# Patient Record
Sex: Female | Born: 1950 | Race: Black or African American | Hispanic: No | Smoking: Never smoker
Health system: Southern US, Community
[De-identification: ages and names within clinical notes are randomized; demographics above are authoritative.]

## PROBLEM LIST (undated history)

## (undated) DIAGNOSIS — N3281 Overactive bladder: Secondary | ICD-10-CM

## (undated) DIAGNOSIS — R809 Proteinuria, unspecified: Secondary | ICD-10-CM

## (undated) DIAGNOSIS — I10 Essential (primary) hypertension: Secondary | ICD-10-CM

## (undated) DIAGNOSIS — I82409 Acute embolism and thrombosis of unspecified deep veins of unspecified lower extremity: Secondary | ICD-10-CM

## (undated) DIAGNOSIS — I7300 Raynaud's syndrome without gangrene: Secondary | ICD-10-CM

## (undated) DIAGNOSIS — Z780 Asymptomatic menopausal state: Secondary | ICD-10-CM

## (undated) DIAGNOSIS — D6861 Antiphospholipid syndrome: Secondary | ICD-10-CM

## (undated) HISTORY — DX: Acute embolism and thrombosis of unspecified deep veins of unspecified lower extremity: I82.409

## (undated) HISTORY — DX: Overactive bladder: N32.81

## (undated) HISTORY — DX: Asymptomatic menopausal state: Z78.0

## (undated) HISTORY — DX: Essential (primary) hypertension: I10

## (undated) HISTORY — DX: Raynaud's syndrome without gangrene: I73.00

## (undated) HISTORY — DX: Antiphospholipid syndrome: D68.61

## (undated) HISTORY — DX: Proteinuria, unspecified: R80.9

---

## 1999-01-07 HISTORY — PX: TOTAL ABDOMINAL HYSTERECTOMY: SHX209

## 2007-11-30 ENCOUNTER — Ambulatory Visit: Payer: Self-pay | Admitting: Family Medicine

## 2007-11-30 DIAGNOSIS — R809 Proteinuria, unspecified: Secondary | ICD-10-CM | POA: Insufficient documentation

## 2007-11-30 DIAGNOSIS — M130 Polyarthritis, unspecified: Secondary | ICD-10-CM | POA: Insufficient documentation

## 2007-11-30 DIAGNOSIS — I10 Essential (primary) hypertension: Secondary | ICD-10-CM | POA: Insufficient documentation

## 2007-11-30 LAB — CONVERTED CEMR LAB
Ketones, urine, test strip: NEGATIVE
Nitrite: NEGATIVE
Protein, U semiquant: >=300
Urobilinogen, UA: 0.2

## 2007-12-03 ENCOUNTER — Encounter: Payer: Self-pay | Admitting: Family Medicine

## 2007-12-03 ENCOUNTER — Telehealth: Payer: Self-pay | Admitting: Family Medicine

## 2007-12-03 LAB — CONVERTED CEMR LAB
ANA Titer 1: 1:640 {titer} — ABNORMAL HIGH
CO2: 22 meq/L (ref 19–32)
Calcium: 9.8 mg/dL (ref 8.4–10.5)
Chloride: 100 meq/L (ref 96–112)
Glucose, Bld: 134 mg/dL — ABNORMAL HIGH (ref 70–99)
Potassium: 3.6 meq/L (ref 3.5–5.3)
Sodium: 140 meq/L (ref 135–145)

## 2007-12-06 DIAGNOSIS — M329 Systemic lupus erythematosus, unspecified: Secondary | ICD-10-CM | POA: Insufficient documentation

## 2007-12-07 LAB — CONVERTED CEMR LAB: ds DNA Ab: 16 — ABNORMAL HIGH (ref <5)

## 2007-12-27 ENCOUNTER — Encounter: Payer: Self-pay | Admitting: Family Medicine

## 2008-02-01 ENCOUNTER — Ambulatory Visit: Payer: Self-pay | Admitting: Family Medicine

## 2008-02-01 DIAGNOSIS — I7300 Raynaud's syndrome without gangrene: Secondary | ICD-10-CM | POA: Insufficient documentation

## 2008-02-03 LAB — CONVERTED CEMR LAB
Chloride: 104 meq/L (ref 96–112)
Potassium: 4 meq/L (ref 3.5–5.3)
Sodium: 143 meq/L (ref 135–145)

## 2008-03-20 ENCOUNTER — Telehealth (INDEPENDENT_AMBULATORY_CARE_PROVIDER_SITE_OTHER): Payer: Self-pay | Admitting: *Deleted

## 2008-03-22 ENCOUNTER — Ambulatory Visit: Payer: Self-pay | Admitting: Family Medicine

## 2008-03-22 DIAGNOSIS — R079 Chest pain, unspecified: Secondary | ICD-10-CM | POA: Insufficient documentation

## 2008-03-22 DIAGNOSIS — R002 Palpitations: Secondary | ICD-10-CM | POA: Insufficient documentation

## 2008-03-22 DIAGNOSIS — R42 Dizziness and giddiness: Secondary | ICD-10-CM | POA: Insufficient documentation

## 2008-03-23 ENCOUNTER — Encounter: Payer: Self-pay | Admitting: Family Medicine

## 2008-03-24 ENCOUNTER — Encounter: Payer: Self-pay | Admitting: Family Medicine

## 2008-03-24 LAB — CONVERTED CEMR LAB
HCT: 30 % — ABNORMAL LOW (ref 36.0–46.0)
Hemoglobin: 9.9 g/dL — ABNORMAL LOW (ref 12.0–15.0)
MCHC: 33 g/dL (ref 30.0–36.0)
MCV: 81.3 fL (ref 78.0–100.0)
RBC: 3.69 M/uL — ABNORMAL LOW (ref 3.87–5.11)
WBC: 3.7 10*3/uL — ABNORMAL LOW (ref 4.0–10.5)

## 2008-03-27 DIAGNOSIS — D649 Anemia, unspecified: Secondary | ICD-10-CM | POA: Insufficient documentation

## 2008-03-28 LAB — CONVERTED CEMR LAB
Folate: 11.8 ng/mL
Iron: 70 ug/dL (ref 42–145)
Vitamin B-12: 394 pg/mL (ref 211–911)

## 2008-04-11 ENCOUNTER — Encounter: Payer: Self-pay | Admitting: Family Medicine

## 2008-04-14 ENCOUNTER — Telehealth: Payer: Self-pay | Admitting: Family Medicine

## 2008-04-14 DIAGNOSIS — I2789 Other specified pulmonary heart diseases: Secondary | ICD-10-CM | POA: Insufficient documentation

## 2008-04-14 LAB — HM COLONOSCOPY: HM Colonoscopy: NORMAL

## 2008-04-17 ENCOUNTER — Encounter: Payer: Self-pay | Admitting: Family Medicine

## 2008-06-09 ENCOUNTER — Ambulatory Visit: Payer: Self-pay | Admitting: Family Medicine

## 2008-08-24 ENCOUNTER — Telehealth (INDEPENDENT_AMBULATORY_CARE_PROVIDER_SITE_OTHER): Payer: Self-pay | Admitting: *Deleted

## 2008-09-13 ENCOUNTER — Telehealth (INDEPENDENT_AMBULATORY_CARE_PROVIDER_SITE_OTHER): Payer: Self-pay | Admitting: *Deleted

## 2008-09-13 ENCOUNTER — Encounter: Payer: Self-pay | Admitting: Family Medicine

## 2008-09-28 ENCOUNTER — Ambulatory Visit: Payer: Self-pay | Admitting: Family Medicine

## 2008-09-28 DIAGNOSIS — L293 Anogenital pruritus, unspecified: Secondary | ICD-10-CM | POA: Insufficient documentation

## 2008-09-29 ENCOUNTER — Encounter: Payer: Self-pay | Admitting: Family Medicine

## 2008-09-29 LAB — CONVERTED CEMR LAB
Clue Cells Wet Prep HPF POC: NONE SEEN
Trich, Wet Prep: NONE SEEN

## 2008-11-29 ENCOUNTER — Ambulatory Visit: Payer: Self-pay | Admitting: Obstetrics & Gynecology

## 2008-12-12 ENCOUNTER — Encounter: Admission: RE | Admit: 2008-12-12 | Discharge: 2008-12-12 | Payer: Self-pay | Admitting: Obstetrics & Gynecology

## 2008-12-20 ENCOUNTER — Ambulatory Visit: Payer: Self-pay | Admitting: Obstetrics & Gynecology

## 2009-05-08 ENCOUNTER — Ambulatory Visit: Payer: Self-pay | Admitting: Family Medicine

## 2009-05-10 ENCOUNTER — Encounter: Payer: Self-pay | Admitting: Family Medicine

## 2009-05-10 LAB — CONVERTED CEMR LAB
ALT: 40 units/L — ABNORMAL HIGH (ref 0–35)
BUN: 16 mg/dL (ref 6–23)
Basophils Absolute: 0 10*3/uL (ref 0.0–0.1)
Basophils Relative: 1 % (ref 0–1)
CO2: 24 meq/L (ref 19–32)
Calcium: 9.5 mg/dL (ref 8.4–10.5)
Chloride: 104 meq/L (ref 96–112)
Cholesterol: 156 mg/dL (ref 0–200)
Creatinine, Ser: 1.11 mg/dL (ref 0.40–1.20)
Eosinophils Absolute: 0.1 10*3/uL (ref 0.0–0.7)
HDL: 32 mg/dL — ABNORMAL LOW (ref >39)
MCHC: 32 g/dL (ref 30.0–36.0)
MCV: 85.6 fL (ref 78.0–100.0)
Monocytes Absolute: 0.3 10*3/uL (ref 0.1–1.0)
Monocytes Relative: 8 % (ref 3–12)
Neutro Abs: 2 10*3/uL (ref 1.7–7.7)
Neutrophils Relative %: 51 % (ref 43–77)
RDW: 16 % — ABNORMAL HIGH (ref 11.5–15.5)
Sed Rate: 75 mm/hr — ABNORMAL HIGH (ref 0–22)
Total Bilirubin: 0.3 mg/dL (ref 0.3–1.2)
Total CHOL/HDL Ratio: 4.9
Triglycerides: 377 mg/dL — ABNORMAL HIGH (ref <150)
VLDL: 75 mg/dL — ABNORMAL HIGH (ref 0–40)

## 2009-05-30 ENCOUNTER — Ambulatory Visit: Payer: Self-pay | Admitting: Family Medicine

## 2009-05-30 DIAGNOSIS — M25569 Pain in unspecified knee: Secondary | ICD-10-CM | POA: Insufficient documentation

## 2009-09-13 ENCOUNTER — Ambulatory Visit: Payer: Self-pay | Admitting: Family Medicine

## 2009-09-13 DIAGNOSIS — M79609 Pain in unspecified limb: Secondary | ICD-10-CM | POA: Insufficient documentation

## 2009-09-18 ENCOUNTER — Encounter: Payer: Self-pay | Admitting: Family Medicine

## 2009-09-20 ENCOUNTER — Ambulatory Visit: Payer: Self-pay | Admitting: Family Medicine

## 2009-09-20 DIAGNOSIS — E119 Type 2 diabetes mellitus without complications: Secondary | ICD-10-CM | POA: Insufficient documentation

## 2009-09-20 DIAGNOSIS — I82409 Acute embolism and thrombosis of unspecified deep veins of unspecified lower extremity: Secondary | ICD-10-CM | POA: Insufficient documentation

## 2009-09-20 LAB — CONVERTED CEMR LAB

## 2009-09-21 ENCOUNTER — Encounter: Payer: Self-pay | Admitting: Family Medicine

## 2009-09-24 ENCOUNTER — Encounter (INDEPENDENT_AMBULATORY_CARE_PROVIDER_SITE_OTHER): Payer: Self-pay | Admitting: *Deleted

## 2009-09-24 LAB — CONVERTED CEMR LAB
Anticardiolipin IgA: 27 — ABNORMAL HIGH (ref <22)
Anticardiolipin IgG: 8 (ref <23)
Anticardiolipin IgM: 12 — ABNORMAL HIGH (ref <11)
CO2: 22 meq/L (ref 19–32)
Calcium: 8.8 mg/dL (ref 8.4–10.5)
Chloride: 99 meq/L (ref 96–112)
Glucose, Bld: 87 mg/dL (ref 70–99)
MCV: 85.3 fL (ref 78.0–100.0)
Platelets: 361 10*3/uL (ref 150–400)
Potassium: 3.7 meq/L (ref 3.5–5.3)
RBC: 3.94 M/uL (ref 3.87–5.11)
Sodium: 139 meq/L (ref 135–145)
WBC: 4.9 10*3/uL (ref 4.0–10.5)

## 2009-09-25 ENCOUNTER — Ambulatory Visit: Payer: Self-pay | Admitting: Family Medicine

## 2009-10-02 ENCOUNTER — Ambulatory Visit: Payer: Self-pay | Admitting: Family Medicine

## 2009-10-02 DIAGNOSIS — J189 Pneumonia, unspecified organism: Secondary | ICD-10-CM | POA: Insufficient documentation

## 2009-10-05 ENCOUNTER — Ambulatory Visit: Payer: Self-pay | Admitting: Family Medicine

## 2009-10-12 ENCOUNTER — Ambulatory Visit: Payer: Self-pay | Admitting: Family Medicine

## 2009-10-12 DIAGNOSIS — H9209 Otalgia, unspecified ear: Secondary | ICD-10-CM | POA: Insufficient documentation

## 2009-10-12 LAB — CONVERTED CEMR LAB: INR: 3.5

## 2009-10-15 ENCOUNTER — Encounter: Payer: Self-pay | Admitting: Family Medicine

## 2009-10-26 ENCOUNTER — Ambulatory Visit: Payer: Self-pay | Admitting: Family Medicine

## 2009-10-26 ENCOUNTER — Encounter: Admission: RE | Admit: 2009-10-26 | Discharge: 2009-10-26 | Payer: Self-pay | Admitting: Family Medicine

## 2009-10-26 DIAGNOSIS — K7689 Other specified diseases of liver: Secondary | ICD-10-CM | POA: Insufficient documentation

## 2009-10-26 LAB — CONVERTED CEMR LAB: INR: 3.5

## 2009-10-30 ENCOUNTER — Encounter: Payer: Self-pay | Admitting: Family Medicine

## 2009-11-16 ENCOUNTER — Ambulatory Visit: Payer: Self-pay | Admitting: Family Medicine

## 2009-11-16 DIAGNOSIS — R0602 Shortness of breath: Secondary | ICD-10-CM | POA: Insufficient documentation

## 2009-11-16 LAB — CONVERTED CEMR LAB: Hgb A1c MFr Bld: 6.8 %

## 2009-11-18 ENCOUNTER — Encounter: Payer: Self-pay | Admitting: Family Medicine

## 2009-11-19 ENCOUNTER — Encounter: Payer: Self-pay | Admitting: Family Medicine

## 2009-11-19 LAB — CONVERTED CEMR LAB
ALT: 26 units/L (ref 0–35)
Alkaline Phosphatase: 84 units/L (ref 39–117)
Basophils Absolute: 0 10*3/uL (ref 0.0–0.1)
Basophils Relative: 0 % (ref 0–1)
CO2: 25 meq/L (ref 19–32)
Folate: >20 ng/mL
INR: 1.76 — ABNORMAL HIGH (ref <1.50)
Lymphocytes Relative: 38 % (ref 12–46)
MCHC: 31.4 g/dL (ref 30.0–36.0)
Monocytes Relative: 8 % (ref 3–12)
Neutro Abs: 2.2 10*3/uL (ref 1.7–7.7)
Neutrophils Relative %: 52 % (ref 43–77)
Potassium: 4.2 meq/L (ref 3.5–5.3)
RBC: 3.74 M/uL — ABNORMAL LOW (ref 3.87–5.11)
Sodium: 140 meq/L (ref 135–145)
Total Bilirubin: 0.3 mg/dL (ref 0.3–1.2)
Total Protein: 9.2 g/dL — ABNORMAL HIGH (ref 6.0–8.3)

## 2009-11-20 ENCOUNTER — Ambulatory Visit: Payer: Self-pay | Admitting: Family Medicine

## 2009-11-20 DIAGNOSIS — D509 Iron deficiency anemia, unspecified: Secondary | ICD-10-CM | POA: Insufficient documentation

## 2009-11-27 ENCOUNTER — Ambulatory Visit: Payer: Self-pay | Admitting: Family Medicine

## 2009-11-27 LAB — CONVERTED CEMR LAB: INR: 2

## 2009-12-04 ENCOUNTER — Encounter: Payer: Self-pay | Admitting: Family Medicine

## 2009-12-11 ENCOUNTER — Encounter: Payer: Self-pay | Admitting: Family Medicine

## 2009-12-12 ENCOUNTER — Encounter: Payer: Self-pay | Admitting: Family Medicine

## 2009-12-12 ENCOUNTER — Ambulatory Visit: Payer: Self-pay | Admitting: Family Medicine

## 2009-12-12 DIAGNOSIS — R799 Abnormal finding of blood chemistry, unspecified: Secondary | ICD-10-CM | POA: Insufficient documentation

## 2009-12-12 DIAGNOSIS — H0520 Unspecified exophthalmos: Secondary | ICD-10-CM | POA: Insufficient documentation

## 2009-12-13 ENCOUNTER — Encounter: Payer: Self-pay | Admitting: Family Medicine

## 2009-12-14 ENCOUNTER — Encounter: Payer: Self-pay | Admitting: Family Medicine

## 2009-12-14 LAB — CONVERTED CEMR LAB
Alpha-2-Globulin: 11.9 % — ABNORMAL HIGH (ref 7.1–11.8)
Beta Globulin: 5.3 % (ref 4.7–7.2)
Gamma Globulin: 27.1 % — ABNORMAL HIGH (ref 11.1–18.8)
Hemoglobin: 9.5 g/dL — ABNORMAL LOW (ref 12.0–15.0)
TSH: 1.292 microintl units/mL (ref 0.350–4.500)

## 2009-12-17 ENCOUNTER — Encounter: Payer: Self-pay | Admitting: Family Medicine

## 2009-12-18 ENCOUNTER — Encounter
Admission: RE | Admit: 2009-12-18 | Discharge: 2009-12-18 | Payer: Self-pay | Source: Home / Self Care | Attending: Family Medicine | Admitting: Family Medicine

## 2009-12-20 LAB — HM MAMMOGRAPHY: HM Mammogram: NORMAL

## 2010-01-01 ENCOUNTER — Ambulatory Visit: Payer: Self-pay | Admitting: Family Medicine

## 2010-01-01 LAB — CONVERTED CEMR LAB: INR: 3.6

## 2010-01-10 ENCOUNTER — Ambulatory Visit
Admission: RE | Admit: 2010-01-10 | Discharge: 2010-01-10 | Payer: Self-pay | Source: Home / Self Care | Attending: Family Medicine | Admitting: Family Medicine

## 2010-01-16 ENCOUNTER — Ambulatory Visit
Admission: RE | Admit: 2010-01-16 | Discharge: 2010-01-16 | Payer: Self-pay | Source: Home / Self Care | Attending: Family Medicine | Admitting: Family Medicine

## 2010-01-18 ENCOUNTER — Telehealth (INDEPENDENT_AMBULATORY_CARE_PROVIDER_SITE_OTHER): Payer: Self-pay | Admitting: *Deleted

## 2010-01-22 ENCOUNTER — Encounter: Payer: Self-pay | Admitting: Family Medicine

## 2010-02-05 NOTE — Assessment & Plan Note (Signed)
Summary: f/u HTN/ SLE   Vital Signs:  Patient profile:   60 year old female Height:      62 inches Weight:      164 pounds BMI:     30.10 O2 Sat:      99 % on Room air Pulse rate:   82 / minute BP sitting:   96 / 65  (left arm) Cuff size:   regular  Vitals Entered By: Sherlean Foot CMA (May 08, 2009 9:59 AM)  O2 Flow:  Room air CC: F/U. Discuss meds.   Primary Care Provider:  Loyal Gambler DO  CC:  F/U. Discuss meds..  History of Present Illness: 60 yo AAF presents for f/u HTN and SLE.  She sees Dr Barkley Boards q 6 mos and goes back next wk for f/u.  She is only on as needed use of Aleve as needed joint pain.  She is doing OK with Azor + HCTZ but her pressures are running LOWER now.  She has some heart palpitations but no CP or SOB.  She is feeling more tired and lightheaded.  Denies CP , SOB or rash.  She is walking 1 hr each morning w/o joint pain.   Current Medications (verified): 1)  Hydrochlorothiazide 25 Mg Tabs (Hydrochlorothiazide) .... 1/2 Tab By Mouth Daily 2)  Azor 5-40 Mg Tabs (Amlodipine-Olmesartan) .Marland Kitchen.. 1 Tab By Mouth Daily  Allergies (verified): No Known Drug Allergies  Past History:  Past Medical History: proteinuria (Dr Tressie Ellis). OAB -- off meds  SLE (Dr Barkley Boards) HTN G3P3, post menopausal  Past Surgical History: Reviewed history from 02/01/2008 and no changes required. TAH 2001 for fibroids with 1 oophorectomy  Family History: Reviewed history from 11/30/2007 and no changes required. mother died at 8 from hemorrhage during an abortion. father died at 52, ETOH brother (older) died of colon cancer at 31 brother (younger) died of ALS at 46 2 sisters healthy 2 brothers alive - prostate cancer, cerebral palsy Gm - diabetes  Social History: Reviewed history from 11/30/2007 and no changes required. 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 Lives alone.  Has 3 grown kids - 1 daughter and 2 sons, in [place], 2  grandkids. Occas ETOH Walks 1 hr everyday  Review of Systems      See HPI  Physical Exam  General:  alert, well-developed, well-nourished, well-hydrated, and overweight-appearing.   Head:  normocephalic and atraumatic.   Eyes:  pupils equal, pupils round, and pupils reactive to light.  wears glasses Nose:  no nasal discharge.   Mouth:  pharynx pink and moist.   Neck:  no masses.   Lungs:  Normal respiratory effort, chest expands symmetrically. Lungs are clear to auscultation, no crackles or wheezes. Heart:  Normal rate and regular rhythm. S1 and S2 normal without gallop, murmur, click, rub or other extra sounds. Msk:  no joint swelling, no joint warmth, and no redness over joints.   Pulses:  2+ radial pulses Extremities:  no LE edema Skin:  color normal.   Cervical Nodes:  No lymphadenopathy noted Psych:  good eye contact, not anxious appearing, and not depressed appearing.     Impression & Recommendations:  Problem # 1:  ESSENTIAL HYPERTENSION, BENIGN (ICD-401.1) BP low normal and symptomatic.  Stop AZOR and continue HCTZ at higher dose.  Will need to monitor her proteinuria to see if this worsens off the ARB.  Update fasting labs. The following medications were removed from the medication list:  Hydrochlorothiazide 25 Mg Tabs (Hydrochlorothiazide) .Marland Kitchen... 1/2 tab by mouth daily    Azor 5-40 Mg Tabs (Amlodipine-olmesartan) .Marland Kitchen... 1 tab by mouth daily Her updated medication list for this problem includes:    Hydrochlorothiazide 25 Mg Tabs (Hydrochlorothiazide) .Marland Kitchen... 1 tab by mouth qam  Orders: T-Comprehensive Metabolic Panel ([id number])  BP today: 96/65 Prior BP: 116/71 (09/28/2008)  Labs Reviewed: K+: 4.0 (02/01/2008) Creat: : 0.72 (02/01/2008)     Problem # 2:  SLE (ICD-710.0) Update labs.  She has f/u with Dr Barkley Boards next wk.  She is only needing Aleve as needed.  She will be due for urine microalbuminuria and possibly an updated ECHO at her 6 mos f/u visit.    Orders: T-CBC w/Diff [id number]) T-Sed Rate (Automated) [id number])  Complete Medication List: 1)  Hydrochlorothiazide 25 Mg Tabs (Hydrochlorothiazide) .Marland Kitchen.. 1 tab by mouth qam  Other Orders: T-Lipid Profile [id number])  Patient Instructions: 1)  Stop AZOR and change HCTZ to a full 25 mg tab daily for BP. 2)  Dr Barkley Boards to check BP at next wk's visit. 3)  Update fasting labs. 4)  Will fax copy to Dr Barkley Boards.  5)  Will call you w/ results. 6)  Return for follow up in 6 mos. Prescriptions: HYDROCHLOROTHIAZIDE 25 MG TABS (HYDROCHLOROTHIAZIDE) 1 tab by mouth qAM  #30 x 2   Entered and Authorized by:   Loyal Gambler DO   Signed by:   Loyal Gambler DO on 05/08/2009   Method used:   Electronically to        [facility] [phone]* (retail)       [place], [place]  [address]       Ph: [phone] or [phone]       Fax: [phone]   RxID:   [id number]

## 2010-02-05 NOTE — Assessment & Plan Note (Signed)
Summary: CXR/INR   Anticoagulation Management History:      The patient is on coumadin and comes in today for a routine follow up visit.  She is being anticoagulated because of the first episode of deep venous thrombosis and/or pulmonary embolism.  Anticipated length of treatment is 6 months.  Her last INR was 3.5 and today's INR is 3.5.    Allergies: No Known Drug Allergies   Complete Medication List: 1)  Lisinopril-hydrochlorothiazide 10-12.5 Mg Tabs (Lisinopril-hydrochlorothiazide) .Marland Kitchen.. 1 tab by mouth daily 2)  Metformin Hcl 500 Mg Tabs (Metformin hcl) .Marland Kitchen.. 1 tab by mouth bid 3)  One Touch Ultra 2 Test Strips  .... Use 2 x a day as directed 4)  Mucinex 600 Mg Xr12h-tab (Guaifenesin) .... 2 tabs by mouth q 12 hrs for cough 5)  Antipyrine-benzocaine 5.4-1.4 % Soln (Benzocaine-antipyrine) .... 2-4 drops in the left ear 4 x a day as needed for ear pain 6)  Coumadin 5 Mg Tabs (Warfarin sodium) .... Sunday - 5 mg, monday - 5 mg, tuesday - 7.5 mg, wednesday - 5 mg, thursday - 5 mg, friday - 5 mg, saturday - 5 mg 7)  Coumadin 1 Mg Tabs (Warfarin sodium) .... Sunday - 5 mg, monday - 5 mg, tuesday - 7.5 mg, wednesday - 5 mg, thursday - 5 mg, friday - 5 mg, saturday - 5 mg  Other Orders: T-DG Chest 2 View (71020) Fingerstick [id number]) Protime INR (29562)  Anticoagulation Management Assessment/Plan:      The target INR is 2.0-3.0.  She is to have a PT/INR in 2 weeks.  Anticoagulation instructions were given to patient.         Current Anticoagulation Instructions: The patient's dosage of coumadin will be decreased.  The new dosage includes:   Coumadin 5 mg tabs and Coumadin 1 mg tabs:  Sunday - 5 mg, Monday - 5 mg, Tuesday - 7.5 mg, Wednesday - 5 mg, Thursday - 5 mg, Friday - 5 mg, Saturday - 5 mg.    Repeat PT/INR in 2 weeks.    Patient Instructions: 1)  Change coumadin dose. 2)  CXR today. 3)  Will call you w/ result on Monday. 4)  Return for Office visit for diabetes/ INR in 3  wks.   Orders Added: 1)  T-DG Chest 2 View [[id number]] 2)  Fingerstick [[id number]] 3)  Protime INR [[id number]]    Laboratory Results   Blood Tests   Date/Time Received: 10/26/2009 Date/Time Reported: 10/26/2009   INR: 3.5   (Normal Range: 0.88-1.12   Therap INR: 2.0-3.5)

## 2010-02-05 NOTE — Assessment & Plan Note (Signed)
Summary: f/u SOB   Vital Signs:  Patient profile:   60 year old female Height:      62 inches Weight:      150 pounds BMI:     27.53 O2 Sat:      97 % on Room air Temp:     98.6 degrees F oral Pulse rate:   90 / minute BP sitting:   93 / 63  (left arm) Cuff size:   regular  Vitals Entered By: Sherlean Foot CMA (November 20, 2009 1:23 PM)  O2 Flow:  Room air CC: F/U.    Primary Care Provider:  Loyal Gambler DO  CC:  F/U. Marland Kitchen  History of Present Illness: 60 yo AAF presents for f/u SOB from last wk.  The only thing that came back abnormal was her Hgb of 9.4 which she says she has had in the past.  She had a normal colonoscopy last year.  She has waxing and waning problems with fatigue and SOB.  We did not do anything to treat her SOB from last visit but she is feeling better.  She is somewhat anxious.  She had been walking 30 min for exercise each morning and has been feeling well.    I changed her Metformin to Januvia last visit because of mild renal impairment.  Sugars are unchanged and 'good' per her report.  Her BNP and D -dimer were normal last visit.  Her total protein was slightly high.        Allergies: No Known Drug Allergies  Past History:  Past Medical History: proteinuria (Dr Tressie Ellis). 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 TAH 2001 for fibroids with 1 oophorectomy  Family History: Reviewed history from 11/30/2007 and no changes required. mother died at 45 from hemorrhage during an abortion. father died at 15, ETOH brother (older) died of colon cancer at 79 brother (younger) died of ALS at 64 2 sisters healthy 2 brothers alive - prostate cancer, cerebral palsy Gm - diabetes  Social History: Reviewed history from 11/30/2007 and no  changes required. 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 Lives alone.  Has 3 grown kids - 1 daughter and 2 sons, in [place], 2 grandkids. Occas ETOH Walks 1 hr everyday  Review of Systems      See HPI  Physical Exam  General:  alert, well-developed, well-nourished, and well-hydrated.   Head:  normocephalic and atraumatic.   Eyes:  sclera non icteric Mouth:  pharynx pink and moist.   Neck:  no masses.   Lungs:  Normal respiratory effort, chest expands symmetrically. Lungs are clear to auscultation, no crackles or wheezes. Heart:  normal rate, regular rhythm, and no murmur.   Extremities:  no UE or LE edema Skin:  color normal.   Psych:  good eye contact, not anxious appearing, and not depressed appearing.     Impression & Recommendations:  Problem # 1:  DYSPNEA (ICD-786.05) Assessment Improved Improved with a w/u that was negative other than anemia with a hgb of 9.4 which could defintely cause her SOB.  Since it comes and goes and is not necessarily exertional, there could be an anxiety component.    Problem # 2:  ANEMIA, IRON DEFICIENCY (ICD-280.9) Advised starting OTC iron once dialy  for borderline low iron with a microcytic, hypochromic picture, hx of iron def. anemia and a normal colonoscopy last year.    Recheck Hgb/ iron in 2 mos.  Problem # 3:  SLE (ICD-710.0) She has appt with dr Franki Monte, transfering care from dr Barkley Boards and not on any meds.  Also has Raynauds.  It appears that her anemia, CKD and elevated total protein may be related to her SLE.  She has seen neprhology in the past ( dr Tressie Ellis) and may need to return for f/u.    Problem # 4:  DIABETES MELLITUS, TYPE II (ICD-250.00) Doing well with change to Januvia from Metformin with good home sugar readings. Not due for A1C.  This may help improve her renal function some. Her updated medication list for this problem includes:    Januvia 50 Mg Tabs (Sitagliptin phosphate) .Marland Kitchen... 1 tab  by mouth daily  Labs Reviewed: Creat: 1.18 (11/16/2009)   Microalbumin: 150 (09/20/2009) Reviewed HgBA1c results: 6.8 (11/16/2009)  Problem # 5:  ESSENTIAL HYPERTENSION, BENIGN (ICD-401.1) BP is low.  I am going to stop the ACEI and keep her on plain HCTZ daily.  REcheck in 2 mos. Her updated medication list for this problem includes:    Hydrochlorothiazide 12.5 Mg Caps (Hydrochlorothiazide) .Marland Kitchen... 1 capsule by mouth daily  BP today: 93/63 Prior BP: 104/64 (11/16/2009)  Labs Reviewed: K+: 4.2 (11/16/2009) Creat: : 1.18 (11/16/2009)   Chol: 156 (05/10/2009)   HDL: 32 (05/10/2009)   LDL: 49 (05/10/2009)   TG: 377 (05/10/2009)  Complete Medication List: 1)  Hydrochlorothiazide 12.5 Mg Caps (Hydrochlorothiazide) .Marland Kitchen.. 1 capsule by mouth daily 2)  One Touch Ultra 2 Test Strips  .... Use 2 x a day as directed 3)  Mucinex 600 Mg Xr12h-tab (Guaifenesin) .... 2 tabs by mouth q 12 hrs for cough 4)  Antipyrine-benzocaine 5.4-1.4 % Soln (Benzocaine-antipyrine) .... 2-4 drops in the left ear 4 x a day as needed for ear pain 5)  Januvia 50 Mg Tabs (Sitagliptin phosphate) .Marland Kitchen.. 1 tab by mouth daily 6)  Coumadin 5 Mg Tabs (Warfarin sodium) .... Sunday - 5 mg, monday - 5 mg, tuesday - 7.5 mg, wednesday - 5 mg, thursday - 5 mg, friday - 7.5 mg, saturday - 5 mg 7)  Coumadin 1 Mg Tabs (Warfarin sodium) .... Sunday - 5 mg, monday - 5 mg, tuesday - 7.5 mg, wednesday - 5 mg, thursday - 5 mg, friday - 7.5 mg, saturday - 5 mg  Patient Instructions: 1)  F/U with Dr Bravo for LUPUS. 2)  Start IRON (OTC) 1 tab each evening for iron deficiency anemia. 3)  Change Lisinopril/ HCTZ to plain HCTZ once daily for BP. 4)  Next INR in 1 week. 5)  Return for follow up BP/DM in 3 mos. Prescriptions: HYDROCHLOROTHIAZIDE 12.5 MG CAPS (HYDROCHLOROTHIAZIDE) 1 capsule by mouth daily  #30 x 3   Entered and Authorized by:   Karen Bowen DO   Signed by:   Karen Bowen DO on 11/20/2009   Method used:   [facility]ron[facility] to        [facility]   [address] [facility] (retail)       [address], [place]  [address]       Ph: [phone] or [phone]       Fax: [phone]   RxID:   [id number]    Orders Added: 1)  Est. Patient Level IV [id number]

## 2010-02-05 NOTE — Assessment & Plan Note (Signed)
Summary: Coumadin Check - jr  Nurse Visit   Allergies: No Known Drug Allergies Laboratory Results   Blood Tests   Date/Time Received: 09/25/09 Date/Time Reported: 09/25/09   INR: 3.1   (Normal Range: 0.88-1.12   Therap INR: 2.0-3.5)    Orders Added: 1)  Fingerstick [[id number]] 2)  Protime INR [85610]    Anticoagulation Management History:      The patient is on coumadin and comes in today for a routine follow up visit.  She is being anticoagulated because of the first episode of deep venous thrombosis and/or pulmonary embolism.  Anticipated length of treatment is 6 months.  Her last INR was 1.9 and today's INR is 3.1.    Anticoagulation Management Assessment/Plan:      The target INR is 2.0-3.0.  She is to have a PT/INR in 1 week.  Anticoagulation instructions were given to patient.         Current Anticoagulation Instructions: The patient's dosage of coumadin will be decreased.  The new dosage includes:   Coumadin 5 mg tabs: Take 1 1/2 tabs by mouth once daily Coumadin 7.5 mg tabs and Coumadin 1 mg tabs:  Sunday - 7.5 mg, Monday - 8.5 mg, Tuesday - 7.5 mg, Wednesday - 7.5 mg, Thursday - 7.5 mg, Friday - 7.5 mg, Saturday - 7.5 mg.    Repeat PT/INR in 1 week.

## 2010-02-05 NOTE — Assessment & Plan Note (Signed)
Summary: ED f/u for PNA   Vital Signs:  Patient profile:   60 year old female Height:      62 inches Weight:      150 pounds BMI:     27.53 O2 Sat:      95 % on Room air Temp:     98.8 degrees F oral Pulse rate:   90 / minute BP sitting:   117 / 79  (left arm) Cuff size:   regular  Vitals Entered By: Sherlean Foot CMA (October 05, 2009 2:31 PM)  O2 Flow:  Room air CC: Hosp f/u.   Primary Care Saeed Toren:  Loyal Gambler DO  CC:  Hosp f/u.Marland Kitchen  History of Present Illness: 60 yo AAF presents for ED f/u.  She was shipped to [facility] ED via EMS from this office on 10/02/09 and was diagnosed with pneumonia of the lingula and LLL.  She was treated with Rocephin x 1 and 10 days of Zithromax which she is still taking.  She is feeling better.  Her breathing has improved.  She a cough but is not getting anything up. She has chest pressure with leaning forward.  She has been very tired and has poor appeitie.  Has been holding coumadin x 3 days due to INR of 4.5.      Anticoagulation Management History:      The patient is on coumadin and comes in today for a routine follow up visit.  She is being anticoagulated because of the first episode of deep venous thrombosis and/or pulmonary embolism.  Anticipated length of treatment is 6 months.  Her last INR was 3.1 and today's INR is 2.6.    Current Medications (verified): 1)  Hydrochlorothiazide 25 Mg Tabs (Hydrochlorothiazide) .Marland Kitchen.. 1 Tab By Mouth Qam 2)  Hydrocodone-Acetaminophen 5-500 Mg Tabs (Hydrocodone-Acetaminophen) .Marland Kitchen.. 1 Tab By Mouth Three Times A Day As Needed Severe Knee Pain, Take With Food 3)  Coumadin 5 Mg Tabs (Warfarin Sodium) .... Take 1 1/2 Tabs By Mouth Once Daily 4)  Metformin Hcl 500 Mg Tabs (Metformin Hcl) .Marland Kitchen.. 1 Tab By Mouth Bid 5)  One Touch Ultra 2 Test Strips .... Use 2 X A Day As Directed 6)  Coumadin 7.5 Mg Tabs (Warfarin Sodium) .... Sunday - 7.5 Mg, Monday - 8.5 Mg, Tuesday - 7.5 Mg, Wednesday - 7.5 Mg, Thursday - 7.5 Mg,  Friday - 7.5 Mg, Saturday - 7.5 Mg 7)  Coumadin 1 Mg Tabs (Warfarin Sodium) .... Sunday - 7.5 Mg, Monday - 8.5 Mg, Tuesday - 7.5 Mg, Wednesday - 7.5 Mg, Thursday - 7.5 Mg, Friday - 7.5 Mg, Saturday - 7.5 Mg  Allergies (verified): No Known Drug Allergies  Past History:  Past Medical History: Reviewed history from 10/02/2009 and no changes required. proteinuria (Dr Tressie Ellis). OAB -- off meds  SLE (Dr Barkley Boards) HTN G3P3, post menopausal DVT   Social History: Reviewed history from 11/30/2007 and no changes required. 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 Lives alone.  Has 3 grown kids - 1 daughter and 2 sons, in [place], 2 grandkids. Occas ETOH Walks 1 hr everyday  Review of Systems      See HPI  Physical Exam  General:  alert and well-developed.  in NAD Head:  normocephalic and atraumatic.   Eyes:  sclera non icteric Mouth:  pharynx pink and moist.   Neck:  no masses.   Chest Wall:  no tenderness.   Lungs:  decreased BS over  the L base, nonlabored.  no longer tachypneic but has splinting with deep inspiration.  no cough.  No rhonchi or wheezes Heart:  normal rate, regular rhythm, and no murmur.   Extremities:  no UE or LE edema Skin:  color normal.   Cervical Nodes:  No lymphadenopathy noted Psych:  good eye contact and flat affect.     Impression & Recommendations:  Problem # 1:  PNEUMONIA, ORGANISM UNSPECIFIED (ICD-486) Pt was in the ED 10-02-09 with LLL and lingular Pneumonia, confirmed on CT scan. She is no longer having problems breathing.  She is afebrile today with stable BP. She will finish out her 10 days of Zithromax.  Add plain Mucinex q 12 hrs as a mucolytic. Clear fluids, rest. RTC in 2 wks. CXR in 4 wks.   Call if any clinical worsening.  Problem # 2:  DVT (ICD-453.40) Adjusted coumadin down.  Repeat INR in 1 wk. 4.5--> 2.6 Orders: Fingerstick [id number]) Protime INR (02725)  Complete Medication List: 1)   Lisinopril-hydrochlorothiazide 10-12.5 Mg Tabs (Lisinopril-hydrochlorothiazide) .Marland Kitchen.. 1 tab by mouth daily 2)  Metformin Hcl 500 Mg Tabs (Metformin hcl) .Marland Kitchen.. 1 tab by mouth bid 3)  One Touch Ultra 2 Test Strips  .... Use 2 x a day as directed 4)  Coumadin 5 Mg Tabs (Warfarin sodium) .Marland Kitchen.. 1 tab daily as directed 5)  Coumadin 1 Mg Tabs (Warfarin sodium) .... "Sunday - 7.5 mg, monday - 5 mg, tuesday - 7.5 mg, wednesday - 7.5 mg, thursday - 7.5 mg, friday - 5 mg, saturday - 7.5 mg 6)  Mucinex 600 Mg Xr12h-tab (Guaifenesin) .... 2 tabs by mouth q 12 hrs for cough  Anticoagulation Management Assessment/Plan:      The target INR is 2.0-3.0.  She is to have a PT/INR in 1 week.  Anticoagulation instructions were given to patient.         Current Anticoagulation Instructions: The patient's dosage of coumadin will be decreased.  The new dosage includes:   Coumadin 5 mg tabs: Take 1 1/2 tabs by mouth once daily Coumadin 5 mg tabs and Coumadin 1 mg tabs:  Sunday - 7.5 mg, Monday - 5 mg, Tuesday - 7.5 mg, Wednesday - 7.5 mg, Thursday - 7.5 mg, Friday - 5 mg, Saturday - 7.5 mg.      Repeat PT/INR in 1 week.    Patient Instructions: 1)  Change HCTZ to Lisinopril- HCTZ. 2)  Recheck BMP in 2 wks. 3)  Adjust Coumadin dose. 4)  Recheck in 1 wk. 5)  Finish out antibiotics. 6)  Add plain Mucinex (OTC) 1-2 tabs every 12 hrs to loosen chest congestion. 7)  Call if any problems. 8)  Return for follow up visit in 2 wks/ Nurse visit in 1 wk. Prescriptions: COUMADIN 5 MG TABS (WARFARIN SODIUM) 1 tab daily as directed  #30 x 0   Entered and Authorized by:   Karen Bowen DO   Signed by:   Karen Bowen DO on 10/05/2009   Method used:   [facility]  [address] [id number]       [address] [place], [place]  [address]       Ph: [phone] or [phone]       Fax: [phone]   RxID:   [id number] LISINOPRIL-HYDROCHLOROTHIAZIDE 10-12.5 MG TABS (LISINOPRIL-HYDROCHLOROTHIAZIDE)  1 tab by mouth daily  #30 x 3   Entered and Authorized by:  Loyal Gambler DO   Signed by:   Loyal Gambler DO on 10/05/2009   Method used:   Electronically to        [facility] [phone]* (retail)       [address] [place], [place]  [address]       Ph: [phone] or [phone]       Fax: [phone]   RxID:   [id number]   Laboratory Results   Blood Tests      INR: 2.6   (Normal Range: 0.88-1.12   Therap INR: 2.0-3.5)

## 2010-02-05 NOTE — Letter (Signed)
Summary: [facility]   Imported By: Edmonia James 10/02/2009 10:50:02  _____________________________________________________________________  External Attachment:    Type:   Image     Comment:   External Document

## 2010-02-05 NOTE — Miscellaneous (Signed)
Summary: stress echo: normal  Clinical Lists Changes  Observations: Added new observation of ETTECHOFIND: LV normal size with EF 60-65% LV wall thickness normal neg for inducible ischemia  done at [facility] cardiology  METS: 7 (12/11/2009 12:52)      Stress Echocardiogram  Procedure date:  12/11/2009  Findings:      LV normal size with EF 60-65% LV wall thickness normal neg for inducible ischemia  done at [facility] cardiology  METS: 7   Stress Echocardiogram  Procedure date:  12/11/2009  Findings:      LV normal size with EF 60-65% LV wall thickness normal neg for inducible ischemia  done at [facility] cardiology  METS: 7  Appended Document: stress echo: normal Pls let pt know that her stress echo looked completely normal.  Loyal Gambler, D.O.  Appended Document: stress echo: normal Pt aware of the above

## 2010-02-05 NOTE — Assessment & Plan Note (Signed)
Summary: SOB/ DM   Vital Signs:  Patient profile:   60 year old female Height:      62 inches Weight:      148 pounds BMI:     27.17 O2 Sat:      98 % on Room air Temp:     98.2 degrees F oral Pulse rate:   93 / minute BP sitting:   104 / 64  (left arm) Cuff size:   regular  Vitals Entered By: Sherlean Foot CMA (November 16, 2009 11:20 AM)  O2 Flow:  Room air CC: F/U DM and INR. Also c/o cough x 1 week.   Primary Care Arayah Krouse:  Loyal Gambler DO  CC:  F/U DM and INR. Also c/o cough x 1 week.Joy Anderson  History of Present Illness: 60 yo AAF presents for a cough and fatigue that started 4 days ago.  She feels a little SOB and has L chest pain with coughing but it doesn't feel like it did with her recent pneumonia.  She is getting resting chest tightness in the upper chest.  She denies fevers or chills.  She feels congested.   She is due for INR and A1C.   Her last CR was 1.6 and she is due to repeat this.  She is on an ACEi and Metformin which may need to be adjusted.   Anticoagulation Management History:      She is being anticoagulated because of the first episode of deep venous thrombosis and/or pulmonary embolism.  Anticipated length of treatment is 6 months.  Her last INR was 3.5.    Current Medications (verified): 1)  Lisinopril-Hydrochlorothiazide 10-12.5 Mg Tabs (Lisinopril-Hydrochlorothiazide) .Joy Anderson.. 1 Tab By Mouth Daily 2)  Metformin Hcl 500 Mg Tabs (Metformin Hcl) .Joy Anderson.. 1 Tab By Mouth Bid 3)  One Touch Ultra 2 Test Strips .... Use 2 X A Day As Directed 4)  Mucinex 600 Mg Xr12h-Tab (Guaifenesin) .... 2 Tabs By Mouth Q 12 Hrs For Cough 5)  Antipyrine-Benzocaine 5.4-1.4 % Soln (Benzocaine-Antipyrine) .... 2-4 Drops in The Left Ear 4 X A Day As Needed For Ear Pain 6)  Coumadin 5 Mg Tabs (Warfarin Sodium) .... Sunday - 5 Mg, Monday - 5 Mg, Tuesday - 7.5 Mg, Wednesday - 5 Mg, Thursday - 5 Mg, Friday - 5 Mg, Saturday - 5 Mg 7)  Coumadin 1 Mg Tabs (Warfarin Sodium) .... Sunday - 5 Mg,  Monday - 5 Mg, Tuesday - 7.5 Mg, Wednesday - 5 Mg, Thursday - 5 Mg, Friday - 5 Mg, Saturday - 5 Mg  Allergies (verified): No Known Drug Allergies  Past History:  Past Medical History: Reviewed history from 10/26/2009 and no changes required. proteinuria (Dr Tressie Ellis). OAB -- off meds  SLE (Dr Barkley Boards)-- wants to change Raynauds HTN G3P3, post menopausal DVT --anti phospholipid antibody + adrenal nodules on CT with pleural thickening 09-2009  Social History: Reviewed history from 11/30/2007 and no changes required. 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 Lives alone.  Has 3 grown kids - 1 daughter and 2 sons, in [place], 2 grandkids. Occas ETOH Walks 1 hr everyday  Review of Systems      See HPI  Physical Exam  General:  alert, well-developed, and well-nourished.  in mild distress Head:  normocephalic and atraumatic.  sinuses NTTP Eyes:  conjunctiva clear; wears glasses Nose:  no nasal discharge.   Mouth:  pharynx pink and moist.   Neck:  no masses.  Chest Wall:  non reproducilbe chest wall TTP Lungs:  splinting with deep inspriation o/w CTA w/o W/R/R RR 18/ min without use of acces muscles Heart:  normal rate, regular rhythm, and no murmur.   Abdomen:  soft, non-tender, no distention, and no guarding.   Extremities:  no UE or LE edema Skin:  color normal.   Cervical Nodes:  No lymphadenopathy noted Psych:  good eye contact, not anxious appearing, and not depressed appearing.     Impression & Recommendations:  Problem # 1:  DYSPNEA (ICD-786.05) Chest walll pain with splinting and objective dyspnea/ tachypnea-- mild. Recently had PNA which did resolve but she is on coumadin so I am concerned about a PE. Will get a state D Dimer, BNP and CBC today. F/U results this afternoon.  Will send to the hospital if D Dimer is elevated.   EKG done today shows NSR with HR 96/ min with no ST seg changes or Q vwaves.  QTC 416 ms. Orders: T-D-Dimer  Fibrin Derivatives Quantitive [id number]) T-BNP  (B Natriuretic Peptide) [id number]) T-CBC w/Diff [id number]) EKG w/ Interpretation (93000)  Problem # 2:  DVT (ICD-453.40) Unable to get her PT in the office so sent to the lab.  Concern for PE if subtherapeutic. Started anticoagulation 9-8 and has antiphospholipid ab syndrome.   Orders: T-Protime, Auto [id number])  Problem # 3:  ESSENTIAL HYPERTENSION, BENIGN (ICD-401.1)  Her updated medication list for this problem includes:    Lisinopril-hydrochlorothiazide 10-12.5 Mg Tabs (Lisinopril-hydrochlorothiazide) .Joy Anderson... 1 tab by mouth daily  BP today: 104/64 Prior BP: 104/72 (10/12/2009)  Labs Reviewed: K+: 3.7 (09/21/2009) Creat: : 1.60 (09/21/2009)   Chol: 156 (05/10/2009)   HDL: 32 (05/10/2009)   LDL: 49 (05/10/2009)   TG: 377 (05/10/2009)  Problem # 4:  DIABETES MELLITUS, TYPE II (ICD-250.00) Repeat renal function on labs today but after review of old records, her last Cr was 1.6 and she has SLE. I stopped her Metformin today and will keep an eye on use of ACEi with renal function, rechecked today.  Change to Januvia, 50 mg/ day. The following medications were removed from the medication list:    Metformin Hcl 500 Mg Tabs (Metformin hcl) .Joy Anderson... 1 tab by mouth bid Her updated medication list for this problem includes:    Lisinopril-hydrochlorothiazide 10-12.5 Mg Tabs (Lisinopril-hydrochlorothiazide) .Joy Anderson... 1 tab by mouth daily    Januvia 50 Mg Tabs (Sitagliptin phosphate) .Joy Anderson... 1 tab by mouth daily  Orders: Fingerstick ([id number]) Hemoglobin A1C (83036)  Labs Reviewed: Creat: 1.60 (09/21/2009)   Microalbumin: 150 (09/20/2009) Reviewed HgBA1c results: 6.8 (11/16/2009)  Complete Medication List: 1)  Lisinopril-hydrochlorothiazide 10-12.5 Mg Tabs (Lisinopril-hydrochlorothiazide) .Joy Anderson.. 1 tab by mouth daily 2)  One Touch Ultra 2 Test Strips  .... Use 2 x a day as directed 3)  Mucinex 600 Mg Xr12h-tab (Guaifenesin) .... 2 tabs by  mouth q 12 hrs for cough 4)  Antipyrine-benzocaine 5.4-1.4 % Soln (Benzocaine-antipyrine) .... 2-4 drops in the left ear 4 x a day as needed for ear pain 5)  Coumadin 5 Mg Tabs (Warfarin sodium) .... Sunday - 5 mg, monday - 5 mg, tuesday - 7.5 mg, wednesday - 5 mg, thursday - 5 mg, friday - 5 mg, saturday - 5 mg 6)  Coumadin 1 Mg Tabs (Warfarin sodium) .... Sunday - 5 mg, monday - 5 mg, tuesday - 7.5 mg, wednesday - 5 mg, thursday - 5 mg, friday - 5 mg, saturday - 5 mg 7)  Januvia 50 Mg Tabs (Sitagliptin phosphate) .Joy KitchenMarland KitchenMarland Anderson 1  tab by mouth daily  Other Orders: T-Comprehensive Metabolic Panel ([id number])  Patient Instructions: 1)  Labs downstairs today. 2)  Will call you w/ results this afternoon. 3)  If you have any chest pain or trouble breathing, go to the Emergency room (call 911). 4)  Change Metformin to Januvia 50 mg/ day. 5)  Return for follow up on Tuesday. Prescriptions: JANUVIA 50 MG TABS (SITAGLIPTIN PHOSPHATE) 1 tab by mouth daily  #30 x 3   Entered and Authorized by:   Loyal Gambler DO   Signed by:   Loyal Gambler DO on 11/16/2009   Method used:   Electronically to        [facility] [phone]* (retail)       [place], [place]  [address]       Ph: [phone] or [phone]       Fax: [phone]   RxID:   [id number]    Orders Added: 1)  Fingerstick [36416] 2)  Hemoglobin A1C [83036] 3)  T-Protime, Auto [id number] 4)  T-D-Dimer Fibrin Derivatives Quantitive [id number] 5)  T-BNP  (B Natriuretic Peptide) [[id number]] 6)  T-CBC w/Diff [id number] 7)  T-Comprehensive Metabolic Panel [id number] 8)  Est. Patient Level IV [id number] 9)  EKG w/ Interpretation [93000]    Laboratory Results   Blood Tests     HGBA1C: 6.8%   (Normal Range: Non-Diabetic - 3-6%   Control Diabetic - 6-8%)

## 2010-02-05 NOTE — Assessment & Plan Note (Signed)
Summary: HFU DVT/ DM   Vital Signs:  Patient profile:   60 year old female Height:      62 inches Weight:      154 pounds BMI:     28.27 O2 Sat:      100 % on Room air Pulse rate:   93 / minute BP sitting:   116 / 79  (left arm) Cuff size:   regular  Vitals Entered By: Sherlean Foot CMA (September 20, 2009 10:04 AM)  O2 Flow:  Room air CC: Hosp f/u.    History of Present Illness: 60 yo AAF presents for HFU visit.  At our last visit, she tested + for a LLE DVT.  She was sent to the hospital immediately after I recieved the results of her LE doppler study.  While in the hospital, she was started on Lovenox and coumadin, discharged home on the 12th -- on both.  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  She has never had a clot, including during pregnancy and denies a hx of miscarriage, though she also has SLE.  She has only been on 3 bursts of prednisone in the past 2 yrs.  She was started on Metformin 500 mg just once a day with preliminary education on diabetic diet.  She is tolerating meds well and is getting used to checking her sugars.  AM fasting 130s, 2 hr PPs 140s.  She feels well though still has some L calf pain with walking.  No leg swelling, SOB or bleeding/ bruising problems.  She had findings of fatty liver dz on a CT scan.  She was also put on K+ which needs to be rechecked.    Anticoagulation Management History:      The patient is on coumadin and comes in today for a routine follow up visit.  She is being anticoagulated because of the first episode of deep venous thrombosis and/or pulmonary embolism.  Anticipated length of treatment is 6 months.  Today's INR is 1.9.    Current Medications (verified): 1)  Hydrochlorothiazide 25 Mg Tabs (Hydrochlorothiazide) .Marland Kitchen.. 1 Tab By Mouth Qam 2)  Hydrocodone-Acetaminophen 5-500 Mg Tabs (Hydrocodone-Acetaminophen) .Marland Kitchen.. 1 Tab By Mouth Three Times A Day As Needed Severe Knee Pain, Take With Food 3)  Coumadin 5  Mg Tabs (Warfarin Sodium) .... Take 1 1/2 Tabs By Mouth Once Daily 4)  Lovenox 100 Mg/ml Soln (Enoxaparin Sodium) .... Inj 70mg  Two Times A Day 5)  Glucophage 500 Mg Tabs (Metformin Hcl) .... Take 1 Tab By Mouth Once Daily  Allergies (verified): No Known Drug Allergies  Past History:  Past Medical History: Reviewed history from 05/08/2009 and no changes required. proteinuria (Dr Tressie Ellis). OAB -- off meds  SLE (Dr Barkley Boards) HTN G3P3, post menopausal  Past Surgical History: Reviewed history from 02/01/2008 and no changes required. TAH 2001 for fibroids with 1 oophorectomy  Family History: Reviewed history from 11/30/2007 and no changes required. mother died at 35 from hemorrhage during an abortion. father died at 34, ETOH brother (older) died of colon cancer at 66 brother (younger) died of ALS at 11 2 sisters healthy 2 brothers alive - prostate cancer, cerebral palsy Gm - diabetes  Social History: Reviewed history from 11/30/2007 and no changes required. 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 Lives alone.  Has 3 grown kids - 1 daughter and 2 sons, in [place], 2 grandkids. Occas ETOH Walks 1 hr  everyday  Review of Systems      See HPI  Physical Exam  General:  alert, well-developed, well-nourished, and well-hydrated.   Head:  normocephalic and atraumatic.   Eyes:  pupils equal, pupils round, and pupils reactive to light.   Mouth:  pharynx pink and moist.   Neck:  no masses.   Lungs:  Normal respiratory effort, chest expands symmetrically. Lungs are clear to auscultation, no crackles or wheezes. Heart:  normal rate, regular rhythm, no murmur, no gallop, and no rub.   Msk:  tender L calf with increased edema compared to the R side and a palpable popliteal cord Extremities:  no pedal edema Neurologic:  antalgic gait Skin:  color normal.   Cervical Nodes:  No lymphadenopathy noted Psych:  good eye contact, not anxious appearing, and not  depressed appearing.     Impression & Recommendations:  Problem # 1:  DVT (ICD-453.40) Assessment New Increased coumadin dose today.  Recheck Tuesday.  Stop Lovenox after FRI doses are complete. Will check for APA syndrome and Factor V Leiden def.  Will need to complete coumadin to complete hypercoag w/u. No know precipitating factor for her DVT.  Can start use of TED hose for calf pain.    Orders: Fingerstick [id number]) Protime INR (02725) T-Antiphospholipid Syndrome Eval (85730/[id number]) T-Factor V Mutation, Leiden (83890/[id number]) T-CBC No Diff [id number])  Problem # 2:  DIABETES MELLITUS, TYPE II (ICD-250.00) Assessment: New Discussed her new dx.  Set up for nutrition classes.  Increase Metformin to two times a day from once a day.  Declined flu shot.  Will need to check on PNX status.  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    Will update her eye exam and monofilament at f/u visit. Start checking sugars 2 x a day, goals given to pt.   A1C in the hosp 7.8. Her updated medication list for this problem includes:    Metformin Hcl 500 Mg Tabs (Metformin hcl) .Marland Kitchen... 1 tab by mouth bid  Orders: Nutrition Referral (Nutrition) T-Basic Metabolic Panel ([id number]) Urine Microalbumin [id number])  Problem # 3:  PROTEINURIA (ICD-791.0) Umicro + but has a hx of proteinuria likely from SLE and HTN and not from DM since this is new. Has seen dr Tressie Ellis in the past.  May need to contact him about f/u and adding an ACEi or ARB.  Problem # 4:  ESSENTIAL HYPERTENSION, BENIGN (ICD-401.1) BP at goal.  Will check her BMP today to see if it would be OK to start her on an ACEi or ARB in combo with her HCTZ for added renal protection. Her updated medication list for this problem includes:    Hydrochlorothiazide 25 Mg Tabs (Hydrochlorothiazide) .Marland Kitchen... 1 tab by mouth qam  BP today: 116/79 Prior BP: 122/74 (09/13/2009)  Labs Reviewed: K+: 4.4 (05/10/2009) Creat: : 1.11 (05/10/2009)   Chol: 156  (05/10/2009)   HDL: 32 (05/10/2009)   LDL: 49 (05/10/2009)   TG: 377 (05/10/2009)  Complete Medication List: 1)  Hydrochlorothiazide 25 Mg Tabs (Hydrochlorothiazide) .Marland Kitchen.. 1 tab by mouth qam 2)  Hydrocodone-acetaminophen 5-500 Mg Tabs (Hydrocodone-acetaminophen) .Marland Kitchen.. 1 tab by mouth three times a day as needed severe knee pain, take with food 3)  Coumadin 5 Mg Tabs (Warfarin sodium) .... Take 1 1/2 tabs by mouth once daily 4)  Lovenox 100 Mg/ml Soln (Enoxaparin sodium) .... Inj 70mg  two times a day 5)  Metformin Hcl 500 Mg Tabs (Metformin hcl) .Marland Kitchen.. 1 tab by mouth bid 6)  One Touch Ultra 2  Test Strips  .... Use 2 x a day as directed 7)  Coumadin 7.5 Mg Tabs (Warfarin sodium) .... "Sunday - 7.5 mg, monday - 8.5 mg, tuesday - 7.5 mg, wednesday - 7.5 mg, thursday - 7.5 mg, friday - 8.5 mg, saturday - 7.5 mg 8)  Coumadin 1 Mg Tabs (Warfarin sodium) .... Sunday - 7.5 mg, monday - 8.5 mg, tuesday - 7.5 mg, wednesday - 7.5 mg, thursday - 7.5 mg, friday - 8.5 mg, saturday - 7.5 mg  Anticoagulation Management Assessment/Plan:      The target INR is 2.0-3.0.  Anticoagulation instructions were given to patient.         Current Anticoagulation Instructions: The patient's dosage of coumadin will be increased.  The new dosage includes:   Recheck INR on TUESDAY.   Patient Instructions: 1)  Increase Metformin (glucophage ) to 500 mg two times a day. 2)  AM fasting sugar goal is 80-110. 3)  2 hr after dinner sugar goal is <150. 4)  Nutrition referral placed. 5)  Pick up a pair of TED hose from the pharmacy and wear during the day for comfort. 6)  Check labs today.  Will call you w/ results by Monday. 7)  Increase coumadin dose and recheck INR here on TUESDAY. 8)  Finish out Lovenox tomorrow.   Prescriptions: COUMADIN 1 MG TABS (WARFARIN SODIUM) Sunday - 7.5 mg, Monday - 8.5 mg, Tuesday - 7.5 mg, Wednesday - 7.5 mg, Thursday - 7.5 mg, Friday - 8.5 mg, Saturday - 7.5 mg  #30 x 0   Entered and Authorized by:      DO   Signed by:     DO on 09/20/2009   Method used:   Electronically to        [facility] [address] [facility] (retail)       [address]       [place], [place]  [address]       Ph: [phone] or [phone]       Fax: [phone]   RxID:   [id number] METFORMIN HCL 500 MG TABS (METFORMIN HCL) 1 tab by mouth bid  #60 x 3   Entered and Authorized by:     DO   Signed by:     DO on 09/20/2009   Method used:   Electronically to        [facility] [address] [facility] (retail)       [address] [place], [place]  [address]       Ph: [phone] or [phone]       Fax: [phone]   RxID:   [id number]   Laboratory Results   Urine Tests    Microalbumin (urine): 150 mg/L Creatinine: 200mg /dL  A:C Ratio 30-300  Blood Tests      INR: 1.9   (Normal Range: 0.88-1.12   Therap INR: 2.0-3.5)          Anticoagulation Management History:      The patient is on coumadin and comes in today for a routine follow up visit.  She is being anticoagulated because of the first episode of deep venous thrombosis and/or pulmonary embolism.  Anticipated length of treatment is 6 months.  Today's INR is 1.9.

## 2010-02-05 NOTE — Assessment & Plan Note (Signed)
Summary: ear pain/ PNA/ INR   Vital Signs:  Patient profile:   60 year old female Height:      62 inches Weight:      150 pounds BMI:     27.53 O2 Sat:      97 % on Room air Temp:     97.9 degrees F oral Pulse rate:   77 / minute BP sitting:   104 / 72  (left arm) Cuff size:   regular  Vitals Entered By: Sherlean Foot CMA (October 12, 2009 10:59 AM)  O2 Flow:  Room air CC: L ear ache x 1 day.   Primary Care Refugio Vandevoorde:  Loyal Gambler DO  CC:  L ear ache x 1 day.Marland Kitchen  History of Present Illness: 60 yo AAF presents for her coumadin check today but c/o L ear pain x 1 days.  She denies tinnitus, fullness or hearing loss.  Denies dizziness or fevers.  Denies any drainage.  Just finished 10 days of Zithromax for CAP which she was sent to the hospital for.  Last dose taken today.  Cough, energy level much improved.  Finished taking Mucinex 2 days ago.  Denies SOB.  Appetitie is coming back.  INR due today: 3.5.  Anticoagulation Management History:      The patient is on coumadin and comes in today for a routine follow up visit.  She is being anticoagulated because of the first episode of deep venous thrombosis and/or pulmonary embolism.  Anticipated length of treatment is 6 months.  Her last INR was 2.6 and today's INR is 3.5.    Current Medications (verified): 1)  Lisinopril-Hydrochlorothiazide 10-12.5 Mg Tabs (Lisinopril-Hydrochlorothiazide) .Marland Kitchen.. 1 Tab By Mouth Daily 2)  Metformin Hcl 500 Mg Tabs (Metformin Hcl) .Marland Kitchen.. 1 Tab By Mouth Bid 3)  One Touch Ultra 2 Test Strips .... Use 2 X A Day As Directed 4)  Coumadin 5 Mg Tabs (Warfarin Sodium) .Marland Kitchen.. 1 Tab Daily As Directed 5)  Coumadin 1 Mg Tabs (Warfarin Sodium) .... Sunday - 7.5 Mg, Monday - 5 Mg, Tuesday - 7.5 Mg, Wednesday - 7.5 Mg, Thursday - 7.5 Mg, Friday - 5 Mg, Saturday - 7.5 Mg 6)  Mucinex 600 Mg Xr12h-Tab (Guaifenesin) .... 2 Tabs By Mouth Q 12 Hrs For Cough  Allergies (verified): No Known Drug Allergies  Past History:  Past  Medical History: proteinuria (Dr Tressie Ellis). OAB -- off meds  SLE (Dr Barkley Boards)-- wants to change Raynauds HTN G3P3, post menopausal DVT --anti phospholipid antibody +  Social History: Reviewed history from 11/30/2007 and no changes required. 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 Lives alone.  Has 3 grown kids - 1 daughter and 2 sons, in [place], 2 grandkids. Occas ETOH Walks 1 hr everyday  Review of Systems      See HPI  Physical Exam  General:  alert, well-developed, well-nourished, and well-hydrated.   Head:  normocephalic and atraumatic.   Ears:  EACs patent; TMs translucent and gray with good cone of light and bony landmarks.; no external tenderness Mouth:  pharynx pink and moist.   Neck:  no masses.   Lungs:  Normal respiratory effort, chest expands symmetrically. Lungs are clear to auscultation, no crackles or wheezes. Heart:  Normal rate and regular rhythm. S1 and S2 normal without gallop, murmur, click, rub or other extra sounds. Extremities:  no LE edema Skin:  color normal.   Cervical Nodes:  No lymphadenopathy noted Psych:  good  eye contact, not anxious appearing, and not depressed appearing.     Impression & Recommendations:  Problem # 1:  EAR PAIN, LEFT (ICD-388.70) L ear pain with normal exam after 10+ days of antibiotics.  This is likely to be referred ear pain.   Can try Auralgan drops or Tylenol as needed for ear pain.  Her updated medication list for this problem includes:    Antipyrine-benzocaine 5.4-1.4 % Soln (Benzocaine-antipyrine) .Marland Kitchen... 2-4 drops in the left ear 4 x a day as needed for ear pain  Problem # 2:  PNEUMONIA, ORGANISM UNSPECIFIED (ICD-486) Improving!  Resp effort is much imporved, lung exam improved, pulse ox improved.  Energy level and appetite improving. Will repeat her CXR in 2 wks when she comes in for her INR.  Problem # 3:  DVT (ICD-453.40) INR 3.5 (2-3 is goal).  Will adjust her dose down and  recheck in 2 wks. Orders: Fingerstick [id number]) Protime INR (32440)  Problem # 4:  SLE (ICD-710.0) Pt wanted to be reffered to Dr Franki Monte after seeing Dr Barkley Boards for 2nd opinion. Orders: Rheumatology Referral (Rheumatology)  Complete Medication List: 1)  Lisinopril-hydrochlorothiazide 10-12.5 Mg Tabs (Lisinopril-hydrochlorothiazide) .Marland Kitchen.. 1 tab by mouth daily 2)  Metformin Hcl 500 Mg Tabs (Metformin hcl) .Marland Kitchen.. 1 tab by mouth bid 3)  One Touch Ultra 2 Test Strips  .... Use 2 x a day as directed 4)  Coumadin 5 Mg Tabs (Warfarin sodium) .Marland Kitchen.. 1 tab daily as directed 5)  Coumadin 1 Mg Tabs (Warfarin sodium) .... "Sunday - 7.5 mg, monday - 5 mg, tuesday - 7.5 mg, wednesday - 7.5 mg, thursday - 7.5 mg, friday - 5 mg, saturday - 7.5 mg 6)  Mucinex 600 Mg Xr12h-tab (Guaifenesin) .... 2 tabs by mouth q 12 hrs for cough 7)  Antipyrine-benzocaine 5.4-1.4 % Soln (Benzocaine-antipyrine) .... 2-4 drops in the left ear 4 x a day as needed for ear pain  Anticoagulation Management Assessment/Plan:      The target INR is 2.0-3.0.  She is to have a PT/INR in 2 weeks.  Anticoagulation instructions were given to patient.         Current Anticoagulation Instructions: The patient's dosage of coumadin will be decreased.  The new dosage includes: Coumadin 5 mg tabs: 1 tab daily as directed Coumadin 1 mg tabs:  Sunday - 7.5 mg, Monday - 5 mg, Tuesday - 7.5 mg, Wednesday - 7.5 mg, Thursday - 7.5 mg, Friday - 5 mg, Saturday - 7.5 mg.    Repeat PT/INR in 2 weeks.    Patient Instructions: 1)  Adjust coumadin dose down. 2)  Recheck in 2 wks and will repeat your CXR then. 3)  Pneumonia is much improving! 4)  Use Antipyrine drops  or Tylenol as needed for L ear pain. 5)  Next Diabetes visit due  the end of DEC. Prescriptions: ANTIPYRINE-BENZOCAINE 5.4-1.4 % SOLN (BENZOCAINE-ANTIPYRINE) 2-4 drops in the left ear 4 x a day as needed for ear pain  #1 bottle x 0   Entered and Authorized by:   Karen Bowen DO   Signed by:    Karen Bowen DO on 10/12/2009   Method used:   [facility] to        [facility]       [address]       [place], [place]  [address]       Ph: [phone] or [phone]       Fax: [phone]   RxID:  [id number]   Laboratory Results   Blood Tests      INR: 3.5   (Normal Range: 0.88-1.12   Therap INR: 2.0-3.5)

## 2010-02-05 NOTE — Letter (Signed)
Summary: [facility]   Imported By: Laural Benes 12/14/2009 10:18:26  _____________________________________________________________________  External Attachment:    Type:   Image     Comment:   External Document

## 2010-02-05 NOTE — Assessment & Plan Note (Signed)
Summary: KNEE PAIN//VGJ   Vital Signs:  Patient profile:   60 year old female Height:      62 inches Weight:      160 pounds Pulse rate:   88 / minute BP sitting:   123 / 78  (left arm) Cuff size:   regular  Vitals Entered By: Geanie Kenning (May 30, 2009 9:11 AM) CC: bilateral knee pain since Thursday last week. Aleve not helping   Primary Care Provider:  Loyal Gambler DO  CC:  bilateral knee pain since Thursday last week. Aleve not helping.  History of Present Illness: 60 yo AAF presents for achey knees x 6 days.  Denies any trauma.  She has SLE, sees Dr Barkley Boards.  Taking Aleve which helped over the weekend.  Her knees are both throbbing and aching.  Denies swelling, redness or heat.  Has pain with both walking and resting.  The pain is keeping her up at night.  She has never had pain in her knees like this before.    Denies any fevers.  Stopped RX prednisone on Sunday.    Current Medications (verified): 1)  Hydrochlorothiazide 25 Mg Tabs (Hydrochlorothiazide) .Marland Kitchen.. 1 Tab By Mouth Qam  Allergies (verified): No Known Drug Allergies  Comments:  Nurse/Medical Assistant: The patient's medications and allergies were reviewed with the patient and were updated in the Medication and Allergy Lists. Geanie Kenning (May 30, 2009 9:12 AM)  Past History:  Past Medical History: Reviewed history from 05/08/2009 and no changes required. proteinuria (Dr Tressie Ellis). OAB -- off meds  SLE (Dr Barkley Boards) HTN G3P3, post menopausal  Social History: Reviewed history from 11/30/2007 and no changes required. 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 Lives alone.  Has 3 grown kids - 1 daughter and 2 sons, in [place], 2 grandkids. Occas ETOH Walks 1 hr everyday  Review of Systems      See HPI  Physical Exam  General:  alert, well-developed, well-nourished, and well-hydrated.   Head:  normocephalic and atraumatic.   Neck:  no masses.   Lungs:  Normal respiratory  effort, chest expands symmetrically. Lungs are clear to auscultation, no crackles or wheezes. Heart:  Normal rate and regular rhythm. S1 and S2 normal without gallop, murmur, click, rub or other extra sounds. Msk:  no knee effusions, redness, crepitus or heat. full flexion/ extension and gait Extremities:  no LE edema Neurologic:  gait normal.   Skin:  color normal.   Psych:  good eye contact, not anxious appearing, and not depressed appearing.     Impression & Recommendations:  Problem # 1:  KNEE PAIN, BILATERAL (ICD-719.46) SLE vs OA pain with normal exam, no trauma.  Suspect flare of SLE given recent cessation of prednisone.  Will get an ESR on her today and send results to Dr Barkley Boards.  Place on RX Naproxen for inflammation and Hydrocodone for pain.  Call if not improved in 7 days.  She does not have an orthopedist.   Her updated medication list for this problem includes:    Naproxen 500 Mg Tabs (Naproxen) .Marland Kitchen... 1 tab by mouth two times a day with meals    Hydrocodone-acetaminophen 5-500 Mg Tabs (Hydrocodone-acetaminophen) .Marland Kitchen... 1 tab by mouth three times a day as needed severe knee pain, take with food  Complete Medication List: 1)  Hydrochlorothiazide 25 Mg Tabs (Hydrochlorothiazide) .Marland Kitchen.. 1 tab by mouth qam 2)  Naproxen 500 Mg Tabs (Naproxen) .Marland Kitchen.. 1 tab by mouth two times  a day with meals 3)  Hydrocodone-acetaminophen 5-500 Mg Tabs (Hydrocodone-acetaminophen) .Marland Kitchen.. 1 tab by mouth three times a day as needed severe knee pain, take with food  Other Orders: T-Sed Rate (Automated) [id number])  Patient Instructions: 1)  Check SED RATE today to see if this is flare of lupus. 2)  Will send results to Dr Barkley Boards. 3)  Start Naproxen RX with breakfast and dinner as your anti inflammatory. 4)  Use Hydrocodone for severe pain.  Take with food.  Caution about constipation and sedation. 5)  Call if not improved in 7 days. Prescriptions: HYDROCODONE-ACETAMINOPHEN 5-500 MG TABS  (HYDROCODONE-ACETAMINOPHEN) 1 tab by mouth three times a day as needed severe knee pain, take with food  #40 x 0   Entered and Authorized by:   Loyal Gambler DO   Signed by:   Loyal Gambler DO on 05/30/2009   Method used:   Printed then faxed to ...       [facility] [phone]* (retail)       [address] [place], [place]  [address]       Ph: [phone] or [phone]       Fax: [phone]   RxID:   [id number] NAPROXEN 500 MG TABS (NAPROXEN) 1 tab by mouth two times a day with meals  #60 x 0   Entered and Authorized by:   Loyal Gambler DO   Signed by:   Loyal Gambler DO on 05/30/2009   Method used:   Electronically to        [facility] [phone]* (retail)       [place], [place]  [address]       Ph: [phone] or [phone]       Fax: [phone]   RxID:   [id number]

## 2010-02-05 NOTE — Assessment & Plan Note (Signed)
Summary: INR /dt  Nurse Visit   Vital Signs:  Patient profile:   60 year old female Pulse rate:   84 / minute BP sitting:   113 / 74  (left arm) Cuff size:   regular  Vitals Entered By: Sherlean Foot CMA (November 27, 2009 10:55 AM)  Anticoagulation Management History:      The patient is on coumadin and comes in today for a routine follow up visit.  Coumadin therapy is being given due to the first episode of deep venous thrombosis and/or pulmonary embolism.  Anticipated length of treatment is 6 months.  Her last INR was 1.76 and today's INR is 2.0.     Allergies: No Known Drug Allergies Laboratory Results   Blood Tests      INR: 2.0   (Normal Range: 0.88-1.12   Therap INR: 2.0-3.5)    Orders Added: 1)  Fingerstick [[id number]] 2)  Protime INR [id number]   Anticoagulation Management Assessment/Plan:      The target INR is 2.0-3.0.  She is to have a PT/INR in 4 weeks.  Anticoagulation instructions were given to patient.         Current Anticoagulation Instructions: The patient is to continue with the same dose of coumadin.  This dosage includes:   Coumadin 5 mg tabs and Coumadin 1 mg tabs:  Sunday - 5 mg, Monday - 5 mg, Tuesday - 7.5 mg, Wednesday - 5 mg, Thursday - 5 mg, Friday - 7.5 mg, Saturday - 5 mg.    Repeat PT/INR in 4 weeks.

## 2010-02-05 NOTE — Letter (Signed)
Summary: [facility]   Imported By: Edmonia James 11/09/2009 10:57:43  _____________________________________________________________________  External Attachment:    Type:   Image     Comment:   External Document

## 2010-02-05 NOTE — Assessment & Plan Note (Signed)
Summary: HFU, PNA, again   Vital Signs:  Patient profile:   60 year old female Height:      62 inches Weight:      149 pounds BMI:     27.35 O2 Sat:      97 % on Room air Temp:     99.3 degrees F oral Pulse rate:   89 / minute BP sitting:   112 / 77  (left arm) Cuff size:   regular  Vitals Entered By: Sherlean Foot CMA (December 12, 2009 1:09 PM)  O2 Flow:  Room air CC: F/U pneumonia. INR   Primary Care Natilee Gauer:  Loyal Gambler DO  CC:  F/U pneumonia. INR.  History of Present Illness: 60 yo AAF presents for f/u visit.  She was re-admitted to [facility], discharged home after 4 days last Tuesday for pneumonia.  She went for a stress test yesterday that was normal.  She was treated with IV iron and sent home on oral iron.  She took 3 more days anbiotics.  She is feeling better now.  Her cough has much improved.  Energy level improving.  Appetite improving.  No CP or SOB.  No F/C.    She is set up to see Dr Franki Monte [place] for her SLE. This has been her 2nd bout with PNA in the past 2 mos.   She is due to recheck labs after having an elevated total protein at last lab draw.  She is due for a PNX AND FLU shot.  Doing well on Coumadin for DVT (diagnosed in Sept)/  She did test + antiphospholipid ab syndrome.   Anticoagulation Management History:      The patient is on coumadin and comes in today for a routine follow up visit.  Coumadin therapy is being given due to the first episode of deep venous thrombosis and/or pulmonary embolism.  Anticipated length of treatment is 6 months.  Her last INR was 2.0 and today's INR is 2.3.    Allergies: No Known Drug Allergies  Past History:  Past Medical History: Reviewed history from 11/20/2009 and no changes required. proteinuria (Dr Tressie Ellis). OAB -- off meds  SLE changing from [place] to [place] HTN G3P3, post menopausal DVT --anti phospholipid antibody + adrenal nodules on CT with pleural thickening 09-2009  Past Surgical  History: Reviewed history from 02/01/2008 and no changes required. TAH 2001 for fibroids with 1 oophorectomy  Social History: Reviewed history from 11/30/2007 and no changes required. 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 Lives alone.  Has 3 grown kids - 1 daughter and 2 sons, in [place], 2 grandkids. Occas ETOH Walks 1 hr everyday  Review of Systems      See HPI  Physical Exam  General:  alert, well-developed, well-nourished, and well-hydrated.   Head:  normocephalic and atraumatic.   Eyes:  conjunctiva clear, EOMI and PERRLA but she has a definite asymetry with proptosis of the L eye. Mouth:  good dentition and pharynx pink and moist.   Neck:  no masses.   Chest Wall:  no tenderness.   Lungs:  Normal respiratory effort, chest expands symmetrically. Lungs are clear to auscultation, no crackles or wheezes. Heart:  normal rate, regular rhythm, and no murmur.   Msk:  no joint effusions Pulses:  2+ radial pulses Extremities:  no UE or LE edema Neurologic:  gait normal.   Skin:  color normal.   Cervical Nodes:  No  lymphadenopathy noted Psych:  good eye contact, not anxious appearing, and not depressed appearing.     Impression & Recommendations:  Problem # 1:  PNEUMONIA, ORGANISM UNSPECIFIED (ICD-486) Recurrent pneumonia.   She appears to be clearing with normal lung exam and pulse ox today and improvment in cough and SOB. She was given PNX and Flu shots today. I do wonder why she is getting recurrent infections in the abscence of immunosuppressive drugs for her SLE. Will ask Dr Franki Monte to look for possible sarcoidosis given other other auto-immune diseases.  Problem # 2:  ANEMIA, IRON DEFICIENCY (ICD-280.9) She required IV iron in the hospital. Will check labs today and continue her on oral iron.  Pt pt's report, she has had a colonoscopy < 3 yrs ago but has a long hx of iron def anemia.    Her last Cr was 1.18. Orders: T-Hemoglobin  [id number])  Problem # 3:  PROPTOSIS (ICD-376.30) Assessment: New Will check TSH with labs and if this is normal, will procede with a CT of the L orbit to look for mass effect.  She is not having any HAs or visual compromise. Orders: T-TSH [id number])  Problem # 4:  DVT (ICD-453.40) Continue coumadin.  APA + --> may need long term treatment. Orders: Fingerstick [id number]) Protime INR (03474)  Problem # 5:  SLE (ICD-710.0) Has initial appt with Dr Franki Monte next month.  She is not on anything for SLE or Raynauds.  Problem # 6:  ESSENTIAL HYPERTENSION, BENIGN (ICD-401.1) BP good.  Continue current meds. Her updated medication list for this problem includes:    Hydrochlorothiazide 12.5 Mg Caps (Hydrochlorothiazide) .Marland Kitchen... 1 capsule by mouth daily  BP today: 112/77 Prior BP: 113/74 (11/27/2009)  Labs Reviewed: K+: 4.2 (11/16/2009) Creat: : 1.18 (11/16/2009)   Chol: 156 (05/10/2009)   HDL: 32 (05/10/2009)   LDL: 49 (05/10/2009)   TG: 377 (05/10/2009)  Problem # 7:  OTHER NONSPECIFIC FINDINGS EXAMINATION OF BLOOD (ICD-790.99) Due to repeat total protein with SPEP/ UPEP Orders: T-SPE w/reflex to IFE [id number]) T- * Misc. Laboratory test [phone])  Complete Medication List: 1)  Hydrochlorothiazide 12.5 Mg Caps (Hydrochlorothiazide) .Marland Kitchen.. 1 capsule by mouth daily 2)  One Touch Ultra 2 Test Strips  .... Use 2 x a day as directed 3)  Januvia 50 Mg Tabs (Sitagliptin phosphate) .Marland Kitchen.. 1 tab by mouth daily 4)  Coumadin 5 Mg Tabs (Warfarin sodium) .... Sunday - 5 mg, monday - 5 mg, tuesday - 7.5 mg, wednesday - 5 mg, thursday - 5 mg, friday - 7.5 mg, saturday - 5 mg 5)  Coumadin 7.5 Mg Tabs (Warfarin sodium) .... Sunday - 5 mg, monday - 5 mg, tuesday - 7.5 mg, wednesday - 5 mg, thursday - 5 mg, friday - 7.5 mg, saturday - 5 mg 6)  Fluconazole 150 Mg Tabs (Fluconazole) .Marland Kitchen.. 1 tab by mouth x 1  Other Orders: Admin 1st Vaccine [id number]) Flu Vaccine 27yrs + [id number]) Pneumococcal Vaccine  [id number]) Admin of Any Addtl Vaccine [id number])  Anticoagulation Management Assessment/Plan:      The target INR is 2.0-3.0.  She is to have a PT/INR in 3 weeks.  Anticoagulation instructions were given to patient.         Current Anticoagulation Instructions: The patient is to continue with the same dose of coumadin.  This dosage includes:   Coumadin 5 mg tabs and Coumadin 7.5 mg tabs:  Sunday - 5 mg, Monday - 5 mg, Tuesday - 7.5 mg, Wednesday - 5 mg, Thursday -  5 mg, Friday - 7.5 mg, Saturday - 5 mg.    Repeat PT/INR in 3 weeks.    Patient Instructions: 1)  Continue current meds. 2)  Update labs downstairs today. 3)  Will call you w/ results by end of the week. 4)  Stay on oral iron and keep appt with Dr Franki Monte. 5)  Plan to stay on Coumadin unitl March. 6)  If labs are normal, will set you up for CT head. 7)  Pneumovax and Flu shots today. 8)  REturn for follow up in 1 month. Prescriptions: FLUCONAZOLE 150 MG TABS (FLUCONAZOLE) 1 tab by mouth x 1  #1 tab x 0   Entered and Authorized by:   Loyal Gambler DO   Signed by:   Loyal Gambler DO on 12/12/2009   Method used:   Electronically to        [facility] [phone]* (retail)       [address]       [place], [place]  [address]       Ph: [phone] or [phone]       Fax: [phone]   RxID:   [id number]    Orders Added: 1)  Fingerstick [36416] 2)  Protime INR [85610] 3)  T-TSH [id number] 4)  T-SPE w/reflex to IFE [id number] 5)  T-Hemoglobin [id number] 6)  T- * Misc. Laboratory test [99999] 7)  Admin 1st Vaccine [90471] 8)  Flu Vaccine 77yrs + [id number] 9)  Pneumococcal Vaccine [90732] 10)  Admin of Any Addtl Vaccine [90472] 11)  Est. Patient Level V [id number]   Immunizations Administered:  Pneumonia Vaccine:    Vaccine Type: Pneumovax    Site: right deltoid    Dose: 0.5 ml    Route: IM    Given by: Sherlean Foot CMA    Exp. Date: 05/03/2011    Lot #: [id number]    VIS given: 12/11/08 version given  December 12, 2009.   Immunizations Administered:  Pneumonia Vaccine:    Vaccine Type: Pneumovax    Site: right deltoid    Dose: 0.5 ml    Route: IM    Given by: Sherlean Foot CMA    Exp. Date: 05/03/2011    Lot #: [id number]    VIS given: 12/11/08 version given December 12, 2009.  Laboratory Results   Blood Tests      INR: 2.3   (Normal Range: 0.88-1.12   Therap INR: 2.0-3.5)   Flu Vaccine Consent Questions     Do you have a history of severe allergic reactions to this vaccine? no    Any prior history of allergic reactions to egg and/or gelatin? no    Do you have a sensitivity to the preservative Thimersol? no    Do you have a past history of Guillan-Barre Syndrome? no    Do you currently have an acute febrile illness? no    Have you ever had a severe reaction to latex? no    Vaccine information given and explained to patient? yes    Are you currently pregnant? no    Lot Number:[id number]   Exp Date:07/06/2010   Site Given  Left Deltoid IM.3   (Normal Range: 0.88-1.12   Therap INR: 2.0-3.5)       .lbflu

## 2010-02-05 NOTE — Assessment & Plan Note (Signed)
Summary: ? DVT   Vital Signs:  Patient profile:   60 year old female Height:      62 inches Weight:      157 pounds BMI:     28.82 O2 Sat:      98 % on Room air Pulse rate:   84 / minute BP sitting:   122 / 74  (left arm) Cuff size:   regular  Vitals Entered By: Sherlean Foot CMA (September 13, 2009 11:00 AM)  O2 Flow:  Room air CC: L leg pain x 2 days.   Primary Care Provider:  Loyal Gambler DO  CC:  L leg pain x 2 days.Marland Kitchen  History of Present Illness: Joy Anderson is a 60 year-old female with a history of bilateral knee pain.  Today, reports a three day history of unilateral right calf pain.  She claims to have taken two aleeve once a day and took naproxen in the morning and in the evening with little relief. She also tried soaking it in epson salt and heating pads with has not been benefical.  She describes the pain as throbbing pain or soreness and ache "down to the bone" and grades the pain as a 5 out of 10.  She denies doing anything that caused direct injury to the area and walking on it makes it worse.  She claims that she is still active as she usually is and hasn't been sitting for prolonged periods of time or any recent long car trips or airline trips.  She denies any chest pain or palpitations, shortness of breath or hemoptysis.  She also denies any recent procedures or hospitalizations.  Current Medications (verified): 1)  Hydrochlorothiazide 25 Mg Tabs (Hydrochlorothiazide) .Marland Kitchen.. 1 Tab By Mouth Qam 2)  Naproxen 500 Mg Tabs (Naproxen) .Marland Kitchen.. 1 Tab By Mouth Two Times A Day With Meals 3)  Hydrocodone-Acetaminophen 5-500 Mg Tabs (Hydrocodone-Acetaminophen) .Marland Kitchen.. 1 Tab By Mouth Three Times A Day As Needed Severe Knee Pain, Take With Food  Allergies (verified): No Known Drug Allergies  Physical Exam  General:  alert, well-developed, and well-nourished.   Head:  normocephalic and atraumatic.   Nose:  no nasal discharge.   Mouth:  pharynx pink and moist.   Neck:  supple and  no cervical lymphadenopathy.   Lungs:  Clear to auscultation bilaterally with no rales, rhonchi, wheezes or crackles Heart:  normal rate, regular rhythm, no murmur, no gallop, and no rub.   Msk:  antalgic gait with   palpable cord in the L calf with tenderness.  no skin/ color changes. + L Homan Sign.  notable 'hardness' but no increase in calf circ on the L. Extremities:  no pedal edema; 2+ pedal pulses Neurologic:  DTRs symmetrical and normal.  Strength is 5/5 in all modalities tested.   Skin:  color normal.   Psych:  good eye contact, not anxious appearing, and not depressed appearing.     Impression & Recommendations:  Problem # 1:  CALF PAIN, LEFT (ICD-729.5) 5 days of non traumatic L calf pain, swelling and palpable cord with no notable risk factors for DVT. High index of suspicion for DVT.  Will get a LE venous ultrasound at [facility] today and f/u results this afternoon.  If + for DVT, will need to start Lovenox injections today + coumadin either thru [facility] or thru the hospital.   Orders: T-Venous thrombosis imaging unilateral [id number])  Complete Medication List: 1)  Hydrochlorothiazide 25 Mg Tabs (Hydrochlorothiazide) .Marland Kitchen.. 1 tab by  mouth qam 2)  Naproxen 500 Mg Tabs (Naproxen) .Marland Kitchen.. 1 tab by mouth two times a day with meals 3)  Hydrocodone-acetaminophen 5-500 Mg Tabs (Hydrocodone-acetaminophen) .Marland Kitchen.. 1 tab by mouth three times a day as needed severe knee pain, take with food  Patient Instructions: 1)  Will get your ultrasound today. 2)  I will call you with results today. 3)  If + for DVT, will need to start Lovenox either thru home healthcare or thru the hospital.

## 2010-02-05 NOTE — Assessment & Plan Note (Signed)
Summary: PNA?-- sent to ED   Vital Signs:  Patient profile:   60 year old female Height:      62 inches Weight:      152 pounds Temp:     101.7 degrees F oral BP sitting:   110 / 78  (left arm) Cuff size:   regular  Vitals Entered By: Sherlean Foot CMA (October 02, 2009 11:53 AM)   Primary Care Arissa Fagin:  Loyal Gambler DO   History of Present Illness: 60 yo AAF presents for dyspnea that started yesterday morning.  She recently was put on coumadin for a DVT which she has been taking.  Last INR was 3.1 on the 20th.  She denies cough but has diffuse chest tightness and trouble getting air in.  Has no appetite.  Has a fever.  No N/V/ rash.  Taking all of her meds as directed.      Current Medications (verified): 1)  Hydrochlorothiazide 25 Mg Tabs (Hydrochlorothiazide) .Marland Kitchen.. 1 Tab By Mouth Qam 2)  Hydrocodone-Acetaminophen 5-500 Mg Tabs (Hydrocodone-Acetaminophen) .Marland Kitchen.. 1 Tab By Mouth Three Times A Day As Needed Severe Knee Pain, Take With Food 3)  Coumadin 5 Mg Tabs (Warfarin Sodium) .... Take 1 1/2 Tabs By Mouth Once Daily 4)  Metformin Hcl 500 Mg Tabs (Metformin Hcl) .Marland Kitchen.. 1 Tab By Mouth Bid 5)  One Touch Ultra 2 Test Strips .... Use 2 X A Day As Directed 6)  Coumadin 7.5 Mg Tabs (Warfarin Sodium) .... Sunday - 7.5 Mg, Monday - 8.5 Mg, Tuesday - 7.5 Mg, Wednesday - 7.5 Mg, Thursday - 7.5 Mg, Friday - 7.5 Mg, Saturday - 7.5 Mg 7)  Coumadin 1 Mg Tabs (Warfarin Sodium) .... Sunday - 7.5 Mg, Monday - 8.5 Mg, Tuesday - 7.5 Mg, Wednesday - 7.5 Mg, Thursday - 7.5 Mg, Friday - 7.5 Mg, Saturday - 7.5 Mg  Allergies (verified): No Known Drug Allergies  Past History:  Past Medical History: proteinuria (Dr Tressie Ellis). OAB -- off meds  SLE (Dr Barkley Boards) HTN G3P3, post menopausal DVT   Past Surgical History: Reviewed history from 02/01/2008 and no changes required. TAH 2001 for fibroids with 1 oophorectomy  Family History: Reviewed history from 11/30/2007 and no changes required. mother  died at 21 from hemorrhage during an abortion. father died at 43, ETOH brother (older) died of colon cancer at 70 brother (younger) died of ALS at 52 2 sisters healthy 2 brothers alive - prostate cancer, cerebral palsy Gm - diabetes  Social History: Reviewed history from 11/30/2007 and no changes required. 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 Lives alone.  Has 3 grown kids - 1 daughter and 2 sons, in [place], 2 grandkids. Occas ETOH Walks 1 hr everyday  Review of Systems       The patient complains of anorexia, fever, chest pain, dyspnea on exertion, and peripheral edema.  The patient denies prolonged cough and headaches.    Physical Exam  General:  alert and well-developed.  appears in resp distress Head:  normocephalic and atraumatic.   Eyes:  pupils equal, pupils round, and pupils reactive to light.   Ears:  no external deformities.   Mouth:  pharynx pink and moist.  hallotosis Neck:  no masses.   Chest Wall:  diffuse anterior chest wall TTP Lungs:  poor air movement at the bases with no audible wheezes, rales or rhonchi, splinting and tachypneic Heart:  distant HS; Reg rhythm, rate 110 Abdomen:  soft.  Pulses:  2+ radial pulses, no cyanosis Extremities:  no LE edema Skin:  color normal.   Cervical Nodes:  No lymphadenopathy noted Psych:  slightly anxious.     Impression & Recommendations:  Problem # 1:  PNEUMONIA, ORGANISM UNSPECIFIED (ICD-486) Likely pneumonia given  1 day of anterior chest tightness, dyspnea, and fever. Cannot rule out other causes such at PE given her recent DVT (though was therapeutic last wk). Due to her dyspnea with resp distress, called EMS to transport pt immediately to [facility] now.  She was placed on 2 L portable Oxygen in the office.  Unable to get a pulse ox on her-- it would not read.  She will need a CXR/ labs.  Complete Medication List: 1)  Hydrochlorothiazide 25 Mg Tabs  (Hydrochlorothiazide) .Marland Kitchen.. 1 tab by mouth qam 2)  Hydrocodone-acetaminophen 5-500 Mg Tabs (Hydrocodone-acetaminophen) .Marland Kitchen.. 1 tab by mouth three times a day as needed severe knee pain, take with food 3)  Coumadin 5 Mg Tabs (Warfarin sodium) .... Take 1 1/2 tabs by mouth once daily 4)  Metformin Hcl 500 Mg Tabs (Metformin hcl) .Marland Kitchen.. 1 tab by mouth bid 5)  One Touch Ultra 2 Test Strips  .... Use 2 x a day as directed 6)  Coumadin 7.5 Mg Tabs (Warfarin sodium) .... Sunday - 7.5 mg, monday - 8.5 mg, tuesday - 7.5 mg, wednesday - 7.5 mg, thursday - 7.5 mg, friday - 7.5 mg, saturday - 7.5 mg 7)  Coumadin 1 Mg Tabs (Warfarin sodium) .... Sunday - 7.5 mg, monday - 8.5 mg, tuesday - 7.5 mg, wednesday - 7.5 mg, thursday - 7.5 mg, friday - 7.5 mg, saturday - 7.5 mg

## 2010-02-05 NOTE — Letter (Signed)
Summary: Primary Care Consult Scheduled Letter  [facility]  [address] [place]   [place], [place] [address]   Phone: [phone]  Fax: [phone]      09/24/2009 MRN: [id number]  Joy Anderson [id number] [place],   [address]    Dear [name],     We have scheduled an appointment for you.  At the recommendation of Dr. Valetta Close, we have scheduled you with [facility] on 10/02/09 Tuesday at 1:00.  Their address is [address] [place] [address] ( Admissions Dept.). The office phone number is [phone].  If this appointment day and time is not convenient for you, please feel free to call the office of the doctor you are being referred to at the number listed above and reschedule the appointment.     It is important for you to keep your scheduled appointments. We are here to make sure you are given good patient care.     Thank you, Otho Ket [id number] Patient Care Coordinator [place]

## 2010-02-05 NOTE — Consult Note (Signed)
Summary: [facility]   Imported By: Edmonia James 11/01/2009 08:24:32  _____________________________________________________________________  External Attachment:    Type:   Image     Comment:   External Document

## 2010-02-05 NOTE — Letter (Signed)
Summary: Primary Care Consult Scheduled Letter  [facility]  [address] [place]   [place], [place] [address]   Phone: [phone]  Fax: [phone]      09/24/2009 MRN: [id number]  DESIRAI BEBOUT [id number] [place], [place]  [address]    Dear [name],    We have scheduled an appointment for you.  At the recommendation of Dr.Bowen, we have scheduled you a consult with ____________________ on __________________ at ______.  Their address is_________________________. The office phone number is ____________.  If this appointment day and time is not convenient for you, please feel free to call the office of the doctor you are being referred to at the number listed above and reschedule the appointment.     It is important for you to keep your scheduled appointments. We are here to make sure you are given good patient care. If you have questions or you have made changes to your appointment, please notify us at  [phone]         , ask for              .    Thank you,  Patient Care Coordinator [place]

## 2010-02-07 ENCOUNTER — Ambulatory Visit (INDEPENDENT_AMBULATORY_CARE_PROVIDER_SITE_OTHER): Payer: BC Managed Care – PPO

## 2010-02-07 ENCOUNTER — Encounter: Payer: Self-pay | Admitting: Family Medicine

## 2010-02-07 ENCOUNTER — Ambulatory Visit: Admit: 2010-02-07 | Payer: Self-pay | Admitting: Family Medicine

## 2010-02-07 DIAGNOSIS — I82409 Acute embolism and thrombosis of unspecified deep veins of unspecified lower extremity: Secondary | ICD-10-CM

## 2010-02-07 DIAGNOSIS — Z7901 Long term (current) use of anticoagulants: Secondary | ICD-10-CM

## 2010-02-07 NOTE — Assessment & Plan Note (Signed)
Summary: INR  Nurse Visit   Vitals Entered By: Sherlean Foot CMA (January 01, 2010 11:14 AM)  Anticoagulation Management History:      Coumadin therapy is being given due to the first episode of deep venous thrombosis and/or pulmonary embolism.  Anticipated length of treatment is 6 months.  Her last INR was 2.3 and today's INR is 3.6.     Allergies: No Known Drug Allergies Laboratory Results   Blood Tests      INR: 3.6   (Normal Range: 0.88-1.12   Therap INR: 2.0-3.5)    Orders Added: 1)  Fingerstick [[id number]] 2)  Protime INR [id number]   Anticoagulation Management Assessment/Plan:      The target INR is 2.0-3.0.  She is to have a PT/INR in 1 week.  Anticoagulation instructions were given to patient.         Current Anticoagulation Instructions: The patient is to hold (do not take) the Tuesday dose of coumadin.  The dosage to be resumed includes: Coumadin 5 mg tabs and Coumadin 7.5 mg tabs:  Sunday - 5 mg, Monday - 5 mg, Tuesday - 7.5 mg, Wednesday - 5 mg, Thursday - 5 mg, Friday - 5 mg, Saturday - 5 mg.  Repeat PT/INR in 1 week.

## 2010-02-07 NOTE — Assessment & Plan Note (Signed)
Summary: INR  Nurse Visit   Vitals Entered By: Sherlean Foot CMA (January 10, 2010 10:44 AM)  Allergies: No Known Drug Allergies Laboratory Results   Blood Tests      INR: 3.0   (Normal Range: 0.88-1.12   Therap INR: 2.0-3.5)    Orders Added: 1)  Protime INR [[id number]] 2)  Fingerstick [id number]   Anticoagulation Management History:      The patient is on coumadin and comes in today for a routine follow up visit.  Coumadin therapy is being given due to the first episode of deep venous thrombosis and/or pulmonary embolism.  Anticipated length of treatment is 6 months.  Her last INR was 3.6 and today's INR is 3.0.    Anticoagulation Management Assessment/Plan:      The target INR is 2.0-3.0.  She is to have a PT/INR in 4 weeks.  Anticoagulation instructions were given to patient.         Current Anticoagulation Instructions: The patient's dosage of coumadin will be decreased.  The new dosage includes: .    Repeat PT/INR in 4 weeks.

## 2010-02-07 NOTE — Assessment & Plan Note (Signed)
Summary: f/u visit   Vital Signs:  Patient profile:   60 year old female Height:      62 inches Weight:      147 pounds BMI:     26.98 Pulse rate:   78 / minute BP sitting:   123 / 77  (left arm) Cuff size:   regular  Vitals Entered By: Sherlean Foot CMA (January 16, 2010 10:23 AM) CC: 1 mo f/u   Primary Care Provider:  Loyal Gambler DO  CC:  1 mo f/u.  History of Present Illness: 60 yo AAF presents for f/u.  She has SLE, antiphospholipid ab syndrome and is on coumadin for a DVT-- diagnosed 09-20-2009.  She has seen Dr Su Hoff (hematology) and has a f/u next wk with Dr Franki Monte.    She has recovered from 2 recent bouts of pneumonia and is feeling much better.  Denies chest pain, SOB or fatigue today.  Her last Hgb was only 9.5.  She did get her pneumovax at last visit.   Anticoagulation Management History:      Anticoagulation is being administered due to the first episode of deep venous thrombosis and/or pulmonary embolism.  Anticipated length of treatment is 6 months.  Her last INR was 3.0.    Current Medications (verified): 1)  Hydrochlorothiazide 12.5 Mg Caps (Hydrochlorothiazide) .Marland Kitchen.. 1 Capsule By Mouth Daily 2)  One Touch Ultra 2 Test Strips .... Use 2 X A Day As Directed 3)  Januvia 50 Mg Tabs (Sitagliptin Phosphate) .Marland Kitchen.. 1 Tab By Mouth Daily 4)  Fluconazole 150 Mg Tabs (Fluconazole) .Marland Kitchen.. 1 Tab By Mouth X 1 5)  Coumadin 5 Mg Tabs (Warfarin Sodium) .... Sunday - 5 Mg, Monday - 5 Mg, Tuesday - 7.5 Mg, Wednesday - 5 Mg, Thursday - 5 Mg, Friday - 5 Mg, Saturday - 5 Mg 6)  Coumadin 7.5 Mg Tabs (Warfarin Sodium) .... Sunday - 5 Mg, Monday - 5 Mg, Tuesday - 7.5 Mg, Wednesday - 5 Mg, Thursday - 5 Mg, Friday - 5 Mg, Saturday - 5 Mg  Allergies (verified): No Known Drug Allergies  Past History:  Past Medical History: proteinuria (Dr Tressie Ellis). OAB -- off meds  SLE changing from [place] to [place] HTN G3P3, post menopausal DVT --anti phospholipid antibody +-- Dr  Lewanda Rife adrenal nodules on CT with pleural thickening 09-2009  Past Surgical History: Reviewed history from 02/01/2008 and no changes required. TAH 2001 for fibroids with 1 oophorectomy  Social History: Reviewed history from 11/30/2007 and no changes required. 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 Lives alone.  Has 3 grown kids - 1 daughter and 2 sons, in [place], 2 grandkids. Occas ETOH Walks 1 hr everyday  Review of Systems      See HPI  Physical Exam  General:  alert, well-developed, well-nourished, and well-hydrated.   Head:  normocephalic and atraumatic.   Eyes:  conjunctiva clear Mouth:  pharynx pink and moist.   Neck:  no masses.   Lungs:  Normal respiratory effort, chest expands symmetrically. Lungs are clear to auscultation, no crackles or wheezes. Heart:  Normal rate and regular rhythm. S1 and S2 normal without gallop, murmur, click, rub or other extra sounds. Extremities:  no LE edema or calf TTP Skin:  color normal.   Cervical Nodes:  No lymphadenopathy noted Psych:  good eye contact, not anxious appearing, and not depressed appearing.     Impression & Recommendations:  Problem # 1:  DVT (ICD-453.40)  INR UTD, on treatment since Sept.  Plan to treat for 6 mos total. Concern for high risk of clots given APA syndrome but this was her first blood clot and she has no prior hx of miscarriages. She has seen Dr Lewanda Rife so I will talk to him before she completes her tratment in March.  Problem # 2:  ANEMIA, IRON DEFICIENCY (ICD-280.9) Last iron in Nov was low normal.  Had a normal colonoscopy 04-2008.  Asymptomatic.  This could be secondary to SLE but I also think she has some renal impairement with her SLE given her continued proteinuria that I am going to have Dr Franki Monte address.  Problem # 3:  PNEUMONIA, ORGANISM UNSPECIFIED (ICD-486) Assessment: Improved REsolved.  Problem # 4:  SLE (ICD-710.0) Has f/u with Dr Franki Monte soon. Not on  any meds.  Complete Medication List: 1)  Hydrochlorothiazide 12.5 Mg Caps (Hydrochlorothiazide) .Marland Kitchen.. 1 capsule by mouth daily 2)  One Touch Ultra 2 Test Strips  .... Use 2 x a day as directed 3)  Januvia 50 Mg Tabs (Sitagliptin phosphate) .Marland Kitchen.. 1 tab by mouth daily 4)  Coumadin 5 Mg Tabs (Warfarin sodium) .... Sunday - 5 mg, monday - 5 mg, tuesday - 7.5 mg, wednesday - 5 mg, thursday - 5 mg, friday - 5 mg, saturday - 5 mg 5)  Coumadin 7.5 Mg Tabs (Warfarin sodium) .... Sunday - 5 mg, monday - 5 mg, tuesday - 7.5 mg, wednesday - 5 mg, thursday - 5 mg, friday - 5 mg, saturday - 5 mg  Patient Instructions: 1)  BP looks great.   2)  F/U with Dr Franki Monte next wk. 3)  I will talk to Dr Lewanda Rife about continuing your coumadin after DVT treatment is complete (03-21-2010). 4)  Return for follow up in 2 mos.   Orders Added: 1)  Est. Patient Level III [id number]

## 2010-02-07 NOTE — Consult Note (Signed)
Summary: [facility]   Imported By: Edmonia James 01/25/2010 10:24:26  _____________________________________________________________________  External Attachment:    Type:   Image     Comment:   External Document

## 2010-02-07 NOTE — Progress Notes (Signed)
Summary: lifelong coumadin  Phone Note Outgoing Call   Summary of Call: Pls let pt know that I reviewed the recent OV notes from Dr Lewanda Rife and he made the recommendation for her to [facility] on coumadin indefinitely for antiphospholipid antibody syndrome which puts her at high risk for recurrent blood clots.   Initial call taken by: Loyal Gambler DO,  January 18, 2010 8:39 AM  Follow-up for Phone Call        Pt aware of the above Follow-up by: Sherlean Foot CMA,  January 18, 2010 1:31 PM

## 2010-02-11 ENCOUNTER — Ambulatory Visit (INDEPENDENT_AMBULATORY_CARE_PROVIDER_SITE_OTHER): Payer: BC Managed Care – PPO | Admitting: Family Medicine

## 2010-02-11 ENCOUNTER — Encounter: Payer: Self-pay | Admitting: Family Medicine

## 2010-02-11 DIAGNOSIS — M109 Gout, unspecified: Secondary | ICD-10-CM | POA: Insufficient documentation

## 2010-02-11 LAB — CONVERTED CEMR LAB
Basophils Relative: 1 % (ref 0–1)
Eosinophils Absolute: 0 10*3/uL (ref 0.0–0.7)
MCHC: 33 g/dL (ref 30.0–36.0)
MCV: 81 fL (ref 78.0–100.0)
Monocytes Relative: 4 % (ref 3–12)
Neutrophils Relative %: 41 % — ABNORMAL LOW (ref 43–77)
Platelets: 310 10*3/uL (ref 150–400)
RBC: 3.91 M/uL (ref 3.87–5.11)

## 2010-02-12 ENCOUNTER — Encounter: Payer: Self-pay | Admitting: Family Medicine

## 2010-02-12 LAB — CONVERTED CEMR LAB: Uric Acid, Serum: 8.7 mg/dL — ABNORMAL HIGH (ref 2.4–7.0)

## 2010-02-13 NOTE — Letter (Signed)
Summary: [facility]   Imported By: Edmonia James 02/08/2010 08:09:41  _____________________________________________________________________  External Attachment:    Type:   Image     Comment:   External Document

## 2010-02-13 NOTE — Assessment & Plan Note (Signed)
Summary: INR   Vitals Entered By: Sherlean Foot CMA (February 07, 2010 9:57 AM)  Anticoagulation Management History:      The patient is on coumadin and comes in today for a routine follow up visit.  Anticoagulation is being administered due to the first episode of deep venous thrombosis and/or pulmonary embolism.  Anticipated length of treatment is 6 months.  Her last INR was 3.0 and today's INR is 2.1.    Allergies: No Known Drug Allergies   Complete Medication List: 1)  Hydrochlorothiazide 12.5 Mg Caps (Hydrochlorothiazide) .Marland Kitchen.. 1 capsule by mouth daily 2)  One Touch Ultra 2 Test Strips  .... Use 2 x a day as directed 3)  Januvia 50 Mg Tabs (Sitagliptin phosphate) .Marland Kitchen.. 1 tab by mouth daily 4)  Coumadin 5 Mg Tabs (Warfarin sodium) .... Sunday - 5 mg, monday - 5 mg, tuesday - 5 mg, wednesday - 5 mg, thursday - 5 mg, friday - 5 mg, saturday - 5 mg  Other Orders: Fingerstick [id number]) Protime INR (09811)  Anticoagulation Management Assessment/Plan:      The target INR is 2.0-3.0.  She is to have a PT/INR in 4 weeks.  Anticoagulation instructions were given to patient.         Current Anticoagulation Instructions: The patient is to continue with the same dose of coumadin.  This dosage includes:   Coumadin 5 mg tabs:  Sunday - 5 mg, Monday - 5 mg, Tuesday - 5 mg, Wednesday - 5 mg, Thursday - 5 mg, Friday - 5 mg, Saturday - 5 mg.    Repeat PT/INR in 4 weeks.     Orders Added: 1)  Fingerstick [[id number]] 2)  Protime INR [85610]    Laboratory Results   Blood Tests      INR: 2.1   (Normal Range: 0.88-1.12   Therap INR: 2.0-3.5)       Appended Document: INR Pt aware of the above

## 2010-02-21 ENCOUNTER — Ambulatory Visit: Payer: Self-pay | Admitting: Family Medicine

## 2010-02-21 NOTE — Assessment & Plan Note (Signed)
Summary: podagra   Vital Signs:  Patient profile:   60 year old female Height:      62 inches Weight:      147 pounds BMI:     26.98 O2 Sat:      97 % on Room air Temp:     98.2 degrees F oral Pulse rate:   77 / minute BP sitting:   124 / 80  (left arm) Cuff size:   regular  Vitals Entered By: Sherlean Foot CMA (February 11, 2010 10:29 AM)  O2 Flow:  Room air CC: R big toe pain and swelling   Primary Care Provider:  Loyal Gambler DO  CC:  R big toe pain and swelling.  History of Present Illness: 60 yo AAF presents for a sore R big toe x 5 days.  Denies any trauma.  She has SLE and Raynauds but has not complained of cold, numb fingers and toes.  She has had pain and throbbing in her R MTP joint.  Hurt to touch.  Denies any fever.  Has pain with bearing weight and walking.  She is taking Tylenol which helped the throbbing.  Denies any nighttime pain.  Allergies: No Known Drug Allergies  Past History:  Past Medical History: Reviewed history from 01/16/2010 and no changes required. proteinuria (Dr Tressie Ellis). OAB -- off meds  SLE changing from [place] to [place] HTN G3P3, post menopausal DVT --anti phospholipid antibody +-- Dr Lewanda Rife adrenal nodules on CT with pleural thickening 09-2009  Past Surgical History: Reviewed history from 02/01/2008 and no changes required. TAH 2001 for fibroids with 1 oophorectomy  Social History: Reviewed history from 11/30/2007 and no changes required. 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 Lives alone.  Has 3 grown kids - 1 daughter and 2 sons, in [place], 2 grandkids. Occas ETOH Walks 1 hr everyday  Physical Exam  General:  alert, well-developed, well-nourished, and well-hydrated.   Lungs:  Normal respiratory effort, chest expands symmetrically. Lungs are clear to auscultation, no crackles or wheezes. Heart:  Normal rate and regular rhythm. S1 and S2 normal without gallop, murmur, click, rub or  other extra sounds. Msk:  bunion R foot, nontender hallux rigidus R great toe with painful dorsi and plantar flexion Pulses:  2+ pedal pulses Extremities:  no pedal or ankle edema Neurologic:  antalgic gait to the L Skin:  hyperpigmentation over the R MTP joint with increased warmth   Impression & Recommendations:  Problem # 1:  PODAGRA (ICD-274.01) Exam c/w podagra, likely new dx of gout.  Without other joint aches, unlikely to be from her SLE and does not have typical Raynauds appearance or symptoms.  Will treat with Prednisone 40 mg/ day x 7 days along with Tylenol as needed pain.  Will stop HCTZ and replace with Lisinopril since HCTZ can precipitate gout.  Check uric acid, CBC and ESR today. Orders: T-Uric Acid (Blood) [id number]) T-CBC w/Diff [id number]) T-Sed Rate (Automated) [id number])  Complete Medication List: 1)  Lisinopril 10 Mg Tabs (Lisinopril) .Marland Kitchen.. 1 tab by mouth daily 2)  One Touch Ultra 2 Test Strips  .... Use 2 x a day as directed 3)  Januvia 50 Mg Tabs (Sitagliptin phosphate) .Marland Kitchen.. 1 tab by mouth daily 4)  Coumadin 5 Mg Tabs (Warfarin sodium) .... Sunday - 5 mg, monday - 5 mg, tuesday - 5 mg, wednesday - 5 mg, thursday - 5 mg, friday - 5 mg, saturday - 5 mg 5)  Prednisone 20 Mg Tabs (Prednisone) .... 2 tabs by mouth once daily x 7 days 6)  Hydroxychloroquine Sulfate 200 Mg Tabs (Hydroxychloroquine sulfate) .... Take 1 tab by mouth two times a day  Patient Instructions: 1)  Take Prednisone - 40 mg once a day x 5 days, then 20 mg once daily until RX runs out. 2)  OK to take Tylenol Extra Strength for additional pain. 3)  Labs today to confirm gout diagnosis. 4)  Change HCTZ to Lisinopril once daily. 5)  Return for f/u ? gout/ BP in 3 wks. Prescriptions: PREDNISONE 20 MG TABS (PREDNISONE) 2 tabs by mouth once daily x 7 days  #14 x 0   Entered and Authorized by:   Loyal Gambler DO   Signed by:   Loyal Gambler DO on 02/11/2010   Method used:   Electronically to         [facility] [phone]* (retail)       [place], [place]  [address]       Ph: [phone] or [phone]       Fax: [phone]   RxID:   [id number] LISINOPRIL 10 MG TABS (LISINOPRIL) 1 tab by mouth daily  #30 x 3   Entered and Authorized by:   Loyal Gambler DO   Signed by:   Loyal Gambler DO on 02/11/2010   Method used:   Electronically to        [facility] [phone]* (retail)       [address]       [place], [place]  [address]       Ph: [phone] or [phone]       Fax: [phone]   RxID:   [id number]    Orders Added: 1)  T-Uric Acid (Blood) [[id number]] 2)  T-CBC w/Diff [id number] 3)  T-Sed Rate (Automated) [id number] 4)  Est. Patient Level III [id number]

## 2010-03-08 ENCOUNTER — Encounter: Payer: Self-pay | Admitting: Family Medicine

## 2010-03-08 ENCOUNTER — Ambulatory Visit (INDEPENDENT_AMBULATORY_CARE_PROVIDER_SITE_OTHER): Payer: BC Managed Care – PPO

## 2010-03-08 DIAGNOSIS — I82409 Acute embolism and thrombosis of unspecified deep veins of unspecified lower extremity: Secondary | ICD-10-CM

## 2010-03-08 DIAGNOSIS — Z7901 Long term (current) use of anticoagulants: Secondary | ICD-10-CM

## 2010-03-12 ENCOUNTER — Telehealth (INDEPENDENT_AMBULATORY_CARE_PROVIDER_SITE_OTHER): Payer: Self-pay | Admitting: *Deleted

## 2010-03-14 NOTE — Medication Information (Signed)
Summary: INR   PCP: Loyal Gambler DO           Allergies: No Known Drug Allergies  Anticoagulation Management History:      The patient is on coumadin and comes in today for a routine follow up visit.  Anticoagulation is being administered due to the first episode of deep venous thrombosis and/or pulmonary embolism.  Anticipated length of treatment is 6 months.  Her last INR was 2.1 and today's INR is 3.1.    Anticoagulation Management Assessment/Plan:      The target INR is 2.0-3.0.  She is to have a PT/INR in 3 weeks.  Anticoagulation instructions were given to patient.         Current Anticoagulation Instructions: The patient's dosage of coumadin will be decreased.  The new dosage includes:      Coumadin 5 mg tabs:  Sunday - 5 mg, Monday - 5 mg, Tuesday - 5 mg, Wednesday - 2.5 mg, Thursday - 5 mg, Friday - 5 mg, Saturday    - 5 mg.   Repeat PT/INR in 3 weeks.    Laboratory Results   Blood Tests      INR: 3.1   (Normal Range: 0.88-1.12   Therap INR: 2.0-3.5)   Pt c/o that BP med is causing cough.   Anticoagulation Management History:      The patient is on coumadin and comes in today for a routine follow up visit.  Anticoagulation is being administered due to the first episode of deep venous thrombosis and/or pulmonary embolism.  Anticipated length of treatment is 6 months.  Her last INR was 2.1 and today's INR is 3.1.    Anticoagulation Management Assessment/Plan:      The target INR is 2.0-3.0.  She is to have a PT/INR in 3 weeks.  Anticoagulation instructions were given to patient.         Current Anticoagulation Instructions: The patient's dosage of coumadin will be decreased.  The new dosage includes:      Coumadin 5 mg tabs:  Sunday - 5 mg, Monday - 5 mg, Tuesday - 5 mg, Wednesday - 2.5 mg, Thursday - 5 mg, Friday - 5 mg, Saturday    - 5 mg.   Repeat PT/INR in 3 weeks.    Appended Document: INR changed due to ACEi cough.  Loyal Gambler, D.O.

## 2010-03-18 ENCOUNTER — Other Ambulatory Visit: Payer: Self-pay | Admitting: Family Medicine

## 2010-03-18 ENCOUNTER — Ambulatory Visit
Admission: RE | Admit: 2010-03-18 | Discharge: 2010-03-18 | Disposition: A | Discharge status: Home / Self Care | Payer: BC Managed Care – PPO | Source: Ambulatory Visit | Attending: Family Medicine | Admitting: Family Medicine

## 2010-03-18 ENCOUNTER — Ambulatory Visit (INDEPENDENT_AMBULATORY_CARE_PROVIDER_SITE_OTHER): Payer: BC Managed Care – PPO | Admitting: Family Medicine

## 2010-03-18 ENCOUNTER — Encounter: Payer: Self-pay | Admitting: Family Medicine

## 2010-03-18 DIAGNOSIS — D509 Iron deficiency anemia, unspecified: Secondary | ICD-10-CM

## 2010-03-18 DIAGNOSIS — M109 Gout, unspecified: Secondary | ICD-10-CM

## 2010-03-18 DIAGNOSIS — E119 Type 2 diabetes mellitus without complications: Secondary | ICD-10-CM

## 2010-03-18 DIAGNOSIS — M329 Systemic lupus erythematosus, unspecified: Secondary | ICD-10-CM

## 2010-03-19 NOTE — Progress Notes (Signed)
Summary: [facility]-Gout flare up  Phone Note Call from Patient Call back at Home Phone [phone]   Caller: Patient Call For: Loyal Gambler DO Reason for Call: Acute Illness Summary of Call: Pt was diagnosed with Gout in 02/2010.  Pt completed med regimen given at that time.  Pt calls today with complaint of swelling and pain in right great toe.  Pt states prednisone helped with inflammation,but did not help pain.  Pt states she can not bend the joint today.  Unable to put on shoe.  Pt has follow up for this on 03/18/10. [facility] Initial call taken by: Abelino Derrick CMA Deborra Medina),  March 12, 2010 10:33 AM  Follow-up for Phone Call        Sent in second round of steroids. Make sure to keep f/u appt on 12th.  Follow-up by: [name] MD,  March 12, 2010 10:57 AM    Prescriptions: PREDNISONE 20 MG TABS (PREDNISONE) 2 tabs by mouth once daily x 7 days  #14 x 0   Entered and Authorized by:   [name] MD   Signed by:   [name] MD on 03/12/2010   Method used:   Electronically to        [facility] [phone]* (retail)       [address]       [place], [place]  [address]       Ph: [phone] or [phone]       Fax: [phone]   RxID:   [id number]    [place] Pt of the above.Jerelene Redden CMA, [place] March 12, 2010 11:05 AM

## 2010-03-26 NOTE — Assessment & Plan Note (Signed)
Summary: gout   Vital Signs:  Patient profile:   60 year old female Height:      62 inches Weight:      149 pounds BMI:     27.35 O2 Sat:      98 % on Room air Pulse rate:   72 / minute BP sitting:   133 / 85  (left arm) Cuff size:   regular  Vitals Entered By: Sherlean Foot CMA (March 18, 2010 10:32 AM)  O2 Flow:  Room air CC: F/U   Primary Care Provider:  Loyal Gambler DO  CC:  F/U.  History of Present Illness: 60 yo AAF presents for f/u visit.  Gout - flaring up even with Prednisone - sees Bravo in May.  Unable to take NSAIDs with use of coumadin for antiphospholipid ab syndrome.   AM fastings in the 90s.  avoiding sugar and low carb. R  big toe pain limits activity. no chest pain or DOE.  no other joint pains.    Allergies: 1)  ! Ace Inhibitors  Past History:  Past Medical History: Reviewed history from 01/16/2010 and no changes required. proteinuria (Dr Tressie Ellis). OAB -- off meds  SLE changing from [place] to [place] HTN G3P3, post menopausal DVT --anti phospholipid antibody +-- Dr Lewanda Rife adrenal nodules on CT with pleural thickening 09-2009  Social History: Reviewed history from 11/30/2007 and no changes required. 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 Lives alone.  Has 3 grown kids - 1 daughter and 2 sons, in [place], 2 grandkids. Occas ETOH Walks 1 hr everyday  Review of Systems General:  Denies fatigue and fever. CV:  Denies chest pain or discomfort, shortness of breath with exertion, and swelling of feet. Resp:  Denies chest pain with inspiration, cough, and shortness of breath.  Physical Exam  General:  alert, well-developed, well-nourished, and well-hydrated.   Neck:  no masses.   Lungs:  Normal respiratory effort, chest expands symmetrically. Lungs are clear to auscultation, no crackles or wheezes. Heart:  Normal rate and regular rhythm. S1 and S2 normal without gallop, murmur, click, rub or other  extra sounds. Msk:  R 1st MTP effusion with hallux rigidus Pulses:  2+ pedal pulses Extremities:  no UE or LE edema Neurologic:  antalgic gait to the L Skin:  color normal and no rashes.     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  Will xray to look for erosion of the joint.  The only medicine that she can take w/o risk is Uloric.  I will start this today but did not add NSAIDs or colchicine for prevention due to coumadin/ plaquenil use/ anemia.    May need joint arthrocentesis and injection for dx/ pain relief.  Will treat acute pain with Vicodin. Her updated medication list for this problem includes:    Uloric 40 Mg Tabs (Febuxostat) .Marland Kitchen... 1 tab by mouth daily  Orders: T-DG Toe Great*R* [phone])  Problem # 2:  SLE (ICD-710.0) Managed by Dr Franki Monte. The following medications were removed from the medication list:    Prednisone 20 Mg Tabs (Prednisone) .Marland Kitchen... 2 tabs by mouth once daily x 7 days Her updated medication list for this problem includes:    Hydroxychloroquine Sulfate 200 Mg Tabs (Hydroxychloroquine sulfate) .Marland Kitchen... Take 1 tab by mouth two times a day  Problem # 3:  ESSENTIAL HYPERTENSION, BENIGN (ICD-401.1) BP  stable.  She apparently has not been taking ATenolol so this was removed from her list. The following medications were removed from the medication list:    Atenolol 25 Mg Tabs (Atenolol) Her updated medication list for this problem includes:    Amlodipine Besylate 5 Mg Tabs (Amlodipine besylate) .Marland Kitchen... 1 tab by mouth daily  BP today: 133/85 Prior BP: 124/80 (02/11/2010)  Labs Reviewed: K+: 4.2 (11/16/2009) Creat: : 1.18 (11/16/2009)   Chol: 156 (05/10/2009)   HDL: 32 (05/10/2009)   LDL: 49 (05/10/2009)   TG: 377 (05/10/2009)  Problem # 4:  UNSPECIFIED ANEMIA (ICD-285.9) Stable.  Taking OTC iron daily. I will try to get her in with Dr Tressie Ellis for f/u mild CKD with anemia.      Problem # 5:  DIABETES MELLITUS, TYPE II (ICD-250.00)  She stopped Januvia on her own b/c she was not seeing improvment in sugar readings.  A1C stable at 6.4.  Will continue diabetic diet and monitor in 3-4 mos. The following medications were removed from the medication list:    Januvia 50 Mg Tabs (Sitagliptin phosphate) .Marland Kitchen... 1 tab by mouth daily  Orders: Fingerstick ([id number]) Hemoglobin A1C (83036)  Labs Reviewed: Creat: 1.18 (11/16/2009)   Microalbumin: 150 (09/20/2009) Reviewed HgBA1c results: 6.4 (03/18/2010)  6.8 (11/16/2009)  Complete Medication List: 1)  One Touch Ultra 2 Test Strips  .... Use 2 x a day as directed 2)  Hydroxychloroquine Sulfate 200 Mg Tabs (Hydroxychloroquine sulfate) .... Take 1 tab by mouth two times a day 3)  Coumadin 5 Mg Tabs (Warfarin sodium) .... Sunday - 5 mg, monday - 5 mg, tuesday - 5 mg, wednesday - 2.5 mg, thursday - 5 mg, friday - 5 mg, saturday - 5 mg 4)  Amlodipine Besylate 5 Mg Tabs (Amlodipine besylate) .... 1 tab by mouth daily 5)  Uloric 40 Mg Tabs (Febuxostat) .... 1 tab by mouth daily 6)  Hydrocodone-acetaminophen 5-325 Mg Tabs (Hydrocodone-acetaminophen) .... 1-2 tabs by mouth three times a day as needed severe pain; take with food  Patient Instructions: 1)  Start Uloric 40 mg/ day for gout prevention. 2)  Xray big toe today.  Will call you w/ results. 3)  I will send notes to dr Bravo re: gout treatment options. 4)  Use Vicodin for severe pain as needed. 5)  Return for f/u labs/ gout in 4 wks. Prescriptions: HYDROCODONE-ACETAMINOPHEN 5-325 MG TABS (HYDROCODONE-ACETAMINOPHEN) 1-2 tabs by mouth three times a day as needed severe pain; take with food  #40 x 0   Entered and Authorized by:     DO   Signed by:     DO on 03/18/2010   Method used:   Printed then faxed to ...       [id number]       [address]       [place], [place]  [address]       Ph: [phone] or [phone]       Fax:  [phone]   RxID:   [id number] ULORIC 40 MG TABS (FEBUXOSTAT) 1 tab by mouth daily  #30 x 1   Entered and Authorized by:     DO   Signed by:     DO on 03/18/2010   Method used:   Electronically to        [id number]       [address]       [place], [place]  [address]  Ph: [phone] or [phone]       Fax: [phone]   RxID:   [id number]    Orders Added: 1)  Fingerstick [36416] 2)  Hemoglobin A1C [83036] 3)  T-DG Toe Great*R* [id number] 4)  Est. Patient Level IV [id number]    Laboratory Results   Blood Tests     HGBA1C: 6.4%   (Normal Range: Non-Diabetic - 3-6%   Control Diabetic - 6-8%)

## 2010-04-08 ENCOUNTER — Encounter: Payer: Self-pay | Admitting: Family Medicine

## 2010-04-15 ENCOUNTER — Ambulatory Visit: Payer: BC Managed Care – PPO

## 2010-04-15 DIAGNOSIS — Z01419 Encounter for gynecological examination (general) (routine) without abnormal findings: Secondary | ICD-10-CM

## 2010-04-16 NOTE — Assessment & Plan Note (Signed)
NAME:  Joy Anderson, Joy Anderson NO.:  [id number]  MEDICAL RECORD NO.:  [id number]           PATIENT TYPE:  LOCATION:  [place] [facility] [place]:  PHYSICIAN:  Malcolm Metro, CNM         DATE OF BIRTH:  DATE OF SERVICE:  04/15/2010                                 CLINIC NOTE  The patient is being seen at the [facility], [place].  REASON FOR TODAY'S VISIT:  Annual exam.  HISTORY OF PRESENT ILLNESS:  The patient presents for annual exam.  She is status post hysterectomy x6 years.  She does have some complaints of pain with intercourse and some pink discharge afterwards.  Upon further exploration of this complaint, the patient states that she is having increasing vaginal dryness and she feels friction and rubbing during intercourse and discomfort at the introitus and the labia.  After intercourse, she has noticed some pink spotting on tissue but no bleeding.  She denies any unusual discharge or itching or irritation at this time.  Last intercourse was 1 month ago and she has had no spotting or discharge since.  PAST MEDICAL HISTORY:  The patient is currently being treated for lupus by Dr. Franki Monte and hypertension and Raynaud syndrome by her primary care doctor, Dr. Valetta Close.  Last year, the patient suffered DVT and was hospitalized for DVT in her left lower extremity and pneumonia.  She is currently on Coumadin and being followed by her primary care providers.  PAST SURGICAL HISTORY:  Tubal ligation and hysterectomy.  All other history is unchanged.  MEDICATIONS:  Now include Aleve, Plaquenil, Coumadin, iron, vitamin C, vitamin D, and amlodipine.  ALLERGIES:  She has no known drug allergies.  REVIEW OF SYSTEM:  Negative except for what has already been described in HPI.  PHYSICAL EXAMINATION:  VITAL SIGNS:  Pulse is 88, blood pressure is 133/64, her weight is 150, and her height is 62 inches. GENERAL:  She is a well-nourished  [place] American female who is well developed in no apparent distress. HEENT:  Grossly normal, however, she has poor dentition. NECK:  Thyroid, no masses. LUNGS:  Clear to auscultation bilaterally A and P. HEART:  Regular rate and rhythm without murmurs or bruits. BREASTS:  No skin changes.  No masses.  No dimpling of the skin or discharge from the nipples.  They are erect. ABDOMEN:  Soft and obese without herniation.  There is noted well-healed incision at the line of her umbilicus of her tubal ligation that is nontender to palpation. GENITALIA:  She is a Tanner 5.  Vagina is atrophic.  Vaginal cuff is intact.  There is moderate amount of creamy white discharge that is not odorous.  Bimanual exam reveals an absent uterus.  Adnexa are nonpalpable.  No cystocele or rectocele are appreciated during the exam and no external hemorrhoids are noted. EXTREMITIES:  Nontender without edema.  ASSESSMENT AND PLAN:  The patient is a 60 year old African American female with well-woman exam. 1. Pap smears are unnecessary as her pathology of the uterus look     normal and her last Pap smear was normal as well.  The patient is     agreeable  to continue this plan of care.  She should return in 1     year for GYN physical. 2. Annual mammograms.  Her mammogram in December was normal.  It is BI-     RADS 1.  She should repeat in December. 3. A scant amount of hyphae noted on wet prep and reviewed in the     office.  We will treat for yeast with Diflucan 150 mg x1. 4. The patient is encouraged to use lubrication on a regular basis     with intimacy and encourage to use Replens over-the-counter vaginal     supplement as directed every 2-3 days.  She is in agreement with     this plan.  She should return in 1 year or p.r.n. problems.          ______________________________ Malcolm Metro, CNM    SS/MEDQ  D:  04/15/2010  T:  04/16/2010  Job:  [id number]

## 2010-04-17 ENCOUNTER — Encounter: Payer: Self-pay | Admitting: Family Medicine

## 2010-04-17 ENCOUNTER — Ambulatory Visit (INDEPENDENT_AMBULATORY_CARE_PROVIDER_SITE_OTHER): Payer: BC Managed Care – PPO | Admitting: Family Medicine

## 2010-04-17 VITALS — BP 118/80 | HR 82 | Ht 62.0 in | Wt 149.0 lb

## 2010-04-17 DIAGNOSIS — I82409 Acute embolism and thrombosis of unspecified deep veins of unspecified lower extremity: Secondary | ICD-10-CM

## 2010-04-17 DIAGNOSIS — M109 Gout, unspecified: Secondary | ICD-10-CM | POA: Insufficient documentation

## 2010-04-17 MED ORDER — WARFARIN SODIUM 5 MG PO TABS: ORAL_TABLET | ORAL | Status: DC

## 2010-04-17 NOTE — Progress Notes (Signed)
  Subjective:    Patient ID: Joy Anderson, female    DOB: 14-May-1950, 60 y.o.   MRN: [id number]  HPI 60 yo AAF presents for f/u visit.  She was started on Uloric a month ago for gout.  Her xray was + for degeneration a the MTP joint and her uric acid was high at 8.7 in Feb.  She has f/u appt with Dr Franki Monte in 3 wks.  She will be due to have her ESR and uric acid level rechecked.  I chose to not put her on Allopurinol due to mild CKD, anemia and she is not on NSAIDs due to coumadin use.  She is having to pay a lot for uloric and worries about having to stay on it, though her pain and swelling are starting to improve.  She has vicodin to use for severe pain.  She is now  Able to wear a shoe and walk better.    BP 118/80  Pulse 82  Ht 5\' 2"  (1.575 m)  Wt 149 lb (67.586 kg)  BMI 27.25 kg/m2  SpO2 99%  LMP 03/18/1999   Review of Systems  Constitutional: Negative for fever and fatigue.  Musculoskeletal: Positive for joint swelling, arthralgias and gait problem.  Skin: Negative for color change.       Objective:   Physical Exam  Constitutional: She appears well-developed and well-nourished. No distress.  Neck: Neck supple.  Cardiovascular: Normal rate, regular rhythm and normal heart sounds.   Pulmonary/Chest: Effort normal and breath sounds normal.  Musculoskeletal: She exhibits tenderness. She exhibits no edema.       Podagra R MTP with hallux rigidus, less swollen and less tender  Skin: Skin is warm and dry.          Assessment & Plan:

## 2010-04-17 NOTE — Patient Instructions (Signed)
Continue coumadin at 5 mg everyday except for 1/2 tab on Wed.  Stay on Uloric and f/u with Dr Franki Monte in May.  REturn for f/u visit in 4 mos.

## 2010-04-17 NOTE — Assessment & Plan Note (Signed)
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 Some improvement on Uloric 40 mg/ day.  She has f/u with Dr Franki Monte in 3 wks.  I will ask him if she should stay on Uloric.  Unable to take NSAIDs to help with prevention with new start uloric due to use of coumadin.  Cost is an issue.  Savings card given today.  Her uric acid is due to be rechecked next month.

## 2010-05-04 ENCOUNTER — Other Ambulatory Visit: Payer: Self-pay | Admitting: Family Medicine

## 2010-05-15 ENCOUNTER — Ambulatory Visit (INDEPENDENT_AMBULATORY_CARE_PROVIDER_SITE_OTHER): Payer: BC Managed Care – PPO | Admitting: Family Medicine

## 2010-05-15 DIAGNOSIS — I82409 Acute embolism and thrombosis of unspecified deep veins of unspecified lower extremity: Secondary | ICD-10-CM

## 2010-05-21 NOTE — Assessment & Plan Note (Signed)
NAME:  Joy Anderson, Joy Anderson NO.:  [id number]   MEDICAL RECORD NO.:  [id number]          PATIENT TYPE:  POB   LOCATION:  [place] [facility] [place]:  [place]:  Emily Filbert, MD        DATE OF BIRTH:  16-Nov-1950   DATE OF SERVICE:                                  CLINIC NOTE   [name] is a 60 year old lady who saw Dr. Gala Romney on November 29, 2008, for her annual exam.  At that time, she was complaining of early  satiety and decrease in appetite and yet weight gain.  She says that she  had a colonoscopy for the this issue, and it was within normal limits.  She is concerned about ovarian cancer since the maternal aunt had  ovarian cancer.  Dr. Gala Romney did an ultrasound which was entirely  normal.  Both ovaries appeared normal, and there was no fluid  identified.  Please note, that she had a vaginal hysterectomy for  fibroids.  Pathology shows that her cervix was normal and I have told  her she needs no further Pap smear testing, but she does need an annual  exam with a speculum exam.      Emily Filbert, MD     MCD/MEDQ  D:  12/20/2008  T:  12/21/2008  Job:  [id number]

## 2010-05-21 NOTE — Assessment & Plan Note (Signed)
NAME:  Joy, Anderson NO.:  [id number]   MEDICAL RECORD NO.:  [id number]          PATIENT TYPE:  POB   LOCATION:  [place] [facility] [place]:  Joy Sacramento, MD      DATE OF BIRTH:  [birth year]   DATE OF SERVICE:  11/29/2008                                  CLINIC NOTE   The patient is a 60 year old G3, P3 female, who is menopausal.  The  patient had a hysterectomy in 2001 for fibroids.  The patient is a new  patient to our practice.  The last Pap smear was 4 years ago.  Last  mammogram was approximately 4 years ago as well.  The patient had a  vaginal hysterectomy for fibroids.  We will try to get the records to  see if she had a normal cervix.  If she did have a normal cervix, we can  most likely discontinue Pap smears.  The patient did have abnormal Pap  smear approximately 17 years ago.  Biopsies were done, but she denies  any LEEP or cryo.  The patient is sexually active in a monogamous  relationship and denies any sexual problems.  The patient does complain  of bloating and early satiety.  She has decreased appetite in general.  She has had a colonoscopy this year which was within normal limits.  Her  general practitioner do not know if she is having problems with her  appetite and change in her bowel habits.  I suggest that she make an  appointment with her primary care physician to address these things.  She does have ovarian cancer in an aunt.  I will do a transvaginal  ultrasound to evaluate her ovaries just to rule this out.   PAST MEDICAL HISTORY:  Recently diagnosed with lupus.  She has problems  with arthritis in her upper extremities and some with her kidneys as  well.  The patient also has hypertension.   PAST SURGICAL HISTORY:  Tubal ligation and hysterectomy.   GYN HISTORY:  Three vaginal deliveries, abnormal Pap smear as described  above, fibroid tumors necessitating a hysterectomy.  No history of  ovarian  cyst.   FAMILY HISTORY:  Brother died of colon cancer at age 74.  Ovarian cancer  in an aunt.   MEDICATIONS:  Azor, hydrochlorothiazide, and Aleve.   ALLERGIES:  None.   REVIEW OF SYSTEMS:  Negative except for the bloating and change in bowel  habits and change in appetite.   PHYSICAL EXAMINATION:  VITAL SIGNS:  Pulse 89, blood pressure 108/68,  weight 159, and height 62 inches.  GENERAL:  Well nourished, well developed, no apparent stress.  DENTITION:  The patient has missing teeth and is going to the dentist to  get dentures.  THYROID:  No masses.  LUNGS:  Clear to auscultation bilaterally.  HEART:  Regular rate and rhythm.  BREASTS:  No skin changes.  No lymphadenopathy.  No masses.  No nipple  discharge.  ABDOMEN:  Soft, obese.  No organomegaly.  No hernia.  Well-healed  incision at the umbilicus from bilateral tubal ligation.  GENITALIA:  Tanner V.  Vagina, atrophic, cuff intact.  Bimanual, no masses.  Uterus,  surgically absent.  Adnexa, no ovaries felt, nontender.  The patient's  body habitus precludes feeling of the ovaries.  Rectovaginal, no masses.  No hemorrhoids.  No cystocele.  No rectocele.  EXTREMITIES:  Nontender.   ASSESSMENT AND PLAN:  A 60 year old female with well-woman exam.  1. Pap smear today.  We will get pathology of the uterus to see if the      patient had a normal cervix.  If she did have a normal cervix, we      will discontinue Pap smears.  The patient told this and is      agreeable.  2. Mammogram ordered.  3. Due to bloating and change in appetite, we will order a      transvaginal ultrasound to look at the ovaries and look for      ascites.  If this is normal, the patient is to go over these      changes with the primary care [place] Every.  4. Return to clinic in 3 weeks to review results.           ______________________________  Joy Sacramento, MD     KL/MEDQ  D:  11/29/2008  T:  11/30/2008  Job:  [id number]

## 2010-06-03 ENCOUNTER — Other Ambulatory Visit: Payer: Self-pay | Admitting: Family Medicine

## 2010-06-05 ENCOUNTER — Ambulatory Visit: Payer: Self-pay | Admitting: Family Medicine

## 2010-06-05 ENCOUNTER — Ambulatory Visit (INDEPENDENT_AMBULATORY_CARE_PROVIDER_SITE_OTHER): Payer: BC Managed Care – PPO | Admitting: Family Medicine

## 2010-06-05 DIAGNOSIS — I82409 Acute embolism and thrombosis of unspecified deep veins of unspecified lower extremity: Secondary | ICD-10-CM

## 2010-06-05 LAB — POCT INR: INR: 1

## 2010-06-05 NOTE — Progress Notes (Signed)
  Subjective:    Patient ID: Joy Anderson, female    DOB: 09-12-1950, 60 y.o.   MRN: [id number]  HPI    Review of Systems     Objective:   Physical Exam        Assessment & Plan:

## 2010-06-05 NOTE — Patient Instructions (Signed)
Pt is OFF coumadin in preparation for a kidney biopsy with Dr Tressie Ellis tomorrow.  She has been OFF for 5 days and is not on bridge therapy with heparin or lovenox.  Will fax her INR to him for review.  Plan to recheck her in 2 wks.

## 2010-06-26 ENCOUNTER — Encounter: Payer: Self-pay | Admitting: Family Medicine

## 2010-06-26 ENCOUNTER — Ambulatory Visit: Payer: Self-pay | Admitting: Family Medicine

## 2010-06-26 ENCOUNTER — Ambulatory Visit (INDEPENDENT_AMBULATORY_CARE_PROVIDER_SITE_OTHER): Payer: BC Managed Care – PPO | Admitting: Family Medicine

## 2010-06-26 VITALS — BP 178/103 | HR 88 | Ht 62.0 in | Wt 157.0 lb

## 2010-06-26 DIAGNOSIS — I10 Essential (primary) hypertension: Secondary | ICD-10-CM

## 2010-06-26 DIAGNOSIS — M1990 Unspecified osteoarthritis, unspecified site: Secondary | ICD-10-CM

## 2010-06-26 DIAGNOSIS — M129 Arthropathy, unspecified: Secondary | ICD-10-CM

## 2010-06-26 DIAGNOSIS — I82409 Acute embolism and thrombosis of unspecified deep veins of unspecified lower extremity: Secondary | ICD-10-CM

## 2010-06-26 LAB — POCT INR: INR: 1.6

## 2010-06-26 MED ORDER — FUROSEMIDE 20 MG PO TABS: ORAL_TABLET | ORAL | Status: DC

## 2010-06-26 NOTE — Progress Notes (Signed)
  Subjective:    Patient ID: Joy Anderson, female    DOB: 08-Jan-1950, 60 y.o.   MRN: [id number]  HPI  60 yo AAF presents to have her INR checked.  She has had L ankle swelling for the past 5 days w/o trauma.  Denies redness, heat or bruising.  Has not done anything for treatment.  She denies R foot swelling.  Denies pain with standing or walking.  She did just start on prednisone for minimal change dz per Dr Tressie Ellis.  She has joint pain from her lupus in other joints.  BP 178/103  Pulse 88  Ht 5\' 2"  (1.575 m)  Wt 157 lb (71.215 kg)  BMI 28.72 kg/m2  SpO2 99%  LMP 03/18/1999   Review of Systems  Constitutional: Negative for fever and chills.  Respiratory: Negative for shortness of breath.   Cardiovascular: Negative for chest pain.  Gastrointestinal: Negative for abdominal pain.  Genitourinary: Negative for hematuria.  Musculoskeletal: Positive for joint swelling and arthralgias.  Skin: Negative for color change and rash.  Hematological: Does not bruise/bleed easily.  Psychiatric/Behavioral: Negative for dysphoric mood.       Objective:   Physical Exam  Constitutional: She appears well-developed and well-nourished.  Neck: Neck supple.  Cardiovascular: Normal rate, regular rhythm and normal heart sounds.   Pulmonary/Chest: Effort normal and breath sounds normal.  Musculoskeletal:       Mild L tibiotalar edema w/o pain.  Neg anterior drawer of the L ankle.  Neg talar tilt, 2+ pedal pulses with intact sensation. No color changes or heat.  Skin: Skin is warm and dry.  Psychiatric: She has a normal mood and affect.          Assessment & Plan:  1.  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  Elevate L ankle and use ice.  She has no pain, redness or heat to suggest other diagnosis.  Call if any changes.  2.  HTN-- very high today.  Adding Lasix 20 mg/ day for swelling and BP.  REcheck BP in 2  wks.  3. Coumadin check - adjusted dose up today.  Recheck 7-6.

## 2010-06-26 NOTE — Patient Instructions (Signed)
Add Lasix 20 mg once daily for swelling/ elevated BP.  Recheck BP when you come back to have INR rechecked.  Call me if any problems.

## 2010-07-12 ENCOUNTER — Encounter: Payer: Self-pay | Admitting: Family Medicine

## 2010-07-12 ENCOUNTER — Ambulatory Visit (INDEPENDENT_AMBULATORY_CARE_PROVIDER_SITE_OTHER): Payer: BC Managed Care – PPO | Admitting: Family Medicine

## 2010-07-12 VITALS — BP 165/104 | HR 81

## 2010-07-12 DIAGNOSIS — I82409 Acute embolism and thrombosis of unspecified deep veins of unspecified lower extremity: Secondary | ICD-10-CM

## 2010-07-12 NOTE — Progress Notes (Signed)
  Subjective:    Patient ID: Joy Anderson, female    DOB: 07-17-1950, 60 y.o.   MRN: [id number]  HPI  Pt denies chest pain, SOB, dizziness, or heart palpitations.  Taking meds as directed w/o problems.  Denies medication side effects.  5 min spent with pt.     Review of Systems     Objective:   Physical Exam        Assessment & Plan:

## 2010-07-15 ENCOUNTER — Encounter: Payer: Self-pay | Admitting: Family Medicine

## 2010-07-15 DIAGNOSIS — N040 Nephrotic syndrome with minor glomerular abnormality: Secondary | ICD-10-CM | POA: Insufficient documentation

## 2010-07-16 ENCOUNTER — Other Ambulatory Visit: Payer: Self-pay | Admitting: Family Medicine

## 2010-08-08 DIAGNOSIS — E559 Vitamin D deficiency, unspecified: Secondary | ICD-10-CM | POA: Insufficient documentation

## 2010-08-08 DIAGNOSIS — R760 Raised antibody titer: Secondary | ICD-10-CM | POA: Insufficient documentation

## 2010-08-11 ENCOUNTER — Other Ambulatory Visit: Payer: Self-pay | Admitting: Family Medicine

## 2010-08-27 ENCOUNTER — Ambulatory Visit: Payer: Self-pay | Admitting: Family Medicine

## 2010-08-27 ENCOUNTER — Encounter: Payer: Self-pay | Admitting: Family Medicine

## 2010-08-27 ENCOUNTER — Ambulatory Visit (INDEPENDENT_AMBULATORY_CARE_PROVIDER_SITE_OTHER): Payer: BC Managed Care – PPO | Admitting: Family Medicine

## 2010-08-27 DIAGNOSIS — D6859 Other primary thrombophilia: Secondary | ICD-10-CM

## 2010-08-27 DIAGNOSIS — D6861 Antiphospholipid syndrome: Secondary | ICD-10-CM

## 2010-08-27 DIAGNOSIS — N040 Nephrotic syndrome with minor glomerular abnormality: Secondary | ICD-10-CM

## 2010-08-27 DIAGNOSIS — I10 Essential (primary) hypertension: Secondary | ICD-10-CM

## 2010-08-27 DIAGNOSIS — E119 Type 2 diabetes mellitus without complications: Secondary | ICD-10-CM

## 2010-08-27 DIAGNOSIS — Z86718 Personal history of other venous thrombosis and embolism: Secondary | ICD-10-CM

## 2010-08-27 DIAGNOSIS — M329 Systemic lupus erythematosus, unspecified: Secondary | ICD-10-CM

## 2010-08-27 HISTORY — DX: Antiphospholipid syndrome: D68.61

## 2010-08-27 LAB — POCT GLYCOSYLATED HEMOGLOBIN (HGB A1C): Hemoglobin A1C: 6.8

## 2010-08-27 MED ORDER — ATENOLOL 50 MG PO TABS: 50.0000 mg | ORAL_TABLET | Freq: Every day | ORAL | Status: DC

## 2010-08-27 NOTE — Assessment & Plan Note (Signed)
She is having a hard time tolerating the prednisone.  Not sure how long she will need to stay on this but she appears to have a lot of swelling today which is iehter from the nephrotic syndrome or the prednisone.

## 2010-08-27 NOTE — Patient Instructions (Signed)
Add Atenolol 50 mg once daily for high BP ( you were just as high last visit in June when you did not have this swelling).  A1C is 6.8 = OK.  Return for next PT/ INR in 3-4 wks.  F/u with Dr Tressie Ellis for nephrotic syndrome.    Return for f/u visit in 3  mos.

## 2010-08-27 NOTE — Progress Notes (Signed)
  Subjective:    Patient ID: Joy Anderson, female    DOB: [birth year], 60 y.o.   MRN: [id number]  HPI 60 yo AAF presents for f/u diet controlled T2DM.  Her A1C is 6.8 today.  She goes back to see Dr Tressie Ellis in Sept for Nephrotic syndrome, still on Prednisone daily.  She sees Dr Franki Monte for her SLE, on Plaquenil and recently saw her opthamologist.  She is on coumadin for life after having DVTs with underlying antiphospholipid antibody syndrome.  She is not feeling well due to the prednisone and had a bout of gastroenteritis this month with has resolved.    She is feeling tired, irritable and puffy from her steroids.  Her AM fasting sugars remain in the 90s .  She is sticking to a low carb/ low sugar diet.  BP 171/104  Pulse 90  Ht 5\' 3"  (1.6 m)  Wt 152 lb (68.947 kg)  BMI 26.93 kg/m2  SpO2 97%  LMP 03/18/1999   Review of Systems  Constitutional: Positive for fatigue.  Eyes: Positive for visual disturbance.  Respiratory: Negative for shortness of breath.   Cardiovascular: Positive for leg swelling. Negative for chest pain and palpitations.  Gastrointestinal: Negative for nausea and diarrhea.  Musculoskeletal: Positive for arthralgias. Negative for gait problem.  Skin: Negative for rash.  Psychiatric/Behavioral: The patient is nervous/anxious.        Objective:   Physical Exam  Constitutional: She appears well-developed and well-nourished. No distress.       Moon facies  HENT:  Mouth/Throat: Oropharynx is clear and moist.  Eyes: Conjunctivae are normal. No scleral icterus.  Cardiovascular: Normal rate, regular rhythm and normal heart sounds.   Pulmonary/Chest: Effort normal and breath sounds normal. No respiratory distress. She has no wheezes.  Musculoskeletal: She exhibits edema (2+ pitting pedal and LE edema bilat).  Lymphadenopathy:    She has no cervical adenopathy.  Skin: Skin is warm and dry. No rash noted.  Psychiatric: She has a normal mood and affect.            Assessment & Plan:

## 2010-08-27 NOTE — Assessment & Plan Note (Signed)
BP high even on Lasix 40 mg/day and was this high w/o the edema last visit.  Will add Atenolol once daily.  May need to change if her Raynaud's symptoms worsen.

## 2010-08-27 NOTE — Assessment & Plan Note (Signed)
Diet controlled.  A1C OK at 6.7 today.  AM fastings at goal even on prednisone.  Continue to follow and watch diet closely.

## 2010-08-27 NOTE — Assessment & Plan Note (Signed)
On lifelong coumadin.  INR 2.9 today.  Kept dose the same.  Repeat in 3-4 wks.

## 2010-09-23 ENCOUNTER — Ambulatory Visit: Payer: Self-pay | Admitting: Family Medicine

## 2010-09-23 ENCOUNTER — Ambulatory Visit (INDEPENDENT_AMBULATORY_CARE_PROVIDER_SITE_OTHER): Payer: BC Managed Care – PPO | Admitting: Family Medicine

## 2010-09-23 VITALS — BP 185/111 | HR 88

## 2010-09-23 DIAGNOSIS — I82409 Acute embolism and thrombosis of unspecified deep veins of unspecified lower extremity: Secondary | ICD-10-CM

## 2010-09-23 NOTE — Progress Notes (Signed)
  Subjective:    Patient ID: Joy Anderson, female    DOB: 07-25-1950, 60 y.o.   MRN: [id number]  HPI   See coumadin visit for today.  Review of Systems     Objective:   Physical Exam        Assessment & Plan:  Note, just took BP meds 1 hour agao.

## 2010-09-23 NOTE — Progress Notes (Signed)
Pt here for PT/INR check. BP elevated- pt states took BP med less than 1 hour ago

## 2010-10-08 ENCOUNTER — Ambulatory Visit: Payer: Self-pay | Admitting: Family Medicine

## 2010-10-08 ENCOUNTER — Ambulatory Visit (INDEPENDENT_AMBULATORY_CARE_PROVIDER_SITE_OTHER): Payer: BC Managed Care – PPO | Admitting: Family Medicine

## 2010-10-08 ENCOUNTER — Encounter: Payer: Self-pay | Admitting: Family Medicine

## 2010-10-08 VITALS — BP 154/94 | HR 76 | Wt 155.0 lb

## 2010-10-08 DIAGNOSIS — I82409 Acute embolism and thrombosis of unspecified deep veins of unspecified lower extremity: Secondary | ICD-10-CM

## 2010-10-08 DIAGNOSIS — I10 Essential (primary) hypertension: Secondary | ICD-10-CM

## 2010-10-08 DIAGNOSIS — L293 Anogenital pruritus, unspecified: Secondary | ICD-10-CM

## 2010-10-08 LAB — POCT INR: INR: 2.8

## 2010-10-08 MED ORDER — ATENOLOL 50 MG PO TABS: 50.0000 mg | ORAL_TABLET | Freq: Two times a day (BID) | ORAL | Status: DC

## 2010-10-08 NOTE — Progress Notes (Signed)
  Subjective:    Patient ID: Joy Anderson, female    DOB: 1950-04-09, 60 y.o.   MRN: [id number]  Hypertension This is a chronic problem. The current episode started more than 1 month ago. The problem has been gradually worsening since onset. The problem is controlled. Pertinent negatives include no chest pain or shortness of breath. There are no associated agents to hypertension. Past treatments include beta blockers and angiotensin blockers. The current treatment provides moderate improvement. There are no compliance problems.   Her prednisone his being gradually decreased.  BP yesterday was 130/90.    Review of Systems  Respiratory: Negative for shortness of breath.   Cardiovascular: Negative for chest pain.       Objective:   Physical Exam  Constitutional: She is oriented to person, place, and time. She appears well-developed and well-nourished.  HENT:  Head: Normocephalic and atraumatic.  Eyes: Pupils are equal, round, and reactive to light.       Right eye with severe slceral injection.   Neck: Neck supple. No thyromegaly present.  Cardiovascular: Normal rate, regular rhythm and normal heart sounds.   Pulmonary/Chest: Effort normal and breath sounds normal.  Lymphadenopathy:    She has no cervical adenopathy.  Neurological: She is alert and oriented to person, place, and time.  Skin: Skin is warm and dry.  Psychiatric: She has a normal mood and affect. Her behavior is normal.          Assessment & Plan:  Coumadin Check - see flow sheet for changes.   HTN - not well controlled. Will inc her atenolol to bid. Recheck in 3 weeks. Reminded her to really watch the salt in her diet. She says she is doing this.

## 2010-10-08 NOTE — Patient Instructions (Signed)
We can recheck your blood pressure when you come in for your coumadin check.

## 2010-10-16 LAB — COMPLETE METABOLIC PANEL WITH GFR
ALT: 19 U/L (ref 0–35)
AST: 14 U/L (ref 0–37)
Albumin: 2.6 g/dL — ABNORMAL LOW (ref 3.5–5.2)
CO2: 27 mEq/L (ref 19–32)
Calcium: 8.3 mg/dL — ABNORMAL LOW (ref 8.4–10.5)
Chloride: 107 mEq/L (ref 96–112)
Creat: 1.23 mg/dL — ABNORMAL HIGH (ref 0.50–1.10)
GFR, Est African American: 54 mL/min — ABNORMAL LOW (ref >60)
Potassium: 4 mEq/L (ref 3.5–5.3)
Sodium: 145 mEq/L (ref 135–145)
Total Protein: 4.9 g/dL — ABNORMAL LOW (ref 6.0–8.3)

## 2010-10-16 LAB — LIPID PANEL

## 2010-10-17 ENCOUNTER — Other Ambulatory Visit: Payer: Self-pay | Admitting: Family Medicine

## 2010-10-17 MED ORDER — OMEGA-3-ACID ETHYL ESTERS 1 G PO CAPS: 2.0000 g | ORAL_CAPSULE | Freq: Two times a day (BID) | ORAL | Status: DC

## 2010-10-29 ENCOUNTER — Ambulatory Visit (INDEPENDENT_AMBULATORY_CARE_PROVIDER_SITE_OTHER): Payer: BC Managed Care – PPO | Admitting: Family Medicine

## 2010-10-29 ENCOUNTER — Telehealth: Payer: Self-pay | Admitting: Family Medicine

## 2010-10-29 ENCOUNTER — Telehealth: Payer: Self-pay | Admitting: *Deleted

## 2010-10-29 VITALS — BP 176/103 | HR 73 | Wt 151.0 lb

## 2010-10-29 DIAGNOSIS — D6859 Other primary thrombophilia: Secondary | ICD-10-CM

## 2010-10-29 DIAGNOSIS — I82409 Acute embolism and thrombosis of unspecified deep veins of unspecified lower extremity: Secondary | ICD-10-CM

## 2010-10-29 DIAGNOSIS — R799 Abnormal finding of blood chemistry, unspecified: Secondary | ICD-10-CM

## 2010-10-29 DIAGNOSIS — D6861 Antiphospholipid syndrome: Secondary | ICD-10-CM

## 2010-10-29 LAB — POCT INR: INR: 2.2

## 2010-10-29 NOTE — Progress Notes (Signed)
  Subjective:    Patient ID: Joy Anderson, female    DOB: 1950-12-02, 60 y.o.   MRN: [id number] Pt here for PT/INR check. Pt currently taking Coumadin 5mg  everyday except Fridays is on 7mg  HPI    Review of Systems     Objective:   Physical Exam        Assessment & Plan:  BP is also high today. Increase atenolol to 100mg  bid.  Then make appt later this week or early next week. Pt reports BP has been up since started her prednisone.

## 2010-10-29 NOTE — Telephone Encounter (Signed)
[facility] called to verify pt's new dose of atenolol medication. Plan:  New regimen discussed with the pharmacist. Morene Rankins, LPN [facility]

## 2010-10-30 ENCOUNTER — Telehealth: Payer: Self-pay | Admitting: Family Medicine

## 2010-10-30 NOTE — Telephone Encounter (Signed)
Yesterday she was told to Increase atenolol to 100mg  bid. Then make appt later this week or early next week. Pt reports BP has been up since started her prednisone

## 2010-10-30 NOTE — Telephone Encounter (Signed)
Pt called and said she was recently seen and her attenolol was changed to a diff dose.  Pharmacyhas order that reads atenolol 50 mg PO twice daily, and the pt is saying she was instructed to take (2) 50 mg tabs PO twice daily. Please advise of the correct dose of atenolol.  I think the pt is right since it says 100mg  PO BID. Morene Rankins, LPN [facility]ne [facility]

## 2010-10-30 NOTE — Telephone Encounter (Signed)
Pharmacist Joellen Jersey) notified that atenolol should be 100 mg PO BID.  Pt informed dosing straightened out.  A follow up appt was scheduled for 11-08-2010. Morene Rankins, LPN [facility]ne [facility]

## 2010-11-04 ENCOUNTER — Other Ambulatory Visit: Payer: Self-pay | Admitting: *Deleted

## 2010-11-04 MED ORDER — WARFARIN SODIUM 5 MG PO TABS: 5.0000 mg | ORAL_TABLET | Freq: Every day | ORAL | Status: DC

## 2010-11-08 ENCOUNTER — Encounter: Payer: Self-pay | Admitting: Family Medicine

## 2010-11-08 ENCOUNTER — Telehealth: Payer: Self-pay | Admitting: Family Medicine

## 2010-11-08 ENCOUNTER — Telehealth: Payer: Self-pay | Admitting: *Deleted

## 2010-11-08 ENCOUNTER — Ambulatory Visit (INDEPENDENT_AMBULATORY_CARE_PROVIDER_SITE_OTHER): Payer: BC Managed Care – PPO | Admitting: Family Medicine

## 2010-11-08 VITALS — BP 160/100 | HR 88 | Wt 156.0 lb

## 2010-11-08 DIAGNOSIS — K219 Gastro-esophageal reflux disease without esophagitis: Secondary | ICD-10-CM

## 2010-11-08 DIAGNOSIS — I10 Essential (primary) hypertension: Secondary | ICD-10-CM

## 2010-11-08 MED ORDER — AMLODIPINE BESYLATE 5 MG PO TABS: 5.0000 mg | ORAL_TABLET | Freq: Every day | ORAL | Status: DC

## 2010-11-08 MED ORDER — RANITIDINE HCL 150 MG PO CAPS: 150.0000 mg | ORAL_CAPSULE | Freq: Two times a day (BID) | ORAL | Status: DC

## 2010-11-08 MED ORDER — ATENOLOL 100 MG PO TABS: 100.0000 mg | ORAL_TABLET | Freq: Two times a day (BID) | ORAL | Status: DC

## 2010-11-08 NOTE — Telephone Encounter (Signed)
Pt states she has taken amlodipine before and it caused her heart to beat funny. Please advise.

## 2010-11-08 NOTE — Progress Notes (Signed)
  Subjective:    Patient ID: Joy Anderson, female    DOB: August 27, 1950, 60 y.o.   MRN: [id number]  HPI Had chest pain last night. Felt like a burning. Normally no reflux. No meds.  Does feel better today. Pain was in the epigastric area.  No nausea or vomoting. No diarrrhea or loose stools.  No CP or SOB or dizziness. She is high risk for heartburn as she is on prednisone on and off.  Hypertension-she is here to followup on her blood pressure. We increased her atenolol to twice a day. She says she has been doing this and has tolerated it well without any side effects, or weakness or fatigue. Note her medication list as she uses her Lasix when necessary but she says she pretty much takes it every single day. In fact she wants to take 2 tablets occasionally for swelling in her ankles gets worse.  Review of Systems     Objective:   Physical Exam  Constitutional: She is oriented to person, place, and time. She appears well-developed and well-nourished.  HENT:  Head: Normocephalic and atraumatic.  Cardiovascular: Normal rate, regular rhythm and normal heart sounds.   Pulmonary/Chest: Effort normal and breath sounds normal.  Musculoskeletal: She exhibits edema.       bilat trace ankle edema.   Neurological: She is alert and oriented to person, place, and time.  Skin: Skin is warm and dry.  Psychiatric: She has a normal mood and affect. Her behavior is normal.          Assessment & Plan:  HTN - Still not well controlled. A little better though. Will add amlodipine to her regimen and have her followup in 4-6 weeks. Call if any prominent or concerns.  GERD-I would like her to start ranitidine twice a day and see if this improves her symptoms of the next week. If it is not relieving her heartburn then please call the office and let us know if it is then we will continue the medication for approximately 6-8 weeks and then wean her off.   She cannot any vaccines today because she was told to  hold off while she was on the cyclosporin.

## 2010-11-08 NOTE — Patient Instructions (Signed)
Really watch the salt in your diet Recheck your diabetes and blood pressure in one month.

## 2010-11-08 NOTE — Telephone Encounter (Signed)
Please call patient when I did sent her for a prescription for ranitidine to help with the heartburn symptoms but it sounds like she's been having. She can try it for a week and let me know if it's not helping. It is likely that the prednisone she's been taking is starting to irritate her stomach. This medication will help protect her stomach.

## 2010-11-08 NOTE — Telephone Encounter (Signed)
Pt aware.

## 2010-11-08 NOTE — Telephone Encounter (Signed)
I will increase her atenolol 100mg  bid instead. Keep f/u appt.

## 2010-11-11 MED ORDER — LOSARTAN POTASSIUM 50 MG PO TABS: 50.0000 mg | ORAL_TABLET | Freq: Every day | ORAL | Status: DC

## 2010-11-11 NOTE — Telephone Encounter (Signed)
Will add losartan to regimen. Make sure f/u for BP ion one month.

## 2010-11-11 NOTE — Telephone Encounter (Signed)
Pt aware.

## 2010-11-11 NOTE — Telephone Encounter (Signed)
On 10/30/10 Pt was instructed to take Atenolol 100mg  BID. Please advise.

## 2010-11-26 ENCOUNTER — Ambulatory Visit: Payer: BC Managed Care – PPO | Admitting: Family Medicine

## 2010-12-06 ENCOUNTER — Encounter: Payer: Self-pay | Admitting: Family Medicine

## 2010-12-09 ENCOUNTER — Ambulatory Visit (INDEPENDENT_AMBULATORY_CARE_PROVIDER_SITE_OTHER): Payer: BC Managed Care – PPO | Admitting: Family Medicine

## 2010-12-09 ENCOUNTER — Encounter: Payer: Self-pay | Admitting: Family Medicine

## 2010-12-09 ENCOUNTER — Ambulatory Visit: Payer: Self-pay | Admitting: Family Medicine

## 2010-12-09 VITALS — BP 142/86 | HR 91 | Wt 159.0 lb

## 2010-12-09 DIAGNOSIS — Z7901 Long term (current) use of anticoagulants: Secondary | ICD-10-CM

## 2010-12-09 DIAGNOSIS — D6861 Antiphospholipid syndrome: Secondary | ICD-10-CM

## 2010-12-09 DIAGNOSIS — Z5181 Encounter for therapeutic drug level monitoring: Secondary | ICD-10-CM

## 2010-12-09 DIAGNOSIS — I82409 Acute embolism and thrombosis of unspecified deep veins of unspecified lower extremity: Secondary | ICD-10-CM

## 2010-12-09 DIAGNOSIS — I10 Essential (primary) hypertension: Secondary | ICD-10-CM

## 2010-12-09 DIAGNOSIS — R799 Abnormal finding of blood chemistry, unspecified: Secondary | ICD-10-CM

## 2010-12-09 LAB — POCT INR: INR: 1.4

## 2010-12-09 MED ORDER — LOSARTAN POTASSIUM 100 MG PO TABS: 100.0000 mg | ORAL_TABLET | Freq: Every day | ORAL | Status: DC

## 2010-12-09 MED ORDER — DILTIAZEM HCL ER COATED BEADS 120 MG PO CP24: 120.0000 mg | ORAL_CAPSULE | Freq: Every day | ORAL | Status: DC

## 2010-12-09 NOTE — Progress Notes (Signed)
  Subjective:    Patient ID: Joy Anderson, female    DOB: 08/18/1950, 60 y.o.   MRN: [id number]  Hypertension This is a chronic problem. The current episode started more than 1 year ago. The problem has been gradually improving since onset. Pertinent negatives include no blurred vision, palpitations or shortness of breath. There are no associated agents to hypertension. Past treatments include angiotensin blockers, diuretics and beta blockers. The current treatment provides moderate improvement.      Review of Systems  Eyes: Negative for blurred vision.  Respiratory: Negative for shortness of breath.   Cardiovascular: Negative for palpitations.       Objective:   Physical Exam  Constitutional: She is oriented to person, place, and time. She appears well-developed and well-nourished.  HENT:  Head: Normocephalic and atraumatic.  Cardiovascular: Normal rate, regular rhythm and normal heart sounds.   Pulmonary/Chest: Effort normal and breath sounds normal.  Neurological: She is alert and oriented to person, place, and time.  Skin: Skin is warm and dry.  Psychiatric: She has a normal mood and affect. Her behavior is normal.          Assessment & Plan:  HTN - Much improved. Almost at goal today. I correct her prescription for losartan. Unfortunately she was being dispensed 50 mg tablets up to 100 mg tablets and had been taking 2. She has not tolerated amlodipine in the past but I would like to try diltiazem. We'll start on 20 mg a extended release capsule. Back in 6 weeks as her blood pressure is continuing to improve and hopefully will have reached her goal at that point in time. She takes Lasix almost daily.  Adjusted her coumadin level today. See Coumadin flow sheet.

## 2010-12-16 ENCOUNTER — Ambulatory Visit: Payer: BC Managed Care – PPO | Admitting: Family Medicine

## 2010-12-16 ENCOUNTER — Ambulatory Visit (INDEPENDENT_AMBULATORY_CARE_PROVIDER_SITE_OTHER): Payer: BC Managed Care – PPO | Admitting: Family Medicine

## 2010-12-16 VITALS — BP 155/96 | HR 75

## 2010-12-16 DIAGNOSIS — I2699 Other pulmonary embolism without acute cor pulmonale: Secondary | ICD-10-CM

## 2010-12-16 DIAGNOSIS — D6859 Other primary thrombophilia: Secondary | ICD-10-CM

## 2010-12-16 DIAGNOSIS — I82409 Acute embolism and thrombosis of unspecified deep veins of unspecified lower extremity: Secondary | ICD-10-CM

## 2010-12-16 DIAGNOSIS — D6861 Antiphospholipid syndrome: Secondary | ICD-10-CM

## 2010-12-16 DIAGNOSIS — R799 Abnormal finding of blood chemistry, unspecified: Secondary | ICD-10-CM

## 2010-12-16 NOTE — Progress Notes (Signed)
  Subjective:    Patient ID: Joy Anderson, female    DOB: 17-Dec-1950, 60 y.o.   MRN: [id number]  HPI INR/Prothrombin Time  Review of Systems     Objective:   Physical Exam        Assessment & Plan:

## 2010-12-23 ENCOUNTER — Ambulatory Visit (INDEPENDENT_AMBULATORY_CARE_PROVIDER_SITE_OTHER): Payer: BC Managed Care – PPO | Admitting: Family Medicine

## 2010-12-23 VITALS — BP 175/98 | HR 67

## 2010-12-23 DIAGNOSIS — I82409 Acute embolism and thrombosis of unspecified deep veins of unspecified lower extremity: Secondary | ICD-10-CM

## 2010-12-23 DIAGNOSIS — D6861 Antiphospholipid syndrome: Secondary | ICD-10-CM

## 2010-12-23 DIAGNOSIS — R799 Abnormal finding of blood chemistry, unspecified: Secondary | ICD-10-CM

## 2010-12-23 DIAGNOSIS — D6859 Other primary thrombophilia: Secondary | ICD-10-CM

## 2010-12-23 NOTE — Progress Notes (Signed)
Patient ID: Joy Anderson, female   DOB: [birth year], 60 y.o.   MRN: [id number] INR for coumadin therapy

## 2010-12-24 ENCOUNTER — Other Ambulatory Visit: Payer: Self-pay | Admitting: Family Medicine

## 2011-01-01 ENCOUNTER — Other Ambulatory Visit: Payer: Self-pay | Admitting: *Deleted

## 2011-01-01 MED ORDER — WARFARIN SODIUM 5 MG PO TABS: 5.0000 mg | ORAL_TABLET | Freq: Every day | ORAL | Status: DC

## 2011-01-05 ENCOUNTER — Other Ambulatory Visit: Payer: Self-pay | Admitting: Family Medicine

## 2011-01-06 ENCOUNTER — Telehealth: Payer: Self-pay | Admitting: *Deleted

## 2011-01-06 ENCOUNTER — Ambulatory Visit (INDEPENDENT_AMBULATORY_CARE_PROVIDER_SITE_OTHER): Payer: BC Managed Care – PPO | Admitting: Family Medicine

## 2011-01-06 DIAGNOSIS — D6861 Antiphospholipid syndrome: Secondary | ICD-10-CM

## 2011-01-06 DIAGNOSIS — D6859 Other primary thrombophilia: Secondary | ICD-10-CM

## 2011-01-06 DIAGNOSIS — I82409 Acute embolism and thrombosis of unspecified deep veins of unspecified lower extremity: Secondary | ICD-10-CM

## 2011-01-06 DIAGNOSIS — R799 Abnormal finding of blood chemistry, unspecified: Secondary | ICD-10-CM

## 2011-01-06 LAB — POCT INR: INR: 2

## 2011-01-06 MED ORDER — NYSTATIN-TRIAMCINOLONE 100000-0.1 UNIT/GM-% EX OINT: TOPICAL_OINTMENT | Freq: Two times a day (BID) | CUTANEOUS | Status: DC

## 2011-01-06 MED ORDER — AMBULATORY NON FORMULARY MEDICATION: Status: DC

## 2011-01-06 MED ORDER — GLUCOSE BLOOD VI STRP: ORAL_STRIP | Status: DC

## 2011-01-06 MED ORDER — AMLODIPINE BESYLATE 5 MG PO TABS: 5.0000 mg | ORAL_TABLET | Freq: Every day | ORAL | Status: DC

## 2011-01-06 NOTE — Progress Notes (Signed)
  Subjective:    Patient ID: Joy Anderson, female    DOB: 03-08-1950, 60 y.o.   MRN: [id number] PT/INR check HPI    Review of Systems     Objective:   Physical Exam        Assessment & Plan:

## 2011-01-06 NOTE — Telephone Encounter (Signed)
Pt brought in old box of Nystatin cream that she uses for rash on her bottom and would like refill sent to [facility].  MA, LPN

## 2011-01-06 NOTE — Telephone Encounter (Signed)
Okay to fill. Let me know if you have any problems ordering it.

## 2011-01-23 ENCOUNTER — Ambulatory Visit (INDEPENDENT_AMBULATORY_CARE_PROVIDER_SITE_OTHER): Payer: BC Managed Care – PPO | Admitting: Family Medicine

## 2011-01-23 ENCOUNTER — Other Ambulatory Visit: Payer: Self-pay | Admitting: *Deleted

## 2011-01-23 VITALS — BP 125/81 | HR 62

## 2011-01-23 DIAGNOSIS — D6861 Antiphospholipid syndrome: Secondary | ICD-10-CM

## 2011-01-23 DIAGNOSIS — I82409 Acute embolism and thrombosis of unspecified deep veins of unspecified lower extremity: Secondary | ICD-10-CM

## 2011-01-23 DIAGNOSIS — D6859 Other primary thrombophilia: Secondary | ICD-10-CM

## 2011-01-23 LAB — POCT INR: INR: 1.9

## 2011-01-23 MED ORDER — ATENOLOL 100 MG PO TABS: 100.0000 mg | ORAL_TABLET | Freq: Every day | ORAL | Status: DC

## 2011-01-23 NOTE — Progress Notes (Signed)
  Subjective:    Patient ID: Joy Anderson, female    DOB: 07/11/1950, 61 y.o.   MRN: [id number] INR check. HPI    Review of Systems     Objective:   Physical Exam        Assessment & Plan:

## 2011-01-23 NOTE — Progress Notes (Signed)
Pt notified of new Coumadin instructions.

## 2011-01-25 ENCOUNTER — Other Ambulatory Visit: Payer: Self-pay | Admitting: Family Medicine

## 2011-02-05 ENCOUNTER — Ambulatory Visit (INDEPENDENT_AMBULATORY_CARE_PROVIDER_SITE_OTHER): Payer: BC Managed Care – PPO | Admitting: Family Medicine

## 2011-02-05 VITALS — BP 175/93 | HR 65

## 2011-02-05 DIAGNOSIS — I82409 Acute embolism and thrombosis of unspecified deep veins of unspecified lower extremity: Secondary | ICD-10-CM

## 2011-02-05 DIAGNOSIS — D6859 Other primary thrombophilia: Secondary | ICD-10-CM

## 2011-02-05 DIAGNOSIS — D6861 Antiphospholipid syndrome: Secondary | ICD-10-CM

## 2011-02-05 NOTE — Progress Notes (Signed)
Patient ID: Joy Anderson, female   DOB: 1951/01/04, 60 y.o.   MRN: [id number] INR needed for coumadin therapy

## 2011-02-12 ENCOUNTER — Ambulatory Visit (INDEPENDENT_AMBULATORY_CARE_PROVIDER_SITE_OTHER): Payer: BC Managed Care – PPO | Admitting: Family Medicine

## 2011-02-12 VITALS — BP 168/88 | HR 65

## 2011-02-12 DIAGNOSIS — I82409 Acute embolism and thrombosis of unspecified deep veins of unspecified lower extremity: Secondary | ICD-10-CM

## 2011-02-12 DIAGNOSIS — D6861 Antiphospholipid syndrome: Secondary | ICD-10-CM

## 2011-02-12 DIAGNOSIS — I2699 Other pulmonary embolism without acute cor pulmonale: Secondary | ICD-10-CM

## 2011-02-12 LAB — POCT INR: INR: 2.6

## 2011-02-12 NOTE — Progress Notes (Signed)
Patient ID: Joy Anderson, female   DOB: 07/11/1950, 61 y.o.   MRN: [id number] INR need for coumadin therapy

## 2011-02-13 NOTE — Progress Notes (Signed)
Patient ID: Joy Anderson, female   DOB: Dec 28, 1950, 61 y.o.   MRN: [id number] Pt aware

## 2011-03-07 ENCOUNTER — Ambulatory Visit: Payer: Self-pay | Admitting: Family Medicine

## 2011-03-07 ENCOUNTER — Encounter: Payer: Self-pay | Admitting: Family Medicine

## 2011-03-07 ENCOUNTER — Ambulatory Visit (INDEPENDENT_AMBULATORY_CARE_PROVIDER_SITE_OTHER): Payer: BC Managed Care – PPO | Admitting: Family Medicine

## 2011-03-07 VITALS — BP 131/86 | HR 65 | Ht 62.0 in | Wt 174.0 lb

## 2011-03-07 DIAGNOSIS — I82409 Acute embolism and thrombosis of unspecified deep veins of unspecified lower extremity: Secondary | ICD-10-CM

## 2011-03-07 DIAGNOSIS — R21 Rash and other nonspecific skin eruption: Secondary | ICD-10-CM

## 2011-03-07 DIAGNOSIS — D6861 Antiphospholipid syndrome: Secondary | ICD-10-CM

## 2011-03-07 DIAGNOSIS — IMO0001 Reserved for inherently not codable concepts without codable children: Secondary | ICD-10-CM

## 2011-03-07 MED ORDER — METFORMIN HCL 500 MG PO TABS: 500.0000 mg | ORAL_TABLET | Freq: Two times a day (BID) | ORAL | Status: DC

## 2011-03-07 MED ORDER — DILTIAZEM HCL ER COATED BEADS 120 MG PO CP24: 120.0000 mg | ORAL_CAPSULE | Freq: Every day | ORAL | Status: AC

## 2011-03-07 NOTE — Progress Notes (Signed)
  Subjective:    Patient ID: Joy Anderson, female    DOB: 08-Mar-1950, 61 y.o.   MRN: [id number]  HPI Foot has been itching on and off for a couple of weeks.  Started out as a whilte bump and now has a hyperpigmented scaling rash on the iside of foot and over her 4th and 5th toes and the back of her heel. Says rash is spreading. Has been using neosporin, hydrocortisone cream, and antifugal foot cream. They seem to help the itche for awhile be then gets worse.   Diabetes-she has not been in since August for a diabetic checkup. She is on chronic steroids. She is not on any medications to manage her sugars. She is not getting any regular exercise. She has not really been checking her blood sugars.  Review of Systems     Objective:   Physical Exam  Constitutional: She is oriented to person, place, and time. She appears well-developed and well-nourished.  HENT:  Head: Normocephalic and atraumatic.  Cardiovascular: Normal rate, regular rhythm and normal heart sounds.   Pulmonary/Chest: Effort normal and breath sounds normal.  Neurological: She is alert and oriented to person, place, and time.  Skin: Skin is warm and dry.       She has about 4 scattered hyperpigmented papules on the inside of her left foot. Also over the area she has a dry scaling hyperpigmented rash that is well demarcated. It is also on her heel and near the fourth and fifth toes. She actually has 3 separate areas of the distinct rash. A scraping was done to be sent for a KOH to evaluate for fungus.  Psychiatric: She has a normal mood and affect. Her behavior is normal.          Assessment & Plan:  RAsh on foot. - looks fungal. Recommend  Antifungal at home and hold the hydrocortisone until I get the KOH back.  Really almost looks like ringworm.   Anticoagulation - See flow sheet.    DM- A1C today is 7.1.  Up from August.   She is not on anti-diabetic meds but is on chronic. prednisone. Last check was in 8/12.  Urine  micro collected today. NO sure if enough urine to run or not.  Can recollect if not. Work on diet and exercise. We reviewed this Start metformin 500 mg twice a day. If she has any problems or complications the please call the office. Otherwise followup 3 months.

## 2011-03-08 LAB — KOH PREP

## 2011-03-21 ENCOUNTER — Ambulatory Visit (INDEPENDENT_AMBULATORY_CARE_PROVIDER_SITE_OTHER): Payer: BC Managed Care – PPO | Admitting: Family Medicine

## 2011-03-21 VITALS — BP 136/78 | HR 69

## 2011-03-21 DIAGNOSIS — D6861 Antiphospholipid syndrome: Secondary | ICD-10-CM

## 2011-03-21 DIAGNOSIS — I82409 Acute embolism and thrombosis of unspecified deep veins of unspecified lower extremity: Secondary | ICD-10-CM

## 2011-03-21 LAB — POCT INR: INR: 2.4

## 2011-03-21 NOTE — Progress Notes (Signed)
  Subjective:    Patient ID: Joy Anderson, female    DOB: Jul 04, 1950, 61 y.o.   MRN: [id number] PT/INR HPI    Review of Systems     Objective:   Physical Exam        Assessment & Plan:

## 2011-03-21 NOTE — Progress Notes (Signed)
  Subjective:    Patient ID: Joy Anderson, female    DOB: 1950-03-07, 61 y.o.   MRN: [id number] Pt notified of instructions. KJ LPN HPI    Review of Systems     Objective:   Physical Exam        Assessment & Plan:

## 2011-04-15 ENCOUNTER — Telehealth: Payer: Self-pay | Admitting: *Deleted

## 2011-04-15 ENCOUNTER — Ambulatory Visit (INDEPENDENT_AMBULATORY_CARE_PROVIDER_SITE_OTHER): Payer: BC Managed Care – PPO | Admitting: Family Medicine

## 2011-04-15 VITALS — BP 145/97 | HR 69

## 2011-04-15 DIAGNOSIS — D6859 Other primary thrombophilia: Secondary | ICD-10-CM

## 2011-04-15 DIAGNOSIS — D6861 Antiphospholipid syndrome: Secondary | ICD-10-CM

## 2011-04-15 DIAGNOSIS — I82409 Acute embolism and thrombosis of unspecified deep veins of unspecified lower extremity: Secondary | ICD-10-CM

## 2011-04-15 NOTE — Progress Notes (Signed)
  Subjective:    Patient ID: Joy Anderson, female    DOB: [birth year], 61 y.o.   MRN: [id number] INR check HPI    Review of Systems     Objective:   Physical Exam        Assessment & Plan:

## 2011-04-15 NOTE — Telephone Encounter (Signed)
Pt notified of MD instructions. KJ LPN 

## 2011-04-23 LAB — CALCIUM

## 2011-04-23 LAB — BASIC METABOLIC PANEL: BUN: 31 mg/dL — AB (ref 4–21)

## 2011-04-23 LAB — CBC AND DIFFERENTIAL
HCT: 34 % — AB (ref 36–46)
Hemoglobin: 11.2 g/dL — AB (ref 12.0–16.0)

## 2011-04-28 ENCOUNTER — Ambulatory Visit (INDEPENDENT_AMBULATORY_CARE_PROVIDER_SITE_OTHER): Payer: BC Managed Care – PPO | Admitting: Family Medicine

## 2011-04-28 ENCOUNTER — Encounter: Payer: Self-pay | Admitting: *Deleted

## 2011-04-28 ENCOUNTER — Other Ambulatory Visit: Payer: Self-pay | Admitting: *Deleted

## 2011-04-28 VITALS — BP 128/85 | HR 73

## 2011-04-28 DIAGNOSIS — D6861 Antiphospholipid syndrome: Secondary | ICD-10-CM

## 2011-04-28 DIAGNOSIS — I82409 Acute embolism and thrombosis of unspecified deep veins of unspecified lower extremity: Secondary | ICD-10-CM

## 2011-04-28 DIAGNOSIS — D6859 Other primary thrombophilia: Secondary | ICD-10-CM

## 2011-04-28 LAB — POCT INR: INR: 3

## 2011-04-28 MED ORDER — ATENOLOL 100 MG PO TABS: 100.0000 mg | ORAL_TABLET | Freq: Every day | ORAL | Status: DC

## 2011-04-28 NOTE — Progress Notes (Signed)
  Subjective:    Patient ID: Joy Anderson, female    DOB: Sep 22, 1950, 61 y.o.   MRN: [id number] INR check. Pt states that she will be starting on an antibiotic from her kidney doctor today and will be on this for 12 weeks- FYI HPI    Review of Systems     Objective:   Physical Exam        Assessment & Plan:

## 2011-04-28 NOTE — Progress Notes (Signed)
  Subjective:    Patient ID: Joy Anderson, female    DOB: 1950-05-25, 61 y.o.   MRN: [id number] Pt notified of new Coumadin instructions. Kidney MD put pt on Cyclophosphamide 50mg  and sulfameth-primthoprim 800/165mg  1 tab every M, W, F HPI    Review of Systems     Objective:   Physical Exam        Assessment & Plan:

## 2011-05-12 ENCOUNTER — Ambulatory Visit (INDEPENDENT_AMBULATORY_CARE_PROVIDER_SITE_OTHER): Payer: BC Managed Care – PPO | Admitting: Family Medicine

## 2011-05-12 VITALS — BP 142/89 | HR 71

## 2011-05-12 DIAGNOSIS — D6859 Other primary thrombophilia: Secondary | ICD-10-CM

## 2011-05-12 DIAGNOSIS — I82409 Acute embolism and thrombosis of unspecified deep veins of unspecified lower extremity: Secondary | ICD-10-CM

## 2011-05-12 DIAGNOSIS — D6861 Antiphospholipid syndrome: Secondary | ICD-10-CM

## 2011-05-12 LAB — POCT INR: INR: 1.8

## 2011-05-12 NOTE — Progress Notes (Signed)
  Subjective:    Patient ID: Joy Anderson, female    DOB: 06/14/1950, 61 y.o.   MRN: [id number] INR check HPI    Review of Systems     Objective:   Physical Exam        Assessment & Plan:

## 2011-05-26 LAB — HEPATIC FUNCTION PANEL: ALT: 28 U/L (ref 7–35)

## 2011-05-26 LAB — BASIC METABOLIC PANEL: Creatinine: 8.3 mg/dL — AB (ref 0.5–1.1)

## 2011-05-27 ENCOUNTER — Ambulatory Visit (INDEPENDENT_AMBULATORY_CARE_PROVIDER_SITE_OTHER): Payer: BC Managed Care – PPO | Admitting: Family Medicine

## 2011-05-27 VITALS — BP 122/74 | HR 70

## 2011-05-27 DIAGNOSIS — D6859 Other primary thrombophilia: Secondary | ICD-10-CM

## 2011-05-27 DIAGNOSIS — I82409 Acute embolism and thrombosis of unspecified deep veins of unspecified lower extremity: Secondary | ICD-10-CM

## 2011-05-27 DIAGNOSIS — D6861 Antiphospholipid syndrome: Secondary | ICD-10-CM

## 2011-05-27 LAB — POCT INR: INR: 2

## 2011-05-27 NOTE — Progress Notes (Signed)
  Subjective:    Patient ID: Joy Anderson, female    DOB: 1950-08-02, 61 y.o.   MRN: [id number]  HPI    Review of Systems     Objective:   Physical Exam        Assessment & Plan:  Pt notified of results and new instructions. KJ LPN

## 2011-05-27 NOTE — Progress Notes (Signed)
  Subjective:    Patient ID: Joy Anderson, female    DOB: 1950-06-03, 61 y.o.   MRN: [id number] INR check HPI    Review of Systems     Objective:   Physical Exam        Assessment & Plan:

## 2011-05-30 ENCOUNTER — Encounter: Payer: Self-pay | Admitting: *Deleted

## 2011-06-09 ENCOUNTER — Encounter: Payer: Self-pay | Admitting: Family Medicine

## 2011-06-09 ENCOUNTER — Ambulatory Visit: Payer: Self-pay | Admitting: Family Medicine

## 2011-06-09 ENCOUNTER — Ambulatory Visit (INDEPENDENT_AMBULATORY_CARE_PROVIDER_SITE_OTHER): Payer: BC Managed Care – PPO | Admitting: Family Medicine

## 2011-06-09 VITALS — BP 126/81 | HR 70 | Wt 175.0 lb

## 2011-06-09 DIAGNOSIS — I82409 Acute embolism and thrombosis of unspecified deep veins of unspecified lower extremity: Secondary | ICD-10-CM

## 2011-06-09 DIAGNOSIS — Z7901 Long term (current) use of anticoagulants: Secondary | ICD-10-CM

## 2011-06-09 DIAGNOSIS — D6861 Antiphospholipid syndrome: Secondary | ICD-10-CM

## 2011-06-09 DIAGNOSIS — E119 Type 2 diabetes mellitus without complications: Secondary | ICD-10-CM

## 2011-06-09 DIAGNOSIS — I2699 Other pulmonary embolism without acute cor pulmonale: Secondary | ICD-10-CM

## 2011-06-09 DIAGNOSIS — I10 Essential (primary) hypertension: Secondary | ICD-10-CM

## 2011-06-09 DIAGNOSIS — N040 Nephrotic syndrome with minor glomerular abnormality: Secondary | ICD-10-CM

## 2011-06-09 LAB — POCT UA - MICROALBUMIN
Creatinine, POC: 200 mg/dL
Microalbumin Ur, POC: 150 mg/dL

## 2011-06-09 LAB — POCT GLYCOSYLATED HEMOGLOBIN (HGB A1C): Hemoglobin A1C: 6

## 2011-06-09 NOTE — Progress Notes (Signed)
  Subjective:    Patient ID: Joy Anderson, female    DOB: 06/05/1950, 61 y.o.   MRN: [id number]  HPI DM - doing well.  Says home sugars are mostly under 130.  Says will be able to lower the prednisone in July.  No lows.  N oCP or SOB.  Dec appetitite from cytoxan. Will be on prednisone for 6-12 months.    Review of Systems     Objective:   Physical Exam  Constitutional: She is oriented to person, place, and time. She appears well-developed and well-nourished.  HENT:  Head: Normocephalic and atraumatic.  Right Ear: External ear normal.  Left Ear: External ear normal.  Nose: Nose normal.  Mouth/Throat: Oropharynx is clear and moist.       TMs and canals are clear.   Eyes: Conjunctivae and EOM are normal. Pupils are equal, round, and reactive to light.  Neck: Neck supple. No thyromegaly present.  Cardiovascular: Normal rate, regular rhythm and normal heart sounds.   Pulmonary/Chest: Effort normal and breath sounds normal. She has no wheezes.  Lymphadenopathy:    She has no cervical adenopathy.  Neurological: She is alert and oriented to person, place, and time.  Skin: Skin is warm and dry.  Psychiatric: She has a normal mood and affect.          Assessment & Plan:  DM- Well controlled. continue metformin.  Hopefully we will be able to decrease her metformin once she comes down on her prednisone. Monofilament exam performed today. Followup in 3 months. Urine microalbumin was positive. She has a positive history for nephrotic syndrome.  Hypertension-well-controlled. Continue current regimen.  Anticoagulation-please see Coumadin flow sheet for adjustments. She wonders if she could come off of Coumadin. I explained to her that with her DVT she also is at risk because she has antiphospholipid antibody which puts her into a hypercoagulable state in addition to her autoimmune disorders. At this point in time based on current guidelines I do not think she would be the best candidate to  come off of Coumadin.

## 2011-06-09 NOTE — Patient Instructions (Signed)
Check to see when your last tetanus shot was

## 2011-06-23 ENCOUNTER — Ambulatory Visit (INDEPENDENT_AMBULATORY_CARE_PROVIDER_SITE_OTHER): Payer: BC Managed Care – PPO | Admitting: Family Medicine

## 2011-06-23 VITALS — BP 128/79 | HR 75

## 2011-06-23 DIAGNOSIS — D6861 Antiphospholipid syndrome: Secondary | ICD-10-CM

## 2011-06-23 DIAGNOSIS — D6859 Other primary thrombophilia: Secondary | ICD-10-CM

## 2011-06-23 DIAGNOSIS — I82409 Acute embolism and thrombosis of unspecified deep veins of unspecified lower extremity: Secondary | ICD-10-CM

## 2011-06-23 LAB — POCT INR: INR: 2.6

## 2011-06-23 MED ORDER — WARFARIN SODIUM 5 MG PO TABS: 5.0000 mg | ORAL_TABLET | Freq: Every day | ORAL | Status: DC

## 2011-06-23 NOTE — Progress Notes (Signed)
  Subjective:    Patient ID: Joy Anderson, female    DOB: 06/14/1950, 61 y.o.   MRN: [id number]  HPI    Review of Systems     Objective:   Physical Exam        Assessment & Plan:  Pt notified of MD instructions. KG LPN

## 2011-06-23 NOTE — Progress Notes (Signed)
  Subjective:    Patient ID: Joy Anderson, female    DOB: 04-28-1950, 61 y.o.   MRN: [id number] INR check HPI    Review of Systems     Objective:   Physical Exam        Assessment & Plan:

## 2011-06-25 LAB — HEPATIC FUNCTION PANEL
ALT: 47 U/L — AB (ref 7–35)
AST: 43 U/L — AB (ref 13–35)
Alkaline Phosphatase: 167 U/L — AB (ref 25–125)

## 2011-06-25 LAB — CBC AND DIFFERENTIAL: Hemoglobin: 8.8 g/dL — AB (ref 12.0–16.0)

## 2011-06-25 LAB — BASIC METABOLIC PANEL: Glucose: 123 mg/dL

## 2011-07-14 ENCOUNTER — Encounter: Payer: Self-pay | Admitting: *Deleted

## 2011-07-14 LAB — CHLORIDE
Calcium: 8.2 mg/dL
Protein, Total: 4.5
RDW: 18.7

## 2011-07-17 ENCOUNTER — Ambulatory Visit (INDEPENDENT_AMBULATORY_CARE_PROVIDER_SITE_OTHER): Payer: BC Managed Care – PPO | Admitting: Family Medicine

## 2011-07-17 VITALS — BP 121/78 | HR 84

## 2011-07-17 DIAGNOSIS — I82409 Acute embolism and thrombosis of unspecified deep veins of unspecified lower extremity: Secondary | ICD-10-CM

## 2011-07-17 DIAGNOSIS — D6861 Antiphospholipid syndrome: Secondary | ICD-10-CM

## 2011-07-17 DIAGNOSIS — D6859 Other primary thrombophilia: Secondary | ICD-10-CM

## 2011-07-17 LAB — POCT INR: INR: 2.2

## 2011-07-17 MED ORDER — METFORMIN HCL 500 MG PO TABS: 500.0000 mg | ORAL_TABLET | Freq: Two times a day (BID) | ORAL | Status: DC

## 2011-07-17 NOTE — Progress Notes (Signed)
Pt informed

## 2011-07-17 NOTE — Progress Notes (Signed)
  Subjective:    Patient ID: Joy Anderson, female    DOB: 05-Dec-1950, 61 y.o.   MRN: [id number] INR check HPI    Review of Systems     Objective:   Physical Exam        Assessment & Plan:

## 2011-08-14 ENCOUNTER — Ambulatory Visit (INDEPENDENT_AMBULATORY_CARE_PROVIDER_SITE_OTHER): Payer: BC Managed Care – PPO | Admitting: Family Medicine

## 2011-08-14 VITALS — BP 139/86 | HR 74

## 2011-08-14 DIAGNOSIS — D6861 Antiphospholipid syndrome: Secondary | ICD-10-CM

## 2011-08-14 DIAGNOSIS — I82409 Acute embolism and thrombosis of unspecified deep veins of unspecified lower extremity: Secondary | ICD-10-CM

## 2011-08-14 NOTE — Progress Notes (Signed)
  Subjective:    Patient ID: Joy Anderson, female    DOB: 27-Aug-1950, 61 y.o.   MRN: [id number] Pt denies any excessive bruising or bleeding. 5 mins spent with pt.  HPI    Review of Systems     Objective:   Physical Exam        Assessment & Plan:

## 2011-08-14 NOTE — Progress Notes (Signed)
Pt informed

## 2011-08-27 ENCOUNTER — Telehealth: Payer: Self-pay | Admitting: *Deleted

## 2011-08-27 ENCOUNTER — Ambulatory Visit (INDEPENDENT_AMBULATORY_CARE_PROVIDER_SITE_OTHER): Payer: BC Managed Care – PPO | Admitting: Family Medicine

## 2011-08-27 VITALS — BP 138/89 | HR 77

## 2011-08-27 DIAGNOSIS — D6861 Antiphospholipid syndrome: Secondary | ICD-10-CM

## 2011-08-27 DIAGNOSIS — I82409 Acute embolism and thrombosis of unspecified deep veins of unspecified lower extremity: Secondary | ICD-10-CM

## 2011-08-27 DIAGNOSIS — D6859 Other primary thrombophilia: Secondary | ICD-10-CM

## 2011-08-27 LAB — POCT INR: INR: 2.8

## 2011-08-27 NOTE — Progress Notes (Signed)
  Subjective:    Patient ID: Joy Anderson, female    DOB: March 14, 1950, 61 y.o.   MRN: [id number]  HPI  Pt denies any excessive bruising or bleeding. 5 mins spent with pt.   Review of Systems     Objective:   Physical Exam        Assessment & Plan:

## 2011-08-27 NOTE — Telephone Encounter (Signed)
patient notified

## 2011-09-08 ENCOUNTER — Other Ambulatory Visit: Payer: Self-pay | Admitting: Family Medicine

## 2011-09-12 ENCOUNTER — Ambulatory Visit (INDEPENDENT_AMBULATORY_CARE_PROVIDER_SITE_OTHER): Payer: BC Managed Care – PPO | Admitting: Family Medicine

## 2011-09-12 VITALS — BP 137/85 | HR 71

## 2011-09-12 DIAGNOSIS — D6859 Other primary thrombophilia: Secondary | ICD-10-CM

## 2011-09-12 DIAGNOSIS — I82409 Acute embolism and thrombosis of unspecified deep veins of unspecified lower extremity: Secondary | ICD-10-CM

## 2011-09-12 DIAGNOSIS — D6861 Antiphospholipid syndrome: Secondary | ICD-10-CM

## 2011-09-12 NOTE — Progress Notes (Signed)
LM on VM.

## 2011-09-12 NOTE — Progress Notes (Signed)
  Subjective:    Patient ID: Joy Anderson, female    DOB: 10/31/1950, 61 y.o.   MRN: [id number] Pt denies any excessive bruising or bleeding. 5 mins spent with pt.  HPI    Review of Systems     Objective:   Physical Exam        Assessment & Plan:

## 2011-09-22 ENCOUNTER — Encounter: Payer: Self-pay | Admitting: Family Medicine

## 2011-09-22 ENCOUNTER — Ambulatory Visit (INDEPENDENT_AMBULATORY_CARE_PROVIDER_SITE_OTHER): Payer: BC Managed Care – PPO | Admitting: Family Medicine

## 2011-09-22 VITALS — BP 148/86 | HR 71 | Wt 163.0 lb

## 2011-09-22 DIAGNOSIS — R229 Localized swelling, mass and lump, unspecified: Secondary | ICD-10-CM

## 2011-09-22 DIAGNOSIS — R2230 Localized swelling, mass and lump, unspecified upper limb: Secondary | ICD-10-CM

## 2011-09-22 DIAGNOSIS — E781 Pure hyperglyceridemia: Secondary | ICD-10-CM

## 2011-09-22 DIAGNOSIS — R599 Enlarged lymph nodes, unspecified: Secondary | ICD-10-CM

## 2011-09-22 DIAGNOSIS — E119 Type 2 diabetes mellitus without complications: Secondary | ICD-10-CM

## 2011-09-22 NOTE — Patient Instructions (Addendum)
Please remember to schedule your eye exam. 

## 2011-09-22 NOTE — Progress Notes (Signed)
  Subjective:    Patient ID: Joy Anderson, female    DOB: [birth year], 61 y.o.   MRN: [id number]  HPI DM- Sugars are about the same.  No hypoglycemic events.  No cuts or sores that are not healing well.  Decreased appetite and says food doesn't taste well. Has been going on for a few months.  Says after 4-5 bites she feels nauseated. Says the metformin makes it worse.    HTN - No CP or SOB. Takin gmeds regularly. Think due for labwork.   Lump on right upper arm 6 days ago and then noticed a bruise in that area yesterday. No trauma. No fever.  She denies any other lumps or bruises.     Review of Systems     Objective:   Physical Exam  Constitutional: She is oriented to person, place, and time. She appears well-developed and well-nourished.  HENT:  Head: Normocephalic and atraumatic.  Cardiovascular: Normal rate, regular rhythm and normal heart sounds.   Pulmonary/Chest: Effort normal and breath sounds normal.  Neurological: She is alert and oriented to person, place, and time.  Skin: Skin is warm and dry.       Right upper arm with a 1cm firm mobile mass just above her scar.  There is a bruise to the outer edge of the mass. Hyperpigmented lines, horizontal, along nail beds.    Psychiatric: She has a normal mood and affect. Her behavior is normal.          Assessment & Plan:  DM-will stop the metformin.  Off med for 2 weeks. If still feeling nauseated after eating then asked he rto call office and we can refer to [facility].  Great work. Repeat A1C in 3 months off med since doing so well.   HTN- Uncontrolled today. Has lost 10 lbs. Repeat BP in one month.    hypertrygleridemia -Repeat lipids. Say can't tolerate fish oil.  May need to consider lovaza.    Lump in arm- Will check CBC w diff. Will check liver function and INR since briusing around it.  Hx of SLE and reynauds.

## 2011-09-23 ENCOUNTER — Other Ambulatory Visit: Payer: Self-pay | Admitting: Family Medicine

## 2011-09-23 LAB — CBC WITH DIFFERENTIAL/PLATELET
Basophils Absolute: 0 10*3/uL (ref 0.0–0.1)
Basophils Relative: 1 % (ref 0–1)
Hemoglobin: 9.3 g/dL — ABNORMAL LOW (ref 12.0–15.0)
MCHC: 33.2 g/dL (ref 30.0–36.0)
Neutro Abs: 1.4 10*3/uL — ABNORMAL LOW (ref 1.7–7.7)
Neutrophils Relative %: 36 % — ABNORMAL LOW (ref 43–77)
RDW: 15.2 % (ref 11.5–15.5)

## 2011-09-23 LAB — LIPID PANEL
HDL: 45 mg/dL (ref >39)
Triglycerides: 716 mg/dL — ABNORMAL HIGH (ref <150)

## 2011-09-23 LAB — COMPLETE METABOLIC PANEL WITH GFR
ALT: 15 U/L (ref 0–35)
AST: 27 U/L (ref 0–37)
Alkaline Phosphatase: 167 U/L — ABNORMAL HIGH (ref 39–117)
GFR, Est Non African American: 39 mL/min — ABNORMAL LOW
Sodium: 141 mEq/L (ref 135–145)
Total Bilirubin: 0.2 mg/dL — ABNORMAL LOW (ref 0.3–1.2)
Total Protein: 6.2 g/dL (ref 6.0–8.3)

## 2011-09-23 LAB — SEDIMENTATION RATE: Sed Rate: 134 mm/hr — ABNORMAL HIGH (ref 0–22)

## 2011-09-23 MED ORDER — OMEGA-3-ACID ETHYL ESTERS 1 G PO CAPS: 2.0000 g | ORAL_CAPSULE | Freq: Two times a day (BID) | ORAL | Status: DC

## 2011-09-24 ENCOUNTER — Other Ambulatory Visit: Payer: Self-pay | Admitting: Family Medicine

## 2011-09-24 DIAGNOSIS — D696 Thrombocytopenia, unspecified: Secondary | ICD-10-CM

## 2011-09-24 DIAGNOSIS — D649 Anemia, unspecified: Secondary | ICD-10-CM

## 2011-09-24 DIAGNOSIS — D709 Neutropenia, unspecified: Secondary | ICD-10-CM

## 2011-09-29 ENCOUNTER — Other Ambulatory Visit: Payer: Self-pay | Admitting: Family Medicine

## 2011-09-29 DIAGNOSIS — K760 Fatty (change of) liver, not elsewhere classified: Secondary | ICD-10-CM

## 2011-09-29 DIAGNOSIS — R748 Abnormal levels of other serum enzymes: Secondary | ICD-10-CM

## 2011-09-30 ENCOUNTER — Ambulatory Visit (INDEPENDENT_AMBULATORY_CARE_PROVIDER_SITE_OTHER): Payer: BC Managed Care – PPO

## 2011-09-30 DIAGNOSIS — R748 Abnormal levels of other serum enzymes: Secondary | ICD-10-CM

## 2011-09-30 DIAGNOSIS — K8020 Calculus of gallbladder without cholecystitis without obstruction: Secondary | ICD-10-CM

## 2011-09-30 DIAGNOSIS — K760 Fatty (change of) liver, not elsewhere classified: Secondary | ICD-10-CM

## 2011-10-01 ENCOUNTER — Other Ambulatory Visit: Payer: Self-pay | Admitting: Family Medicine

## 2011-10-01 DIAGNOSIS — K819 Cholecystitis, unspecified: Secondary | ICD-10-CM

## 2011-10-06 ENCOUNTER — Telehealth: Payer: Self-pay | Admitting: Hematology & Oncology

## 2011-10-06 ENCOUNTER — Other Ambulatory Visit: Payer: Self-pay | Admitting: Family Medicine

## 2011-10-06 NOTE — Telephone Encounter (Signed)
Pt called to schedule new patient appointment I asked her if it mattered what day of the week and she asked where we are located I told her and she said she would call back to schedule appointment

## 2011-10-07 ENCOUNTER — Ambulatory Visit (INDEPENDENT_AMBULATORY_CARE_PROVIDER_SITE_OTHER): Payer: BC Managed Care – PPO | Admitting: Family Medicine

## 2011-10-07 ENCOUNTER — Telehealth: Payer: Self-pay | Admitting: *Deleted

## 2011-10-07 VITALS — BP 149/90 | HR 69

## 2011-10-07 DIAGNOSIS — D6859 Other primary thrombophilia: Secondary | ICD-10-CM

## 2011-10-07 DIAGNOSIS — I82409 Acute embolism and thrombosis of unspecified deep veins of unspecified lower extremity: Secondary | ICD-10-CM

## 2011-10-07 DIAGNOSIS — D6861 Antiphospholipid syndrome: Secondary | ICD-10-CM

## 2011-10-07 LAB — POCT INR: INR: 2.8

## 2011-10-07 NOTE — Telephone Encounter (Signed)
Pt notified about INR results and instructions

## 2011-10-13 ENCOUNTER — Telehealth: Payer: Self-pay | Admitting: *Deleted

## 2011-10-13 NOTE — Telephone Encounter (Signed)
Pt called and states she has a hematologist here in [place], Dr. Para March that she would like to see instead of the one referred to in [place].Marland Kitchen Phone num [phone]

## 2011-10-13 NOTE — Telephone Encounter (Signed)
Can send to [facility].  The other one wasn't in [place] it was 8 miles from here in [place].

## 2011-10-27 ENCOUNTER — Telehealth: Payer: Self-pay | Admitting: Hematology & Oncology

## 2011-10-27 NOTE — Telephone Encounter (Signed)
Pt made 11-21 appointment could only come between 11 am and 2 pm

## 2011-10-29 LAB — ESTIMATED GFR
Albumin: 2
Calcium: 8.1 mg/dL
Chloride, Serum: 111

## 2011-10-30 ENCOUNTER — Encounter: Payer: Self-pay | Admitting: Family Medicine

## 2011-10-30 ENCOUNTER — Ambulatory Visit (INDEPENDENT_AMBULATORY_CARE_PROVIDER_SITE_OTHER): Payer: BC Managed Care – PPO | Admitting: Family Medicine

## 2011-10-30 VITALS — BP 149/93 | HR 73 | Temp 98.5°F | Ht 62.0 in | Wt 159.0 lb

## 2011-10-30 DIAGNOSIS — J069 Acute upper respiratory infection, unspecified: Secondary | ICD-10-CM

## 2011-10-30 DIAGNOSIS — J40 Bronchitis, not specified as acute or chronic: Secondary | ICD-10-CM

## 2011-10-30 DIAGNOSIS — I82409 Acute embolism and thrombosis of unspecified deep veins of unspecified lower extremity: Secondary | ICD-10-CM

## 2011-10-30 DIAGNOSIS — D6861 Antiphospholipid syndrome: Secondary | ICD-10-CM

## 2011-10-30 DIAGNOSIS — D6859 Other primary thrombophilia: Secondary | ICD-10-CM

## 2011-10-30 LAB — POCT INR: INR: 1.7

## 2011-10-30 MED ORDER — MOXIFLOXACIN HCL 400 MG PO TABS: 400.0000 mg | ORAL_TABLET | Freq: Every day | ORAL | Status: AC

## 2011-10-30 MED ORDER — BENZONATATE 200 MG PO CAPS: 200.0000 mg | ORAL_CAPSULE | Freq: Three times a day (TID) | ORAL | Status: DC | PRN

## 2011-10-30 NOTE — Progress Notes (Signed)
  Subjective:    Patient ID: Joy Anderson, female    DOB: [birth year], 61 y.o.   MRN: [id number]  URI  This is a new problem. The current episode started in the past 7 days. The problem has been gradually worsening. There has been no fever. Associated symptoms include chest pain, congestion, coughing, joint pain, joint swelling, a rash, rhinorrhea and sneezing. Pertinent negatives include no abdominal pain, diarrhea, dysuria, ear pain, headaches, nausea, neck pain, plugged ear sensation, sinus pain, sore throat, swollen glands, vomiting or wheezing.   #2 INR patient had INR done today it was 1.7 but will need warfarin to be adjusted  Review of Systems  HENT: Positive for congestion, rhinorrhea and sneezing. Negative for ear pain, sore throat and neck pain.   Respiratory: Positive for cough. Negative for wheezing.   Cardiovascular: Positive for chest pain.  Gastrointestinal: Negative for nausea, vomiting, abdominal pain and diarrhea.  Genitourinary: Negative for dysuria.  Musculoskeletal: Positive for joint pain.  Skin: Positive for rash.  Neurological: Negative for headaches.      BP 149/93  Pulse 73  Temp 98.5 F (36.9 C) (Oral)  Ht 5\' 2"  (1.575 m)  Wt 159 lb (72.122 kg)  BMI 29.08 kg/m2  SpO2 96%  LMP 03/18/1999 Objective:   Physical Exam  Vitals reviewed. Constitutional: She is oriented to person, place, and time. She appears well-developed and well-nourished.  HENT:  Head: Normocephalic and atraumatic.  Nose: Rhinorrhea present. No sinus tenderness or nasal deformity. Right sinus exhibits no maxillary sinus tenderness and no frontal sinus tenderness. Left sinus exhibits no maxillary sinus tenderness and no frontal sinus tenderness.  Eyes: Pupils are equal, round, and reactive to light.  Neck: Normal range of motion. Neck supple. Carotid bruit is not present. No tracheal deviation present. Thyromegaly present.  Cardiovascular: Normal rate, regular rhythm and normal heart  sounds.   Pulmonary/Chest: Effort normal and breath sounds normal. No respiratory distress. She has no wheezes.  Lymphadenopathy:    She has cervical adenopathy.  Neurological: She is alert and oriented to person, place, and time.  Skin: Skin is warm and dry.    Results for orders placed in visit on 10/30/11  POCT INR      Component Value Range   INR 1.7         Assessment & Plan:  #1 Bronchitis.initially consider placing on Augmentin and Tussionex for because of the cost of medications and having a discount card available for Avelox we will instead go with Avelox and , Mucinex  DM , tessalon perles 200. #2 abnormal INR. We will increase her warfarin to 7.5 on Sunday making everyday now 7.5 except for Tuesdays and Thursdays. Will have her return in week for recheck of INR #3 flu vaccination offered patient declined at this time.  Return PR

## 2011-10-30 NOTE — Patient Instructions (Signed)

## 2011-11-05 ENCOUNTER — Encounter: Payer: Self-pay | Admitting: *Deleted

## 2011-11-06 ENCOUNTER — Ambulatory Visit (INDEPENDENT_AMBULATORY_CARE_PROVIDER_SITE_OTHER): Payer: BC Managed Care – PPO | Admitting: Family Medicine

## 2011-11-06 ENCOUNTER — Ambulatory Visit: Payer: Self-pay | Admitting: Family Medicine

## 2011-11-06 ENCOUNTER — Encounter: Payer: Self-pay | Admitting: Family Medicine

## 2011-11-06 VITALS — BP 125/79 | HR 64 | Ht 62.0 in | Wt 159.0 lb

## 2011-11-06 DIAGNOSIS — J40 Bronchitis, not specified as acute or chronic: Secondary | ICD-10-CM

## 2011-11-06 DIAGNOSIS — D6861 Antiphospholipid syndrome: Secondary | ICD-10-CM

## 2011-11-06 DIAGNOSIS — I82409 Acute embolism and thrombosis of unspecified deep veins of unspecified lower extremity: Secondary | ICD-10-CM

## 2011-11-06 NOTE — Progress Notes (Signed)
  Subjective:    Patient ID: VEGAS RITTNER, female    DOB: Dec 15, 1950, 61 y.o.   MRN: [id number]  HPI She's here today to follow bronchitis. She's on partner Dr. Alveta Heimlich for acute bronchitis. She was placed on Avelox. She says she's feeling completely better. No shortness of breath or cough. No fever. She did complete the antibiotic.  Anticoagulation for DVT-she's taking her Coumadin as prescribed. No easy bleeding or bruising.   Review of Systems     Objective:   Physical Exam  Constitutional: She is oriented to person, place, and time. She appears well-developed and well-nourished.  HENT:  Head: Normocephalic and atraumatic.  Cardiovascular: Normal rate, regular rhythm and normal heart sounds.   Pulmonary/Chest: Effort normal and breath sounds normal.  Neurological: She is alert and oriented to person, place, and time. She has normal reflexes.  Skin: Skin is warm and dry.  Psychiatric: She has a normal mood and affect. Her behavior is normal.          Assessment & Plan:  Bronchitis-resolved. She's doing fantastic. Call if she feels she's getting worse. I did offer her a flu vaccine today but she declined and said she will think about getting it next week when she comes in for next Coumadin check.  Anticoagulation-see flowsheet. Repeat in one week.

## 2011-11-13 ENCOUNTER — Ambulatory Visit (INDEPENDENT_AMBULATORY_CARE_PROVIDER_SITE_OTHER): Payer: BC Managed Care – PPO | Admitting: Family Medicine

## 2011-11-13 VITALS — BP 159/93 | HR 65

## 2011-11-13 DIAGNOSIS — D6861 Antiphospholipid syndrome: Secondary | ICD-10-CM

## 2011-11-13 DIAGNOSIS — I82409 Acute embolism and thrombosis of unspecified deep veins of unspecified lower extremity: Secondary | ICD-10-CM

## 2011-11-13 DIAGNOSIS — D6859 Other primary thrombophilia: Secondary | ICD-10-CM

## 2011-11-13 LAB — POCT INR: INR: 2.9

## 2011-11-14 ENCOUNTER — Telehealth: Payer: Self-pay | Admitting: *Deleted

## 2011-11-14 NOTE — Telephone Encounter (Signed)
Left message yesterday on pt's vm with coumadin instructions

## 2011-11-27 ENCOUNTER — Other Ambulatory Visit (HOSPITAL_BASED_OUTPATIENT_CLINIC_OR_DEPARTMENT_OTHER): Payer: BC Managed Care – PPO | Admitting: Lab

## 2011-11-27 ENCOUNTER — Ambulatory Visit: Payer: BC Managed Care – PPO

## 2011-11-27 ENCOUNTER — Ambulatory Visit (HOSPITAL_BASED_OUTPATIENT_CLINIC_OR_DEPARTMENT_OTHER): Payer: BC Managed Care – PPO | Admitting: Hematology & Oncology

## 2011-11-27 VITALS — BP 193/81 | HR 67 | Temp 97.5°F | Resp 16 | Ht 62.0 in | Wt 162.0 lb

## 2011-11-27 DIAGNOSIS — D61818 Other pancytopenia: Secondary | ICD-10-CM

## 2011-11-27 DIAGNOSIS — D509 Iron deficiency anemia, unspecified: Secondary | ICD-10-CM

## 2011-11-27 DIAGNOSIS — D649 Anemia, unspecified: Secondary | ICD-10-CM

## 2011-11-27 DIAGNOSIS — N289 Disorder of kidney and ureter, unspecified: Secondary | ICD-10-CM

## 2011-11-27 LAB — CBC WITH DIFFERENTIAL (CANCER CENTER ONLY)
BASO#: 0 10*3/uL (ref 0.0–0.2)
EOS%: 2.3 % (ref 0.0–7.0)
Eosinophils Absolute: 0.1 10*3/uL (ref 0.0–0.5)
HCT: 28.6 % — ABNORMAL LOW (ref 34.8–46.6)
HGB: 9.6 g/dL — ABNORMAL LOW (ref 11.6–15.9)
LYMPH#: 1 10*3/uL (ref 0.9–3.3)
MCH: 27.4 pg (ref 26.0–34.0)
MCHC: 33.6 g/dL (ref 32.0–36.0)
MONO%: 7.4 % (ref 0.0–13.0)
NEUT%: 67.2 % (ref 39.6–80.0)
RBC: 3.51 10*6/uL — ABNORMAL LOW (ref 3.70–5.32)

## 2011-11-27 NOTE — Progress Notes (Signed)
This office note has been dictated.

## 2011-11-28 LAB — VITAMIN B12: Vitamin B-12: 410 pg/mL (ref 211–911)

## 2011-11-28 LAB — COMPREHENSIVE METABOLIC PANEL
AST: 18 U/L (ref 0–37)
Albumin: 2.1 g/dL — ABNORMAL LOW (ref 3.5–5.2)
Alkaline Phosphatase: 153 U/L — ABNORMAL HIGH (ref 39–117)
BUN: 18 mg/dL (ref 6–23)
Glucose, Bld: 101 mg/dL — ABNORMAL HIGH (ref 70–99)
Potassium: 3.3 mEq/L — ABNORMAL LOW (ref 3.5–5.3)
Total Bilirubin: 0.2 mg/dL — ABNORMAL LOW (ref 0.3–1.2)

## 2011-11-28 LAB — IRON AND TIBC
Iron: 57 ug/dL (ref 42–145)
TIBC: 160 ug/dL — ABNORMAL LOW (ref 250–470)
UIBC: 103 ug/dL — ABNORMAL LOW (ref 125–400)

## 2011-11-28 LAB — RETICULOCYTES (CHCC): Retic Ct Pct: 1.1 % (ref 0.4–2.3)

## 2011-11-28 NOTE — Progress Notes (Signed)
CC:   Joy Anderson, M.D.  DIAGNOSES: 1. Transient pancytopenia. 2. Lupus. 3. Minimal change nephropathy.  HISTORY OF PRESENT ILLNESS:  Joy Anderson is a very nice 61 year old African American female.  She follows with Dr. Madilyn Fireman.  She has lupus. She apparently had been diagnosed with this for 3 years.  She had been on Cytoxan.  She has minimal change nephropathy. Her last Cytoxan dose I think was back in July.  She has had lab work done on a regular basis.  Dr. Madilyn Fireman saw that there were some issues with her blood counts with some pancytopenia and as such felt that a hematologic evaluation was indicated.  As such, Joy Anderson was kindly referred to the [facility] for an evaluation.  Going through the records kindly sent by Dr. Madilyn Fireman, 2 years ago white cell count was 4.1, hemoglobin 9.4, hematocrit 29.9, platelet count was 451.  In February of 2012, white cell count was 3.5, hemoglobin 10.4 and platelet count was 310.  The most recent lab work from Dr. Gardiner Ramus office was in September of this year.  This showed a white cell count of 3.8, hemoglobin 9.3, hematocrit 28 and platelet count was 124.  Dr. Madilyn Fireman also checked electrolytes.  Electrolytes showed a BUN of 13, creatinine 1.43.  She does have renal insufficiency.  She had a markedly elevated sedimentation rate.  Joy Anderson has not had any problem with infections.  There have been no rashes.  She has had occasional headache.  She has had no dysphagia or odynophagia.  She has not noted arthralgias.  There has been no change in bowel or bladder habits.  Again, we were asked to see her to help with the pancytopenia.  PAST MEDICAL HISTORY:  Remarkable for: 1. Lupus. 2. Minimal change nephropathy. 3. Diabetes. 4. Pulmonary hypertension. 5. Raynaud's disease. 6. Antiphospholipid antibody syndrome with DVT in the left lower leg 2     years ago.  ALLERGIES: 1. ACE inhibitors. 2.  Norvasc. 3. Fish oil. 4. Metformin.  MEDICATIONS: 1. Norvasc 5 mg p.o. daily. 2. Tenormin 100 mg p.o. b.i.d. 3. Cardizem CD 120 mg p.o. daily. 4. Lasix 20 mg p.o. daily. 5. Plaquenil 200 mg p.o. b.i.d. 6. Cozaar 100 mg p.o. daily. 7. Coumadin 5 mg p.o. daily.  SOCIAL HISTORY:  Negative for tobacco use.  There is no alcohol use. She has no obvious occupational exposures.  FAMILY HISTORY:  Remarkable for her daughter with lupus.  REVIEW OF SYSTEMS:  As stated in history of present illness.  No additional findings are noted on a 12 system review.  PHYSICAL EXAMINATION:  General:  This is a well-developed, well- nourished African American female in no obvious distress.  Vital signs: Show temperature of 97.5, pulse 67, respiratory rate 16, blood pressure 193/81.  Weight is 162.  Head and neck:  Normocephalic, atraumatic skull.  There are no ocular or oral lesions.  There are no palpable cervical or supraclavicular lymph nodes.  Lungs:  Clear to percussion and auscultation bilaterally.  Cardiac:  Regular rate and rhythm with a normal S1 and S2.  There are no murmurs, rubs or bruits.  Abdomen:  Soft with good bowel sounds.  There is no palpable abdominal mass.  There is no fluid wave.  There is no palpable hepatosplenomegaly.  Extremities: Shows no clubbing, cyanosis or edema.  No swelling is noted in the legs. No palpable venous cord is noted in the legs.  Neurological:  Shows no focal neurological deficits.  Skin:  No rashes, ecchymoses or petechiae.  LABORATORY STUDIES:  Show a white cell count of 4.3, hemoglobin 9.6, hematocrit 28.6, platelet count 159.  Ferritin is 62.  B12 is 410.  Peripheral smear shows some mild anisocytosis.  She has no nucleated red blood cells.  There are no target cells.  I see no rouleaux formation. She has no teardrop cells.  I see no schistocytes or spherocytes.  White cells are normal in morphology and maturation.  There are no immature myeloid  cells.  There are no atypical lymphocytes.  I see no hypersegmented polys.  Platelets are adequate in number and size.  IMPRESSION:  Joy Anderson is a 61 year old African American female with transient pancytopenia.  She now has anemia.  I think this is going to be multifactorial in etiology.  Of note, her ferritin of 62, in my mind is low for her.  I would expect her ferritin to be a lot higher as this is an acute phase reactant.  As such, I suspect that she does have some iron deficiency anemia.  She does have renal insufficiency.  We will see what her erythropoietin level is.  I suspect that this will be low.  I believe that the fact that she is on Cytoxan affected her white cell count and platelet count.  She is off this now.  I do not suspect any underlying hematologic malignancy.  Her blood counts seem to be improving with her off the Cytoxan.  Lupus itself can cause pancytopenia.  She does not have a flareup right now.  However, lupus definitely is in the differential for her pancytopenia.  I believe that giving her iron will help to some degree.  Hopefully, I suspect that we may have to look at Aranesp or Procrit to really get her hemoglobin back up.  I do not see a need for a bone marrow biopsy.  I had a very nice time with Joy Anderson.  I gave her a prayer blanket. She was very appreciative of this.  I will see about getting iron to her after Thanksgiving.  I want to see her back after New Year's.  This will be I think a very key appointment in which we can see how her CBC looks after her getting iron back.  I spent a good hour or more with Joy Anderson today.    ______________________________ Volanda Napoleon, M.D. PRE/MEDQ  D:  11/27/2011  T:  11/28/2011  Job:  [id number]

## 2011-12-09 ENCOUNTER — Ambulatory Visit (INDEPENDENT_AMBULATORY_CARE_PROVIDER_SITE_OTHER): Payer: BC Managed Care – PPO | Admitting: Family Medicine

## 2011-12-09 VITALS — BP 141/85 | HR 71

## 2011-12-09 DIAGNOSIS — D6859 Other primary thrombophilia: Secondary | ICD-10-CM

## 2011-12-09 DIAGNOSIS — I82409 Acute embolism and thrombosis of unspecified deep veins of unspecified lower extremity: Secondary | ICD-10-CM

## 2011-12-09 DIAGNOSIS — D6861 Antiphospholipid syndrome: Secondary | ICD-10-CM

## 2011-12-09 LAB — POCT INR: INR: 3.2

## 2011-12-09 NOTE — Progress Notes (Signed)
  Subjective:    Patient ID: Joy Anderson, female    DOB: 1950/09/10, 61 y.o.   MRN: [id number] Pt denies any excessive bruising or bleeding. 5 mins spent with pt. HPI    Review of Systems     Objective:   Physical Exam        Assessment & Plan:

## 2011-12-23 ENCOUNTER — Ambulatory Visit: Payer: BC Managed Care – PPO | Admitting: Family Medicine

## 2011-12-26 ENCOUNTER — Ambulatory Visit (INDEPENDENT_AMBULATORY_CARE_PROVIDER_SITE_OTHER): Payer: BC Managed Care – PPO | Admitting: Family Medicine

## 2011-12-26 VITALS — BP 134/83 | HR 67

## 2011-12-26 DIAGNOSIS — I82409 Acute embolism and thrombosis of unspecified deep veins of unspecified lower extremity: Secondary | ICD-10-CM

## 2011-12-26 DIAGNOSIS — D6859 Other primary thrombophilia: Secondary | ICD-10-CM

## 2011-12-26 DIAGNOSIS — D6861 Antiphospholipid syndrome: Secondary | ICD-10-CM

## 2011-12-26 LAB — POCT INR: INR: 2.3

## 2011-12-26 NOTE — Progress Notes (Signed)
  Subjective:    Patient ID: Joy Anderson, female    DOB: 10-30-1950, 61 y.o.   MRN: [id number] Pt denies any excessive bruising or bleeding. 5 mins spent with pt. HPI    Review of Systems     Objective:   Physical Exam        Assessment & Plan:

## 2011-12-26 NOTE — Progress Notes (Signed)
  Subjective:    Patient ID: Joy Anderson, female    DOB: 1950-04-08, 61 y.o.   MRN: [id number]  HPI    Review of Systems     Objective:   Physical Exam        Assessment & Plan:  Pt notified of MD instructions.

## 2012-01-03 ENCOUNTER — Other Ambulatory Visit: Payer: Self-pay | Admitting: Family Medicine

## 2012-01-06 ENCOUNTER — Ambulatory Visit: Payer: Self-pay | Admitting: Family Medicine

## 2012-01-12 ENCOUNTER — Telehealth: Payer: Self-pay | Admitting: Hematology & Oncology

## 2012-01-12 NOTE — Telephone Encounter (Signed)
Patient cx 01/22/12 apt and resch for 01/30/12

## 2012-01-15 ENCOUNTER — Encounter: Payer: Self-pay | Admitting: *Deleted

## 2012-01-19 ENCOUNTER — Ambulatory Visit: Payer: Self-pay | Admitting: Family Medicine

## 2012-01-22 ENCOUNTER — Other Ambulatory Visit: Payer: BC Managed Care – PPO | Admitting: Lab

## 2012-01-22 ENCOUNTER — Ambulatory Visit: Payer: BC Managed Care – PPO | Admitting: Hematology & Oncology

## 2012-01-26 ENCOUNTER — Encounter: Payer: Self-pay | Admitting: Family Medicine

## 2012-01-27 ENCOUNTER — Telehealth: Payer: Self-pay | Admitting: Hematology & Oncology

## 2012-01-27 NOTE — Telephone Encounter (Signed)
Patient called and cx 01/30/12 apt.  When asked to resch, patient stated she will call back to resch apt later

## 2012-01-30 ENCOUNTER — Ambulatory Visit: Payer: BC Managed Care – PPO | Admitting: Hematology & Oncology

## 2012-01-30 ENCOUNTER — Other Ambulatory Visit: Payer: BC Managed Care – PPO | Admitting: Lab

## 2012-02-02 ENCOUNTER — Ambulatory Visit (INDEPENDENT_AMBULATORY_CARE_PROVIDER_SITE_OTHER): Payer: BC Managed Care – PPO | Admitting: Family Medicine

## 2012-02-02 VITALS — BP 130/83 | HR 71

## 2012-02-02 DIAGNOSIS — D6861 Antiphospholipid syndrome: Secondary | ICD-10-CM

## 2012-02-02 DIAGNOSIS — D6859 Other primary thrombophilia: Secondary | ICD-10-CM

## 2012-02-02 DIAGNOSIS — I82409 Acute embolism and thrombosis of unspecified deep veins of unspecified lower extremity: Secondary | ICD-10-CM

## 2012-02-02 LAB — POCT INR: INR: 2.1

## 2012-02-03 ENCOUNTER — Encounter: Payer: Self-pay | Admitting: Family Medicine

## 2012-02-03 DIAGNOSIS — N051 Unspecified nephritic syndrome with focal and segmental glomerular lesions: Secondary | ICD-10-CM | POA: Insufficient documentation

## 2012-02-23 ENCOUNTER — Other Ambulatory Visit: Payer: Self-pay | Admitting: Family Medicine

## 2012-03-11 LAB — CBC AND DIFFERENTIAL: Hemoglobin: 8 g/dL — AB (ref 12.0–16.0)

## 2012-03-11 LAB — BASIC METABOLIC PANEL: BUN: 28 mg/dL — AB (ref 4–21)

## 2012-03-15 LAB — BASIC METABOLIC PANEL: Potassium: 3.2 mmol/L — AB (ref 3.4–5.3)

## 2012-03-15 LAB — CBC AND DIFFERENTIAL: Hemoglobin: 8.3 g/dL — AB (ref 12.0–16.0)

## 2012-03-18 ENCOUNTER — Ambulatory Visit (INDEPENDENT_AMBULATORY_CARE_PROVIDER_SITE_OTHER): Payer: BC Managed Care – PPO | Admitting: Family Medicine

## 2012-03-18 VITALS — BP 157/85 | HR 66

## 2012-03-18 DIAGNOSIS — D6861 Antiphospholipid syndrome: Secondary | ICD-10-CM

## 2012-03-18 DIAGNOSIS — I82409 Acute embolism and thrombosis of unspecified deep veins of unspecified lower extremity: Secondary | ICD-10-CM

## 2012-03-18 LAB — POCT INR: INR: 2.8

## 2012-03-22 ENCOUNTER — Encounter: Payer: Self-pay | Admitting: *Deleted

## 2012-03-26 ENCOUNTER — Encounter: Payer: Self-pay | Admitting: *Deleted

## 2012-04-01 ENCOUNTER — Other Ambulatory Visit: Payer: Self-pay | Admitting: Family Medicine

## 2012-04-01 NOTE — Telephone Encounter (Signed)
Is this ok to fill. Looking in chart looks like discontinued and last got it in 08/2011. Clemetine Marker, LPN

## 2012-04-09 ENCOUNTER — Other Ambulatory Visit: Payer: Self-pay | Admitting: Family Medicine

## 2012-05-14 ENCOUNTER — Ambulatory Visit: Payer: BC Managed Care – PPO | Admitting: Family Medicine

## 2012-05-14 ENCOUNTER — Telehealth: Payer: Self-pay | Admitting: *Deleted

## 2012-05-14 ENCOUNTER — Ambulatory Visit (INDEPENDENT_AMBULATORY_CARE_PROVIDER_SITE_OTHER): Payer: Medicare Other | Admitting: Family Medicine

## 2012-05-14 VITALS — BP 144/85 | HR 70 | Wt 155.0 lb

## 2012-05-14 DIAGNOSIS — I82409 Acute embolism and thrombosis of unspecified deep veins of unspecified lower extremity: Secondary | ICD-10-CM | POA: Diagnosis not present

## 2012-05-14 DIAGNOSIS — D6861 Antiphospholipid syndrome: Secondary | ICD-10-CM

## 2012-05-14 LAB — POCT INR: INR: 2.7

## 2012-05-14 NOTE — Progress Notes (Signed)
No bruising or bleeding, no missed doses.Joy Anderson

## 2012-05-14 NOTE — Telephone Encounter (Signed)
Called pt and gave the following information; Same dose. 1 tab on Tuesday and Thursday and Saturday. 1.5 tabs all other days. Repeat in 4 weeks. Joy Anderson, Lahoma Crocker

## 2012-05-19 ENCOUNTER — Other Ambulatory Visit: Payer: Self-pay | Admitting: *Deleted

## 2012-05-19 ENCOUNTER — Other Ambulatory Visit: Payer: Self-pay | Admitting: Family Medicine

## 2012-05-19 DIAGNOSIS — M329 Systemic lupus erythematosus, unspecified: Secondary | ICD-10-CM | POA: Diagnosis not present

## 2012-05-19 DIAGNOSIS — N184 Chronic kidney disease, stage 4 (severe): Secondary | ICD-10-CM | POA: Diagnosis not present

## 2012-05-19 DIAGNOSIS — N039 Chronic nephritic syndrome with unspecified morphologic changes: Secondary | ICD-10-CM | POA: Diagnosis not present

## 2012-05-19 DIAGNOSIS — E749 Disorder of carbohydrate metabolism, unspecified: Secondary | ICD-10-CM | POA: Diagnosis not present

## 2012-05-19 DIAGNOSIS — I10 Essential (primary) hypertension: Secondary | ICD-10-CM | POA: Diagnosis not present

## 2012-05-19 MED ORDER — AMLODIPINE BESYLATE 5 MG PO TABS: ORAL_TABLET | ORAL | Status: DC

## 2012-05-21 ENCOUNTER — Encounter: Payer: Self-pay | Admitting: Family Medicine

## 2012-05-25 ENCOUNTER — Ambulatory Visit (INDEPENDENT_AMBULATORY_CARE_PROVIDER_SITE_OTHER): Payer: Medicare Other

## 2012-05-25 ENCOUNTER — Ambulatory Visit (INDEPENDENT_AMBULATORY_CARE_PROVIDER_SITE_OTHER): Payer: Medicare Other | Admitting: Sports Medicine

## 2012-05-25 VITALS — BP 176/85 | HR 66 | Wt 155.0 lb

## 2012-05-25 DIAGNOSIS — M25552 Pain in left hip: Secondary | ICD-10-CM | POA: Insufficient documentation

## 2012-05-25 DIAGNOSIS — M25559 Pain in unspecified hip: Secondary | ICD-10-CM

## 2012-05-25 MED ORDER — TRAMADOL HCL 50 MG PO TABS: 50.0000 mg | ORAL_TABLET | Freq: Three times a day (TID) | ORAL | Status: DC | PRN

## 2012-05-25 NOTE — Assessment & Plan Note (Signed)
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 Cannot use NSAIDs due to her Coumadin. Tramadol, physical therapy, x-rays. Return in 4 weeks, femorocetabular joint injection if no better. We do not need to stop Coumadin before this procedure.

## 2012-05-25 NOTE — Progress Notes (Signed)
  Subjective:    CC: Left groin pain  HPI: This is a very pleasant 62 year old female with a history of antiphospholipid antibody syndrome with a history of a DVT in her left leg approximately 3 years ago, with since then asymptomatic on Coumadin. Unfortunately for the past few weeks she developed pain she localizes in her left groin radiating to the anterior thigh but not past the knee. It is not positional, worse with ambulating, and is moderate to severe to the point where she is limping. She tried some Tylenol but this has been ineffective so far. Pain is worsening.  Past medical history, Surgical history, Family history not pertinant except as noted below, Social history, Allergies, and medications have been entered into the medical record, reviewed, and no changes needed.   Review of Systems: No fevers, chills, night sweats, weight loss, chest pain, or shortness of breath.   Objective:    General: Well Developed, well nourished, and in no acute distress.  Neuro: Alert and oriented x3, extra-ocular muscles intact, sensation grossly intact.  HEENT: Normocephalic, atraumatic, pupils equal round reactive to light, neck supple, no masses, no lymphadenopathy, thyroid nonpalpable.  Skin: Warm and dry, no rashes. Cardiac: Regular rate and rhythm, no murmurs rubs or gallops, no lower extremity edema.  Respiratory: Clear to auscultation bilaterally. Not using accessory muscles, speaking in full sentences. Left Hip: ROM IR: 40 Deg and exquisitely painful, ER: 45 Deg, Flexion: 120 Deg, Extension: 100 Deg, Abduction: 45 Deg, Adduction: 45 Deg Strength IR: 5/5, ER: 5/5, Flexion: 5/5, Extension: 5/5, Abduction: 5/5, Adduction: 5/5 Pelvic alignment unremarkable to inspection and palpation. Standing hip rotation and gait without trendelenburg sign / unsteadiness. Greater trochanter without tenderness to palpation. No tenderness over piriformis and greater trochanter. No pain with FABER or FADIR. No  SI joint tenderness and normal minimal SI movement.  Impression and Recommendations:

## 2012-05-26 ENCOUNTER — Ambulatory Visit: Payer: Medicare Other | Admitting: Family Medicine

## 2012-06-01 ENCOUNTER — Encounter: Payer: Self-pay | Admitting: Family Medicine

## 2012-06-07 ENCOUNTER — Ambulatory Visit: Payer: Medicare Other | Admitting: Family Medicine

## 2012-06-09 ENCOUNTER — Ambulatory Visit: Payer: Medicare Other | Attending: Sports Medicine | Admitting: Physical Therapy

## 2012-06-09 DIAGNOSIS — M6281 Muscle weakness (generalized): Secondary | ICD-10-CM | POA: Diagnosis not present

## 2012-06-09 DIAGNOSIS — R269 Unspecified abnormalities of gait and mobility: Secondary | ICD-10-CM | POA: Diagnosis not present

## 2012-06-09 DIAGNOSIS — M25559 Pain in unspecified hip: Secondary | ICD-10-CM | POA: Diagnosis not present

## 2012-06-09 DIAGNOSIS — IMO0001 Reserved for inherently not codable concepts without codable children: Secondary | ICD-10-CM | POA: Insufficient documentation

## 2012-06-15 ENCOUNTER — Ambulatory Visit: Payer: Medicare Other | Admitting: Physical Therapy

## 2012-06-15 DIAGNOSIS — R269 Unspecified abnormalities of gait and mobility: Secondary | ICD-10-CM | POA: Diagnosis not present

## 2012-06-15 DIAGNOSIS — M25559 Pain in unspecified hip: Secondary | ICD-10-CM | POA: Diagnosis not present

## 2012-06-15 DIAGNOSIS — IMO0001 Reserved for inherently not codable concepts without codable children: Secondary | ICD-10-CM | POA: Diagnosis not present

## 2012-06-15 DIAGNOSIS — M6281 Muscle weakness (generalized): Secondary | ICD-10-CM | POA: Diagnosis not present

## 2012-06-17 ENCOUNTER — Ambulatory Visit: Payer: Medicare Other | Admitting: Physical Therapy

## 2012-06-17 DIAGNOSIS — M25559 Pain in unspecified hip: Secondary | ICD-10-CM | POA: Diagnosis not present

## 2012-06-17 DIAGNOSIS — IMO0001 Reserved for inherently not codable concepts without codable children: Secondary | ICD-10-CM | POA: Diagnosis not present

## 2012-06-17 DIAGNOSIS — R269 Unspecified abnormalities of gait and mobility: Secondary | ICD-10-CM | POA: Diagnosis not present

## 2012-06-17 DIAGNOSIS — M6281 Muscle weakness (generalized): Secondary | ICD-10-CM | POA: Diagnosis not present

## 2012-06-21 ENCOUNTER — Ambulatory Visit: Payer: Medicare Other | Admitting: Physical Therapy

## 2012-06-21 DIAGNOSIS — M6281 Muscle weakness (generalized): Secondary | ICD-10-CM | POA: Diagnosis not present

## 2012-06-21 DIAGNOSIS — IMO0001 Reserved for inherently not codable concepts without codable children: Secondary | ICD-10-CM | POA: Diagnosis not present

## 2012-06-21 DIAGNOSIS — M25559 Pain in unspecified hip: Secondary | ICD-10-CM | POA: Diagnosis not present

## 2012-06-21 DIAGNOSIS — R269 Unspecified abnormalities of gait and mobility: Secondary | ICD-10-CM | POA: Diagnosis not present

## 2012-06-22 ENCOUNTER — Encounter: Payer: Self-pay | Admitting: Sports Medicine

## 2012-06-22 ENCOUNTER — Ambulatory Visit: Payer: Self-pay | Admitting: Sports Medicine

## 2012-06-22 ENCOUNTER — Ambulatory Visit (INDEPENDENT_AMBULATORY_CARE_PROVIDER_SITE_OTHER): Payer: Medicare Other | Admitting: Sports Medicine

## 2012-06-22 VITALS — BP 130/78 | HR 71 | Wt 154.0 lb

## 2012-06-22 DIAGNOSIS — M25559 Pain in unspecified hip: Secondary | ICD-10-CM | POA: Diagnosis not present

## 2012-06-22 DIAGNOSIS — D6859 Other primary thrombophilia: Secondary | ICD-10-CM | POA: Diagnosis not present

## 2012-06-22 DIAGNOSIS — I82409 Acute embolism and thrombosis of unspecified deep veins of unspecified lower extremity: Secondary | ICD-10-CM

## 2012-06-22 DIAGNOSIS — D6861 Antiphospholipid syndrome: Secondary | ICD-10-CM

## 2012-06-22 DIAGNOSIS — M25552 Pain in left hip: Secondary | ICD-10-CM

## 2012-06-22 LAB — POCT INR: INR: 3.7

## 2012-06-22 NOTE — Assessment & Plan Note (Signed)
She is a little bit supratherapeutic. No signs of bleeding. Decreasing to 7.5 mg just twice a week, and 5 mg the rest of the days. Return in 2 weeks to recheck.

## 2012-06-22 NOTE — Progress Notes (Signed)
  Subjective:    CC: Follow up  HPI: Hip osteoarthritis: left-sided, resolved with physical therapy.  Anticoagulation: INR is somewhat elevated today, no bleeding.  Past medical history, Surgical history, Family history not pertinant except as noted below, Social history, Allergies, and medications have been entered into the medical record, reviewed, and no changes needed.   Review of Systems: No fevers, chills, night sweats, weight loss, chest pain, or shortness of breath.   Objective:    General: Well Developed, well nourished, and in no acute distress.  Neuro: Alert and oriented x3, extra-ocular muscles intact, sensation grossly intact.  HEENT: Normocephalic, atraumatic, pupils equal round reactive to light, neck supple, no masses, no lymphadenopathy, thyroid nonpalpable.  Skin: Warm and dry, no rashes. Cardiac: Regular rate and rhythm, no murmurs rubs or gallops, no lower extremity edema.  Respiratory: Clear to auscultation bilaterally. Not using accessory muscles, speaking in full sentences. Left Hip: ROM IR: 45 Deg, ER: 45 Deg, Flexion: 120 Deg, Extension: 100 Deg, Abduction: 45 Deg, Adduction: 45 Deg Strength IR: 5/5, ER: 5/5, Flexion: 5/5, Extension: 5/5, Abduction: 5/5, Adduction: 5/5 Pelvic alignment unremarkable to inspection and palpation. Standing hip rotation and gait without trendelenburg sign / unsteadiness. Greater trochanter without tenderness to palpation. No tenderness over piriformis and greater trochanter. No pain with FABER or FADIR. No SI joint tenderness and normal minimal SI movement.  Impression and Recommendations:

## 2012-06-22 NOTE — Assessment & Plan Note (Signed)
Left hip osteoarthritis is most likely. Pain is actually mostly resolved, and she does not desire interventional treatment at this time. Pain continues to improve, and she will continue her physical therapy, and touch base with me on an as-needed basis.

## 2012-06-23 ENCOUNTER — Ambulatory Visit: Payer: Medicare Other | Admitting: Physical Therapy

## 2012-06-23 DIAGNOSIS — M25559 Pain in unspecified hip: Secondary | ICD-10-CM | POA: Diagnosis not present

## 2012-06-23 DIAGNOSIS — R269 Unspecified abnormalities of gait and mobility: Secondary | ICD-10-CM | POA: Diagnosis not present

## 2012-06-23 DIAGNOSIS — IMO0001 Reserved for inherently not codable concepts without codable children: Secondary | ICD-10-CM | POA: Diagnosis not present

## 2012-06-23 DIAGNOSIS — M6281 Muscle weakness (generalized): Secondary | ICD-10-CM | POA: Diagnosis not present

## 2012-06-29 ENCOUNTER — Ambulatory Visit: Payer: Medicare Other | Admitting: Physical Therapy

## 2012-06-29 DIAGNOSIS — M6281 Muscle weakness (generalized): Secondary | ICD-10-CM | POA: Diagnosis not present

## 2012-06-29 DIAGNOSIS — IMO0001 Reserved for inherently not codable concepts without codable children: Secondary | ICD-10-CM | POA: Diagnosis not present

## 2012-06-29 DIAGNOSIS — M25559 Pain in unspecified hip: Secondary | ICD-10-CM | POA: Diagnosis not present

## 2012-06-29 DIAGNOSIS — R269 Unspecified abnormalities of gait and mobility: Secondary | ICD-10-CM | POA: Diagnosis not present

## 2012-07-01 ENCOUNTER — Ambulatory Visit: Payer: Medicare Other | Admitting: Physical Therapy

## 2012-07-01 DIAGNOSIS — R269 Unspecified abnormalities of gait and mobility: Secondary | ICD-10-CM | POA: Diagnosis not present

## 2012-07-01 DIAGNOSIS — IMO0001 Reserved for inherently not codable concepts without codable children: Secondary | ICD-10-CM | POA: Diagnosis not present

## 2012-07-01 DIAGNOSIS — M25559 Pain in unspecified hip: Secondary | ICD-10-CM | POA: Diagnosis not present

## 2012-07-01 DIAGNOSIS — M6281 Muscle weakness (generalized): Secondary | ICD-10-CM | POA: Diagnosis not present

## 2012-07-06 ENCOUNTER — Ambulatory Visit: Payer: Medicare Other | Attending: Sports Medicine | Admitting: Physical Therapy

## 2012-07-06 DIAGNOSIS — R269 Unspecified abnormalities of gait and mobility: Secondary | ICD-10-CM | POA: Insufficient documentation

## 2012-07-06 DIAGNOSIS — IMO0001 Reserved for inherently not codable concepts without codable children: Secondary | ICD-10-CM | POA: Diagnosis not present

## 2012-07-06 DIAGNOSIS — M25559 Pain in unspecified hip: Secondary | ICD-10-CM | POA: Insufficient documentation

## 2012-07-06 DIAGNOSIS — M6281 Muscle weakness (generalized): Secondary | ICD-10-CM | POA: Diagnosis not present

## 2012-07-08 ENCOUNTER — Ambulatory Visit: Payer: Medicare Other | Admitting: Physical Therapy

## 2012-07-13 ENCOUNTER — Ambulatory Visit: Payer: Medicare Other | Admitting: Physical Therapy

## 2012-07-15 ENCOUNTER — Other Ambulatory Visit: Payer: Self-pay

## 2012-07-15 ENCOUNTER — Ambulatory Visit: Payer: Medicare Other | Admitting: Physical Therapy

## 2012-08-03 DIAGNOSIS — N184 Chronic kidney disease, stage 4 (severe): Secondary | ICD-10-CM | POA: Diagnosis not present

## 2012-08-03 DIAGNOSIS — N039 Chronic nephritic syndrome with unspecified morphologic changes: Secondary | ICD-10-CM | POA: Diagnosis not present

## 2012-08-18 ENCOUNTER — Ambulatory Visit (INDEPENDENT_AMBULATORY_CARE_PROVIDER_SITE_OTHER): Payer: Medicare Other | Admitting: Family Medicine

## 2012-08-18 ENCOUNTER — Encounter: Payer: Self-pay | Admitting: *Deleted

## 2012-08-18 VITALS — BP 140/91 | HR 76

## 2012-08-18 DIAGNOSIS — D6859 Other primary thrombophilia: Secondary | ICD-10-CM | POA: Diagnosis not present

## 2012-08-18 DIAGNOSIS — I82409 Acute embolism and thrombosis of unspecified deep veins of unspecified lower extremity: Secondary | ICD-10-CM | POA: Diagnosis not present

## 2012-08-18 DIAGNOSIS — D6861 Antiphospholipid syndrome: Secondary | ICD-10-CM

## 2012-08-18 LAB — POCT INR: INR: 2.9

## 2012-08-18 NOTE — Progress Notes (Signed)
  Subjective:    Patient ID: Joy Anderson, female    DOB: 02-08-1950, 62 y.o.   MRN: [id number] Pt notified of new dose & to recheck in 3 weeks.  Beatris Ship, CMA HPI    Review of Systems     Objective:   Physical Exam        Assessment & Plan:

## 2012-09-03 DIAGNOSIS — H4010X0 Unspecified open-angle glaucoma, stage unspecified: Secondary | ICD-10-CM | POA: Diagnosis not present

## 2012-09-03 DIAGNOSIS — E119 Type 2 diabetes mellitus without complications: Secondary | ICD-10-CM | POA: Diagnosis not present

## 2012-09-03 DIAGNOSIS — H409 Unspecified glaucoma: Secondary | ICD-10-CM | POA: Diagnosis not present

## 2012-10-04 ENCOUNTER — Other Ambulatory Visit: Payer: Self-pay | Admitting: Family Medicine

## 2012-10-14 ENCOUNTER — Ambulatory Visit (INDEPENDENT_AMBULATORY_CARE_PROVIDER_SITE_OTHER): Payer: Medicare Other | Admitting: Family Medicine

## 2012-10-14 ENCOUNTER — Other Ambulatory Visit (HOSPITAL_COMMUNITY)
Admission: RE | Admit: 2012-10-14 | Discharge: 2012-10-14 | Disposition: A | Discharge status: Home / Self Care | Payer: Medicare Other | Source: Ambulatory Visit | Attending: Family Medicine | Admitting: Family Medicine

## 2012-10-14 ENCOUNTER — Ambulatory Visit: Payer: Self-pay | Admitting: Family Medicine

## 2012-10-14 ENCOUNTER — Encounter: Payer: Self-pay | Admitting: Family Medicine

## 2012-10-14 ENCOUNTER — Other Ambulatory Visit: Payer: Self-pay | Admitting: Family Medicine

## 2012-10-14 VITALS — BP 177/91 | HR 74 | Wt 158.0 lb

## 2012-10-14 DIAGNOSIS — I82409 Acute embolism and thrombosis of unspecified deep veins of unspecified lower extremity: Secondary | ICD-10-CM

## 2012-10-14 DIAGNOSIS — I10 Essential (primary) hypertension: Secondary | ICD-10-CM

## 2012-10-14 DIAGNOSIS — Z1151 Encounter for screening for human papillomavirus (HPV): Secondary | ICD-10-CM | POA: Diagnosis not present

## 2012-10-14 DIAGNOSIS — Z01419 Encounter for gynecological examination (general) (routine) without abnormal findings: Secondary | ICD-10-CM | POA: Diagnosis not present

## 2012-10-14 DIAGNOSIS — Z23 Encounter for immunization: Secondary | ICD-10-CM | POA: Diagnosis not present

## 2012-10-14 DIAGNOSIS — R87810 Cervical high risk human papillomavirus (HPV) DNA test positive: Secondary | ICD-10-CM | POA: Insufficient documentation

## 2012-10-14 DIAGNOSIS — Z0000 Encounter for general adult medical examination without abnormal findings: Secondary | ICD-10-CM

## 2012-10-14 DIAGNOSIS — N051 Unspecified nephritic syndrome with focal and segmental glomerular lesions: Secondary | ICD-10-CM

## 2012-10-14 DIAGNOSIS — Z1231 Encounter for screening mammogram for malignant neoplasm of breast: Secondary | ICD-10-CM

## 2012-10-14 DIAGNOSIS — D6861 Antiphospholipid syndrome: Secondary | ICD-10-CM

## 2012-10-14 MED ORDER — WARFARIN SODIUM 5 MG PO TABS: ORAL_TABLET | ORAL | Status: DC

## 2012-10-14 NOTE — Progress Notes (Signed)
Subjective:    Joy Anderson is a 62 y.o. female who presents for Medicare Annual/Subsequent preventive examination.  Preventive Screening-Counseling & Management  Tobacco History  Smoking status  . Never Smoker   Smokeless tobacco  . Not on file     Problems Prior to Visit 1. 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  on Plaquenil for her lupus. She reports that her eye exam is up-to-date 3. diabetes-last exam was a most year ago..  Current Problems (verified) Patient Active Problem List   Diagnosis Date Noted  . Pain in joint of left hip 05/25/2012  . 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 Antiphospholipid antibody with hypercoagulable state 08/27/2010  . 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 Gout 04/17/2010  . PROPTOSIS 12/12/2009  . ANEMIA, IRON DEFICIENCY 11/20/2009  . FATTY LIVER DISEASE 10/26/2009  . DIABETES MELLITUS, TYPE II 09/20/2009  . DVT 09/20/2009  . PULMONARY HYPERTENSION, MILD 04/14/2008  . PALPITATIONS 03/22/2008  . RAYNAUD'S DISEASE 02/01/2008  . SLE 12/06/2007  . ESSENTIAL HYPERTENSION, BENIGN 11/30/2007  . POLYARTHRITIS 11/30/2007    Medications Prior to Visit Current Outpatient Prescriptions on File Prior to Visit  Medication Sig Dispense Refill  . amLODipine (NORVASC) 5 MG tablet TAKE 1 TABLET BY MOUTH EVERY DAY  90 tablet  1  . amLODipine (NORVASC) 5 MG tablet TAKE 1 TABLET BY MOUTH EVERY DAY  30 tablet  0  . atenolol (TENORMIN) 100 MG tablet TAKE ONE TABLET BY MOUTH ONCE DAILY  60 tablet  0  . benzonatate (TESSALON) 200 MG capsule Take 1 capsule (200 mg total) by mouth 3 (three) times daily as needed for cough.  20 capsule  1  . corticotropin (ACTHAR HP) 80 UNIT/ML injectable gel Inject 80 Units into the skin daily.      . furosemide (LASIX) 20 MG tablet Take 20 mg by mouth daily.       . hydrocortisone 2.5 % cream Apply 1  application topically as needed.       . hydroxychloroquine (PLAQUENIL) 200 MG tablet Take 200 mg by mouth 2 (two) times daily.        Marland Kitchen KLOR-CON M20 20 MEQ tablet Take 20 mEq by mouth daily.       Marland Kitchen losartan (COZAAR) 100 MG tablet Take 1 tablet (100 mg total) by mouth daily.  30 tablet  6  . omega-3 acid ethyl esters (LOVAZA) 1 G capsule Take 2 capsules (2 g total) by mouth 2 (two) times daily.  120 capsule  3  . traMADol (ULTRAM) 50 MG tablet Take 1 tablet (50 mg total) by mouth every 8 (eight) hours as needed for pain.  50 tablet  2  . Vitamin D, Ergocalciferol, (DRISDOL) 50000 UNITS CAPS 50,000 Units every 7 (seven) days.       Marland Kitchen warfarin (COUMADIN) 5 MG tablet TAKE 1 TABLET (5 MG TOTAL) BY MOUTH DAILY.  80 tablet  3   No current facility-administered medications on file prior to visit.    Current Medications (verified) Current Outpatient Prescriptions  Medication Sig Dispense Refill  . amLODipine (NORVASC) 5 MG tablet TAKE 1 TABLET BY MOUTH EVERY DAY  90 tablet  1  . amLODipine (NORVASC) 5 MG tablet TAKE 1 TABLET BY MOUTH EVERY DAY  30 tablet  0  . atenolol (TENORMIN) 100 MG tablet TAKE ONE TABLET BY MOUTH ONCE DAILY  60 tablet  0  . benzonatate (TESSALON) 200 MG  capsule Take 1 capsule (200 mg total) by mouth 3 (three) times daily as needed for cough.  20 capsule  1  . corticotropin (ACTHAR HP) 80 UNIT/ML injectable gel Inject 80 Units into the skin daily.      . furosemide (LASIX) 20 MG tablet Take 20 mg by mouth daily.       . hydrocortisone 2.5 % cream Apply 1 application topically as needed.       . hydroxychloroquine (PLAQUENIL) 200 MG tablet Take 200 mg by mouth 2 (two) times daily.        Marland Kitchen KLOR-CON M20 20 MEQ tablet Take 20 mEq by mouth daily.       Marland Kitchen losartan (COZAAR) 100 MG tablet Take 1 tablet (100 mg total) by mouth daily.  30 tablet  6  . omega-3 acid ethyl esters (LOVAZA) 1 G capsule Take 2 capsules (2 g total) by mouth 2 (two) times daily.  120 capsule  3  . traMADol  (ULTRAM) 50 MG tablet Take 1 tablet (50 mg total) by mouth every 8 (eight) hours as needed for pain.  50 tablet  2  . Vitamin D, Ergocalciferol, (DRISDOL) 50000 UNITS CAPS 50,000 Units every 7 (seven) days.       Marland Kitchen warfarin (COUMADIN) 5 MG tablet TAKE 1 TABLET (5 MG TOTAL) BY MOUTH DAILY.  80 tablet  3   No current facility-administered medications for this visit.     Allergies (verified) Ace inhibitors; Amlodipine; Fish oil; and Metformin and related   PAST HISTORY  Family History Family History  Problem Relation Age of Onset  . Alcohol abuse Father   . Cancer Brother     colon  . Cancer Brother     prostate  . Cerebral palsy Brother   . Diabetes Other     Social History History  Substance Use Topics  . Smoking status: Never Smoker   . Smokeless tobacco: Not on file  . Alcohol Use: Yes     Comment: occasional     Are there smokers in your home (other than you)? No  Risk Factors Current exercise habits: none  Dietary issues discussed: no special diet   Cardiac risk factors: diabetes mellitus, obesity (BMI >= 30 kg/m2) and sedentary lifestyle.  Depression Screen (Note: if answer to either of the following is "Yes", a more complete depression screening is indicated)   Over the past two weeks, have you felt down, depressed or hopeless? No  Over the past two weeks, have you felt little interest or pleasure in doing things? No  Have you lost interest or pleasure in daily life? No  Do you often feel hopeless? No  Do you cry easily over simple problems? No  Activities of Daily Living In your present state of health, do you have any difficulty performing the following activities?:  Driving? No Managing money?  No Feeding yourself? No Getting from bed to chair? No  Climbing a flight of stairs? No Preparing food and eating?: No Bathing or showering? No Getting dressed: No Getting to the toilet? No Using the toilet:No Moving around from place to place: No In the  past year have you fallen or had a near fall?:No   Are you sexually active?  No  Do you have more than one partner?  No  Hearing Difficulties: No Do you often ask people to speak up or repeat themselves? No Do you experience ringing or noises in your ears? No Do you have difficulty understanding soft or  whispered voices? No   Do you feel that you have a problem with memory? Yes  Do you often misplace items? Yes  Do you feel safe at home?  Yes  Cognitive Testing  Alert? Yes  Normal Appearance?Yes  Oriented to person? Yes  Place? Yes   Time? Yes  Recall of three objects?  Yes  Can perform simple calculations? Yes  Displays appropriate judgment?Yes  Can read the correct time from a watch face?Yes   Advanced Directives have been discussed with the patient? Yes  List the Names of Other Physician/Practitioners you currently use: 1.  Dr. Kathlen Mody (eye) 2. Dr. Nicole Kindred (kidney) 3.  Dr. Franki Monte (rheum)  Indicate any recent Medical Services you may have received from other than [facility] providers in the past year (date may be approximate).  Immunization History  Administered Date(s) Administered  . Influenza Whole 12/12/2009  . Pneumococcal Polysaccharide 12/12/2009  . Tdap 09/06/2004    Screening Tests Health Maintenance  Topic Date Due  . Mammogram  12/21/2011  . Pap Smear  01/07/2012  . Hemoglobin A1c  03/21/2012  . Foot Exam  06/08/2012  . Urine Microalbumin  06/08/2012  . Influenza Vaccine  08/06/2012  . Ophthalmology Exam  09/03/2013  . Tetanus/tdap  09/07/2014  . Pneumococcal Polysaccharide Vaccine (##2) 12/13/2014  . Colonoscopy  04/15/2018  . Zostavax  Addressed    All answers were reviewed with the patient and necessary referrals were made:  METHENEY,CATHERINE, MD   10/14/2012   History reviewed: allergies, current medications, past family history, past medical history, past social history, past surgical history and problem list  Review of Systems A comprehensive  review of systems was negative.    Objective:     Vision by Snellen chart: Had eye exam done a few months ago with Dr. Haynes Bast.   There is no weight on file to calculate BMI. LMP 03/18/1999  BP 177/91  Pulse 74  Wt 158 lb (71.668 kg)  BMI 28.89 kg/m2  LMP 03/18/1999  General Appearance:    Alert, cooperative, no distress, appears stated age  Head:    Normocephalic, without obvious abnormality, atraumatic  Eyes:    PERRL, conjunctiva/corneas clear, EOM's intact, both eyes  Ears:    Normal TM's and external ear canals, both ears  Nose:   Nares normal, septum midline, mucosa normal, no drainage    or sinus tenderness  Throat:   Lips, mucosa, and tongue normal; teeth and gums normal  Neck:   Supple, symmetrical, trachea midline, no adenopathy;    thyroid:  no enlargement/tenderness/nodules; no carotid   bruit or JVD  Back:     Symmetric, no curvature, ROM normal, no CVA tenderness  Lungs:     Clear to auscultation bilaterally, respirations unlabored  Chest Wall:    No deformity   Heart:    Regular rate and rhythm, S1 and S2 normal, no murmur, rub   or gallop  Breast Exam:    No tenderness, masses, or nipple abnormality  Abdomen:     Soft, non-tender, bowel sounds active all four quadrants,    no masses, no organomegaly  Genitalia:    Not pefromed.   Rectal:    Not pefromed.   Extremities:   Extremities normal, atraumatic, no cyanosis or edema  Pulses:   2+ and symmetric all extremities  Skin:   Skin color, texture, turgor normal, no rashes or lesions  Lymph nodes:   Cervical, supraclavicular, and axillary nodes normal  Neurologic:   CNII-XII  intact, normal strength, sensation and reflexes    throughout       Assessment:     Medicare Wellness Exam      Plan:     During the course of the visit the patient was educated and counseled about appropriate screening and preventive services including:    Influenza vaccine  Discussed living will  Order mammo  F/U  in 2 weeks for diabetic check  Depresson screen performed  Diet review for nutrition referral? Yes ____  Not Indicated __x_   Patient Instructions (the written plan) was given to the patient.  Medicare Attestation I have personally reviewed: The patient's medical and social history Their use of alcohol, tobacco or illicit drugs Their current medications and supplements The patient's functional ability including ADLs,fall risks, home safety risks, cognitive, and hearing and visual impairment Diet and physical activities Evidence for depression or mood disorders  The patient's weight, height, BMI, and visual acuity have been recorded in the chart.  I have made referrals, counseling, and provided education to the patient based on review of the above and I have provided the patient with a written personalized care plan for preventive services.     METHENEY,CATHERINE, MD   10/14/2012

## 2012-10-15 ENCOUNTER — Telehealth: Payer: Self-pay | Admitting: *Deleted

## 2012-10-15 NOTE — Telephone Encounter (Signed)
5 mg every day except Monday and Friday, 7.5 mg on these days. Recheck in 4 weeks Pt informed.Audelia Hives [place]

## 2012-10-21 DIAGNOSIS — I10 Essential (primary) hypertension: Secondary | ICD-10-CM | POA: Diagnosis not present

## 2012-10-21 LAB — COMPLETE METABOLIC PANEL WITH GFR
CO2: 19 mEq/L (ref 19–32)
Calcium: 8.7 mg/dL (ref 8.4–10.5)
Chloride: 111 mEq/L (ref 96–112)
Creat: 2.06 mg/dL — ABNORMAL HIGH (ref 0.50–1.10)
GFR, Est African American: 29 mL/min — ABNORMAL LOW
GFR, Est Non African American: 25 mL/min — ABNORMAL LOW
Glucose, Bld: 135 mg/dL — ABNORMAL HIGH (ref 70–99)
Sodium: 141 mEq/L (ref 135–145)
Total Bilirubin: 0.3 mg/dL (ref 0.3–1.2)
Total Protein: 6.4 g/dL (ref 6.0–8.3)

## 2012-10-21 LAB — LIPID PANEL
HDL: 43 mg/dL (ref >39)
Triglycerides: 359 mg/dL — ABNORMAL HIGH (ref <150)

## 2012-10-28 ENCOUNTER — Telehealth: Payer: Self-pay | Admitting: Family Medicine

## 2012-10-28 ENCOUNTER — Encounter: Payer: Self-pay | Admitting: Family Medicine

## 2012-10-28 ENCOUNTER — Ambulatory Visit (INDEPENDENT_AMBULATORY_CARE_PROVIDER_SITE_OTHER): Payer: Medicare Other | Admitting: Family Medicine

## 2012-10-28 VITALS — BP 174/88 | HR 77 | Wt 158.0 lb

## 2012-10-28 DIAGNOSIS — I10 Essential (primary) hypertension: Secondary | ICD-10-CM | POA: Diagnosis not present

## 2012-10-28 DIAGNOSIS — E119 Type 2 diabetes mellitus without complications: Secondary | ICD-10-CM | POA: Diagnosis not present

## 2012-10-28 NOTE — Telephone Encounter (Signed)
Call pt: Is she still taking losartan or not? Her BP is out of control the last 2 times here. If someone stopped it then let me know so we can find something else to put you on.

## 2012-10-28 NOTE — Progress Notes (Signed)
  Subjective:    Patient ID: Joy Anderson, female    DOB: 1950/09/13, 62 y.o.   MRN: [id number]  HPI DM - No hypoglycemic events.  N o wounds that aren't healing well.  Diet controlled. Not on medications.  Lab Results  Component Value Date   HGBA1C 5.7 09/22/2011   HTN- Pt denies chest pain, SOB, dizziness, or heart palpitations.  Taking meds as directed w/o problems.  Denies medication side effects.   Review of Systems     Objective:   Physical Exam  Constitutional: She is oriented to person, place, and time. She appears well-developed and well-nourished.  HENT:  Head: Normocephalic and atraumatic.  Cardiovascular: Normal rate, regular rhythm and normal heart sounds.   Pulmonary/Chest: Effort normal and breath sounds normal.  Neurological: She is alert and oriented to person, place, and time.  Skin: Skin is warm and dry.  Psychiatric: She has a normal mood and affect. Her behavior is normal.          Assessment & Plan:  DM- her A1c is up a little bit compared to last time. At 6.3 today. WEll controlled.  Her last one was approximately a year ago. Her foot exam was performed today. She does see Dr. Kathlen Mody for her regular eye exam.  F/U in 3-4 mo. Aslo discussed need for a statin based on new Diabetes guidelines. She wants to think about it.  Lab Results  Component Value Date   CHOL 248* 10/21/2012   HDL 43 10/21/2012   LDLCALC 133* 10/21/2012   LDLDIRECT 77 03/23/2008   TRIG 359* 10/21/2012   CHOLHDL 5.8 10/21/2012     HTN-Uncontrolled. Will confirm if really taking losartan or not. Not sure when this was stopped.  BP Readings from Last 3 Encounters:  10/28/12 174/88  10/14/12 177/91  08/18/12 140/91

## 2012-10-28 NOTE — Patient Instructions (Addendum)
Think about starting a cholesterol lowering medication ( statin) at bedtime because of your Diabetes. Talk with your nephroloigst about it.    Human Papillomavirus Human papillomavirus (HPV) is the most common sexually transmitted infection (STI) and is highly contagious. HPV infections cause genital warts and cancers to the outlet of the womb (cervix), birth canal (vagina), opening of the birth canal (vulva), and anus. There are over 100 types of HPV. Four types of HPV are responsible for causing 70% of all cervical cancers. Ninety percent of anal cancers and genital warts are caused by HPV. Unless you have wart-like lesions in the throat or genital warts that you can see or feel, HPV usually does not cause symptoms. Therefore, people can be infected for long periods and pass it on to others without knowing it. HPV in pregnancy usually does not cause a problem for the mother or baby. If the mother has genital warts, the baby rarely gets infected. When the HPV infection is found to be pre-cancerous on the cervix, vagina, or vulva, the mother will be followed closely during the pregnancy. Any needed treatment will be done after the baby is born. CAUSES   Having unprotected sex. HPV can be spread by oral, vaginal, or anal sexual activity.  Having several sex partners.  Having a sex partner who has other sex partners.  Having or having had another sexually transmitted infection. SYMPTOMS   More than 90% of people carrying HPV cannot tell anything is wrong.  Wart-like lesions in the throat (from having oral sex).  Warts in the infected skin or mucous membranes.  Genital warts may itch, burn, or bleed.  Genital warts may be painful or bleed during sexual intercourse. DIAGNOSIS   Genital warts are easily seen with the naked eye.  Currently, there is no FDA-approved test to detect HPV in males.  In females, a Pap test can show cells which are infected with HPV.  In females, a scope can  be used to view the cervix (colposcopy). A colposcopy can be performed if the pelvic exam or Pap test is abnormal.  In females, a sample of tissue may be removed (biopsy) during the colposcopy. TREATMENT   Treatment of genital warts can include:  Podophyllin lotion or gel.  Bichloroacetic acid (BCA) or trichloroacetic acid (TCA).  Podofilox solution or gel.  Imiquimod cream.  Interferon injections.  Use of a probe to apply extreme cold (cryotherapy).  Application of an intense beam of light (laser treatment).  Use of a probe to apply extreme heat (electrocautery).  Surgery.  HPV of the cervix, vagina, or vulva can be treated with:  Cryotherapy.  Laser treatment.  Electrocautery.  Surgery. Your caregiver will follow you closely after you are treated. This is because the HPV can come back and may need treatment again. HOME CARE INSTRUCTIONS   Follow your caregiver's instructions regarding medications, Pap tests, and follow-up exams.  Do not touch or scratch the warts.  Do not treat genital warts with medication used for treating hand warts.  Tell your sex partner about your infection because he or she may also need treatment.  Do not have sex while you are being treated.  After treatment, use condoms during sex to prevent future infections.  Have only 1 sex partner.  Have a sex partner who does not have other sex partners.  Use over-the-counter creams for itching or irritation as directed by your caregiver.  Use over-the-counter or prescription medicines for pain, discomfort, or fever as directed by  your caregiver.  Do not douche or use tampons during treatment of HPV. PREVENTION   Talk to your caregiver about getting the HPV vaccines. These vaccines prevent some HPV infections and cancers. It is recommended that the vaccine be given to males and females between the age of 3 and 59 years old. It will not work if you already have HPV and it is not recommended  for pregnant women. The vaccines are not recommended for pregnant women.  Call your caregiver if you think you are pregnant and have the HPV.  A PAP test is done to screen for cervical cancer.  The first PAP test should be done at age 12.  Between ages 23 and 22, PAP tests are repeated every 2 years.  Beginning at age 68, you are advised to have a PAP test every 3 years as long as your past 3 PAP tests have been normal.  Some women have medical problems that increase the chance of getting cervical cancer. Talk to your caregiver about these problems. It is especially important to talk to your caregiver if a new problem develops soon after your last PAP test. In these cases, your caregiver may recommend more frequent screening and Pap tests.  The above recommendations are the same for women who have or have not gotten the vaccine for HPV (Human Papillomavirus).  If you had a hysterectomy for a problem that was not a cancer or a condition that could lead to cancer, then you no longer need Pap tests. However, even if you no longer need a PAP test, a regular exam is a good idea to make sure no other problems are starting.   If you are between ages 2 and 2, and you have had normal Pap tests going back 10 years, you no longer need Pap tests. However, even if you no longer need a PAP test, a regular exam is a good idea to make sure no other problems are starting.  If you have had past treatment for cervical cancer or a condition that could lead to cancer, you need Pap tests and screening for cancer for at least 20 years after your treatment.  If Pap tests have been discontinued, risk factors (such as a new sexual partner)need to be re-assessed to determine if screening should be resumed.  Some women may need screenings more often if they are at high risk for cervical cancer. SEEK MEDICAL CARE IF:   The treated skin becomes red, swollen or painful.  You have an oral temperature above 102 F  (38.9 C).  You feel generally ill.  You feel lumps or pimple-like projections in and around your genital area.  You develop bleeding of the vagina or the treatment area.  You develop painful sexual intercourse. Document Released: 03/15/2003 Document Revised: 03/17/2011 Document Reviewed: 03/04/2007 [facility] Patient Information 2014 [place].

## 2012-10-29 MED ORDER — LOSARTAN POTASSIUM 100 MG PO TABS: 100.0000 mg | ORAL_TABLET | Freq: Every day | ORAL | Status: DC

## 2012-10-29 NOTE — Telephone Encounter (Signed)
Would she be okay with changing her atenolol to metoprolol. It  actually has better outcome data, such as reducing risk of heart attack and stroke. It is still in the same family as the atenolol so she will probably tolerate it well.

## 2012-10-29 NOTE — Telephone Encounter (Signed)
Pt is still taking .Joy Anderson [place]

## 2012-11-01 MED ORDER — METOPROLOL SUCCINATE ER 100 MG PO TB24: 100.0000 mg | ORAL_TABLET | Freq: Every day | ORAL | Status: DC

## 2012-11-01 NOTE — Telephone Encounter (Signed)
Pt is ok with this change

## 2012-11-01 NOTE — Telephone Encounter (Signed)
New rx sent

## 2012-11-02 ENCOUNTER — Ambulatory Visit (INDEPENDENT_AMBULATORY_CARE_PROVIDER_SITE_OTHER): Payer: Medicare Other

## 2012-11-02 DIAGNOSIS — Z1231 Encounter for screening mammogram for malignant neoplasm of breast: Secondary | ICD-10-CM | POA: Diagnosis not present

## 2012-11-02 NOTE — Telephone Encounter (Signed)
Pt aware.

## 2012-11-07 ENCOUNTER — Other Ambulatory Visit: Payer: Self-pay | Admitting: Family Medicine

## 2012-11-18 DIAGNOSIS — N039 Chronic nephritic syndrome with unspecified morphologic changes: Secondary | ICD-10-CM | POA: Diagnosis not present

## 2012-11-18 DIAGNOSIS — I10 Essential (primary) hypertension: Secondary | ICD-10-CM | POA: Diagnosis not present

## 2012-11-18 DIAGNOSIS — N184 Chronic kidney disease, stage 4 (severe): Secondary | ICD-10-CM | POA: Diagnosis not present

## 2012-11-18 DIAGNOSIS — M329 Systemic lupus erythematosus, unspecified: Secondary | ICD-10-CM | POA: Diagnosis not present

## 2012-11-26 ENCOUNTER — Telehealth: Payer: Self-pay

## 2012-11-26 MED ORDER — WARFARIN SODIUM 5 MG PO TABS: ORAL_TABLET | ORAL | Status: DC

## 2012-11-26 NOTE — Telephone Encounter (Signed)
I advised her to take the metoprolol instead of the atenolol.

## 2012-11-26 NOTE — Telephone Encounter (Signed)
Dr Nicole Kindred, her kidney doctor put her back on the atenolol in addition to the amlodipine. She needs a refill. Are you ok with her taking the atenolol?

## 2012-11-26 NOTE — Telephone Encounter (Signed)
Called Dr. Marguerite Olea nurse and discussed that I had put her on metoprolol instead of atenolol and that wehen she was hwere she wasn't sure she was really taking her losartan.  Dr. Marguerite Olea office will get back to me on the issue. Take teh metoprolol for now.

## 2012-11-30 ENCOUNTER — Telehealth: Payer: Self-pay | Admitting: Family Medicine

## 2012-11-30 NOTE — Telephone Encounter (Signed)
Please call patient: I did hear back from Dr. Les Pou office. He is okay with her taking the metoprolol instead of the atenolol. Again this will cover her blood pressure a little bit better over 24-hour period instead of the atenolol.

## 2012-11-30 NOTE — Telephone Encounter (Signed)
LMOM notifying pt of med change.

## 2012-12-21 ENCOUNTER — Ambulatory Visit (INDEPENDENT_AMBULATORY_CARE_PROVIDER_SITE_OTHER): Payer: Medicare Other | Admitting: Family Medicine

## 2012-12-21 ENCOUNTER — Encounter: Payer: Self-pay | Admitting: *Deleted

## 2012-12-21 VITALS — BP 157/90 | HR 87 | Wt 159.0 lb

## 2012-12-21 DIAGNOSIS — I82409 Acute embolism and thrombosis of unspecified deep veins of unspecified lower extremity: Secondary | ICD-10-CM

## 2012-12-21 DIAGNOSIS — D6859 Other primary thrombophilia: Secondary | ICD-10-CM

## 2012-12-21 DIAGNOSIS — D6861 Antiphospholipid syndrome: Secondary | ICD-10-CM

## 2012-12-21 NOTE — Progress Notes (Signed)
Patient was in office for coumadin check. INR was 4.1 it has been logged on the sheet in the lab. Patient denied anything abnormal . Labs was ordered and the patient was sent to the lab.

## 2012-12-21 NOTE — Progress Notes (Signed)
Patient has been notified to skip the dose for tonight and to take 5 mg everyday and to recheck in 1 week. Rhonda Cunningham,CMA

## 2012-12-22 ENCOUNTER — Telehealth: Payer: Self-pay | Admitting: *Deleted

## 2012-12-22 ENCOUNTER — Ambulatory Visit: Payer: Self-pay | Admitting: Family Medicine

## 2012-12-22 DIAGNOSIS — D6861 Antiphospholipid syndrome: Secondary | ICD-10-CM

## 2012-12-22 DIAGNOSIS — I82409 Acute embolism and thrombosis of unspecified deep veins of unspecified lower extremity: Secondary | ICD-10-CM

## 2012-12-22 LAB — PROTIME-INR
INR: 2.82 — ABNORMAL HIGH (ref <1.50)
Prothrombin Time: 28.9 seconds — ABNORMAL HIGH (ref 11.6–15.2)

## 2012-12-22 NOTE — Telephone Encounter (Signed)
Pt informed of the following,Repeat confirmation is ok. 5 mg every day. Recheck in 1 week. Joy Anderson, Joy Anderson

## 2013-01-10 ENCOUNTER — Telehealth: Payer: Self-pay | Admitting: *Deleted

## 2013-01-10 ENCOUNTER — Ambulatory Visit (INDEPENDENT_AMBULATORY_CARE_PROVIDER_SITE_OTHER): Payer: Medicare Other | Admitting: Family Medicine

## 2013-01-10 VITALS — BP 154/85 | HR 86

## 2013-01-10 DIAGNOSIS — D6859 Other primary thrombophilia: Secondary | ICD-10-CM

## 2013-01-10 DIAGNOSIS — D6861 Antiphospholipid syndrome: Secondary | ICD-10-CM

## 2013-01-10 DIAGNOSIS — I82409 Acute embolism and thrombosis of unspecified deep veins of unspecified lower extremity: Secondary | ICD-10-CM

## 2013-01-10 LAB — POCT INR: INR: 2.3

## 2013-01-10 NOTE — Progress Notes (Signed)
   Subjective:    Patient ID: Joy Anderson, female    DOB: 07/16/1950, 63 y.o.   MRN: [id number]  HPI   Inr done today.  Review of Systems     Objective:   Physical Exam        Assessment & Plan:  Pt wanted to know what she could take for cold sxs that wouldn't interfere with her BP medication. I printed out a handout with the Coricidin products on.

## 2013-01-10 NOTE — Telephone Encounter (Signed)
Continue current dose. 5 mg every day. Recheck in 2 week. lvm w/med dosage.Joy Anderson

## 2013-01-21 ENCOUNTER — Ambulatory Visit (INDEPENDENT_AMBULATORY_CARE_PROVIDER_SITE_OTHER): Payer: Medicare Other | Admitting: Family Medicine

## 2013-01-21 ENCOUNTER — Encounter: Payer: Self-pay | Admitting: Family Medicine

## 2013-01-21 VITALS — BP 136/85 | HR 113 | Temp 99.1°F | Ht 62.0 in | Wt 160.0 lb

## 2013-01-21 DIAGNOSIS — J209 Acute bronchitis, unspecified: Secondary | ICD-10-CM

## 2013-01-21 DIAGNOSIS — J019 Acute sinusitis, unspecified: Secondary | ICD-10-CM | POA: Diagnosis not present

## 2013-01-21 MED ORDER — AMOXICILLIN-POT CLAVULANATE 875-125 MG PO TABS: 1.0000 | ORAL_TABLET | Freq: Two times a day (BID) | ORAL | Status: DC

## 2013-01-21 NOTE — Progress Notes (Signed)
   Subjective:    Patient ID: Joy Anderson, female    DOB: [birth year], 63 y.o.   MRN: [id number]  HPI Nasal congestion an cough x 2 weeks. Thick drainage w/ no color  No fever, chills, or sweats.  Noticed some blood in her sputum.  Cough not keeping her awake.  + runny nose.  Left ear pressure. Mild scratchey throat.  No diarrhea with.  No meds OTC for it.    Review of Systems     Objective:   Physical Exam  Constitutional: She is oriented to person, place, and time. She appears well-developed and well-nourished.  HENT:  Head: Normocephalic and atraumatic.  Right Ear: External ear normal.  Left Ear: External ear normal.  Nose: Nose normal.  Mouth/Throat: Oropharynx is clear and moist.  TMs and canals are clear.   Eyes: Conjunctivae and EOM are normal. Pupils are equal, round, and reactive to light.  Neck: Neck supple. No thyromegaly present.  Cardiovascular: Normal rate, regular rhythm and normal heart sounds.   Pulmonary/Chest: Effort normal and breath sounds normal. She has no wheezes.  Lymphadenopathy:    She has no cervical adenopathy.  Neurological: She is alert and oriented to person, place, and time.  Skin: Skin is warm and dry.  Psychiatric: She has a normal mood and affect.          Assessment & Plan:  Acute sinusitis/bronchitis - will treat with Augmentin since she's had symptoms for at least 2 weeks and if not improving. Call if not significantly better in one week. Continue symptomatic care. She is immunosuppressed with her lupus and medications.

## 2013-02-24 DIAGNOSIS — N184 Chronic kidney disease, stage 4 (severe): Secondary | ICD-10-CM | POA: Diagnosis not present

## 2013-02-24 DIAGNOSIS — N039 Chronic nephritic syndrome with unspecified morphologic changes: Secondary | ICD-10-CM | POA: Diagnosis not present

## 2013-02-24 DIAGNOSIS — D649 Anemia, unspecified: Secondary | ICD-10-CM | POA: Diagnosis not present

## 2013-02-24 LAB — BASIC METABOLIC PANEL
BUN: 41 mg/dL — AB (ref 4–21)
Creatinine: 2.4 mg/dL — AB (ref 0.5–1.1)
Glucose: 143 mg/dL
POTASSIUM: 5.3 mmol/L (ref 3.4–5.3)
SODIUM: 145 mmol/L (ref 137–147)

## 2013-02-24 LAB — CBC AND DIFFERENTIAL: Hemoglobin: 9.8 g/dL — AB (ref 12.0–16.0)

## 2013-02-28 ENCOUNTER — Encounter: Payer: Self-pay | Admitting: Family Medicine

## 2013-02-28 ENCOUNTER — Ambulatory Visit: Payer: Self-pay | Admitting: Family Medicine

## 2013-02-28 ENCOUNTER — Ambulatory Visit (INDEPENDENT_AMBULATORY_CARE_PROVIDER_SITE_OTHER): Payer: Medicare Other | Admitting: Family Medicine

## 2013-02-28 VITALS — BP 137/86 | HR 88 | Wt 163.0 lb

## 2013-02-28 DIAGNOSIS — I10 Essential (primary) hypertension: Secondary | ICD-10-CM

## 2013-02-28 DIAGNOSIS — D6861 Antiphospholipid syndrome: Secondary | ICD-10-CM

## 2013-02-28 DIAGNOSIS — E785 Hyperlipidemia, unspecified: Secondary | ICD-10-CM | POA: Diagnosis not present

## 2013-02-28 DIAGNOSIS — E119 Type 2 diabetes mellitus without complications: Secondary | ICD-10-CM | POA: Diagnosis not present

## 2013-02-28 DIAGNOSIS — I82409 Acute embolism and thrombosis of unspecified deep veins of unspecified lower extremity: Secondary | ICD-10-CM

## 2013-02-28 LAB — POCT GLYCOSYLATED HEMOGLOBIN (HGB A1C): Hemoglobin A1C: 6.6

## 2013-02-28 LAB — POCT INR: INR: 1.9

## 2013-02-28 NOTE — Progress Notes (Signed)
   Subjective:    Patient ID: Joy Anderson, female    DOB: July 24, 1950, 63 y.o.   MRN: [id number]  HPI Diabetes - no hypoglycemic events. No wounds or sores that are not healing well. No increased thirst or urination. Checking glucose at home. Taking medications as prescribed without any side effects. No on predniosone.    Hypertension- Pt denies chest pain, SOB, dizziness, or heart palpitations.  Taking meds as directed w/o problems.  Denies medication side effects.    Due for INR today for anticoagulation for DVT. No bruising or easy bleeding. She's taking 5 mg daily of warfarin.   Review of Systems     Objective:   Physical Exam  Constitutional: She is oriented to person, place, and time. She appears well-developed and well-nourished.  HENT:  Head: Normocephalic and atraumatic.  Cardiovascular: Normal rate, regular rhythm and normal heart sounds.   Pulmonary/Chest: Effort normal and breath sounds normal.  Neurological: She is alert and oriented to person, place, and time.  Skin: Skin is warm and dry.  Psychiatric: She has a normal mood and affect. Her behavior is normal.          Assessment & Plan:  Diabetes- A1C is 6.6. Well controlled. Continue current regimen. Discussed again that we really need to keep her A1c under 6.5 or we will need to start a medication to help control her blood sugars. Also see note below about adding a statin. She is Re: on an ARB.  Lab Results  Component Value Date   HGBA1C 6.3 10/28/2012    Hypertension-well-controlled. Continue current regimen.  Anticoagulation - please see anticoagulation flowsheet for adjustments today. She is subtherapeutic. She currently takes 5 mg daily.  Hyperlipidemia - She is no longer taking lovaza. Discussed based on  Guidelines recommend statin for all diabetic 7 LDL greater than 100. Discussed this new guidelines with her. Encouraged her to think about it between now and when I see her back in 3 months. Warned  about potential side effects including myalgias.

## 2013-03-16 DIAGNOSIS — H409 Unspecified glaucoma: Secondary | ICD-10-CM | POA: Diagnosis not present

## 2013-03-16 DIAGNOSIS — H4010X0 Unspecified open-angle glaucoma, stage unspecified: Secondary | ICD-10-CM | POA: Diagnosis not present

## 2013-03-22 ENCOUNTER — Encounter: Payer: Self-pay | Admitting: *Deleted

## 2013-04-01 ENCOUNTER — Ambulatory Visit (INDEPENDENT_AMBULATORY_CARE_PROVIDER_SITE_OTHER): Payer: Medicare Other | Admitting: Family Medicine

## 2013-04-01 ENCOUNTER — Ambulatory Visit (INDEPENDENT_AMBULATORY_CARE_PROVIDER_SITE_OTHER): Payer: Medicare Other

## 2013-04-01 ENCOUNTER — Ambulatory Visit: Payer: Self-pay | Admitting: Family Medicine

## 2013-04-01 ENCOUNTER — Encounter: Payer: Self-pay | Admitting: Family Medicine

## 2013-04-01 VITALS — BP 132/75 | HR 93 | Wt 163.0 lb

## 2013-04-01 DIAGNOSIS — M169 Osteoarthritis of hip, unspecified: Secondary | ICD-10-CM | POA: Diagnosis not present

## 2013-04-01 DIAGNOSIS — M25552 Pain in left hip: Secondary | ICD-10-CM

## 2013-04-01 DIAGNOSIS — M545 Low back pain, unspecified: Secondary | ICD-10-CM

## 2013-04-01 DIAGNOSIS — M79609 Pain in unspecified limb: Secondary | ICD-10-CM

## 2013-04-01 DIAGNOSIS — I82409 Acute embolism and thrombosis of unspecified deep veins of unspecified lower extremity: Secondary | ICD-10-CM

## 2013-04-01 DIAGNOSIS — M533 Sacrococcygeal disorders, not elsewhere classified: Secondary | ICD-10-CM | POA: Diagnosis not present

## 2013-04-01 DIAGNOSIS — D6861 Antiphospholipid syndrome: Secondary | ICD-10-CM

## 2013-04-01 DIAGNOSIS — M1610 Unilateral primary osteoarthritis, unspecified hip: Secondary | ICD-10-CM | POA: Diagnosis not present

## 2013-04-01 DIAGNOSIS — M25559 Pain in unspecified hip: Secondary | ICD-10-CM | POA: Diagnosis not present

## 2013-04-01 LAB — POCT INR: INR: 1.9

## 2013-04-01 MED ORDER — ACETAMINOPHEN-CODEINE #3 300-30 MG PO TABS: 0.5000 | ORAL_TABLET | Freq: Three times a day (TID) | ORAL | Status: DC | PRN

## 2013-04-01 NOTE — Progress Notes (Signed)
   Subjective:    Patient ID: Joy Anderson, female    DOB: 1950/11/28, 63 y.o.   MRN: [id number]  HPI Complains of left upper thigh pain. Smear the groin crease and is radiating down towards the knee. She's getting to the point where it's difficult tool, it's keeping her from doing housework. It's difficult to bend down. She says the pain originally started just near the groin crease about a year ago. Check sugars are sports medicine doctor for it. She did physical therapy and improved greatly. She never received an injection or other therapy. She was given tramadol for pain at that time but says it really did not help. Over the last couple of months the pain has been getting progressively worse and is now radiating towards her lower back and down her leg. She describes the pain as an aching. Sometimes makes her knee hurt as well. Her pain as a 7/10 at its worst. She also feels like it swells. Denies any increased warmth. She really hasn't taken any over-the-counter meds recently for this. Her diabetes has been well controlled. She was on Acthar for her SLE , but came off last month. She's concerned that it may have caused her pain.  She has to use a walker at times.     Review of Systems     Objective:   Physical Exam  Constitutional: She is oriented to person, place, and time. She appears well-developed and well-nourished.  HENT:  Head: Normocephalic and atraumatic.  Neurological: She is alert and oriented to person, place, and time.  Skin: Skin is warm and dry.  Psychiatric: She has a normal mood and affect. Her behavior is normal.          Assessment & Plan:  Left hip pain - she actually had fairly normal x-ray performed in may and 2014. She did improve with physical therapy but at this point her symptoms have progressed and are worse. Discussed options of seeing Dr. Dianah Field back again for possible hip joint injection versus moving forward with further evaluation, MRI. I did give  her Tylenol #3 for pain control. She fell he tramadol was not helpful, but felt like Vicodin with strong.  Low back pina/left SI joint tenderness-  Will get an xray to   A better look at her pain. May be causing some of the radiating pain she is experiencing.   DVT - see anticoagulation flowsheet for adjustements.

## 2013-04-04 ENCOUNTER — Other Ambulatory Visit: Payer: Self-pay | Admitting: *Deleted

## 2013-04-04 DIAGNOSIS — M25552 Pain in left hip: Secondary | ICD-10-CM

## 2013-04-05 ENCOUNTER — Telehealth: Payer: Self-pay | Admitting: *Deleted

## 2013-04-05 NOTE — Telephone Encounter (Signed)
Called to verify eligibility through [facility] and as of [id number] policy was terminated.  No precert needed for MRI left hip through Medicare A B. Clemetine Marker, LPN

## 2013-04-09 ENCOUNTER — Ambulatory Visit (HOSPITAL_BASED_OUTPATIENT_CLINIC_OR_DEPARTMENT_OTHER)
Admission: RE | Admit: 2013-04-09 | Discharge: 2013-04-09 | Disposition: A | Discharge status: Home / Self Care | Payer: Medicare Other | Source: Ambulatory Visit | Attending: Family Medicine | Admitting: Family Medicine

## 2013-04-09 ENCOUNTER — Other Ambulatory Visit: Payer: Self-pay | Admitting: Family Medicine

## 2013-04-09 DIAGNOSIS — M25552 Pain in left hip: Secondary | ICD-10-CM

## 2013-04-09 DIAGNOSIS — M25559 Pain in unspecified hip: Secondary | ICD-10-CM | POA: Insufficient documentation

## 2013-04-09 DIAGNOSIS — M87059 Idiopathic aseptic necrosis of unspecified femur: Secondary | ICD-10-CM | POA: Diagnosis not present

## 2013-04-11 DIAGNOSIS — D649 Anemia, unspecified: Secondary | ICD-10-CM | POA: Diagnosis not present

## 2013-04-11 DIAGNOSIS — N039 Chronic nephritic syndrome with unspecified morphologic changes: Secondary | ICD-10-CM | POA: Diagnosis not present

## 2013-04-11 LAB — BASIC METABOLIC PANEL
BUN: 39 mg/dL — AB (ref 4–21)
CREATININE: 2.6 mg/dL — AB (ref 0.5–1.1)
Glucose: 89 mg/dL
Potassium: 5.5 mmol/L — AB (ref 3.4–5.3)
SODIUM: 141 mmol/L (ref 137–147)

## 2013-04-11 LAB — CBC AND DIFFERENTIAL
Hemoglobin: 8.5 g/dL — AB (ref 12.0–16.0)
Platelets: 285 10*3/uL (ref 150–399)
WBC: 4.4 10^3/mL

## 2013-04-13 ENCOUNTER — Other Ambulatory Visit: Payer: Self-pay | Admitting: Family Medicine

## 2013-04-13 DIAGNOSIS — M87051 Idiopathic aseptic necrosis of right femur: Secondary | ICD-10-CM

## 2013-04-13 DIAGNOSIS — M87052 Idiopathic aseptic necrosis of left femur: Secondary | ICD-10-CM

## 2013-04-14 ENCOUNTER — Encounter: Payer: Self-pay | Admitting: *Deleted

## 2013-04-18 ENCOUNTER — Telehealth: Payer: Self-pay | Admitting: *Deleted

## 2013-04-18 NOTE — Telephone Encounter (Signed)
Mri results faxed to Dr. Jac Canavan.Joy Anderson

## 2013-04-19 DIAGNOSIS — M87059 Idiopathic aseptic necrosis of unspecified femur: Secondary | ICD-10-CM | POA: Diagnosis not present

## 2013-04-22 DIAGNOSIS — M76829 Posterior tibial tendinitis, unspecified leg: Secondary | ICD-10-CM | POA: Diagnosis not present

## 2013-05-23 ENCOUNTER — Ambulatory Visit (INDEPENDENT_AMBULATORY_CARE_PROVIDER_SITE_OTHER): Payer: Medicare Other | Admitting: Family Medicine

## 2013-05-23 ENCOUNTER — Ambulatory Visit: Payer: Self-pay | Admitting: Family Medicine

## 2013-05-23 ENCOUNTER — Encounter: Payer: Self-pay | Admitting: Family Medicine

## 2013-05-23 VITALS — BP 129/78 | HR 89 | Wt 163.0 lb

## 2013-05-23 DIAGNOSIS — I82409 Acute embolism and thrombosis of unspecified deep veins of unspecified lower extremity: Secondary | ICD-10-CM | POA: Diagnosis not present

## 2013-05-23 DIAGNOSIS — D6861 Antiphospholipid syndrome: Secondary | ICD-10-CM

## 2013-05-23 DIAGNOSIS — M8700 Idiopathic aseptic necrosis of unspecified bone: Secondary | ICD-10-CM | POA: Diagnosis not present

## 2013-05-23 DIAGNOSIS — D6859 Other primary thrombophilia: Secondary | ICD-10-CM

## 2013-05-23 DIAGNOSIS — E119 Type 2 diabetes mellitus without complications: Secondary | ICD-10-CM | POA: Diagnosis not present

## 2013-05-23 LAB — POCT GLYCOSYLATED HEMOGLOBIN (HGB A1C): HEMOGLOBIN A1C: 6.4

## 2013-05-23 LAB — POCT INR: INR: 2.6

## 2013-05-23 NOTE — Progress Notes (Signed)
   Subjective:    Patient ID: Joy Anderson, female    DOB: Feb 08, 1950, 63 y.o.   MRN: [id number]  HPI  Diabetes - no hypoglycemic events. No wounds or sores that are not healing well. No increased thirst or urination. Checking glucose at home. Taking medications as prescribed without any side effects. Vaccines, urine microalbumin, eye exam all up to date.  DVT-followup Coumadin level. No excess bruising or bleeding.  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 She did see the orthopedist and they aren't planning on surgery. She says to see him back next month to determine when they're going to do a hip replacement.  Review of Systems     Objective:   Physical Exam  Constitutional: She is oriented to person, place, and time. She appears well-developed and well-nourished.  HENT:  Head: Normocephalic and atraumatic.  Cardiovascular: Normal rate, regular rhythm and normal heart sounds.   Pulmonary/Chest: Effort normal and breath sounds normal.  Neurological: She is alert and oriented to person, place, and time.  Skin: Skin is warm and dry.  Psychiatric: She has a normal mood and affect. Her behavior is normal.          Assessment & Plan:  Diabetes-well-controlled on current regimen. Followup in 3-4 months. Lab Results  Component Value Date   HGBA1C 6.4 05/23/2013    DVT-see Coumadin flow sheet for adjustments. Repeat in 3 weeks. Well-controlled today.  Avascular course of the hip-continue to follow the orthopedist. She will definitely need hip replacement. Right now her diabetes is very well controlled as well as blood pressure. she will need to come off of her Coumadin for surgery.

## 2013-05-23 NOTE — Progress Notes (Signed)
Pt here for INR check no missed doses, diet changes,bruising,bleeding,CP,SOB.Joy Anderson

## 2013-05-31 ENCOUNTER — Other Ambulatory Visit: Payer: Self-pay | Admitting: Family Medicine

## 2013-06-13 DIAGNOSIS — N184 Chronic kidney disease, stage 4 (severe): Secondary | ICD-10-CM | POA: Diagnosis not present

## 2013-06-13 DIAGNOSIS — N039 Chronic nephritic syndrome with unspecified morphologic changes: Secondary | ICD-10-CM | POA: Diagnosis not present

## 2013-06-13 DIAGNOSIS — D649 Anemia, unspecified: Secondary | ICD-10-CM | POA: Diagnosis not present

## 2013-06-13 DIAGNOSIS — I10 Essential (primary) hypertension: Secondary | ICD-10-CM | POA: Diagnosis not present

## 2013-06-13 LAB — PTH, INTACT: PTH Interp: 135

## 2013-06-13 LAB — FERRITIN, SERUM (SERIAL): Ferritin: 75

## 2013-06-13 LAB — IRON: Iron: 55

## 2013-06-13 LAB — BASIC METABOLIC PANEL
BUN: 27 mg/dL — AB (ref 4–21)
CREATININE: 2.8 mg/dL — AB (ref 0.5–1.1)

## 2013-06-13 LAB — CBC: MCV: 78 fL

## 2013-06-13 LAB — IRON AND TIBC: TIBC: 306

## 2013-06-13 LAB — CBC AND DIFFERENTIAL: Hemoglobin: 8.6 g/dL — AB (ref 12.0–16.0)

## 2013-06-13 LAB — PROTEIN / CREATININE INDEX, URINE: PROTEIN/CREAT RATIO: 5884

## 2013-06-13 LAB — OTHER LAB TEST: %SAT: 18

## 2013-06-28 ENCOUNTER — Other Ambulatory Visit: Payer: Self-pay | Admitting: Family Medicine

## 2013-06-29 ENCOUNTER — Encounter: Payer: Self-pay | Admitting: Family Medicine

## 2013-06-30 DIAGNOSIS — N184 Chronic kidney disease, stage 4 (severe): Secondary | ICD-10-CM | POA: Diagnosis not present

## 2013-06-30 DIAGNOSIS — D509 Iron deficiency anemia, unspecified: Secondary | ICD-10-CM | POA: Diagnosis not present

## 2013-07-07 DIAGNOSIS — M1610 Unilateral primary osteoarthritis, unspecified hip: Secondary | ICD-10-CM | POA: Diagnosis not present

## 2013-07-07 DIAGNOSIS — N184 Chronic kidney disease, stage 4 (severe): Secondary | ICD-10-CM | POA: Diagnosis not present

## 2013-07-07 DIAGNOSIS — M87059 Idiopathic aseptic necrosis of unspecified femur: Secondary | ICD-10-CM | POA: Diagnosis not present

## 2013-07-07 DIAGNOSIS — M25559 Pain in unspecified hip: Secondary | ICD-10-CM | POA: Diagnosis not present

## 2013-07-07 DIAGNOSIS — M169 Osteoarthritis of hip, unspecified: Secondary | ICD-10-CM | POA: Diagnosis not present

## 2013-07-07 DIAGNOSIS — D509 Iron deficiency anemia, unspecified: Secondary | ICD-10-CM | POA: Diagnosis not present

## 2013-07-07 DIAGNOSIS — N189 Chronic kidney disease, unspecified: Secondary | ICD-10-CM | POA: Diagnosis not present

## 2013-07-20 DIAGNOSIS — M329 Systemic lupus erythematosus, unspecified: Secondary | ICD-10-CM | POA: Diagnosis not present

## 2013-08-24 ENCOUNTER — Ambulatory Visit (INDEPENDENT_AMBULATORY_CARE_PROVIDER_SITE_OTHER): Payer: Medicare Other | Admitting: Family Medicine

## 2013-08-24 VITALS — BP 131/77 | HR 79 | Ht 62.0 in | Wt 162.0 lb

## 2013-08-24 DIAGNOSIS — D6859 Other primary thrombophilia: Secondary | ICD-10-CM

## 2013-08-24 DIAGNOSIS — I82409 Acute embolism and thrombosis of unspecified deep veins of unspecified lower extremity: Secondary | ICD-10-CM | POA: Diagnosis not present

## 2013-08-24 DIAGNOSIS — D6861 Antiphospholipid syndrome: Secondary | ICD-10-CM

## 2013-08-24 LAB — POCT INR: INR: 3.4

## 2013-08-24 NOTE — Progress Notes (Signed)
I called Joy Anderson and she voiced understanding and repeated back new dosing schedule and when to follow up in office. Margette Fast, CMA

## 2013-09-14 DIAGNOSIS — N184 Chronic kidney disease, stage 4 (severe): Secondary | ICD-10-CM | POA: Diagnosis not present

## 2013-09-14 DIAGNOSIS — N039 Chronic nephritic syndrome with unspecified morphologic changes: Secondary | ICD-10-CM | POA: Diagnosis not present

## 2013-09-14 DIAGNOSIS — D649 Anemia, unspecified: Secondary | ICD-10-CM | POA: Diagnosis not present

## 2013-09-14 DIAGNOSIS — I10 Essential (primary) hypertension: Secondary | ICD-10-CM | POA: Diagnosis not present

## 2013-09-21 DIAGNOSIS — H2510 Age-related nuclear cataract, unspecified eye: Secondary | ICD-10-CM | POA: Diagnosis not present

## 2013-09-21 DIAGNOSIS — E119 Type 2 diabetes mellitus without complications: Secondary | ICD-10-CM | POA: Diagnosis not present

## 2013-09-21 DIAGNOSIS — H409 Unspecified glaucoma: Secondary | ICD-10-CM | POA: Diagnosis not present

## 2013-09-21 DIAGNOSIS — H4011X0 Primary open-angle glaucoma, stage unspecified: Secondary | ICD-10-CM | POA: Diagnosis not present

## 2013-09-23 ENCOUNTER — Encounter: Payer: Self-pay | Admitting: Family Medicine

## 2013-09-23 ENCOUNTER — Ambulatory Visit (INDEPENDENT_AMBULATORY_CARE_PROVIDER_SITE_OTHER): Payer: Medicare Other | Admitting: Family Medicine

## 2013-09-23 ENCOUNTER — Ambulatory Visit: Payer: Self-pay | Admitting: Family Medicine

## 2013-09-23 VITALS — BP 134/82 | HR 79 | Ht 62.0 in | Wt 158.0 lb

## 2013-09-23 DIAGNOSIS — E119 Type 2 diabetes mellitus without complications: Secondary | ICD-10-CM | POA: Diagnosis not present

## 2013-09-23 DIAGNOSIS — D6859 Other primary thrombophilia: Secondary | ICD-10-CM | POA: Diagnosis not present

## 2013-09-23 DIAGNOSIS — I82409 Acute embolism and thrombosis of unspecified deep veins of unspecified lower extremity: Secondary | ICD-10-CM

## 2013-09-23 DIAGNOSIS — D6861 Antiphospholipid syndrome: Secondary | ICD-10-CM

## 2013-09-23 DIAGNOSIS — I10 Essential (primary) hypertension: Secondary | ICD-10-CM | POA: Diagnosis not present

## 2013-09-23 LAB — POCT INR: INR: 2.8

## 2013-09-23 LAB — POCT GLYCOSYLATED HEMOGLOBIN (HGB A1C): Hemoglobin A1C: 5.9

## 2013-09-23 NOTE — Assessment & Plan Note (Signed)
Well-controlled. Continue current regimen. She is currently on metoprolol, losartan, amlodipine, Lasix.

## 2013-09-23 NOTE — Assessment & Plan Note (Addendum)
Well controlled. Hemoglobin A1c is 5.9 today. On an ARB. Not on a statin. Continue current regimen. Followup in 3 months. Just had eye exam on Tuesday of this week at [facility]. Will call to get a copy of the report.

## 2013-09-23 NOTE — Progress Notes (Signed)
   Subjective:    Patient ID: NIXON RULLI, female    DOB: 09-06-1950, 63 y.o.   MRN: [id number]  HPI Hypertension- Pt denies chest pain, SOB, dizziness, or heart palpitations.  Taking meds as directed w/o problems.  Denies medication side effects.    Diabetes - no hypoglycemic events. No wounds or sores that are not healing well. No increased thirst or urination. Not checking glucose at home. Taking medications as prescribed without any side effects.  She is on Coumadin one half tabs on Tuesday and one whole tablet all other days of the week. She denies any bruising or bleeding.  Review of Systems     Objective:   Physical Exam  Constitutional: She is oriented to person, place, and time. She appears well-developed and well-nourished.  HENT:  Head: Normocephalic and atraumatic.  Eyes: Conjunctivae are normal. Pupils are equal, round, and reactive to light.  Cardiovascular: Normal rate, regular rhythm and normal heart sounds.   Pulmonary/Chest: Effort normal and breath sounds normal.  Musculoskeletal: She exhibits no edema.  Neurological: She is alert and oriented to person, place, and time.  Skin: Skin is warm and dry.  Psychiatric: She has a normal mood and affect. Her behavior is normal.          Assessment & Plan:

## 2013-09-23 NOTE — Assessment & Plan Note (Signed)
See anticoagulation flowsheet for Coumadin

## 2013-09-29 ENCOUNTER — Other Ambulatory Visit: Payer: Self-pay | Admitting: Family Medicine

## 2013-09-30 ENCOUNTER — Other Ambulatory Visit: Payer: Self-pay | Admitting: Family Medicine

## 2013-10-27 ENCOUNTER — Other Ambulatory Visit: Payer: Self-pay | Admitting: Family Medicine

## 2013-10-31 ENCOUNTER — Other Ambulatory Visit: Payer: Self-pay | Admitting: Family Medicine

## 2013-10-31 DIAGNOSIS — Z1231 Encounter for screening mammogram for malignant neoplasm of breast: Secondary | ICD-10-CM

## 2013-10-31 DIAGNOSIS — Z139 Encounter for screening, unspecified: Secondary | ICD-10-CM

## 2013-11-01 ENCOUNTER — Ambulatory Visit: Payer: Self-pay | Admitting: Family Medicine

## 2013-11-01 ENCOUNTER — Ambulatory Visit (INDEPENDENT_AMBULATORY_CARE_PROVIDER_SITE_OTHER): Payer: Medicare Other | Admitting: Family Medicine

## 2013-11-01 ENCOUNTER — Encounter: Payer: Self-pay | Admitting: Family Medicine

## 2013-11-01 VITALS — BP 139/79 | HR 83 | Temp 97.6°F | Ht 62.0 in | Wt 157.0 lb

## 2013-11-01 DIAGNOSIS — Z23 Encounter for immunization: Secondary | ICD-10-CM

## 2013-11-01 DIAGNOSIS — K649 Unspecified hemorrhoids: Secondary | ICD-10-CM | POA: Diagnosis not present

## 2013-11-01 DIAGNOSIS — I82409 Acute embolism and thrombosis of unspecified deep veins of unspecified lower extremity: Secondary | ICD-10-CM

## 2013-11-01 DIAGNOSIS — D6861 Antiphospholipid syndrome: Secondary | ICD-10-CM

## 2013-11-01 LAB — POCT INR: INR: 2.1

## 2013-11-01 MED ORDER — LIDOCAINE-HYDROCORTISONE ACE 2-2 % RE KIT: 1.0000 | PACK | Freq: Three times a day (TID) | RECTAL | Status: DC | PRN

## 2013-11-01 NOTE — Progress Notes (Signed)
.  Pt here for INR check no missed doses, diet changes,bruising,bleeding,CP,SOB.Joy Anderson   

## 2013-11-01 NOTE — Patient Instructions (Signed)

## 2013-11-01 NOTE — Progress Notes (Signed)
   Subjective:    Patient ID: Joy Anderson, female    DOB: Jun 08, 1950, 63 y.o.   MRN: [id number]  HPI Hemorrhoids flaring for 3-4 weeks. Using OTC meds that aren't really working. Tried preparation H and an anti biotic ointment.  She denies hard stools or feeling constipated. In fact she says she tends to have frequent bowel movements. Often 4 a day. She notices that her symptoms are more prominent on today's were she has more frequent bowel movements and seems to bother her less on days where she only has one bowel movement. She felt a bulge near her rectal area. He denies any bleeding.   Review of Systems     Objective:   Physical Exam  Constitutional: She appears well-developed and well-nourished.  Genitourinary:  Rectum with normal sphincter tone. No masses on palpation.  She doe have an internal hemorro on the right internally with  A samll appo 1 cm white ulceration.  No active bleeding. No external lesion or hemorrhoids.   Skin: Skin is warm and dry.  Psychiatric: She has a normal mood and affect. Her behavior is normal.          Assessment & Plan:  Internal hemorrhoid with ulceration-will treat with rectal suppository. If not improving in the next week or 2 then please let me know. Recommend increase fiber to help bulk the stools to help decrease frequency. Call if any changes, not improving, or noticing any blood with bowel movement.  Antiphospholipid antibody syndrome-see flow sheet for Coumadin adjustment.

## 2013-11-03 ENCOUNTER — Other Ambulatory Visit: Payer: Self-pay | Admitting: *Deleted

## 2013-11-10 ENCOUNTER — Ambulatory Visit (INDEPENDENT_AMBULATORY_CARE_PROVIDER_SITE_OTHER): Payer: Medicare Other

## 2013-11-10 DIAGNOSIS — Z1231 Encounter for screening mammogram for malignant neoplasm of breast: Secondary | ICD-10-CM | POA: Diagnosis not present

## 2013-11-17 DIAGNOSIS — M87052 Idiopathic aseptic necrosis of left femur: Secondary | ICD-10-CM | POA: Diagnosis not present

## 2013-11-18 ENCOUNTER — Other Ambulatory Visit: Payer: Self-pay | Admitting: Family Medicine

## 2013-12-15 DIAGNOSIS — I10 Essential (primary) hypertension: Secondary | ICD-10-CM | POA: Diagnosis not present

## 2013-12-15 DIAGNOSIS — N184 Chronic kidney disease, stage 4 (severe): Secondary | ICD-10-CM | POA: Diagnosis not present

## 2013-12-15 DIAGNOSIS — N08 Glomerular disorders in diseases classified elsewhere: Secondary | ICD-10-CM | POA: Diagnosis not present

## 2013-12-15 DIAGNOSIS — N269 Renal sclerosis, unspecified: Secondary | ICD-10-CM | POA: Diagnosis not present

## 2013-12-28 ENCOUNTER — Other Ambulatory Visit: Payer: Self-pay | Admitting: Family Medicine

## 2014-01-06 HISTORY — PX: AV FISTULA PLACEMENT: SHX1204

## 2014-01-12 ENCOUNTER — Ambulatory Visit (INDEPENDENT_AMBULATORY_CARE_PROVIDER_SITE_OTHER): Payer: Commercial Managed Care - HMO | Admitting: Family Medicine

## 2014-01-12 VITALS — BP 140/82 | HR 76

## 2014-01-12 DIAGNOSIS — D6861 Antiphospholipid syndrome: Secondary | ICD-10-CM | POA: Diagnosis not present

## 2014-01-12 LAB — POCT INR: INR: 2.5

## 2014-01-13 NOTE — Progress Notes (Signed)
   Subjective:    Patient ID: Joy Anderson, female    DOB: 09/18/1950, 64 y.o.   MRN: [id number] Left instructions on vm. Beatris Ship, CMA HPI    Review of Systems     Objective:   Physical Exam        Assessment & Plan:

## 2014-01-23 ENCOUNTER — Ambulatory Visit: Payer: Medicare Other | Admitting: Family Medicine

## 2014-01-23 DIAGNOSIS — Z0289 Encounter for other administrative examinations: Secondary | ICD-10-CM

## 2014-01-26 ENCOUNTER — Other Ambulatory Visit: Payer: Self-pay | Admitting: Family Medicine

## 2014-02-08 ENCOUNTER — Other Ambulatory Visit: Payer: Self-pay | Admitting: Family Medicine

## 2014-02-28 ENCOUNTER — Other Ambulatory Visit: Payer: Self-pay | Admitting: Family Medicine

## 2014-03-20 DIAGNOSIS — N051 Unspecified nephritic syndrome with focal and segmental glomerular lesions: Secondary | ICD-10-CM | POA: Diagnosis not present

## 2014-03-20 DIAGNOSIS — D631 Anemia in chronic kidney disease: Secondary | ICD-10-CM | POA: Diagnosis not present

## 2014-03-20 DIAGNOSIS — M109 Gout, unspecified: Secondary | ICD-10-CM | POA: Diagnosis not present

## 2014-03-29 ENCOUNTER — Other Ambulatory Visit: Payer: Self-pay | Admitting: Family Medicine

## 2014-03-29 DIAGNOSIS — H4011X1 Primary open-angle glaucoma, mild stage: Secondary | ICD-10-CM | POA: Diagnosis not present

## 2014-03-29 DIAGNOSIS — H2513 Age-related nuclear cataract, bilateral: Secondary | ICD-10-CM | POA: Diagnosis not present

## 2014-03-30 ENCOUNTER — Ambulatory Visit (INDEPENDENT_AMBULATORY_CARE_PROVIDER_SITE_OTHER): Payer: Commercial Managed Care - HMO | Admitting: Family Medicine

## 2014-03-30 VITALS — BP 137/79 | HR 80 | Ht 62.0 in | Wt 158.0 lb

## 2014-03-30 DIAGNOSIS — D6861 Antiphospholipid syndrome: Secondary | ICD-10-CM

## 2014-03-30 DIAGNOSIS — D6859 Other primary thrombophilia: Secondary | ICD-10-CM | POA: Diagnosis not present

## 2014-03-30 LAB — PROTIME-INR
INR: 1.54 — ABNORMAL HIGH (ref <1.50)
Prothrombin Time: 18.7 seconds — ABNORMAL HIGH (ref 11.6–15.2)

## 2014-03-30 LAB — POCT INR: INR: 1.9

## 2014-04-04 ENCOUNTER — Telehealth: Payer: Self-pay | Admitting: *Deleted

## 2014-04-04 NOTE — Telephone Encounter (Signed)
   Increase current dose. 7.5mg  on Monday and Thursday and 5 mg all other days. Recheck in 2 week.     Pt informed of med changes.Joy Anderson

## 2014-04-25 ENCOUNTER — Encounter: Payer: Self-pay | Admitting: Family Medicine

## 2014-04-25 ENCOUNTER — Other Ambulatory Visit (HOSPITAL_COMMUNITY)
Admission: RE | Admit: 2014-04-25 | Discharge: 2014-04-25 | Disposition: A | Discharge status: Home / Self Care | Payer: Commercial Managed Care - HMO | Source: Ambulatory Visit | Attending: Family Medicine | Admitting: Family Medicine

## 2014-04-25 ENCOUNTER — Ambulatory Visit (INDEPENDENT_AMBULATORY_CARE_PROVIDER_SITE_OTHER): Payer: Commercial Managed Care - HMO | Admitting: Family Medicine

## 2014-04-25 ENCOUNTER — Ambulatory Visit: Payer: Self-pay | Admitting: Family Medicine

## 2014-04-25 VITALS — BP 138/84 | HR 72 | Ht 62.0 in | Wt 159.0 lb

## 2014-04-25 DIAGNOSIS — Z1151 Encounter for screening for human papillomavirus (HPV): Secondary | ICD-10-CM | POA: Insufficient documentation

## 2014-04-25 DIAGNOSIS — I82409 Acute embolism and thrombosis of unspecified deep veins of unspecified lower extremity: Secondary | ICD-10-CM | POA: Diagnosis not present

## 2014-04-25 DIAGNOSIS — Z124 Encounter for screening for malignant neoplasm of cervix: Secondary | ICD-10-CM | POA: Insufficient documentation

## 2014-04-25 DIAGNOSIS — Z0000 Encounter for general adult medical examination without abnormal findings: Secondary | ICD-10-CM | POA: Diagnosis not present

## 2014-04-25 DIAGNOSIS — Z13220 Encounter for screening for lipoid disorders: Secondary | ICD-10-CM

## 2014-04-25 DIAGNOSIS — Z114 Encounter for screening for human immunodeficiency virus [HIV]: Secondary | ICD-10-CM

## 2014-04-25 DIAGNOSIS — D509 Iron deficiency anemia, unspecified: Secondary | ICD-10-CM

## 2014-04-25 DIAGNOSIS — I10 Essential (primary) hypertension: Secondary | ICD-10-CM | POA: Diagnosis not present

## 2014-04-25 DIAGNOSIS — Z23 Encounter for immunization: Secondary | ICD-10-CM

## 2014-04-25 DIAGNOSIS — Z1159 Encounter for screening for other viral diseases: Secondary | ICD-10-CM | POA: Diagnosis not present

## 2014-04-25 DIAGNOSIS — E119 Type 2 diabetes mellitus without complications: Secondary | ICD-10-CM

## 2014-04-25 DIAGNOSIS — D6861 Antiphospholipid syndrome: Secondary | ICD-10-CM

## 2014-04-25 LAB — POCT INR: INR: 2.1

## 2014-04-25 MED ORDER — METOPROLOL SUCCINATE ER 100 MG PO TB24: ORAL_TABLET | ORAL | Status: DC

## 2014-04-25 MED ORDER — WARFARIN SODIUM 5 MG PO TABS: ORAL_TABLET | ORAL | Status: DC

## 2014-04-25 NOTE — Progress Notes (Signed)
Subjective:    Joy Anderson is a 64 y.o. female who presents for Medicare Annual/Subsequent preventive examination.  Preventive Screening-Counseling & Management  Tobacco History  Smoking status  . Never Smoker   Smokeless tobacco  . Not on file     Problems Prior to Visit 1. Her renal function is up to 3.7. They're talking about potential dialysis in the future. 2. She reports that her eye exam is up-to-date. They're following her glaucoma and cataracts.  Current Problems (verified) Patient Active Problem List   Diagnosis Date Noted  . Pain in joint of left hip 05/25/2012  . 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 Antiphospholipid antibody with hypercoagulable state 08/27/2010  . 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 Gout 04/17/2010  . PROPTOSIS 12/12/2009  . ANEMIA, IRON DEFICIENCY 11/20/2009  . FATTY LIVER DISEASE 10/26/2009  . DIABETES MELLITUS, TYPE II 09/20/2009  . DVT 09/20/2009  . PULMONARY HYPERTENSION, MILD 04/14/2008  . PALPITATIONS 03/22/2008  . RAYNAUD'S DISEASE 02/01/2008  . SLE 12/06/2007  . ESSENTIAL HYPERTENSION, BENIGN 11/30/2007  . POLYARTHRITIS 11/30/2007    Medications Prior to Visit Current Outpatient Prescriptions on File Prior to Visit  Medication Sig Dispense Refill  . acetaminophen-codeine (TYLENOL #3) 300-30 MG per tablet Take 0.5-1 tablets by mouth every 8 (eight) hours as needed for moderate pain. 45 tablet 0  . amLODipine (NORVASC) 5 MG tablet TAKE ONE TABLET BY MOUTH ONCE DAILY 90 tablet 0  . furosemide (LASIX) 20 MG tablet Take 20 mg by mouth daily.     Marland Kitchen HYDROcodone-acetaminophen (NORCO/VICODIN) 5-325 MG per tablet     . hydrocortisone 2.5 % cream Apply 1 application topically as needed.     . hydroxychloroquine (PLAQUENIL) 200 MG tablet Take 200 mg by mouth 2 (two) times daily.      Marland Kitchen KLOR-CON M20 20 MEQ tablet Take 20 mEq by mouth daily.     . Lidocaine-Hydrocortisone Ace 2-2  % KIT Place 1 Applicatorful rectally 3 (three) times daily as needed. 1 each 11  . losartan (COZAAR) 100 MG tablet Take 1 tablet (100 mg total) by mouth daily. 1 tablet 0  . metoprolol succinate (TOPROL-XL) 100 MG 24 hr tablet TAKE 1 TABLET BY MOUTH EVERY DAY AFTER A MEAL 30 tablet 0  . Vitamin D, Ergocalciferol, (DRISDOL) 50000 UNITS CAPS 50,000 Units every 7 (seven) days.     Marland Kitchen warfarin (COUMADIN) 5 MG tablet TAKE 1 TABLET BY MOUTH ON SUNDAY, WENESDAY, THURSDAY, FRIDAY, AND SATURDAY. THEN TAKE 1 AND 1/2 TABLET ON MONDAY AND TUESDAY 97 tablet 0   No current facility-administered medications on file prior to visit.    Current Medications (verified) Current Outpatient Prescriptions  Medication Sig Dispense Refill  . acetaminophen-codeine (TYLENOL #3) 300-30 MG per tablet Take 0.5-1 tablets by mouth every 8 (eight) hours as needed for moderate pain. 45 tablet 0  . amLODipine (NORVASC) 5 MG tablet TAKE ONE TABLET BY MOUTH ONCE DAILY 90 tablet 0  . furosemide (LASIX) 20 MG tablet Take 20 mg by mouth daily.     Marland Kitchen HYDROcodone-acetaminophen (NORCO/VICODIN) 5-325 MG per tablet     . hydrocortisone 2.5 % cream Apply 1 application topically as needed.     . hydroxychloroquine (PLAQUENIL) 200 MG tablet Take 200 mg by mouth 2 (two) times daily.      Marland Kitchen KLOR-CON M20 20 MEQ tablet Take 20 mEq by mouth daily.     . Lidocaine-Hydrocortisone Ace 2-2 % KIT Place 1 Applicatorful rectally  3 (three) times daily as needed. 1 each 11  . losartan (COZAAR) 100 MG tablet Take 1 tablet (100 mg total) by mouth daily. 1 tablet 0  . metoprolol succinate (TOPROL-XL) 100 MG 24 hr tablet TAKE 1 TABLET BY MOUTH EVERY DAY AFTER A MEAL 30 tablet 0  . Vitamin D, Ergocalciferol, (DRISDOL) 50000 UNITS CAPS 50,000 Units every 7 (seven) days.     Marland Kitchen warfarin (COUMADIN) 5 MG tablet TAKE 1 TABLET BY MOUTH ON SUNDAY, WENESDAY, THURSDAY, FRIDAY, AND SATURDAY. THEN TAKE 1 AND 1/2 TABLET ON MONDAY AND TUESDAY 97 tablet 0   No current  facility-administered medications for this visit.     Allergies (verified) Ace inhibitors; Fish oil; and Metformin and related   PAST HISTORY  Family History Family History  Problem Relation Age of Onset  . Alcohol abuse Father   . Cancer Brother     colon  . Cancer Brother     prostate  . Cerebral palsy Brother   . Diabetes Other     Social History History  Substance Use Topics  . Smoking status: Never Smoker   . Smokeless tobacco: Not on file  . Alcohol Use: Yes     Comment: occasional     Are there smokers in your home (other than you)? No  Risk Factors Current exercise habits: The patient does not participate in regular exercise at present.  Dietary issues discussed: None   Cardiac risk factors: diabetes mellitus, dyslipidemia and sedentary lifestyle.  Depression Screen (Note: if answer to either of the following is "Yes", a more complete depression screening is indicated)   Over the past two weeks, have you felt down, depressed or hopeless? No  Over the past two weeks, have you felt little interest or pleasure in doing things? No  Have you lost interest or pleasure in daily life? No  Do you often feel hopeless? No  Do you cry easily over simple problems? No  Activities of Daily Living In your present state of health, do you have any difficulty performing the following activities?:  Driving? No Managing money?  No Feeding yourself? No Getting from bed to chair? Yes Climbing a flight of stairs? Yes Preparing food and eating?: Yes Bathing or showering? No Getting dressed: No Getting to the toilet? Yes Using the toilet:Yes Moving around from place to place: Yes In the past year have you fallen or had a near fall?:No   Are you sexually active?  No  Do you have more than one partner?  No  Hearing Difficulties: No Do you often ask people to speak up or repeat themselves? No Do you experience ringing or noises in your ears? No Do you have difficulty  understanding soft or whispered voices? No   Do you feel that you have a problem with memory? Yes  Do you often misplace items? Yes  Do you feel safe at home?  Yes  Cognitive Testing  Alert? Yes  Normal Appearance?Yes  Oriented to person? Yes  Place? Yes   Time? Yes  Recall of three objects?  Yes  Can perform simple calculations? Yes  Displays appropriate judgment?Yes  Can read the correct time from a watch face?Yes   6 CIt score of 4/28 ( normal)    Advanced Directives have been discussed with the patient? No    List the Names of Other Physician/Practitioners you currently use: 1.    Indicate any recent Medical Services you may have received from other than [facility] providers in  the past year (date may be approximate).  Immunization History  Administered Date(s) Administered  . Influenza Whole 12/12/2009  . Influenza,inj,Quad PF,36+ Mos 10/14/2012, 11/01/2013  . Pneumococcal Polysaccharide-23 12/12/2009  . Tdap 09/06/2004    Screening Tests Health Maintenance  Topic Date Due  . Hepatitis C Screening  26-Jul-1950  . HIV Screening  03/30/1964  . PAP SMEAR  10/14/2013  . FOOT EXAM  10/28/2013  . HEMOGLOBIN A1C  03/24/2014  . PNA vac Low Risk Adult (1 of 2 - PCV13) 03/31/2014  . DEXA SCAN  03/31/2014  . INFLUENZA VACCINE  08/07/2014  . TETANUS/TDAP  09/07/2014  . OPHTHALMOLOGY EXAM  09/21/2014  . MAMMOGRAM  11/11/2015  . COLONOSCOPY  04/15/2018  . ZOSTAVAX  Addressed    All answers were reviewed with the patient and necessary referrals were made:  Kenyotta Dorfman, MD   04/25/2014   History reviewed: allergies, current medications, past family history, past medical history, past social history, past surgical history and problem list  Review of Systems A comprehensive review of systems was negative.    Objective:     Vision by Snellen chart: Eye exam is UTD. She has cataracts and glaucoma.   Body mass index is 29.07 kg/(m^2). BP 149/78 mmHg  Pulse 72  Ht 5'  2" (1.575 m)  Wt 159 lb (72.122 kg)  BMI 29.07 kg/m2  LMP 03/18/1999  BP 149/78 mmHg  Pulse 72  Ht $R'5\' 2"'en$  (1.575 m)  Wt 159 lb (72.122 kg)  BMI 29.07 kg/m2  LMP 03/18/1999 General appearance: alert, cooperative and appears stated age Head: Normocephalic, without obvious abnormality, atraumatic Eyes: conj clear EOMI, PEERLA Ears: normal TM's and external ear canals both ears Nose: Nares normal. Septum midline. Mucosa normal. No drainage or sinus tenderness. Throat: lips, mucosa, and tongue normal; teeth and gums normal Neck: no adenopathy, no carotid bruit, no JVD, supple, symmetrical, trachea midline and thyroid not enlarged, symmetric, no tenderness/mass/nodules Back: symmetric, no curvature. ROM normal. No CVA tenderness. Lungs: clear to auscultation bilaterally Breasts: normal appearance, no masses or tenderness Heart: regular rate and rhythm, S1, S2 normal, no murmur, click, rub or gallop Abdomen: soft, non-tender; bowel sounds normal; no masses,  no organomegaly Pelvic: cervix normal in appearance, external genitalia normal, no adnexal masses or tenderness, no cervical motion tenderness, rectovaginal septum normal, uterus normal size, shape, and consistency and vagina normal without discharge Extremities: extremities normal, atraumatic, no cyanosis or edema Pulses: 2+ and symmetric Skin: Skin color, texture, turgor normal. No rashes or lesions Lymph nodes: Cervical, supraclavicular, and axillary nodes normal. Neurologic: Alert and oriented X 3, normal strength and tone. Normal symmetric reflexes. Normal coordination and gait     Assessment:     Annual Medicare Wellness Exam       Plan:     During the course of the visit the patient was educated and counseled about appropriate screening and preventive services including:    Pneumococcal vaccine  - Discussed need for Prevnar.   Iron def anemia - Check CBC, ferritin  Dm- foot exam due. F/u in 3 months.    Mammogram  is UTD   Antiphospholipid antibody syndrome-see Coumadin flow sheet for adjustments.  Diet review for nutrition referral? Yes ____  Not Indicated _X__   Patient Instructions (the written plan) was given to the patient.  Medicare Attestation I have personally reviewed: The patient's medical and social history Their use of alcohol, tobacco or illicit drugs Their current medications and supplements The patient's functional ability including  ADLs,fall risks, home safety risks, cognitive, and hearing and visual impairment Diet and physical activities Evidence for depression or mood disorders  The patient's weight, height, BMI, and visual acuity have been recorded in the chart.  I have made referrals, counseling, and provided education to the patient based on review of the above and I have provided the patient with a written personalized care plan for preventive services.     Ventura Leggitt, MD   04/25/2014

## 2014-04-25 NOTE — Patient Instructions (Signed)
Keep up a regular exercise program and make sure you are eating a healthy diet Try to eat 4 servings of dairy a day, or if you are lactose intolerant take a calcium with vitamin D daily.  Your vaccines are up to date.   

## 2014-04-26 LAB — CBC WITH DIFFERENTIAL/PLATELET
BASOS PCT: 0 % (ref 0–1)
Basophils Absolute: 0 10*3/uL (ref 0.0–0.1)
EOS ABS: 0 10*3/uL (ref 0.0–0.7)
Eosinophils Relative: 1 % (ref 0–5)
HCT: 25.3 % — ABNORMAL LOW (ref 36.0–46.0)
Hemoglobin: 8.3 g/dL — ABNORMAL LOW (ref 12.0–15.0)
Lymphocytes Relative: 26 % (ref 12–46)
Lymphs Abs: 1.2 10*3/uL (ref 0.7–4.0)
MCH: 27.2 pg (ref 26.0–34.0)
MCHC: 32.8 g/dL (ref 30.0–36.0)
MCV: 83 fL (ref 78.0–100.0)
MONO ABS: 0.4 10*3/uL (ref 0.1–1.0)
MPV: 9.1 fL (ref 8.6–12.4)
Monocytes Relative: 8 % (ref 3–12)
Neutro Abs: 3.1 10*3/uL (ref 1.7–7.7)
Neutrophils Relative %: 65 % (ref 43–77)
Platelets: 254 10*3/uL (ref 150–400)
RBC: 3.05 MIL/uL — ABNORMAL LOW (ref 3.87–5.11)
RDW: 16.4 % — ABNORMAL HIGH (ref 11.5–15.5)
WBC: 4.8 10*3/uL (ref 4.0–10.5)

## 2014-04-26 LAB — COMPLETE METABOLIC PANEL WITH GFR
ALK PHOS: 110 U/L (ref 39–117)
ALT: 17 U/L (ref 0–35)
AST: 15 U/L (ref 0–37)
Albumin: 3.7 g/dL (ref 3.5–5.2)
BILIRUBIN TOTAL: 0.3 mg/dL (ref 0.2–1.2)
BUN: 44 mg/dL — AB (ref 6–23)
CO2: 18 mEq/L — ABNORMAL LOW (ref 19–32)
Calcium: 8.7 mg/dL (ref 8.4–10.5)
Chloride: 110 mEq/L (ref 96–112)
Creat: 3.98 mg/dL — ABNORMAL HIGH (ref 0.50–1.10)
GFR, EST NON AFRICAN AMERICAN: 11 mL/min — AB
GFR, Est African American: 13 mL/min — ABNORMAL LOW
GLUCOSE: 71 mg/dL (ref 70–99)
Potassium: 4.4 mEq/L (ref 3.5–5.3)
Sodium: 141 mEq/L (ref 135–145)
Total Protein: 6.6 g/dL (ref 6.0–8.3)

## 2014-04-26 LAB — LIPID PANEL
CHOLESTEROL: 269 mg/dL — AB (ref 0–200)
HDL: 41 mg/dL — ABNORMAL LOW (ref >=46)
Total CHOL/HDL Ratio: 6.6 Ratio
Triglycerides: 464 mg/dL — ABNORMAL HIGH (ref <150)

## 2014-04-26 LAB — HEMOGLOBIN A1C
Hgb A1c MFr Bld: 5.5 % (ref <5.7)
Mean Plasma Glucose: 111 mg/dL (ref <117)

## 2014-04-26 LAB — FERRITIN: FERRITIN: 1097 ng/mL — AB (ref 10–291)

## 2014-04-26 LAB — TSH: TSH: 2.357 u[IU]/mL (ref 0.350–4.500)

## 2014-04-26 LAB — HEPATITIS C ANTIBODY: HCV Ab: NEGATIVE

## 2014-04-26 LAB — HIV ANTIBODY (ROUTINE TESTING W REFLEX): HIV 1&2 Ab, 4th Generation: NONREACTIVE

## 2014-05-01 ENCOUNTER — Other Ambulatory Visit: Payer: Self-pay | Admitting: *Deleted

## 2014-05-01 DIAGNOSIS — R768 Other specified abnormal immunological findings in serum: Secondary | ICD-10-CM | POA: Diagnosis not present

## 2014-05-01 DIAGNOSIS — E782 Mixed hyperlipidemia: Secondary | ICD-10-CM

## 2014-05-01 DIAGNOSIS — M329 Systemic lupus erythematosus, unspecified: Secondary | ICD-10-CM | POA: Diagnosis not present

## 2014-05-01 DIAGNOSIS — I7300 Raynaud's syndrome without gangrene: Secondary | ICD-10-CM | POA: Diagnosis not present

## 2014-05-01 MED ORDER — ATORVASTATIN CALCIUM 20 MG PO TABS: 20.0000 mg | ORAL_TABLET | Freq: Every day | ORAL | Status: DC

## 2014-05-01 NOTE — Addendum Note (Signed)
Addended by: [name] D on: 05/01/2014 05:28 PM   Modules accepted: Orders

## 2014-05-03 LAB — LDL CHOLESTEROL, DIRECT: LDL DIRECT: 89 mg/dL

## 2014-05-04 LAB — CYTOLOGY - PAP

## 2014-05-08 ENCOUNTER — Telehealth: Payer: Self-pay | Admitting: Physician Assistant

## 2014-05-08 DIAGNOSIS — H40003 Preglaucoma, unspecified, bilateral: Secondary | ICD-10-CM | POA: Diagnosis not present

## 2014-05-08 DIAGNOSIS — M329 Systemic lupus erythematosus, unspecified: Secondary | ICD-10-CM | POA: Diagnosis not present

## 2014-05-08 DIAGNOSIS — H25813 Combined forms of age-related cataract, bilateral: Secondary | ICD-10-CM | POA: Diagnosis not present

## 2014-05-08 DIAGNOSIS — H527 Unspecified disorder of refraction: Secondary | ICD-10-CM | POA: Diagnosis not present

## 2014-05-08 DIAGNOSIS — E119 Type 2 diabetes mellitus without complications: Secondary | ICD-10-CM | POA: Diagnosis not present

## 2014-05-08 NOTE — Telephone Encounter (Signed)
lvm informing pt of results.Joy Anderson Air

## 2014-05-08 NOTE — Telephone Encounter (Signed)
Call pt: LDL with just a slight increase. Looks good.

## 2014-06-07 DIAGNOSIS — H527 Unspecified disorder of refraction: Secondary | ICD-10-CM | POA: Diagnosis not present

## 2014-06-07 DIAGNOSIS — H25813 Combined forms of age-related cataract, bilateral: Secondary | ICD-10-CM | POA: Diagnosis not present

## 2014-06-07 DIAGNOSIS — H4011X1 Primary open-angle glaucoma, mild stage: Secondary | ICD-10-CM | POA: Diagnosis not present

## 2014-06-07 DIAGNOSIS — M329 Systemic lupus erythematosus, unspecified: Secondary | ICD-10-CM | POA: Diagnosis not present

## 2014-06-07 DIAGNOSIS — E119 Type 2 diabetes mellitus without complications: Secondary | ICD-10-CM | POA: Diagnosis not present

## 2014-06-22 DIAGNOSIS — N184 Chronic kidney disease, stage 4 (severe): Secondary | ICD-10-CM | POA: Diagnosis not present

## 2014-06-22 DIAGNOSIS — D649 Anemia, unspecified: Secondary | ICD-10-CM | POA: Diagnosis not present

## 2014-06-30 ENCOUNTER — Ambulatory Visit (INDEPENDENT_AMBULATORY_CARE_PROVIDER_SITE_OTHER): Payer: Commercial Managed Care - HMO | Admitting: Family Medicine

## 2014-06-30 VITALS — BP 131/78 | HR 77

## 2014-06-30 DIAGNOSIS — D6861 Antiphospholipid syndrome: Secondary | ICD-10-CM

## 2014-06-30 DIAGNOSIS — I82409 Acute embolism and thrombosis of unspecified deep veins of unspecified lower extremity: Secondary | ICD-10-CM

## 2014-06-30 LAB — POCT INR: INR: 2

## 2014-06-30 NOTE — Progress Notes (Signed)
Pt advised of dosage change, verbalized understanding. No further questions.

## 2014-07-06 DIAGNOSIS — N186 End stage renal disease: Secondary | ICD-10-CM | POA: Diagnosis not present

## 2014-07-06 DIAGNOSIS — H409 Unspecified glaucoma: Secondary | ICD-10-CM | POA: Insufficient documentation

## 2014-07-06 DIAGNOSIS — H4011X1 Primary open-angle glaucoma, mild stage: Secondary | ICD-10-CM | POA: Diagnosis not present

## 2014-07-06 DIAGNOSIS — E119 Type 2 diabetes mellitus without complications: Secondary | ICD-10-CM | POA: Diagnosis not present

## 2014-07-06 DIAGNOSIS — H2511 Age-related nuclear cataract, right eye: Secondary | ICD-10-CM | POA: Diagnosis not present

## 2014-07-06 DIAGNOSIS — H25811 Combined forms of age-related cataract, right eye: Secondary | ICD-10-CM | POA: Diagnosis not present

## 2014-07-06 DIAGNOSIS — Z87891 Personal history of nicotine dependence: Secondary | ICD-10-CM | POA: Diagnosis not present

## 2014-07-06 DIAGNOSIS — I120 Hypertensive chronic kidney disease with stage 5 chronic kidney disease or end stage renal disease: Secondary | ICD-10-CM | POA: Diagnosis not present

## 2014-08-01 DIAGNOSIS — N289 Disorder of kidney and ureter, unspecified: Secondary | ICD-10-CM | POA: Diagnosis not present

## 2014-08-01 DIAGNOSIS — E119 Type 2 diabetes mellitus without complications: Secondary | ICD-10-CM | POA: Diagnosis not present

## 2014-08-01 DIAGNOSIS — Z9109 Other allergy status, other than to drugs and biological substances: Secondary | ICD-10-CM | POA: Diagnosis not present

## 2014-08-01 DIAGNOSIS — M329 Systemic lupus erythematosus, unspecified: Secondary | ICD-10-CM | POA: Diagnosis not present

## 2014-08-01 DIAGNOSIS — H2512 Age-related nuclear cataract, left eye: Secondary | ICD-10-CM | POA: Diagnosis not present

## 2014-08-01 DIAGNOSIS — J302 Other seasonal allergic rhinitis: Secondary | ICD-10-CM | POA: Diagnosis not present

## 2014-08-01 DIAGNOSIS — H527 Unspecified disorder of refraction: Secondary | ICD-10-CM | POA: Diagnosis not present

## 2014-08-01 DIAGNOSIS — Z888 Allergy status to other drugs, medicaments and biological substances status: Secondary | ICD-10-CM | POA: Diagnosis not present

## 2014-08-01 DIAGNOSIS — H25812 Combined forms of age-related cataract, left eye: Secondary | ICD-10-CM | POA: Diagnosis not present

## 2014-08-01 DIAGNOSIS — H4011X1 Primary open-angle glaucoma, mild stage: Secondary | ICD-10-CM | POA: Diagnosis not present

## 2014-08-10 DIAGNOSIS — N184 Chronic kidney disease, stage 4 (severe): Secondary | ICD-10-CM | POA: Diagnosis not present

## 2014-10-11 DIAGNOSIS — N185 Chronic kidney disease, stage 5: Secondary | ICD-10-CM | POA: Diagnosis not present

## 2014-10-12 DIAGNOSIS — Z992 Dependence on renal dialysis: Secondary | ICD-10-CM | POA: Diagnosis not present

## 2014-10-12 DIAGNOSIS — Z01818 Encounter for other preprocedural examination: Secondary | ICD-10-CM | POA: Diagnosis not present

## 2014-10-12 DIAGNOSIS — Z86718 Personal history of other venous thrombosis and embolism: Secondary | ICD-10-CM | POA: Diagnosis not present

## 2014-10-12 DIAGNOSIS — R768 Other specified abnormal immunological findings in serum: Secondary | ICD-10-CM | POA: Diagnosis not present

## 2014-10-12 DIAGNOSIS — I120 Hypertensive chronic kidney disease with stage 5 chronic kidney disease or end stage renal disease: Secondary | ICD-10-CM | POA: Diagnosis not present

## 2014-10-12 DIAGNOSIS — M87852 Other osteonecrosis, left femur: Secondary | ICD-10-CM | POA: Diagnosis not present

## 2014-10-12 DIAGNOSIS — N19 Unspecified kidney failure: Secondary | ICD-10-CM | POA: Diagnosis not present

## 2014-10-12 DIAGNOSIS — M109 Gout, unspecified: Secondary | ICD-10-CM | POA: Diagnosis not present

## 2014-10-12 DIAGNOSIS — I7300 Raynaud's syndrome without gangrene: Secondary | ICD-10-CM | POA: Diagnosis not present

## 2014-10-12 DIAGNOSIS — Z452 Encounter for adjustment and management of vascular access device: Secondary | ICD-10-CM | POA: Diagnosis not present

## 2014-10-12 DIAGNOSIS — N186 End stage renal disease: Secondary | ICD-10-CM | POA: Diagnosis not present

## 2014-10-12 DIAGNOSIS — R760 Raised antibody titer: Secondary | ICD-10-CM | POA: Diagnosis not present

## 2014-10-12 DIAGNOSIS — Z7901 Long term (current) use of anticoagulants: Secondary | ICD-10-CM | POA: Diagnosis not present

## 2014-10-12 DIAGNOSIS — I10 Essential (primary) hypertension: Secondary | ICD-10-CM | POA: Diagnosis not present

## 2014-10-12 DIAGNOSIS — M329 Systemic lupus erythematosus, unspecified: Secondary | ICD-10-CM | POA: Diagnosis not present

## 2014-10-18 DIAGNOSIS — E1139 Type 2 diabetes mellitus with other diabetic ophthalmic complication: Secondary | ICD-10-CM | POA: Diagnosis not present

## 2014-10-18 DIAGNOSIS — N2581 Secondary hyperparathyroidism of renal origin: Secondary | ICD-10-CM | POA: Diagnosis not present

## 2014-10-18 DIAGNOSIS — I82409 Acute embolism and thrombosis of unspecified deep veins of unspecified lower extremity: Secondary | ICD-10-CM | POA: Diagnosis not present

## 2014-10-18 DIAGNOSIS — N186 End stage renal disease: Secondary | ICD-10-CM | POA: Diagnosis not present

## 2014-10-18 DIAGNOSIS — Z1159 Encounter for screening for other viral diseases: Secondary | ICD-10-CM | POA: Diagnosis not present

## 2014-10-18 DIAGNOSIS — D631 Anemia in chronic kidney disease: Secondary | ICD-10-CM | POA: Diagnosis not present

## 2014-10-18 DIAGNOSIS — Z23 Encounter for immunization: Secondary | ICD-10-CM | POA: Diagnosis not present

## 2014-10-18 DIAGNOSIS — B20 Human immunodeficiency virus [HIV] disease: Secondary | ICD-10-CM | POA: Diagnosis not present

## 2014-10-18 DIAGNOSIS — D509 Iron deficiency anemia, unspecified: Secondary | ICD-10-CM | POA: Diagnosis not present

## 2014-10-20 DIAGNOSIS — N2581 Secondary hyperparathyroidism of renal origin: Secondary | ICD-10-CM | POA: Diagnosis not present

## 2014-10-20 DIAGNOSIS — D631 Anemia in chronic kidney disease: Secondary | ICD-10-CM | POA: Diagnosis not present

## 2014-10-20 DIAGNOSIS — N186 End stage renal disease: Secondary | ICD-10-CM | POA: Diagnosis not present

## 2014-10-20 DIAGNOSIS — D509 Iron deficiency anemia, unspecified: Secondary | ICD-10-CM | POA: Diagnosis not present

## 2014-10-20 DIAGNOSIS — I82409 Acute embolism and thrombosis of unspecified deep veins of unspecified lower extremity: Secondary | ICD-10-CM | POA: Diagnosis not present

## 2014-10-20 DIAGNOSIS — Z23 Encounter for immunization: Secondary | ICD-10-CM | POA: Diagnosis not present

## 2014-10-21 DIAGNOSIS — N186 End stage renal disease: Secondary | ICD-10-CM | POA: Diagnosis not present

## 2014-10-21 DIAGNOSIS — Z86718 Personal history of other venous thrombosis and embolism: Secondary | ICD-10-CM | POA: Diagnosis not present

## 2014-10-21 DIAGNOSIS — M329 Systemic lupus erythematosus, unspecified: Secondary | ICD-10-CM | POA: Diagnosis not present

## 2014-10-21 DIAGNOSIS — Z7901 Long term (current) use of anticoagulants: Secondary | ICD-10-CM | POA: Diagnosis not present

## 2014-10-21 DIAGNOSIS — I120 Hypertensive chronic kidney disease with stage 5 chronic kidney disease or end stage renal disease: Secondary | ICD-10-CM | POA: Diagnosis not present

## 2014-10-21 DIAGNOSIS — Z992 Dependence on renal dialysis: Secondary | ICD-10-CM | POA: Diagnosis not present

## 2014-10-21 DIAGNOSIS — E669 Obesity, unspecified: Secondary | ICD-10-CM | POA: Diagnosis not present

## 2014-10-23 DIAGNOSIS — E669 Obesity, unspecified: Secondary | ICD-10-CM | POA: Diagnosis not present

## 2014-10-23 DIAGNOSIS — Z992 Dependence on renal dialysis: Secondary | ICD-10-CM | POA: Diagnosis not present

## 2014-10-23 DIAGNOSIS — N186 End stage renal disease: Secondary | ICD-10-CM | POA: Diagnosis not present

## 2014-10-23 DIAGNOSIS — I120 Hypertensive chronic kidney disease with stage 5 chronic kidney disease or end stage renal disease: Secondary | ICD-10-CM | POA: Diagnosis not present

## 2014-10-23 DIAGNOSIS — D509 Iron deficiency anemia, unspecified: Secondary | ICD-10-CM | POA: Diagnosis not present

## 2014-10-23 DIAGNOSIS — Z86718 Personal history of other venous thrombosis and embolism: Secondary | ICD-10-CM | POA: Diagnosis not present

## 2014-10-23 DIAGNOSIS — N2581 Secondary hyperparathyroidism of renal origin: Secondary | ICD-10-CM | POA: Diagnosis not present

## 2014-10-23 DIAGNOSIS — M329 Systemic lupus erythematosus, unspecified: Secondary | ICD-10-CM | POA: Diagnosis not present

## 2014-10-23 DIAGNOSIS — D631 Anemia in chronic kidney disease: Secondary | ICD-10-CM | POA: Diagnosis not present

## 2014-10-23 DIAGNOSIS — I82409 Acute embolism and thrombosis of unspecified deep veins of unspecified lower extremity: Secondary | ICD-10-CM | POA: Diagnosis not present

## 2014-10-23 DIAGNOSIS — Z23 Encounter for immunization: Secondary | ICD-10-CM | POA: Diagnosis not present

## 2014-10-23 DIAGNOSIS — Z7901 Long term (current) use of anticoagulants: Secondary | ICD-10-CM | POA: Diagnosis not present

## 2014-10-24 DIAGNOSIS — Z86718 Personal history of other venous thrombosis and embolism: Secondary | ICD-10-CM | POA: Diagnosis not present

## 2014-10-24 DIAGNOSIS — Z992 Dependence on renal dialysis: Secondary | ICD-10-CM | POA: Diagnosis not present

## 2014-10-24 DIAGNOSIS — N186 End stage renal disease: Secondary | ICD-10-CM | POA: Diagnosis not present

## 2014-10-24 DIAGNOSIS — M329 Systemic lupus erythematosus, unspecified: Secondary | ICD-10-CM | POA: Diagnosis not present

## 2014-10-24 DIAGNOSIS — I120 Hypertensive chronic kidney disease with stage 5 chronic kidney disease or end stage renal disease: Secondary | ICD-10-CM | POA: Diagnosis not present

## 2014-10-24 DIAGNOSIS — Z7901 Long term (current) use of anticoagulants: Secondary | ICD-10-CM | POA: Diagnosis not present

## 2014-10-24 DIAGNOSIS — E669 Obesity, unspecified: Secondary | ICD-10-CM | POA: Diagnosis not present

## 2014-10-25 DIAGNOSIS — D631 Anemia in chronic kidney disease: Secondary | ICD-10-CM | POA: Diagnosis not present

## 2014-10-25 DIAGNOSIS — N2581 Secondary hyperparathyroidism of renal origin: Secondary | ICD-10-CM | POA: Diagnosis not present

## 2014-10-25 DIAGNOSIS — N186 End stage renal disease: Secondary | ICD-10-CM | POA: Diagnosis not present

## 2014-10-25 DIAGNOSIS — Z23 Encounter for immunization: Secondary | ICD-10-CM | POA: Diagnosis not present

## 2014-10-25 DIAGNOSIS — D509 Iron deficiency anemia, unspecified: Secondary | ICD-10-CM | POA: Diagnosis not present

## 2014-10-25 DIAGNOSIS — I82409 Acute embolism and thrombosis of unspecified deep veins of unspecified lower extremity: Secondary | ICD-10-CM | POA: Diagnosis not present

## 2014-10-26 DIAGNOSIS — N186 End stage renal disease: Secondary | ICD-10-CM | POA: Diagnosis not present

## 2014-10-26 DIAGNOSIS — Z86718 Personal history of other venous thrombosis and embolism: Secondary | ICD-10-CM | POA: Diagnosis not present

## 2014-10-26 DIAGNOSIS — M329 Systemic lupus erythematosus, unspecified: Secondary | ICD-10-CM | POA: Diagnosis not present

## 2014-10-26 DIAGNOSIS — E669 Obesity, unspecified: Secondary | ICD-10-CM | POA: Diagnosis not present

## 2014-10-26 DIAGNOSIS — Z7901 Long term (current) use of anticoagulants: Secondary | ICD-10-CM | POA: Diagnosis not present

## 2014-10-26 DIAGNOSIS — Z992 Dependence on renal dialysis: Secondary | ICD-10-CM | POA: Diagnosis not present

## 2014-10-26 DIAGNOSIS — I120 Hypertensive chronic kidney disease with stage 5 chronic kidney disease or end stage renal disease: Secondary | ICD-10-CM | POA: Diagnosis not present

## 2014-10-27 DIAGNOSIS — D509 Iron deficiency anemia, unspecified: Secondary | ICD-10-CM | POA: Diagnosis not present

## 2014-10-27 DIAGNOSIS — N2581 Secondary hyperparathyroidism of renal origin: Secondary | ICD-10-CM | POA: Diagnosis not present

## 2014-10-27 DIAGNOSIS — N186 End stage renal disease: Secondary | ICD-10-CM | POA: Diagnosis not present

## 2014-10-27 DIAGNOSIS — Z23 Encounter for immunization: Secondary | ICD-10-CM | POA: Diagnosis not present

## 2014-10-27 DIAGNOSIS — D631 Anemia in chronic kidney disease: Secondary | ICD-10-CM | POA: Diagnosis not present

## 2014-10-30 ENCOUNTER — Other Ambulatory Visit: Payer: Self-pay | Admitting: Family Medicine

## 2014-10-30 DIAGNOSIS — D509 Iron deficiency anemia, unspecified: Secondary | ICD-10-CM | POA: Diagnosis not present

## 2014-10-30 DIAGNOSIS — D631 Anemia in chronic kidney disease: Secondary | ICD-10-CM | POA: Diagnosis not present

## 2014-10-30 DIAGNOSIS — N186 End stage renal disease: Secondary | ICD-10-CM | POA: Diagnosis not present

## 2014-10-30 DIAGNOSIS — I82409 Acute embolism and thrombosis of unspecified deep veins of unspecified lower extremity: Secondary | ICD-10-CM | POA: Diagnosis not present

## 2014-10-30 DIAGNOSIS — N2581 Secondary hyperparathyroidism of renal origin: Secondary | ICD-10-CM | POA: Diagnosis not present

## 2014-10-30 DIAGNOSIS — Z23 Encounter for immunization: Secondary | ICD-10-CM | POA: Diagnosis not present

## 2014-10-31 DIAGNOSIS — I7300 Raynaud's syndrome without gangrene: Secondary | ICD-10-CM | POA: Diagnosis not present

## 2014-10-31 DIAGNOSIS — I120 Hypertensive chronic kidney disease with stage 5 chronic kidney disease or end stage renal disease: Secondary | ICD-10-CM | POA: Diagnosis not present

## 2014-10-31 DIAGNOSIS — N186 End stage renal disease: Secondary | ICD-10-CM | POA: Diagnosis not present

## 2014-10-31 DIAGNOSIS — Z992 Dependence on renal dialysis: Secondary | ICD-10-CM | POA: Diagnosis not present

## 2014-10-31 DIAGNOSIS — E669 Obesity, unspecified: Secondary | ICD-10-CM | POA: Diagnosis not present

## 2014-10-31 DIAGNOSIS — M329 Systemic lupus erythematosus, unspecified: Secondary | ICD-10-CM | POA: Diagnosis not present

## 2014-10-31 DIAGNOSIS — R768 Other specified abnormal immunological findings in serum: Secondary | ICD-10-CM | POA: Diagnosis not present

## 2014-10-31 DIAGNOSIS — Z86718 Personal history of other venous thrombosis and embolism: Secondary | ICD-10-CM | POA: Diagnosis not present

## 2014-10-31 DIAGNOSIS — Z7901 Long term (current) use of anticoagulants: Secondary | ICD-10-CM | POA: Diagnosis not present

## 2014-11-01 DIAGNOSIS — D631 Anemia in chronic kidney disease: Secondary | ICD-10-CM | POA: Diagnosis not present

## 2014-11-01 DIAGNOSIS — N186 End stage renal disease: Secondary | ICD-10-CM | POA: Diagnosis not present

## 2014-11-01 DIAGNOSIS — Z23 Encounter for immunization: Secondary | ICD-10-CM | POA: Diagnosis not present

## 2014-11-01 DIAGNOSIS — N2581 Secondary hyperparathyroidism of renal origin: Secondary | ICD-10-CM | POA: Diagnosis not present

## 2014-11-01 DIAGNOSIS — D509 Iron deficiency anemia, unspecified: Secondary | ICD-10-CM | POA: Diagnosis not present

## 2014-11-02 DIAGNOSIS — Z7901 Long term (current) use of anticoagulants: Secondary | ICD-10-CM | POA: Diagnosis not present

## 2014-11-02 DIAGNOSIS — Z992 Dependence on renal dialysis: Secondary | ICD-10-CM | POA: Diagnosis not present

## 2014-11-02 DIAGNOSIS — N186 End stage renal disease: Secondary | ICD-10-CM | POA: Diagnosis not present

## 2014-11-02 DIAGNOSIS — E669 Obesity, unspecified: Secondary | ICD-10-CM | POA: Diagnosis not present

## 2014-11-02 DIAGNOSIS — M329 Systemic lupus erythematosus, unspecified: Secondary | ICD-10-CM | POA: Diagnosis not present

## 2014-11-02 DIAGNOSIS — I120 Hypertensive chronic kidney disease with stage 5 chronic kidney disease or end stage renal disease: Secondary | ICD-10-CM | POA: Diagnosis not present

## 2014-11-02 DIAGNOSIS — Z86718 Personal history of other venous thrombosis and embolism: Secondary | ICD-10-CM | POA: Diagnosis not present

## 2014-11-03 DIAGNOSIS — D631 Anemia in chronic kidney disease: Secondary | ICD-10-CM | POA: Diagnosis not present

## 2014-11-03 DIAGNOSIS — N2581 Secondary hyperparathyroidism of renal origin: Secondary | ICD-10-CM | POA: Diagnosis not present

## 2014-11-03 DIAGNOSIS — N186 End stage renal disease: Secondary | ICD-10-CM | POA: Diagnosis not present

## 2014-11-03 DIAGNOSIS — Z23 Encounter for immunization: Secondary | ICD-10-CM | POA: Diagnosis not present

## 2014-11-03 DIAGNOSIS — D509 Iron deficiency anemia, unspecified: Secondary | ICD-10-CM | POA: Diagnosis not present

## 2014-11-06 DIAGNOSIS — Z992 Dependence on renal dialysis: Secondary | ICD-10-CM | POA: Diagnosis not present

## 2014-11-06 DIAGNOSIS — D509 Iron deficiency anemia, unspecified: Secondary | ICD-10-CM | POA: Diagnosis not present

## 2014-11-06 DIAGNOSIS — I82409 Acute embolism and thrombosis of unspecified deep veins of unspecified lower extremity: Secondary | ICD-10-CM | POA: Diagnosis not present

## 2014-11-06 DIAGNOSIS — D631 Anemia in chronic kidney disease: Secondary | ICD-10-CM | POA: Diagnosis not present

## 2014-11-06 DIAGNOSIS — N2581 Secondary hyperparathyroidism of renal origin: Secondary | ICD-10-CM | POA: Diagnosis not present

## 2014-11-06 DIAGNOSIS — N186 End stage renal disease: Secondary | ICD-10-CM | POA: Diagnosis not present

## 2014-11-06 DIAGNOSIS — Z23 Encounter for immunization: Secondary | ICD-10-CM | POA: Diagnosis not present

## 2014-11-07 DIAGNOSIS — E669 Obesity, unspecified: Secondary | ICD-10-CM | POA: Diagnosis not present

## 2014-11-07 DIAGNOSIS — Z86718 Personal history of other venous thrombosis and embolism: Secondary | ICD-10-CM | POA: Diagnosis not present

## 2014-11-07 DIAGNOSIS — Z961 Presence of intraocular lens: Secondary | ICD-10-CM | POA: Diagnosis not present

## 2014-11-07 DIAGNOSIS — H401131 Primary open-angle glaucoma, bilateral, mild stage: Secondary | ICD-10-CM | POA: Diagnosis not present

## 2014-11-07 DIAGNOSIS — E119 Type 2 diabetes mellitus without complications: Secondary | ICD-10-CM | POA: Diagnosis not present

## 2014-11-07 DIAGNOSIS — H527 Unspecified disorder of refraction: Secondary | ICD-10-CM | POA: Diagnosis not present

## 2014-11-07 DIAGNOSIS — Z992 Dependence on renal dialysis: Secondary | ICD-10-CM | POA: Diagnosis not present

## 2014-11-07 DIAGNOSIS — I120 Hypertensive chronic kidney disease with stage 5 chronic kidney disease or end stage renal disease: Secondary | ICD-10-CM | POA: Diagnosis not present

## 2014-11-07 DIAGNOSIS — M329 Systemic lupus erythematosus, unspecified: Secondary | ICD-10-CM | POA: Diagnosis not present

## 2014-11-07 DIAGNOSIS — N186 End stage renal disease: Secondary | ICD-10-CM | POA: Diagnosis not present

## 2014-11-07 DIAGNOSIS — Z7901 Long term (current) use of anticoagulants: Secondary | ICD-10-CM | POA: Diagnosis not present

## 2014-11-08 DIAGNOSIS — D631 Anemia in chronic kidney disease: Secondary | ICD-10-CM | POA: Diagnosis not present

## 2014-11-08 DIAGNOSIS — N2581 Secondary hyperparathyroidism of renal origin: Secondary | ICD-10-CM | POA: Diagnosis not present

## 2014-11-08 DIAGNOSIS — E119 Type 2 diabetes mellitus without complications: Secondary | ICD-10-CM | POA: Diagnosis not present

## 2014-11-08 DIAGNOSIS — N186 End stage renal disease: Secondary | ICD-10-CM | POA: Diagnosis not present

## 2014-11-08 DIAGNOSIS — D509 Iron deficiency anemia, unspecified: Secondary | ICD-10-CM | POA: Diagnosis not present

## 2014-11-09 DIAGNOSIS — N186 End stage renal disease: Secondary | ICD-10-CM | POA: Diagnosis not present

## 2014-11-09 DIAGNOSIS — Z86718 Personal history of other venous thrombosis and embolism: Secondary | ICD-10-CM | POA: Diagnosis not present

## 2014-11-09 DIAGNOSIS — Z7901 Long term (current) use of anticoagulants: Secondary | ICD-10-CM | POA: Diagnosis not present

## 2014-11-09 DIAGNOSIS — M329 Systemic lupus erythematosus, unspecified: Secondary | ICD-10-CM | POA: Diagnosis not present

## 2014-11-09 DIAGNOSIS — Z992 Dependence on renal dialysis: Secondary | ICD-10-CM | POA: Diagnosis not present

## 2014-11-09 DIAGNOSIS — E669 Obesity, unspecified: Secondary | ICD-10-CM | POA: Diagnosis not present

## 2014-11-09 DIAGNOSIS — I120 Hypertensive chronic kidney disease with stage 5 chronic kidney disease or end stage renal disease: Secondary | ICD-10-CM | POA: Diagnosis not present

## 2014-11-10 DIAGNOSIS — D631 Anemia in chronic kidney disease: Secondary | ICD-10-CM | POA: Diagnosis not present

## 2014-11-10 DIAGNOSIS — N2581 Secondary hyperparathyroidism of renal origin: Secondary | ICD-10-CM | POA: Diagnosis not present

## 2014-11-10 DIAGNOSIS — N186 End stage renal disease: Secondary | ICD-10-CM | POA: Diagnosis not present

## 2014-11-10 DIAGNOSIS — D509 Iron deficiency anemia, unspecified: Secondary | ICD-10-CM | POA: Diagnosis not present

## 2014-11-13 DIAGNOSIS — I82409 Acute embolism and thrombosis of unspecified deep veins of unspecified lower extremity: Secondary | ICD-10-CM | POA: Diagnosis not present

## 2014-11-13 DIAGNOSIS — N2581 Secondary hyperparathyroidism of renal origin: Secondary | ICD-10-CM | POA: Diagnosis not present

## 2014-11-13 DIAGNOSIS — D509 Iron deficiency anemia, unspecified: Secondary | ICD-10-CM | POA: Diagnosis not present

## 2014-11-13 DIAGNOSIS — D631 Anemia in chronic kidney disease: Secondary | ICD-10-CM | POA: Diagnosis not present

## 2014-11-13 DIAGNOSIS — N186 End stage renal disease: Secondary | ICD-10-CM | POA: Diagnosis not present

## 2014-11-14 DIAGNOSIS — N186 End stage renal disease: Secondary | ICD-10-CM | POA: Diagnosis not present

## 2014-11-14 DIAGNOSIS — Z7901 Long term (current) use of anticoagulants: Secondary | ICD-10-CM | POA: Diagnosis not present

## 2014-11-14 DIAGNOSIS — E669 Obesity, unspecified: Secondary | ICD-10-CM | POA: Diagnosis not present

## 2014-11-14 DIAGNOSIS — Z992 Dependence on renal dialysis: Secondary | ICD-10-CM | POA: Diagnosis not present

## 2014-11-14 DIAGNOSIS — I120 Hypertensive chronic kidney disease with stage 5 chronic kidney disease or end stage renal disease: Secondary | ICD-10-CM | POA: Diagnosis not present

## 2014-11-14 DIAGNOSIS — M329 Systemic lupus erythematosus, unspecified: Secondary | ICD-10-CM | POA: Diagnosis not present

## 2014-11-14 DIAGNOSIS — Z86718 Personal history of other venous thrombosis and embolism: Secondary | ICD-10-CM | POA: Diagnosis not present

## 2014-11-15 DIAGNOSIS — N186 End stage renal disease: Secondary | ICD-10-CM | POA: Diagnosis not present

## 2014-11-15 DIAGNOSIS — D631 Anemia in chronic kidney disease: Secondary | ICD-10-CM | POA: Diagnosis not present

## 2014-11-15 DIAGNOSIS — D509 Iron deficiency anemia, unspecified: Secondary | ICD-10-CM | POA: Diagnosis not present

## 2014-11-15 DIAGNOSIS — N2581 Secondary hyperparathyroidism of renal origin: Secondary | ICD-10-CM | POA: Diagnosis not present

## 2014-11-16 DIAGNOSIS — I120 Hypertensive chronic kidney disease with stage 5 chronic kidney disease or end stage renal disease: Secondary | ICD-10-CM | POA: Diagnosis not present

## 2014-11-16 DIAGNOSIS — Z7901 Long term (current) use of anticoagulants: Secondary | ICD-10-CM | POA: Diagnosis not present

## 2014-11-16 DIAGNOSIS — Z992 Dependence on renal dialysis: Secondary | ICD-10-CM | POA: Diagnosis not present

## 2014-11-16 DIAGNOSIS — N186 End stage renal disease: Secondary | ICD-10-CM | POA: Diagnosis not present

## 2014-11-16 DIAGNOSIS — Z86718 Personal history of other venous thrombosis and embolism: Secondary | ICD-10-CM | POA: Diagnosis not present

## 2014-11-16 DIAGNOSIS — M329 Systemic lupus erythematosus, unspecified: Secondary | ICD-10-CM | POA: Diagnosis not present

## 2014-11-16 DIAGNOSIS — E669 Obesity, unspecified: Secondary | ICD-10-CM | POA: Diagnosis not present

## 2014-11-17 DIAGNOSIS — N186 End stage renal disease: Secondary | ICD-10-CM | POA: Diagnosis not present

## 2014-11-17 DIAGNOSIS — N19 Unspecified kidney failure: Secondary | ICD-10-CM | POA: Diagnosis not present

## 2014-11-17 DIAGNOSIS — D631 Anemia in chronic kidney disease: Secondary | ICD-10-CM | POA: Diagnosis not present

## 2014-11-17 DIAGNOSIS — N2581 Secondary hyperparathyroidism of renal origin: Secondary | ICD-10-CM | POA: Diagnosis not present

## 2014-11-17 DIAGNOSIS — D509 Iron deficiency anemia, unspecified: Secondary | ICD-10-CM | POA: Diagnosis not present

## 2014-11-20 DIAGNOSIS — I82409 Acute embolism and thrombosis of unspecified deep veins of unspecified lower extremity: Secondary | ICD-10-CM | POA: Diagnosis not present

## 2014-11-20 DIAGNOSIS — D631 Anemia in chronic kidney disease: Secondary | ICD-10-CM | POA: Diagnosis not present

## 2014-11-20 DIAGNOSIS — N2581 Secondary hyperparathyroidism of renal origin: Secondary | ICD-10-CM | POA: Diagnosis not present

## 2014-11-20 DIAGNOSIS — D509 Iron deficiency anemia, unspecified: Secondary | ICD-10-CM | POA: Diagnosis not present

## 2014-11-20 DIAGNOSIS — N186 End stage renal disease: Secondary | ICD-10-CM | POA: Diagnosis not present

## 2014-11-22 DIAGNOSIS — N186 End stage renal disease: Secondary | ICD-10-CM | POA: Diagnosis not present

## 2014-11-22 DIAGNOSIS — D509 Iron deficiency anemia, unspecified: Secondary | ICD-10-CM | POA: Diagnosis not present

## 2014-11-22 DIAGNOSIS — D631 Anemia in chronic kidney disease: Secondary | ICD-10-CM | POA: Diagnosis not present

## 2014-11-22 DIAGNOSIS — N2581 Secondary hyperparathyroidism of renal origin: Secondary | ICD-10-CM | POA: Diagnosis not present

## 2014-11-23 DIAGNOSIS — Z992 Dependence on renal dialysis: Secondary | ICD-10-CM | POA: Diagnosis not present

## 2014-11-23 DIAGNOSIS — I120 Hypertensive chronic kidney disease with stage 5 chronic kidney disease or end stage renal disease: Secondary | ICD-10-CM | POA: Diagnosis not present

## 2014-11-23 DIAGNOSIS — Z86718 Personal history of other venous thrombosis and embolism: Secondary | ICD-10-CM | POA: Diagnosis not present

## 2014-11-23 DIAGNOSIS — N186 End stage renal disease: Secondary | ICD-10-CM | POA: Diagnosis not present

## 2014-11-23 DIAGNOSIS — E669 Obesity, unspecified: Secondary | ICD-10-CM | POA: Diagnosis not present

## 2014-11-23 DIAGNOSIS — Z7901 Long term (current) use of anticoagulants: Secondary | ICD-10-CM | POA: Diagnosis not present

## 2014-11-23 DIAGNOSIS — M329 Systemic lupus erythematosus, unspecified: Secondary | ICD-10-CM | POA: Diagnosis not present

## 2014-11-24 DIAGNOSIS — N2581 Secondary hyperparathyroidism of renal origin: Secondary | ICD-10-CM | POA: Diagnosis not present

## 2014-11-24 DIAGNOSIS — D631 Anemia in chronic kidney disease: Secondary | ICD-10-CM | POA: Diagnosis not present

## 2014-11-24 DIAGNOSIS — D509 Iron deficiency anemia, unspecified: Secondary | ICD-10-CM | POA: Diagnosis not present

## 2014-11-24 DIAGNOSIS — N186 End stage renal disease: Secondary | ICD-10-CM | POA: Diagnosis not present

## 2014-11-26 DIAGNOSIS — N186 End stage renal disease: Secondary | ICD-10-CM | POA: Diagnosis not present

## 2014-11-26 DIAGNOSIS — D631 Anemia in chronic kidney disease: Secondary | ICD-10-CM | POA: Diagnosis not present

## 2014-11-26 DIAGNOSIS — N2581 Secondary hyperparathyroidism of renal origin: Secondary | ICD-10-CM | POA: Diagnosis not present

## 2014-11-26 DIAGNOSIS — I82409 Acute embolism and thrombosis of unspecified deep veins of unspecified lower extremity: Secondary | ICD-10-CM | POA: Diagnosis not present

## 2014-11-26 DIAGNOSIS — D509 Iron deficiency anemia, unspecified: Secondary | ICD-10-CM | POA: Diagnosis not present

## 2014-11-28 DIAGNOSIS — N186 End stage renal disease: Secondary | ICD-10-CM | POA: Diagnosis not present

## 2014-11-28 DIAGNOSIS — D631 Anemia in chronic kidney disease: Secondary | ICD-10-CM | POA: Diagnosis not present

## 2014-11-28 DIAGNOSIS — D509 Iron deficiency anemia, unspecified: Secondary | ICD-10-CM | POA: Diagnosis not present

## 2014-11-28 DIAGNOSIS — N2581 Secondary hyperparathyroidism of renal origin: Secondary | ICD-10-CM | POA: Diagnosis not present

## 2014-11-28 DIAGNOSIS — Z23 Encounter for immunization: Secondary | ICD-10-CM | POA: Diagnosis not present

## 2014-12-01 DIAGNOSIS — Z23 Encounter for immunization: Secondary | ICD-10-CM | POA: Diagnosis not present

## 2014-12-01 DIAGNOSIS — D509 Iron deficiency anemia, unspecified: Secondary | ICD-10-CM | POA: Diagnosis not present

## 2014-12-01 DIAGNOSIS — N2581 Secondary hyperparathyroidism of renal origin: Secondary | ICD-10-CM | POA: Diagnosis not present

## 2014-12-01 DIAGNOSIS — D631 Anemia in chronic kidney disease: Secondary | ICD-10-CM | POA: Diagnosis not present

## 2014-12-01 DIAGNOSIS — N186 End stage renal disease: Secondary | ICD-10-CM | POA: Diagnosis not present

## 2014-12-04 DIAGNOSIS — N186 End stage renal disease: Secondary | ICD-10-CM | POA: Diagnosis not present

## 2014-12-04 DIAGNOSIS — D509 Iron deficiency anemia, unspecified: Secondary | ICD-10-CM | POA: Diagnosis not present

## 2014-12-04 DIAGNOSIS — D631 Anemia in chronic kidney disease: Secondary | ICD-10-CM | POA: Diagnosis not present

## 2014-12-04 DIAGNOSIS — Z23 Encounter for immunization: Secondary | ICD-10-CM | POA: Diagnosis not present

## 2014-12-04 DIAGNOSIS — N2581 Secondary hyperparathyroidism of renal origin: Secondary | ICD-10-CM | POA: Diagnosis not present

## 2014-12-04 DIAGNOSIS — I82409 Acute embolism and thrombosis of unspecified deep veins of unspecified lower extremity: Secondary | ICD-10-CM | POA: Diagnosis not present

## 2014-12-06 DIAGNOSIS — D631 Anemia in chronic kidney disease: Secondary | ICD-10-CM | POA: Diagnosis not present

## 2014-12-06 DIAGNOSIS — N2581 Secondary hyperparathyroidism of renal origin: Secondary | ICD-10-CM | POA: Diagnosis not present

## 2014-12-06 DIAGNOSIS — N186 End stage renal disease: Secondary | ICD-10-CM | POA: Diagnosis not present

## 2014-12-06 DIAGNOSIS — Z992 Dependence on renal dialysis: Secondary | ICD-10-CM | POA: Diagnosis not present

## 2014-12-06 DIAGNOSIS — D509 Iron deficiency anemia, unspecified: Secondary | ICD-10-CM | POA: Diagnosis not present

## 2014-12-06 DIAGNOSIS — Z23 Encounter for immunization: Secondary | ICD-10-CM | POA: Diagnosis not present

## 2014-12-07 DIAGNOSIS — N186 End stage renal disease: Secondary | ICD-10-CM | POA: Diagnosis not present

## 2014-12-08 DIAGNOSIS — N186 End stage renal disease: Secondary | ICD-10-CM | POA: Diagnosis not present

## 2014-12-08 DIAGNOSIS — D509 Iron deficiency anemia, unspecified: Secondary | ICD-10-CM | POA: Diagnosis not present

## 2014-12-08 DIAGNOSIS — N2581 Secondary hyperparathyroidism of renal origin: Secondary | ICD-10-CM | POA: Diagnosis not present

## 2014-12-08 DIAGNOSIS — D631 Anemia in chronic kidney disease: Secondary | ICD-10-CM | POA: Diagnosis not present

## 2014-12-11 DIAGNOSIS — N186 End stage renal disease: Secondary | ICD-10-CM | POA: Diagnosis not present

## 2014-12-11 DIAGNOSIS — I82409 Acute embolism and thrombosis of unspecified deep veins of unspecified lower extremity: Secondary | ICD-10-CM | POA: Diagnosis not present

## 2014-12-11 DIAGNOSIS — D631 Anemia in chronic kidney disease: Secondary | ICD-10-CM | POA: Diagnosis not present

## 2014-12-11 DIAGNOSIS — D509 Iron deficiency anemia, unspecified: Secondary | ICD-10-CM | POA: Diagnosis not present

## 2014-12-11 DIAGNOSIS — N2581 Secondary hyperparathyroidism of renal origin: Secondary | ICD-10-CM | POA: Diagnosis not present

## 2014-12-13 DIAGNOSIS — E119 Type 2 diabetes mellitus without complications: Secondary | ICD-10-CM | POA: Diagnosis not present

## 2014-12-13 DIAGNOSIS — D631 Anemia in chronic kidney disease: Secondary | ICD-10-CM | POA: Diagnosis not present

## 2014-12-13 DIAGNOSIS — D509 Iron deficiency anemia, unspecified: Secondary | ICD-10-CM | POA: Diagnosis not present

## 2014-12-13 DIAGNOSIS — N186 End stage renal disease: Secondary | ICD-10-CM | POA: Diagnosis not present

## 2014-12-13 DIAGNOSIS — N2581 Secondary hyperparathyroidism of renal origin: Secondary | ICD-10-CM | POA: Diagnosis not present

## 2014-12-14 DIAGNOSIS — N186 End stage renal disease: Secondary | ICD-10-CM | POA: Diagnosis not present

## 2014-12-14 DIAGNOSIS — Z992 Dependence on renal dialysis: Secondary | ICD-10-CM | POA: Diagnosis not present

## 2014-12-14 DIAGNOSIS — M329 Systemic lupus erythematosus, unspecified: Secondary | ICD-10-CM | POA: Diagnosis not present

## 2014-12-14 DIAGNOSIS — Z86718 Personal history of other venous thrombosis and embolism: Secondary | ICD-10-CM | POA: Diagnosis not present

## 2014-12-14 DIAGNOSIS — I120 Hypertensive chronic kidney disease with stage 5 chronic kidney disease or end stage renal disease: Secondary | ICD-10-CM | POA: Diagnosis not present

## 2014-12-14 DIAGNOSIS — Z7901 Long term (current) use of anticoagulants: Secondary | ICD-10-CM | POA: Diagnosis not present

## 2014-12-14 DIAGNOSIS — E669 Obesity, unspecified: Secondary | ICD-10-CM | POA: Diagnosis not present

## 2014-12-15 DIAGNOSIS — D509 Iron deficiency anemia, unspecified: Secondary | ICD-10-CM | POA: Diagnosis not present

## 2014-12-15 DIAGNOSIS — N2581 Secondary hyperparathyroidism of renal origin: Secondary | ICD-10-CM | POA: Diagnosis not present

## 2014-12-15 DIAGNOSIS — D631 Anemia in chronic kidney disease: Secondary | ICD-10-CM | POA: Diagnosis not present

## 2014-12-15 DIAGNOSIS — N186 End stage renal disease: Secondary | ICD-10-CM | POA: Diagnosis not present

## 2014-12-17 DIAGNOSIS — N19 Unspecified kidney failure: Secondary | ICD-10-CM | POA: Diagnosis not present

## 2014-12-18 DIAGNOSIS — Z23 Encounter for immunization: Secondary | ICD-10-CM | POA: Diagnosis not present

## 2014-12-18 DIAGNOSIS — N186 End stage renal disease: Secondary | ICD-10-CM | POA: Diagnosis not present

## 2014-12-18 DIAGNOSIS — I82409 Acute embolism and thrombosis of unspecified deep veins of unspecified lower extremity: Secondary | ICD-10-CM | POA: Diagnosis not present

## 2014-12-18 DIAGNOSIS — N2581 Secondary hyperparathyroidism of renal origin: Secondary | ICD-10-CM | POA: Diagnosis not present

## 2014-12-19 DIAGNOSIS — N186 End stage renal disease: Secondary | ICD-10-CM | POA: Diagnosis not present

## 2014-12-19 DIAGNOSIS — E669 Obesity, unspecified: Secondary | ICD-10-CM | POA: Diagnosis not present

## 2014-12-19 DIAGNOSIS — I120 Hypertensive chronic kidney disease with stage 5 chronic kidney disease or end stage renal disease: Secondary | ICD-10-CM | POA: Diagnosis not present

## 2014-12-19 DIAGNOSIS — M329 Systemic lupus erythematosus, unspecified: Secondary | ICD-10-CM | POA: Diagnosis not present

## 2014-12-20 DIAGNOSIS — N186 End stage renal disease: Secondary | ICD-10-CM | POA: Diagnosis not present

## 2014-12-20 DIAGNOSIS — Z23 Encounter for immunization: Secondary | ICD-10-CM | POA: Diagnosis not present

## 2014-12-20 DIAGNOSIS — N2581 Secondary hyperparathyroidism of renal origin: Secondary | ICD-10-CM | POA: Diagnosis not present

## 2014-12-22 DIAGNOSIS — N2581 Secondary hyperparathyroidism of renal origin: Secondary | ICD-10-CM | POA: Diagnosis not present

## 2014-12-22 DIAGNOSIS — N186 End stage renal disease: Secondary | ICD-10-CM | POA: Diagnosis not present

## 2014-12-22 DIAGNOSIS — Z23 Encounter for immunization: Secondary | ICD-10-CM | POA: Diagnosis not present

## 2014-12-25 DIAGNOSIS — I82409 Acute embolism and thrombosis of unspecified deep veins of unspecified lower extremity: Secondary | ICD-10-CM | POA: Diagnosis not present

## 2014-12-25 DIAGNOSIS — N2581 Secondary hyperparathyroidism of renal origin: Secondary | ICD-10-CM | POA: Diagnosis not present

## 2014-12-25 DIAGNOSIS — N186 End stage renal disease: Secondary | ICD-10-CM | POA: Diagnosis not present

## 2014-12-25 DIAGNOSIS — Z23 Encounter for immunization: Secondary | ICD-10-CM | POA: Diagnosis not present

## 2014-12-27 DIAGNOSIS — N2581 Secondary hyperparathyroidism of renal origin: Secondary | ICD-10-CM | POA: Diagnosis not present

## 2014-12-27 DIAGNOSIS — N186 End stage renal disease: Secondary | ICD-10-CM | POA: Diagnosis not present

## 2014-12-29 DIAGNOSIS — N186 End stage renal disease: Secondary | ICD-10-CM | POA: Diagnosis not present

## 2014-12-29 DIAGNOSIS — N2581 Secondary hyperparathyroidism of renal origin: Secondary | ICD-10-CM | POA: Diagnosis not present

## 2015-01-01 DIAGNOSIS — I82409 Acute embolism and thrombosis of unspecified deep veins of unspecified lower extremity: Secondary | ICD-10-CM | POA: Diagnosis not present

## 2015-01-01 DIAGNOSIS — N186 End stage renal disease: Secondary | ICD-10-CM | POA: Diagnosis not present

## 2015-01-01 DIAGNOSIS — N2581 Secondary hyperparathyroidism of renal origin: Secondary | ICD-10-CM | POA: Diagnosis not present

## 2015-01-03 DIAGNOSIS — N186 End stage renal disease: Secondary | ICD-10-CM | POA: Diagnosis not present

## 2015-01-03 DIAGNOSIS — N2581 Secondary hyperparathyroidism of renal origin: Secondary | ICD-10-CM | POA: Diagnosis not present

## 2015-01-05 DIAGNOSIS — Z992 Dependence on renal dialysis: Secondary | ICD-10-CM | POA: Diagnosis not present

## 2015-01-05 DIAGNOSIS — Z888 Allergy status to other drugs, medicaments and biological substances status: Secondary | ICD-10-CM | POA: Diagnosis not present

## 2015-01-05 DIAGNOSIS — I120 Hypertensive chronic kidney disease with stage 5 chronic kidney disease or end stage renal disease: Secondary | ICD-10-CM | POA: Diagnosis not present

## 2015-01-05 DIAGNOSIS — Z79899 Other long term (current) drug therapy: Secondary | ICD-10-CM | POA: Diagnosis not present

## 2015-01-05 DIAGNOSIS — Z9109 Other allergy status, other than to drugs and biological substances: Secondary | ICD-10-CM | POA: Diagnosis not present

## 2015-01-05 DIAGNOSIS — Z7901 Long term (current) use of anticoagulants: Secondary | ICD-10-CM | POA: Diagnosis not present

## 2015-01-05 DIAGNOSIS — N186 End stage renal disease: Secondary | ICD-10-CM | POA: Diagnosis not present

## 2015-01-06 DIAGNOSIS — N186 End stage renal disease: Secondary | ICD-10-CM | POA: Diagnosis not present

## 2015-01-06 DIAGNOSIS — Z992 Dependence on renal dialysis: Secondary | ICD-10-CM | POA: Diagnosis not present

## 2015-01-06 DIAGNOSIS — N2581 Secondary hyperparathyroidism of renal origin: Secondary | ICD-10-CM | POA: Diagnosis not present

## 2015-01-07 HISTORY — PX: CATARACT EXTRACTION, BILATERAL: SHX1313

## 2015-01-08 DIAGNOSIS — N186 End stage renal disease: Secondary | ICD-10-CM | POA: Diagnosis not present

## 2015-01-08 DIAGNOSIS — N2581 Secondary hyperparathyroidism of renal origin: Secondary | ICD-10-CM | POA: Diagnosis not present

## 2015-01-08 DIAGNOSIS — D509 Iron deficiency anemia, unspecified: Secondary | ICD-10-CM | POA: Diagnosis not present

## 2015-01-08 DIAGNOSIS — I82409 Acute embolism and thrombosis of unspecified deep veins of unspecified lower extremity: Secondary | ICD-10-CM | POA: Diagnosis not present

## 2015-01-10 DIAGNOSIS — N2581 Secondary hyperparathyroidism of renal origin: Secondary | ICD-10-CM | POA: Diagnosis not present

## 2015-01-10 DIAGNOSIS — D509 Iron deficiency anemia, unspecified: Secondary | ICD-10-CM | POA: Diagnosis not present

## 2015-01-10 DIAGNOSIS — E119 Type 2 diabetes mellitus without complications: Secondary | ICD-10-CM | POA: Diagnosis not present

## 2015-01-10 DIAGNOSIS — N186 End stage renal disease: Secondary | ICD-10-CM | POA: Diagnosis not present

## 2015-01-12 DIAGNOSIS — N186 End stage renal disease: Secondary | ICD-10-CM | POA: Diagnosis not present

## 2015-01-12 DIAGNOSIS — D509 Iron deficiency anemia, unspecified: Secondary | ICD-10-CM | POA: Diagnosis not present

## 2015-01-12 DIAGNOSIS — N2581 Secondary hyperparathyroidism of renal origin: Secondary | ICD-10-CM | POA: Diagnosis not present

## 2015-01-15 DIAGNOSIS — D509 Iron deficiency anemia, unspecified: Secondary | ICD-10-CM | POA: Diagnosis not present

## 2015-01-15 DIAGNOSIS — I82409 Acute embolism and thrombosis of unspecified deep veins of unspecified lower extremity: Secondary | ICD-10-CM | POA: Diagnosis not present

## 2015-01-15 DIAGNOSIS — N2581 Secondary hyperparathyroidism of renal origin: Secondary | ICD-10-CM | POA: Diagnosis not present

## 2015-01-15 DIAGNOSIS — N186 End stage renal disease: Secondary | ICD-10-CM | POA: Diagnosis not present

## 2015-01-17 DIAGNOSIS — N19 Unspecified kidney failure: Secondary | ICD-10-CM | POA: Diagnosis not present

## 2015-01-17 DIAGNOSIS — N2581 Secondary hyperparathyroidism of renal origin: Secondary | ICD-10-CM | POA: Diagnosis not present

## 2015-01-17 DIAGNOSIS — N186 End stage renal disease: Secondary | ICD-10-CM | POA: Diagnosis not present

## 2015-01-19 DIAGNOSIS — N186 End stage renal disease: Secondary | ICD-10-CM | POA: Diagnosis not present

## 2015-01-19 DIAGNOSIS — N2581 Secondary hyperparathyroidism of renal origin: Secondary | ICD-10-CM | POA: Diagnosis not present

## 2015-01-22 DIAGNOSIS — N186 End stage renal disease: Secondary | ICD-10-CM | POA: Diagnosis not present

## 2015-01-22 DIAGNOSIS — I82409 Acute embolism and thrombosis of unspecified deep veins of unspecified lower extremity: Secondary | ICD-10-CM | POA: Diagnosis not present

## 2015-01-22 DIAGNOSIS — N2581 Secondary hyperparathyroidism of renal origin: Secondary | ICD-10-CM | POA: Diagnosis not present

## 2015-01-24 DIAGNOSIS — N2581 Secondary hyperparathyroidism of renal origin: Secondary | ICD-10-CM | POA: Diagnosis not present

## 2015-01-24 DIAGNOSIS — N186 End stage renal disease: Secondary | ICD-10-CM | POA: Diagnosis not present

## 2015-01-26 DIAGNOSIS — N2581 Secondary hyperparathyroidism of renal origin: Secondary | ICD-10-CM | POA: Diagnosis not present

## 2015-01-26 DIAGNOSIS — N186 End stage renal disease: Secondary | ICD-10-CM | POA: Diagnosis not present

## 2015-01-29 DIAGNOSIS — N2581 Secondary hyperparathyroidism of renal origin: Secondary | ICD-10-CM | POA: Diagnosis not present

## 2015-01-29 DIAGNOSIS — I82409 Acute embolism and thrombosis of unspecified deep veins of unspecified lower extremity: Secondary | ICD-10-CM | POA: Diagnosis not present

## 2015-01-29 DIAGNOSIS — D631 Anemia in chronic kidney disease: Secondary | ICD-10-CM | POA: Diagnosis not present

## 2015-01-29 DIAGNOSIS — N186 End stage renal disease: Secondary | ICD-10-CM | POA: Diagnosis not present

## 2015-01-31 DIAGNOSIS — N186 End stage renal disease: Secondary | ICD-10-CM | POA: Diagnosis not present

## 2015-01-31 DIAGNOSIS — D631 Anemia in chronic kidney disease: Secondary | ICD-10-CM | POA: Diagnosis not present

## 2015-01-31 DIAGNOSIS — N2581 Secondary hyperparathyroidism of renal origin: Secondary | ICD-10-CM | POA: Diagnosis not present

## 2015-02-02 DIAGNOSIS — N186 End stage renal disease: Secondary | ICD-10-CM | POA: Diagnosis not present

## 2015-02-02 DIAGNOSIS — D631 Anemia in chronic kidney disease: Secondary | ICD-10-CM | POA: Diagnosis not present

## 2015-02-02 DIAGNOSIS — N2581 Secondary hyperparathyroidism of renal origin: Secondary | ICD-10-CM | POA: Diagnosis not present

## 2015-02-05 DIAGNOSIS — D631 Anemia in chronic kidney disease: Secondary | ICD-10-CM | POA: Diagnosis not present

## 2015-02-05 DIAGNOSIS — N2581 Secondary hyperparathyroidism of renal origin: Secondary | ICD-10-CM | POA: Diagnosis not present

## 2015-02-05 DIAGNOSIS — I82409 Acute embolism and thrombosis of unspecified deep veins of unspecified lower extremity: Secondary | ICD-10-CM | POA: Diagnosis not present

## 2015-02-05 DIAGNOSIS — N186 End stage renal disease: Secondary | ICD-10-CM | POA: Diagnosis not present

## 2015-02-06 ENCOUNTER — Encounter: Payer: Self-pay | Admitting: Physician Assistant

## 2015-02-06 ENCOUNTER — Ambulatory Visit (INDEPENDENT_AMBULATORY_CARE_PROVIDER_SITE_OTHER): Payer: Commercial Managed Care - HMO | Admitting: Physician Assistant

## 2015-02-06 VITALS — BP 151/64 | HR 84 | Ht 62.0 in | Wt 150.0 lb

## 2015-02-06 DIAGNOSIS — N186 End stage renal disease: Secondary | ICD-10-CM | POA: Diagnosis not present

## 2015-02-06 DIAGNOSIS — Z992 Dependence on renal dialysis: Secondary | ICD-10-CM | POA: Diagnosis not present

## 2015-02-06 DIAGNOSIS — B029 Zoster without complications: Secondary | ICD-10-CM

## 2015-02-06 MED ORDER — VALACYCLOVIR HCL 1 G PO TABS: 1000.0000 mg | ORAL_TABLET | Freq: Three times a day (TID) | ORAL | Status: DC

## 2015-02-06 NOTE — Patient Instructions (Signed)

## 2015-02-06 NOTE — Progress Notes (Signed)
   Subjective:    Patient ID: Joy Anderson, female    DOB: 27-Jun-1950, 65 y.o.   MRN: [id number]  HPI Pt is a 65 yo female who presents to the clinic with a rash under right breast. Started Friday, 5 days ago. Itchy and seems to be spreading. No pain. Seems to be getting worse. On dialysis for nephrotic syndrome. Denies any recent illness.    Review of Systems  All other systems reviewed and are negative.      Objective:   Physical Exam  Constitutional: She appears well-developed and well-nourished.  Skin:     Psychiatric: She has a normal mood and affect. Her behavior is normal.          Assessment & Plan:  Shingles- viral culture done today to confirm. Treated with valtrex tid for 7 days. Pt on dialysis therefore no contraindication for anti-viral and renal disease. HO given. Discussed care. Follow up if pain occurs or with any concerns. Pt has not had zostavax.

## 2015-02-07 DIAGNOSIS — E119 Type 2 diabetes mellitus without complications: Secondary | ICD-10-CM | POA: Diagnosis not present

## 2015-02-07 DIAGNOSIS — D509 Iron deficiency anemia, unspecified: Secondary | ICD-10-CM | POA: Diagnosis not present

## 2015-02-07 DIAGNOSIS — D631 Anemia in chronic kidney disease: Secondary | ICD-10-CM | POA: Diagnosis not present

## 2015-02-07 DIAGNOSIS — N186 End stage renal disease: Secondary | ICD-10-CM | POA: Diagnosis not present

## 2015-02-07 DIAGNOSIS — N2581 Secondary hyperparathyroidism of renal origin: Secondary | ICD-10-CM | POA: Diagnosis not present

## 2015-02-09 DIAGNOSIS — N2581 Secondary hyperparathyroidism of renal origin: Secondary | ICD-10-CM | POA: Diagnosis not present

## 2015-02-09 DIAGNOSIS — D509 Iron deficiency anemia, unspecified: Secondary | ICD-10-CM | POA: Diagnosis not present

## 2015-02-09 DIAGNOSIS — N186 End stage renal disease: Secondary | ICD-10-CM | POA: Diagnosis not present

## 2015-02-09 DIAGNOSIS — D631 Anemia in chronic kidney disease: Secondary | ICD-10-CM | POA: Diagnosis not present

## 2015-02-11 LAB — RFX HSV/VARICELLA ZOSTER RAPID CULT

## 2015-02-11 LAB — REFLEX ADENOVIRUS CULTURE

## 2015-02-12 DIAGNOSIS — I82409 Acute embolism and thrombosis of unspecified deep veins of unspecified lower extremity: Secondary | ICD-10-CM | POA: Diagnosis not present

## 2015-02-12 DIAGNOSIS — N186 End stage renal disease: Secondary | ICD-10-CM | POA: Diagnosis not present

## 2015-02-12 DIAGNOSIS — D631 Anemia in chronic kidney disease: Secondary | ICD-10-CM | POA: Diagnosis not present

## 2015-02-12 DIAGNOSIS — N2581 Secondary hyperparathyroidism of renal origin: Secondary | ICD-10-CM | POA: Diagnosis not present

## 2015-02-12 DIAGNOSIS — D509 Iron deficiency anemia, unspecified: Secondary | ICD-10-CM | POA: Diagnosis not present

## 2015-02-14 DIAGNOSIS — N186 End stage renal disease: Secondary | ICD-10-CM | POA: Diagnosis not present

## 2015-02-14 DIAGNOSIS — D631 Anemia in chronic kidney disease: Secondary | ICD-10-CM | POA: Diagnosis not present

## 2015-02-14 DIAGNOSIS — N2581 Secondary hyperparathyroidism of renal origin: Secondary | ICD-10-CM | POA: Diagnosis not present

## 2015-02-14 DIAGNOSIS — D509 Iron deficiency anemia, unspecified: Secondary | ICD-10-CM | POA: Diagnosis not present

## 2015-02-14 LAB — VIRAL CULTURE VIRC

## 2015-02-14 LAB — CYTOMEGALOVIRUS CULTURE

## 2015-02-16 DIAGNOSIS — N186 End stage renal disease: Secondary | ICD-10-CM | POA: Diagnosis not present

## 2015-02-16 DIAGNOSIS — N2581 Secondary hyperparathyroidism of renal origin: Secondary | ICD-10-CM | POA: Diagnosis not present

## 2015-02-16 DIAGNOSIS — D631 Anemia in chronic kidney disease: Secondary | ICD-10-CM | POA: Diagnosis not present

## 2015-02-16 DIAGNOSIS — D509 Iron deficiency anemia, unspecified: Secondary | ICD-10-CM | POA: Diagnosis not present

## 2015-02-17 DIAGNOSIS — N19 Unspecified kidney failure: Secondary | ICD-10-CM | POA: Diagnosis not present

## 2015-02-19 DIAGNOSIS — N2581 Secondary hyperparathyroidism of renal origin: Secondary | ICD-10-CM | POA: Diagnosis not present

## 2015-02-19 DIAGNOSIS — D631 Anemia in chronic kidney disease: Secondary | ICD-10-CM | POA: Diagnosis not present

## 2015-02-19 DIAGNOSIS — N186 End stage renal disease: Secondary | ICD-10-CM | POA: Diagnosis not present

## 2015-02-19 DIAGNOSIS — I82409 Acute embolism and thrombosis of unspecified deep veins of unspecified lower extremity: Secondary | ICD-10-CM | POA: Diagnosis not present

## 2015-02-21 DIAGNOSIS — D631 Anemia in chronic kidney disease: Secondary | ICD-10-CM | POA: Diagnosis not present

## 2015-02-21 DIAGNOSIS — N2581 Secondary hyperparathyroidism of renal origin: Secondary | ICD-10-CM | POA: Diagnosis not present

## 2015-02-21 DIAGNOSIS — N186 End stage renal disease: Secondary | ICD-10-CM | POA: Diagnosis not present

## 2015-02-23 DIAGNOSIS — N186 End stage renal disease: Secondary | ICD-10-CM | POA: Diagnosis not present

## 2015-02-23 DIAGNOSIS — N2581 Secondary hyperparathyroidism of renal origin: Secondary | ICD-10-CM | POA: Diagnosis not present

## 2015-02-23 DIAGNOSIS — D631 Anemia in chronic kidney disease: Secondary | ICD-10-CM | POA: Diagnosis not present

## 2015-02-26 DIAGNOSIS — D631 Anemia in chronic kidney disease: Secondary | ICD-10-CM | POA: Diagnosis not present

## 2015-02-26 DIAGNOSIS — I82409 Acute embolism and thrombosis of unspecified deep veins of unspecified lower extremity: Secondary | ICD-10-CM | POA: Diagnosis not present

## 2015-02-26 DIAGNOSIS — N186 End stage renal disease: Secondary | ICD-10-CM | POA: Diagnosis not present

## 2015-02-26 DIAGNOSIS — N2581 Secondary hyperparathyroidism of renal origin: Secondary | ICD-10-CM | POA: Diagnosis not present

## 2015-02-28 DIAGNOSIS — D631 Anemia in chronic kidney disease: Secondary | ICD-10-CM | POA: Diagnosis not present

## 2015-02-28 DIAGNOSIS — N186 End stage renal disease: Secondary | ICD-10-CM | POA: Diagnosis not present

## 2015-02-28 DIAGNOSIS — N2581 Secondary hyperparathyroidism of renal origin: Secondary | ICD-10-CM | POA: Diagnosis not present

## 2015-03-02 DIAGNOSIS — D631 Anemia in chronic kidney disease: Secondary | ICD-10-CM | POA: Diagnosis not present

## 2015-03-02 DIAGNOSIS — N186 End stage renal disease: Secondary | ICD-10-CM | POA: Diagnosis not present

## 2015-03-02 DIAGNOSIS — N2581 Secondary hyperparathyroidism of renal origin: Secondary | ICD-10-CM | POA: Diagnosis not present

## 2015-03-05 DIAGNOSIS — I82409 Acute embolism and thrombosis of unspecified deep veins of unspecified lower extremity: Secondary | ICD-10-CM | POA: Diagnosis not present

## 2015-03-05 DIAGNOSIS — N186 End stage renal disease: Secondary | ICD-10-CM | POA: Diagnosis not present

## 2015-03-05 DIAGNOSIS — D631 Anemia in chronic kidney disease: Secondary | ICD-10-CM | POA: Diagnosis not present

## 2015-03-05 DIAGNOSIS — N2581 Secondary hyperparathyroidism of renal origin: Secondary | ICD-10-CM | POA: Diagnosis not present

## 2015-03-06 DIAGNOSIS — Z992 Dependence on renal dialysis: Secondary | ICD-10-CM | POA: Diagnosis not present

## 2015-03-06 DIAGNOSIS — N186 End stage renal disease: Secondary | ICD-10-CM | POA: Diagnosis not present

## 2015-03-07 DIAGNOSIS — D509 Iron deficiency anemia, unspecified: Secondary | ICD-10-CM | POA: Diagnosis not present

## 2015-03-07 DIAGNOSIS — E119 Type 2 diabetes mellitus without complications: Secondary | ICD-10-CM | POA: Diagnosis not present

## 2015-03-07 DIAGNOSIS — D631 Anemia in chronic kidney disease: Secondary | ICD-10-CM | POA: Diagnosis not present

## 2015-03-07 DIAGNOSIS — N2581 Secondary hyperparathyroidism of renal origin: Secondary | ICD-10-CM | POA: Diagnosis not present

## 2015-03-07 DIAGNOSIS — N186 End stage renal disease: Secondary | ICD-10-CM | POA: Diagnosis not present

## 2015-03-09 DIAGNOSIS — N2581 Secondary hyperparathyroidism of renal origin: Secondary | ICD-10-CM | POA: Diagnosis not present

## 2015-03-09 DIAGNOSIS — N186 End stage renal disease: Secondary | ICD-10-CM | POA: Diagnosis not present

## 2015-03-09 DIAGNOSIS — D509 Iron deficiency anemia, unspecified: Secondary | ICD-10-CM | POA: Diagnosis not present

## 2015-03-09 DIAGNOSIS — D631 Anemia in chronic kidney disease: Secondary | ICD-10-CM | POA: Diagnosis not present

## 2015-03-12 DIAGNOSIS — I82409 Acute embolism and thrombosis of unspecified deep veins of unspecified lower extremity: Secondary | ICD-10-CM | POA: Diagnosis not present

## 2015-03-12 DIAGNOSIS — N186 End stage renal disease: Secondary | ICD-10-CM | POA: Diagnosis not present

## 2015-03-12 DIAGNOSIS — N2581 Secondary hyperparathyroidism of renal origin: Secondary | ICD-10-CM | POA: Diagnosis not present

## 2015-03-12 DIAGNOSIS — D631 Anemia in chronic kidney disease: Secondary | ICD-10-CM | POA: Diagnosis not present

## 2015-03-12 DIAGNOSIS — D509 Iron deficiency anemia, unspecified: Secondary | ICD-10-CM | POA: Diagnosis not present

## 2015-03-14 DIAGNOSIS — D509 Iron deficiency anemia, unspecified: Secondary | ICD-10-CM | POA: Diagnosis not present

## 2015-03-14 DIAGNOSIS — N2581 Secondary hyperparathyroidism of renal origin: Secondary | ICD-10-CM | POA: Diagnosis not present

## 2015-03-14 DIAGNOSIS — N186 End stage renal disease: Secondary | ICD-10-CM | POA: Diagnosis not present

## 2015-03-14 DIAGNOSIS — D631 Anemia in chronic kidney disease: Secondary | ICD-10-CM | POA: Diagnosis not present

## 2015-03-16 ENCOUNTER — Other Ambulatory Visit: Payer: Self-pay | Admitting: *Deleted

## 2015-03-16 DIAGNOSIS — D631 Anemia in chronic kidney disease: Secondary | ICD-10-CM | POA: Diagnosis not present

## 2015-03-16 DIAGNOSIS — D509 Iron deficiency anemia, unspecified: Secondary | ICD-10-CM | POA: Diagnosis not present

## 2015-03-16 DIAGNOSIS — N2581 Secondary hyperparathyroidism of renal origin: Secondary | ICD-10-CM | POA: Diagnosis not present

## 2015-03-16 DIAGNOSIS — N186 End stage renal disease: Secondary | ICD-10-CM | POA: Diagnosis not present

## 2015-03-16 MED ORDER — METOPROLOL SUCCINATE ER 100 MG PO TB24: ORAL_TABLET | ORAL | Status: DC

## 2015-03-17 DIAGNOSIS — N19 Unspecified kidney failure: Secondary | ICD-10-CM | POA: Diagnosis not present

## 2015-03-19 DIAGNOSIS — I82409 Acute embolism and thrombosis of unspecified deep veins of unspecified lower extremity: Secondary | ICD-10-CM | POA: Diagnosis not present

## 2015-03-19 DIAGNOSIS — N2581 Secondary hyperparathyroidism of renal origin: Secondary | ICD-10-CM | POA: Diagnosis not present

## 2015-03-19 DIAGNOSIS — N186 End stage renal disease: Secondary | ICD-10-CM | POA: Diagnosis not present

## 2015-03-19 DIAGNOSIS — D631 Anemia in chronic kidney disease: Secondary | ICD-10-CM | POA: Diagnosis not present

## 2015-03-21 DIAGNOSIS — N2581 Secondary hyperparathyroidism of renal origin: Secondary | ICD-10-CM | POA: Diagnosis not present

## 2015-03-21 DIAGNOSIS — D631 Anemia in chronic kidney disease: Secondary | ICD-10-CM | POA: Diagnosis not present

## 2015-03-21 DIAGNOSIS — N186 End stage renal disease: Secondary | ICD-10-CM | POA: Diagnosis not present

## 2015-03-23 DIAGNOSIS — N186 End stage renal disease: Secondary | ICD-10-CM | POA: Diagnosis not present

## 2015-03-23 DIAGNOSIS — D631 Anemia in chronic kidney disease: Secondary | ICD-10-CM | POA: Diagnosis not present

## 2015-03-23 DIAGNOSIS — N2581 Secondary hyperparathyroidism of renal origin: Secondary | ICD-10-CM | POA: Diagnosis not present

## 2015-03-26 DIAGNOSIS — N186 End stage renal disease: Secondary | ICD-10-CM | POA: Diagnosis not present

## 2015-03-26 DIAGNOSIS — I82409 Acute embolism and thrombosis of unspecified deep veins of unspecified lower extremity: Secondary | ICD-10-CM | POA: Diagnosis not present

## 2015-03-26 DIAGNOSIS — N2581 Secondary hyperparathyroidism of renal origin: Secondary | ICD-10-CM | POA: Diagnosis not present

## 2015-03-26 DIAGNOSIS — D631 Anemia in chronic kidney disease: Secondary | ICD-10-CM | POA: Diagnosis not present

## 2015-03-27 ENCOUNTER — Encounter: Payer: Self-pay | Admitting: Family Medicine

## 2015-03-27 ENCOUNTER — Telehealth: Payer: Self-pay | Admitting: Family Medicine

## 2015-03-27 ENCOUNTER — Ambulatory Visit (INDEPENDENT_AMBULATORY_CARE_PROVIDER_SITE_OTHER): Payer: Commercial Managed Care - HMO | Admitting: Family Medicine

## 2015-03-27 VITALS — BP 139/63 | HR 73 | Wt 151.0 lb

## 2015-03-27 DIAGNOSIS — Z992 Dependence on renal dialysis: Secondary | ICD-10-CM | POA: Insufficient documentation

## 2015-03-27 DIAGNOSIS — E119 Type 2 diabetes mellitus without complications: Secondary | ICD-10-CM | POA: Diagnosis not present

## 2015-03-27 DIAGNOSIS — I10 Essential (primary) hypertension: Secondary | ICD-10-CM

## 2015-03-27 DIAGNOSIS — I82409 Acute embolism and thrombosis of unspecified deep veins of unspecified lower extremity: Secondary | ICD-10-CM

## 2015-03-27 DIAGNOSIS — M87052 Idiopathic aseptic necrosis of left femur: Secondary | ICD-10-CM

## 2015-03-27 DIAGNOSIS — M87051 Idiopathic aseptic necrosis of right femur: Secondary | ICD-10-CM

## 2015-03-27 DIAGNOSIS — D6861 Antiphospholipid syndrome: Secondary | ICD-10-CM

## 2015-03-27 DIAGNOSIS — N186 End stage renal disease: Secondary | ICD-10-CM

## 2015-03-27 LAB — POCT INR: INR: 2.5

## 2015-03-27 LAB — POCT GLYCOSYLATED HEMOGLOBIN (HGB A1C): Hemoglobin A1C: 5

## 2015-03-27 NOTE — Patient Instructions (Signed)
Please schedule next Medicare wellness exam after April 19.

## 2015-03-27 NOTE — Telephone Encounter (Signed)
Please call patient and let her know that the viral culture that she had done a month or so ago was positive for shingles. She had asked about it during our office visit but I forgot to review it with her.

## 2015-03-27 NOTE — Progress Notes (Signed)
   Subjective:    Patient ID: Joy Anderson, female    DOB: 25-Jul-1950, 65 y.o.   MRN: [id number]  HPI  ESRD - She started dialysis in October. She goes to the dialysis center 3 days per week. She also had both cataracts removed since I last saw her as well.  Hypertension- Pt denies chest pain, SOB, dizziness, or heart palpitations.  Taking meds as directed w/o problems.  Denies medication side effects.    Diabetes - no hypoglycemic events. No wounds or sores that are not healing well. No increased thirst or urination. Checking glucose at home. She is not currently on medication. She is just diet controlled.  She would also like to be referred to Dr. Juluis Rainier for her hip. She still having a lot of problems and pain. She's hoping that once things stabilize with dialysis that she might be a surgical candidate.  Hyperlipidemia-tolerate statin well without any side effects or problems. She does need a refill today.  Review of Systems     Objective:   Physical Exam  Constitutional: She is oriented to person, place, and time. She appears well-developed and well-nourished.  HENT:  Head: Normocephalic and atraumatic.  Neck: Neck supple. No thyromegaly present.  Cardiovascular: Normal rate, regular rhythm and normal heart sounds.   Pulmonary/Chest: Effort normal and breath sounds normal.  Lymphadenopathy:    She has no cervical adenopathy.  Neurological: She is alert and oriented to person, place, and time.  Skin: Skin is warm and dry.  Psychiatric: She has a normal mood and affect. Her behavior is normal.          Assessment & Plan:  HTN - Well controlled. Continue current regimen. Follow up in 6 mo.    DM- Well controlled. Continue current regimen. Follow up in 6 months.  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 Hyperlipidemia-due to recheck lipid panel. Continue  with statin daily.  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 Juluis Rainier.    Hyperlipidemia-due to recheck lipids. Continue Lipitor 20 mg  Antiphospholipid antibody syndrome with hypercoagulable state-Her Coumadin is now managed at the dialysis center. She was therapeutic today. We did go ahead and refill her medication.

## 2015-03-28 DIAGNOSIS — N2581 Secondary hyperparathyroidism of renal origin: Secondary | ICD-10-CM | POA: Diagnosis not present

## 2015-03-28 DIAGNOSIS — N186 End stage renal disease: Secondary | ICD-10-CM | POA: Diagnosis not present

## 2015-03-28 DIAGNOSIS — D631 Anemia in chronic kidney disease: Secondary | ICD-10-CM | POA: Diagnosis not present

## 2015-03-29 NOTE — Telephone Encounter (Signed)
Pt.notified

## 2015-03-30 DIAGNOSIS — N2581 Secondary hyperparathyroidism of renal origin: Secondary | ICD-10-CM | POA: Diagnosis not present

## 2015-03-30 DIAGNOSIS — N186 End stage renal disease: Secondary | ICD-10-CM | POA: Diagnosis not present

## 2015-03-30 DIAGNOSIS — D631 Anemia in chronic kidney disease: Secondary | ICD-10-CM | POA: Diagnosis not present

## 2015-04-02 DIAGNOSIS — N2581 Secondary hyperparathyroidism of renal origin: Secondary | ICD-10-CM | POA: Diagnosis not present

## 2015-04-02 DIAGNOSIS — N186 End stage renal disease: Secondary | ICD-10-CM | POA: Diagnosis not present

## 2015-04-02 DIAGNOSIS — I82409 Acute embolism and thrombosis of unspecified deep veins of unspecified lower extremity: Secondary | ICD-10-CM | POA: Diagnosis not present

## 2015-04-02 DIAGNOSIS — D631 Anemia in chronic kidney disease: Secondary | ICD-10-CM | POA: Diagnosis not present

## 2015-04-04 DIAGNOSIS — N186 End stage renal disease: Secondary | ICD-10-CM | POA: Diagnosis not present

## 2015-04-04 DIAGNOSIS — N2581 Secondary hyperparathyroidism of renal origin: Secondary | ICD-10-CM | POA: Diagnosis not present

## 2015-04-04 DIAGNOSIS — D631 Anemia in chronic kidney disease: Secondary | ICD-10-CM | POA: Diagnosis not present

## 2015-04-06 DIAGNOSIS — N186 End stage renal disease: Secondary | ICD-10-CM | POA: Diagnosis not present

## 2015-04-06 DIAGNOSIS — D631 Anemia in chronic kidney disease: Secondary | ICD-10-CM | POA: Diagnosis not present

## 2015-04-06 DIAGNOSIS — N2581 Secondary hyperparathyroidism of renal origin: Secondary | ICD-10-CM | POA: Diagnosis not present

## 2015-04-06 DIAGNOSIS — Z992 Dependence on renal dialysis: Secondary | ICD-10-CM | POA: Diagnosis not present

## 2015-04-09 DIAGNOSIS — D509 Iron deficiency anemia, unspecified: Secondary | ICD-10-CM | POA: Diagnosis not present

## 2015-04-09 DIAGNOSIS — N186 End stage renal disease: Secondary | ICD-10-CM | POA: Diagnosis not present

## 2015-04-09 DIAGNOSIS — I82409 Acute embolism and thrombosis of unspecified deep veins of unspecified lower extremity: Secondary | ICD-10-CM | POA: Diagnosis not present

## 2015-04-09 DIAGNOSIS — D631 Anemia in chronic kidney disease: Secondary | ICD-10-CM | POA: Diagnosis not present

## 2015-04-09 DIAGNOSIS — N2581 Secondary hyperparathyroidism of renal origin: Secondary | ICD-10-CM | POA: Diagnosis not present

## 2015-04-11 DIAGNOSIS — D631 Anemia in chronic kidney disease: Secondary | ICD-10-CM | POA: Diagnosis not present

## 2015-04-11 DIAGNOSIS — E119 Type 2 diabetes mellitus without complications: Secondary | ICD-10-CM | POA: Diagnosis not present

## 2015-04-11 DIAGNOSIS — N186 End stage renal disease: Secondary | ICD-10-CM | POA: Diagnosis not present

## 2015-04-11 DIAGNOSIS — D509 Iron deficiency anemia, unspecified: Secondary | ICD-10-CM | POA: Diagnosis not present

## 2015-04-11 DIAGNOSIS — N2581 Secondary hyperparathyroidism of renal origin: Secondary | ICD-10-CM | POA: Diagnosis not present

## 2015-04-13 DIAGNOSIS — N186 End stage renal disease: Secondary | ICD-10-CM | POA: Diagnosis not present

## 2015-04-13 DIAGNOSIS — D631 Anemia in chronic kidney disease: Secondary | ICD-10-CM | POA: Diagnosis not present

## 2015-04-13 DIAGNOSIS — N2581 Secondary hyperparathyroidism of renal origin: Secondary | ICD-10-CM | POA: Diagnosis not present

## 2015-04-13 DIAGNOSIS — D509 Iron deficiency anemia, unspecified: Secondary | ICD-10-CM | POA: Diagnosis not present

## 2015-04-16 DIAGNOSIS — D631 Anemia in chronic kidney disease: Secondary | ICD-10-CM | POA: Diagnosis not present

## 2015-04-16 DIAGNOSIS — I82409 Acute embolism and thrombosis of unspecified deep veins of unspecified lower extremity: Secondary | ICD-10-CM | POA: Diagnosis not present

## 2015-04-16 DIAGNOSIS — N186 End stage renal disease: Secondary | ICD-10-CM | POA: Diagnosis not present

## 2015-04-16 DIAGNOSIS — D509 Iron deficiency anemia, unspecified: Secondary | ICD-10-CM | POA: Diagnosis not present

## 2015-04-16 DIAGNOSIS — N2581 Secondary hyperparathyroidism of renal origin: Secondary | ICD-10-CM | POA: Diagnosis not present

## 2015-04-17 DIAGNOSIS — N19 Unspecified kidney failure: Secondary | ICD-10-CM | POA: Diagnosis not present

## 2015-04-18 DIAGNOSIS — D631 Anemia in chronic kidney disease: Secondary | ICD-10-CM | POA: Diagnosis not present

## 2015-04-18 DIAGNOSIS — N186 End stage renal disease: Secondary | ICD-10-CM | POA: Diagnosis not present

## 2015-04-18 DIAGNOSIS — N2581 Secondary hyperparathyroidism of renal origin: Secondary | ICD-10-CM | POA: Diagnosis not present

## 2015-04-20 DIAGNOSIS — N186 End stage renal disease: Secondary | ICD-10-CM | POA: Diagnosis not present

## 2015-04-20 DIAGNOSIS — N2581 Secondary hyperparathyroidism of renal origin: Secondary | ICD-10-CM | POA: Diagnosis not present

## 2015-04-20 DIAGNOSIS — D631 Anemia in chronic kidney disease: Secondary | ICD-10-CM | POA: Diagnosis not present

## 2015-04-23 DIAGNOSIS — N186 End stage renal disease: Secondary | ICD-10-CM | POA: Diagnosis not present

## 2015-04-23 DIAGNOSIS — N2581 Secondary hyperparathyroidism of renal origin: Secondary | ICD-10-CM | POA: Diagnosis not present

## 2015-04-23 DIAGNOSIS — D631 Anemia in chronic kidney disease: Secondary | ICD-10-CM | POA: Diagnosis not present

## 2015-04-23 DIAGNOSIS — I82409 Acute embolism and thrombosis of unspecified deep veins of unspecified lower extremity: Secondary | ICD-10-CM | POA: Diagnosis not present

## 2015-04-25 DIAGNOSIS — N186 End stage renal disease: Secondary | ICD-10-CM | POA: Diagnosis not present

## 2015-04-25 DIAGNOSIS — N2581 Secondary hyperparathyroidism of renal origin: Secondary | ICD-10-CM | POA: Diagnosis not present

## 2015-04-25 DIAGNOSIS — D631 Anemia in chronic kidney disease: Secondary | ICD-10-CM | POA: Diagnosis not present

## 2015-04-27 DIAGNOSIS — N2581 Secondary hyperparathyroidism of renal origin: Secondary | ICD-10-CM | POA: Diagnosis not present

## 2015-04-27 DIAGNOSIS — N186 End stage renal disease: Secondary | ICD-10-CM | POA: Diagnosis not present

## 2015-04-27 DIAGNOSIS — D631 Anemia in chronic kidney disease: Secondary | ICD-10-CM | POA: Diagnosis not present

## 2015-04-30 DIAGNOSIS — N2581 Secondary hyperparathyroidism of renal origin: Secondary | ICD-10-CM | POA: Diagnosis not present

## 2015-04-30 DIAGNOSIS — Z992 Dependence on renal dialysis: Secondary | ICD-10-CM | POA: Insufficient documentation

## 2015-04-30 DIAGNOSIS — D631 Anemia in chronic kidney disease: Secondary | ICD-10-CM | POA: Diagnosis not present

## 2015-04-30 DIAGNOSIS — N186 End stage renal disease: Secondary | ICD-10-CM | POA: Diagnosis not present

## 2015-05-02 DIAGNOSIS — I82409 Acute embolism and thrombosis of unspecified deep veins of unspecified lower extremity: Secondary | ICD-10-CM | POA: Diagnosis not present

## 2015-05-02 DIAGNOSIS — D631 Anemia in chronic kidney disease: Secondary | ICD-10-CM | POA: Diagnosis not present

## 2015-05-02 DIAGNOSIS — N2581 Secondary hyperparathyroidism of renal origin: Secondary | ICD-10-CM | POA: Diagnosis not present

## 2015-05-02 DIAGNOSIS — N186 End stage renal disease: Secondary | ICD-10-CM | POA: Diagnosis not present

## 2015-05-04 DIAGNOSIS — D631 Anemia in chronic kidney disease: Secondary | ICD-10-CM | POA: Diagnosis not present

## 2015-05-04 DIAGNOSIS — N2581 Secondary hyperparathyroidism of renal origin: Secondary | ICD-10-CM | POA: Diagnosis not present

## 2015-05-04 DIAGNOSIS — N186 End stage renal disease: Secondary | ICD-10-CM | POA: Diagnosis not present

## 2015-05-06 DIAGNOSIS — N186 End stage renal disease: Secondary | ICD-10-CM | POA: Diagnosis not present

## 2015-05-06 DIAGNOSIS — Z992 Dependence on renal dialysis: Secondary | ICD-10-CM | POA: Diagnosis not present

## 2015-05-07 DIAGNOSIS — Z23 Encounter for immunization: Secondary | ICD-10-CM | POA: Diagnosis not present

## 2015-05-07 DIAGNOSIS — D509 Iron deficiency anemia, unspecified: Secondary | ICD-10-CM | POA: Diagnosis not present

## 2015-05-07 DIAGNOSIS — I82409 Acute embolism and thrombosis of unspecified deep veins of unspecified lower extremity: Secondary | ICD-10-CM | POA: Diagnosis not present

## 2015-05-07 DIAGNOSIS — N186 End stage renal disease: Secondary | ICD-10-CM | POA: Diagnosis not present

## 2015-05-07 DIAGNOSIS — N2581 Secondary hyperparathyroidism of renal origin: Secondary | ICD-10-CM | POA: Diagnosis not present

## 2015-05-07 DIAGNOSIS — D631 Anemia in chronic kidney disease: Secondary | ICD-10-CM | POA: Diagnosis not present

## 2015-05-09 DIAGNOSIS — Z23 Encounter for immunization: Secondary | ICD-10-CM | POA: Diagnosis not present

## 2015-05-09 DIAGNOSIS — E119 Type 2 diabetes mellitus without complications: Secondary | ICD-10-CM | POA: Diagnosis not present

## 2015-05-09 DIAGNOSIS — D631 Anemia in chronic kidney disease: Secondary | ICD-10-CM | POA: Diagnosis not present

## 2015-05-09 DIAGNOSIS — D509 Iron deficiency anemia, unspecified: Secondary | ICD-10-CM | POA: Diagnosis not present

## 2015-05-09 DIAGNOSIS — N2581 Secondary hyperparathyroidism of renal origin: Secondary | ICD-10-CM | POA: Diagnosis not present

## 2015-05-09 DIAGNOSIS — N186 End stage renal disease: Secondary | ICD-10-CM | POA: Diagnosis not present

## 2015-05-11 DIAGNOSIS — N2581 Secondary hyperparathyroidism of renal origin: Secondary | ICD-10-CM | POA: Diagnosis not present

## 2015-05-11 DIAGNOSIS — N186 End stage renal disease: Secondary | ICD-10-CM | POA: Diagnosis not present

## 2015-05-11 DIAGNOSIS — Z23 Encounter for immunization: Secondary | ICD-10-CM | POA: Diagnosis not present

## 2015-05-11 DIAGNOSIS — D509 Iron deficiency anemia, unspecified: Secondary | ICD-10-CM | POA: Diagnosis not present

## 2015-05-11 DIAGNOSIS — D631 Anemia in chronic kidney disease: Secondary | ICD-10-CM | POA: Diagnosis not present

## 2015-05-14 DIAGNOSIS — Z23 Encounter for immunization: Secondary | ICD-10-CM | POA: Diagnosis not present

## 2015-05-14 DIAGNOSIS — N186 End stage renal disease: Secondary | ICD-10-CM | POA: Diagnosis not present

## 2015-05-14 DIAGNOSIS — D509 Iron deficiency anemia, unspecified: Secondary | ICD-10-CM | POA: Diagnosis not present

## 2015-05-14 DIAGNOSIS — I82409 Acute embolism and thrombosis of unspecified deep veins of unspecified lower extremity: Secondary | ICD-10-CM | POA: Diagnosis not present

## 2015-05-14 DIAGNOSIS — D631 Anemia in chronic kidney disease: Secondary | ICD-10-CM | POA: Diagnosis not present

## 2015-05-14 DIAGNOSIS — N2581 Secondary hyperparathyroidism of renal origin: Secondary | ICD-10-CM | POA: Diagnosis not present

## 2015-05-16 DIAGNOSIS — D509 Iron deficiency anemia, unspecified: Secondary | ICD-10-CM | POA: Diagnosis not present

## 2015-05-16 DIAGNOSIS — D631 Anemia in chronic kidney disease: Secondary | ICD-10-CM | POA: Diagnosis not present

## 2015-05-16 DIAGNOSIS — N186 End stage renal disease: Secondary | ICD-10-CM | POA: Diagnosis not present

## 2015-05-16 DIAGNOSIS — Z23 Encounter for immunization: Secondary | ICD-10-CM | POA: Diagnosis not present

## 2015-05-16 DIAGNOSIS — N2581 Secondary hyperparathyroidism of renal origin: Secondary | ICD-10-CM | POA: Diagnosis not present

## 2015-05-17 DIAGNOSIS — N19 Unspecified kidney failure: Secondary | ICD-10-CM | POA: Diagnosis not present

## 2015-05-18 DIAGNOSIS — D631 Anemia in chronic kidney disease: Secondary | ICD-10-CM | POA: Diagnosis not present

## 2015-05-18 DIAGNOSIS — N186 End stage renal disease: Secondary | ICD-10-CM | POA: Diagnosis not present

## 2015-05-18 DIAGNOSIS — N2581 Secondary hyperparathyroidism of renal origin: Secondary | ICD-10-CM | POA: Diagnosis not present

## 2015-05-21 DIAGNOSIS — N186 End stage renal disease: Secondary | ICD-10-CM | POA: Diagnosis not present

## 2015-05-21 DIAGNOSIS — D631 Anemia in chronic kidney disease: Secondary | ICD-10-CM | POA: Diagnosis not present

## 2015-05-21 DIAGNOSIS — N2581 Secondary hyperparathyroidism of renal origin: Secondary | ICD-10-CM | POA: Diagnosis not present

## 2015-05-21 DIAGNOSIS — I82409 Acute embolism and thrombosis of unspecified deep veins of unspecified lower extremity: Secondary | ICD-10-CM | POA: Diagnosis not present

## 2015-05-23 DIAGNOSIS — N2581 Secondary hyperparathyroidism of renal origin: Secondary | ICD-10-CM | POA: Diagnosis not present

## 2015-05-23 DIAGNOSIS — N186 End stage renal disease: Secondary | ICD-10-CM | POA: Diagnosis not present

## 2015-05-23 DIAGNOSIS — D631 Anemia in chronic kidney disease: Secondary | ICD-10-CM | POA: Diagnosis not present

## 2015-05-25 DIAGNOSIS — N2581 Secondary hyperparathyroidism of renal origin: Secondary | ICD-10-CM | POA: Diagnosis not present

## 2015-05-25 DIAGNOSIS — D631 Anemia in chronic kidney disease: Secondary | ICD-10-CM | POA: Diagnosis not present

## 2015-05-25 DIAGNOSIS — N186 End stage renal disease: Secondary | ICD-10-CM | POA: Diagnosis not present

## 2015-05-28 DIAGNOSIS — N186 End stage renal disease: Secondary | ICD-10-CM | POA: Diagnosis not present

## 2015-05-28 DIAGNOSIS — D631 Anemia in chronic kidney disease: Secondary | ICD-10-CM | POA: Diagnosis not present

## 2015-05-28 DIAGNOSIS — N2581 Secondary hyperparathyroidism of renal origin: Secondary | ICD-10-CM | POA: Diagnosis not present

## 2015-05-28 DIAGNOSIS — I82409 Acute embolism and thrombosis of unspecified deep veins of unspecified lower extremity: Secondary | ICD-10-CM | POA: Diagnosis not present

## 2015-05-30 DIAGNOSIS — N186 End stage renal disease: Secondary | ICD-10-CM | POA: Diagnosis not present

## 2015-05-30 DIAGNOSIS — N2581 Secondary hyperparathyroidism of renal origin: Secondary | ICD-10-CM | POA: Diagnosis not present

## 2015-05-30 DIAGNOSIS — D631 Anemia in chronic kidney disease: Secondary | ICD-10-CM | POA: Diagnosis not present

## 2015-06-01 DIAGNOSIS — N186 End stage renal disease: Secondary | ICD-10-CM | POA: Diagnosis not present

## 2015-06-01 DIAGNOSIS — D631 Anemia in chronic kidney disease: Secondary | ICD-10-CM | POA: Diagnosis not present

## 2015-06-01 DIAGNOSIS — N2581 Secondary hyperparathyroidism of renal origin: Secondary | ICD-10-CM | POA: Diagnosis not present

## 2015-06-04 DIAGNOSIS — N2581 Secondary hyperparathyroidism of renal origin: Secondary | ICD-10-CM | POA: Diagnosis not present

## 2015-06-04 DIAGNOSIS — I82409 Acute embolism and thrombosis of unspecified deep veins of unspecified lower extremity: Secondary | ICD-10-CM | POA: Diagnosis not present

## 2015-06-04 DIAGNOSIS — N186 End stage renal disease: Secondary | ICD-10-CM | POA: Diagnosis not present

## 2015-06-04 DIAGNOSIS — D631 Anemia in chronic kidney disease: Secondary | ICD-10-CM | POA: Diagnosis not present

## 2015-06-06 DIAGNOSIS — Z992 Dependence on renal dialysis: Secondary | ICD-10-CM | POA: Diagnosis not present

## 2015-06-06 DIAGNOSIS — N186 End stage renal disease: Secondary | ICD-10-CM | POA: Diagnosis not present

## 2015-06-06 DIAGNOSIS — N2581 Secondary hyperparathyroidism of renal origin: Secondary | ICD-10-CM | POA: Diagnosis not present

## 2015-06-06 DIAGNOSIS — D631 Anemia in chronic kidney disease: Secondary | ICD-10-CM | POA: Diagnosis not present

## 2015-06-08 DIAGNOSIS — D631 Anemia in chronic kidney disease: Secondary | ICD-10-CM | POA: Diagnosis not present

## 2015-06-08 DIAGNOSIS — N186 End stage renal disease: Secondary | ICD-10-CM | POA: Diagnosis not present

## 2015-06-08 DIAGNOSIS — D509 Iron deficiency anemia, unspecified: Secondary | ICD-10-CM | POA: Diagnosis not present

## 2015-06-08 DIAGNOSIS — N2581 Secondary hyperparathyroidism of renal origin: Secondary | ICD-10-CM | POA: Diagnosis not present

## 2015-06-11 DIAGNOSIS — N186 End stage renal disease: Secondary | ICD-10-CM | POA: Diagnosis not present

## 2015-06-11 DIAGNOSIS — N2581 Secondary hyperparathyroidism of renal origin: Secondary | ICD-10-CM | POA: Diagnosis not present

## 2015-06-11 DIAGNOSIS — I82409 Acute embolism and thrombosis of unspecified deep veins of unspecified lower extremity: Secondary | ICD-10-CM | POA: Diagnosis not present

## 2015-06-11 DIAGNOSIS — D509 Iron deficiency anemia, unspecified: Secondary | ICD-10-CM | POA: Diagnosis not present

## 2015-06-11 DIAGNOSIS — D631 Anemia in chronic kidney disease: Secondary | ICD-10-CM | POA: Diagnosis not present

## 2015-06-12 DIAGNOSIS — I7300 Raynaud's syndrome without gangrene: Secondary | ICD-10-CM | POA: Diagnosis not present

## 2015-06-12 DIAGNOSIS — N186 End stage renal disease: Secondary | ICD-10-CM | POA: Diagnosis not present

## 2015-06-12 DIAGNOSIS — M329 Systemic lupus erythematosus, unspecified: Secondary | ICD-10-CM | POA: Diagnosis not present

## 2015-06-12 DIAGNOSIS — R768 Other specified abnormal immunological findings in serum: Secondary | ICD-10-CM | POA: Diagnosis not present

## 2015-06-13 DIAGNOSIS — E119 Type 2 diabetes mellitus without complications: Secondary | ICD-10-CM | POA: Diagnosis not present

## 2015-06-13 DIAGNOSIS — D509 Iron deficiency anemia, unspecified: Secondary | ICD-10-CM | POA: Diagnosis not present

## 2015-06-13 DIAGNOSIS — N186 End stage renal disease: Secondary | ICD-10-CM | POA: Diagnosis not present

## 2015-06-13 DIAGNOSIS — D631 Anemia in chronic kidney disease: Secondary | ICD-10-CM | POA: Diagnosis not present

## 2015-06-13 DIAGNOSIS — N2581 Secondary hyperparathyroidism of renal origin: Secondary | ICD-10-CM | POA: Diagnosis not present

## 2015-06-15 DIAGNOSIS — N186 End stage renal disease: Secondary | ICD-10-CM | POA: Diagnosis not present

## 2015-06-15 DIAGNOSIS — D631 Anemia in chronic kidney disease: Secondary | ICD-10-CM | POA: Diagnosis not present

## 2015-06-15 DIAGNOSIS — D509 Iron deficiency anemia, unspecified: Secondary | ICD-10-CM | POA: Diagnosis not present

## 2015-06-15 DIAGNOSIS — N2581 Secondary hyperparathyroidism of renal origin: Secondary | ICD-10-CM | POA: Diagnosis not present

## 2015-06-17 DIAGNOSIS — N19 Unspecified kidney failure: Secondary | ICD-10-CM | POA: Diagnosis not present

## 2015-06-18 DIAGNOSIS — N2581 Secondary hyperparathyroidism of renal origin: Secondary | ICD-10-CM | POA: Diagnosis not present

## 2015-06-18 DIAGNOSIS — I82409 Acute embolism and thrombosis of unspecified deep veins of unspecified lower extremity: Secondary | ICD-10-CM | POA: Diagnosis not present

## 2015-06-18 DIAGNOSIS — D631 Anemia in chronic kidney disease: Secondary | ICD-10-CM | POA: Diagnosis not present

## 2015-06-18 DIAGNOSIS — N186 End stage renal disease: Secondary | ICD-10-CM | POA: Diagnosis not present

## 2015-06-18 DIAGNOSIS — D509 Iron deficiency anemia, unspecified: Secondary | ICD-10-CM | POA: Diagnosis not present

## 2015-06-20 DIAGNOSIS — D631 Anemia in chronic kidney disease: Secondary | ICD-10-CM | POA: Diagnosis not present

## 2015-06-20 DIAGNOSIS — N2581 Secondary hyperparathyroidism of renal origin: Secondary | ICD-10-CM | POA: Diagnosis not present

## 2015-06-20 DIAGNOSIS — N186 End stage renal disease: Secondary | ICD-10-CM | POA: Diagnosis not present

## 2015-06-20 DIAGNOSIS — D509 Iron deficiency anemia, unspecified: Secondary | ICD-10-CM | POA: Diagnosis not present

## 2015-06-22 DIAGNOSIS — N2581 Secondary hyperparathyroidism of renal origin: Secondary | ICD-10-CM | POA: Diagnosis not present

## 2015-06-22 DIAGNOSIS — D631 Anemia in chronic kidney disease: Secondary | ICD-10-CM | POA: Diagnosis not present

## 2015-06-22 DIAGNOSIS — D509 Iron deficiency anemia, unspecified: Secondary | ICD-10-CM | POA: Diagnosis not present

## 2015-06-22 DIAGNOSIS — N186 End stage renal disease: Secondary | ICD-10-CM | POA: Diagnosis not present

## 2015-06-25 DIAGNOSIS — N186 End stage renal disease: Secondary | ICD-10-CM | POA: Diagnosis not present

## 2015-06-25 DIAGNOSIS — D631 Anemia in chronic kidney disease: Secondary | ICD-10-CM | POA: Diagnosis not present

## 2015-06-25 DIAGNOSIS — D509 Iron deficiency anemia, unspecified: Secondary | ICD-10-CM | POA: Diagnosis not present

## 2015-06-25 DIAGNOSIS — N2581 Secondary hyperparathyroidism of renal origin: Secondary | ICD-10-CM | POA: Diagnosis not present

## 2015-06-25 DIAGNOSIS — I82409 Acute embolism and thrombosis of unspecified deep veins of unspecified lower extremity: Secondary | ICD-10-CM | POA: Diagnosis not present

## 2015-06-27 DIAGNOSIS — D631 Anemia in chronic kidney disease: Secondary | ICD-10-CM | POA: Diagnosis not present

## 2015-06-27 DIAGNOSIS — N2581 Secondary hyperparathyroidism of renal origin: Secondary | ICD-10-CM | POA: Diagnosis not present

## 2015-06-27 DIAGNOSIS — N186 End stage renal disease: Secondary | ICD-10-CM | POA: Diagnosis not present

## 2015-06-29 ENCOUNTER — Other Ambulatory Visit: Payer: Self-pay | Admitting: Family Medicine

## 2015-06-29 DIAGNOSIS — N2581 Secondary hyperparathyroidism of renal origin: Secondary | ICD-10-CM | POA: Diagnosis not present

## 2015-06-29 DIAGNOSIS — D631 Anemia in chronic kidney disease: Secondary | ICD-10-CM | POA: Diagnosis not present

## 2015-06-29 DIAGNOSIS — N186 End stage renal disease: Secondary | ICD-10-CM | POA: Diagnosis not present

## 2015-07-02 DIAGNOSIS — D631 Anemia in chronic kidney disease: Secondary | ICD-10-CM | POA: Diagnosis not present

## 2015-07-02 DIAGNOSIS — I82409 Acute embolism and thrombosis of unspecified deep veins of unspecified lower extremity: Secondary | ICD-10-CM | POA: Diagnosis not present

## 2015-07-02 DIAGNOSIS — N2581 Secondary hyperparathyroidism of renal origin: Secondary | ICD-10-CM | POA: Diagnosis not present

## 2015-07-02 DIAGNOSIS — N186 End stage renal disease: Secondary | ICD-10-CM | POA: Diagnosis not present

## 2015-07-04 DIAGNOSIS — N2581 Secondary hyperparathyroidism of renal origin: Secondary | ICD-10-CM | POA: Diagnosis not present

## 2015-07-04 DIAGNOSIS — N186 End stage renal disease: Secondary | ICD-10-CM | POA: Diagnosis not present

## 2015-07-04 DIAGNOSIS — D631 Anemia in chronic kidney disease: Secondary | ICD-10-CM | POA: Diagnosis not present

## 2015-07-06 DIAGNOSIS — D631 Anemia in chronic kidney disease: Secondary | ICD-10-CM | POA: Diagnosis not present

## 2015-07-06 DIAGNOSIS — N2581 Secondary hyperparathyroidism of renal origin: Secondary | ICD-10-CM | POA: Diagnosis not present

## 2015-07-06 DIAGNOSIS — Z992 Dependence on renal dialysis: Secondary | ICD-10-CM | POA: Diagnosis not present

## 2015-07-06 DIAGNOSIS — N186 End stage renal disease: Secondary | ICD-10-CM | POA: Diagnosis not present

## 2015-07-09 DIAGNOSIS — I82409 Acute embolism and thrombosis of unspecified deep veins of unspecified lower extremity: Secondary | ICD-10-CM | POA: Diagnosis not present

## 2015-07-09 DIAGNOSIS — D631 Anemia in chronic kidney disease: Secondary | ICD-10-CM | POA: Diagnosis not present

## 2015-07-09 DIAGNOSIS — D509 Iron deficiency anemia, unspecified: Secondary | ICD-10-CM | POA: Diagnosis not present

## 2015-07-09 DIAGNOSIS — N2581 Secondary hyperparathyroidism of renal origin: Secondary | ICD-10-CM | POA: Diagnosis not present

## 2015-07-09 DIAGNOSIS — N186 End stage renal disease: Secondary | ICD-10-CM | POA: Diagnosis not present

## 2015-07-11 DIAGNOSIS — N186 End stage renal disease: Secondary | ICD-10-CM | POA: Diagnosis not present

## 2015-07-11 DIAGNOSIS — N2581 Secondary hyperparathyroidism of renal origin: Secondary | ICD-10-CM | POA: Diagnosis not present

## 2015-07-11 DIAGNOSIS — D509 Iron deficiency anemia, unspecified: Secondary | ICD-10-CM | POA: Diagnosis not present

## 2015-07-11 DIAGNOSIS — E119 Type 2 diabetes mellitus without complications: Secondary | ICD-10-CM | POA: Diagnosis not present

## 2015-07-11 DIAGNOSIS — D631 Anemia in chronic kidney disease: Secondary | ICD-10-CM | POA: Diagnosis not present

## 2015-07-13 DIAGNOSIS — N2581 Secondary hyperparathyroidism of renal origin: Secondary | ICD-10-CM | POA: Diagnosis not present

## 2015-07-13 DIAGNOSIS — D631 Anemia in chronic kidney disease: Secondary | ICD-10-CM | POA: Diagnosis not present

## 2015-07-13 DIAGNOSIS — D509 Iron deficiency anemia, unspecified: Secondary | ICD-10-CM | POA: Diagnosis not present

## 2015-07-13 DIAGNOSIS — N186 End stage renal disease: Secondary | ICD-10-CM | POA: Diagnosis not present

## 2015-07-16 DIAGNOSIS — I82409 Acute embolism and thrombosis of unspecified deep veins of unspecified lower extremity: Secondary | ICD-10-CM | POA: Diagnosis not present

## 2015-07-16 DIAGNOSIS — N2581 Secondary hyperparathyroidism of renal origin: Secondary | ICD-10-CM | POA: Diagnosis not present

## 2015-07-16 DIAGNOSIS — N186 End stage renal disease: Secondary | ICD-10-CM | POA: Diagnosis not present

## 2015-07-16 DIAGNOSIS — D631 Anemia in chronic kidney disease: Secondary | ICD-10-CM | POA: Diagnosis not present

## 2015-07-16 DIAGNOSIS — D509 Iron deficiency anemia, unspecified: Secondary | ICD-10-CM | POA: Diagnosis not present

## 2015-07-17 DIAGNOSIS — N19 Unspecified kidney failure: Secondary | ICD-10-CM | POA: Diagnosis not present

## 2015-07-18 DIAGNOSIS — N186 End stage renal disease: Secondary | ICD-10-CM | POA: Diagnosis not present

## 2015-07-18 DIAGNOSIS — D631 Anemia in chronic kidney disease: Secondary | ICD-10-CM | POA: Diagnosis not present

## 2015-07-18 DIAGNOSIS — N2581 Secondary hyperparathyroidism of renal origin: Secondary | ICD-10-CM | POA: Diagnosis not present

## 2015-07-20 DIAGNOSIS — N186 End stage renal disease: Secondary | ICD-10-CM | POA: Diagnosis not present

## 2015-07-20 DIAGNOSIS — N2581 Secondary hyperparathyroidism of renal origin: Secondary | ICD-10-CM | POA: Diagnosis not present

## 2015-07-20 DIAGNOSIS — D631 Anemia in chronic kidney disease: Secondary | ICD-10-CM | POA: Diagnosis not present

## 2015-07-23 DIAGNOSIS — I82409 Acute embolism and thrombosis of unspecified deep veins of unspecified lower extremity: Secondary | ICD-10-CM | POA: Diagnosis not present

## 2015-07-23 DIAGNOSIS — N2581 Secondary hyperparathyroidism of renal origin: Secondary | ICD-10-CM | POA: Diagnosis not present

## 2015-07-23 DIAGNOSIS — D631 Anemia in chronic kidney disease: Secondary | ICD-10-CM | POA: Diagnosis not present

## 2015-07-23 DIAGNOSIS — N186 End stage renal disease: Secondary | ICD-10-CM | POA: Diagnosis not present

## 2015-07-25 DIAGNOSIS — D631 Anemia in chronic kidney disease: Secondary | ICD-10-CM | POA: Diagnosis not present

## 2015-07-25 DIAGNOSIS — N2581 Secondary hyperparathyroidism of renal origin: Secondary | ICD-10-CM | POA: Diagnosis not present

## 2015-07-25 DIAGNOSIS — N186 End stage renal disease: Secondary | ICD-10-CM | POA: Diagnosis not present

## 2015-07-27 DIAGNOSIS — D631 Anemia in chronic kidney disease: Secondary | ICD-10-CM | POA: Diagnosis not present

## 2015-07-27 DIAGNOSIS — N186 End stage renal disease: Secondary | ICD-10-CM | POA: Diagnosis not present

## 2015-07-27 DIAGNOSIS — N2581 Secondary hyperparathyroidism of renal origin: Secondary | ICD-10-CM | POA: Diagnosis not present

## 2015-07-30 DIAGNOSIS — N186 End stage renal disease: Secondary | ICD-10-CM | POA: Diagnosis not present

## 2015-07-30 DIAGNOSIS — N2581 Secondary hyperparathyroidism of renal origin: Secondary | ICD-10-CM | POA: Diagnosis not present

## 2015-07-30 DIAGNOSIS — D631 Anemia in chronic kidney disease: Secondary | ICD-10-CM | POA: Diagnosis not present

## 2015-07-30 DIAGNOSIS — I82409 Acute embolism and thrombosis of unspecified deep veins of unspecified lower extremity: Secondary | ICD-10-CM | POA: Diagnosis not present

## 2015-08-01 DIAGNOSIS — N2581 Secondary hyperparathyroidism of renal origin: Secondary | ICD-10-CM | POA: Diagnosis not present

## 2015-08-01 DIAGNOSIS — D631 Anemia in chronic kidney disease: Secondary | ICD-10-CM | POA: Diagnosis not present

## 2015-08-01 DIAGNOSIS — N186 End stage renal disease: Secondary | ICD-10-CM | POA: Diagnosis not present

## 2015-08-03 DIAGNOSIS — N186 End stage renal disease: Secondary | ICD-10-CM | POA: Diagnosis not present

## 2015-08-03 DIAGNOSIS — N2581 Secondary hyperparathyroidism of renal origin: Secondary | ICD-10-CM | POA: Diagnosis not present

## 2015-08-03 DIAGNOSIS — D631 Anemia in chronic kidney disease: Secondary | ICD-10-CM | POA: Diagnosis not present

## 2015-08-06 DIAGNOSIS — N2581 Secondary hyperparathyroidism of renal origin: Secondary | ICD-10-CM | POA: Diagnosis not present

## 2015-08-06 DIAGNOSIS — N186 End stage renal disease: Secondary | ICD-10-CM | POA: Diagnosis not present

## 2015-08-06 DIAGNOSIS — Z992 Dependence on renal dialysis: Secondary | ICD-10-CM | POA: Diagnosis not present

## 2015-08-06 DIAGNOSIS — I82409 Acute embolism and thrombosis of unspecified deep veins of unspecified lower extremity: Secondary | ICD-10-CM | POA: Diagnosis not present

## 2015-08-06 DIAGNOSIS — D631 Anemia in chronic kidney disease: Secondary | ICD-10-CM | POA: Diagnosis not present

## 2015-08-08 DIAGNOSIS — N2581 Secondary hyperparathyroidism of renal origin: Secondary | ICD-10-CM | POA: Diagnosis not present

## 2015-08-08 DIAGNOSIS — E119 Type 2 diabetes mellitus without complications: Secondary | ICD-10-CM | POA: Diagnosis not present

## 2015-08-08 DIAGNOSIS — D631 Anemia in chronic kidney disease: Secondary | ICD-10-CM | POA: Diagnosis not present

## 2015-08-08 DIAGNOSIS — N186 End stage renal disease: Secondary | ICD-10-CM | POA: Diagnosis not present

## 2015-08-08 DIAGNOSIS — D509 Iron deficiency anemia, unspecified: Secondary | ICD-10-CM | POA: Diagnosis not present

## 2015-08-10 DIAGNOSIS — D631 Anemia in chronic kidney disease: Secondary | ICD-10-CM | POA: Diagnosis not present

## 2015-08-10 DIAGNOSIS — N186 End stage renal disease: Secondary | ICD-10-CM | POA: Diagnosis not present

## 2015-08-10 DIAGNOSIS — D509 Iron deficiency anemia, unspecified: Secondary | ICD-10-CM | POA: Diagnosis not present

## 2015-08-10 DIAGNOSIS — N2581 Secondary hyperparathyroidism of renal origin: Secondary | ICD-10-CM | POA: Diagnosis not present

## 2015-08-13 DIAGNOSIS — D631 Anemia in chronic kidney disease: Secondary | ICD-10-CM | POA: Diagnosis not present

## 2015-08-13 DIAGNOSIS — D509 Iron deficiency anemia, unspecified: Secondary | ICD-10-CM | POA: Diagnosis not present

## 2015-08-13 DIAGNOSIS — N2581 Secondary hyperparathyroidism of renal origin: Secondary | ICD-10-CM | POA: Diagnosis not present

## 2015-08-13 DIAGNOSIS — N186 End stage renal disease: Secondary | ICD-10-CM | POA: Diagnosis not present

## 2015-08-13 DIAGNOSIS — I82409 Acute embolism and thrombosis of unspecified deep veins of unspecified lower extremity: Secondary | ICD-10-CM | POA: Diagnosis not present

## 2015-08-15 DIAGNOSIS — D631 Anemia in chronic kidney disease: Secondary | ICD-10-CM | POA: Diagnosis not present

## 2015-08-15 DIAGNOSIS — N186 End stage renal disease: Secondary | ICD-10-CM | POA: Diagnosis not present

## 2015-08-15 DIAGNOSIS — D509 Iron deficiency anemia, unspecified: Secondary | ICD-10-CM | POA: Diagnosis not present

## 2015-08-15 DIAGNOSIS — N2581 Secondary hyperparathyroidism of renal origin: Secondary | ICD-10-CM | POA: Diagnosis not present

## 2015-08-16 ENCOUNTER — Encounter: Payer: Self-pay | Admitting: Family Medicine

## 2015-08-16 ENCOUNTER — Ambulatory Visit (INDEPENDENT_AMBULATORY_CARE_PROVIDER_SITE_OTHER): Payer: Commercial Managed Care - HMO | Admitting: Family Medicine

## 2015-08-16 VITALS — BP 110/63 | HR 79 | Resp 15 | Wt 160.0 lb

## 2015-08-16 DIAGNOSIS — D6861 Antiphospholipid syndrome: Secondary | ICD-10-CM | POA: Diagnosis not present

## 2015-08-16 DIAGNOSIS — Z0000 Encounter for general adult medical examination without abnormal findings: Secondary | ICD-10-CM | POA: Diagnosis not present

## 2015-08-16 DIAGNOSIS — I10 Essential (primary) hypertension: Secondary | ICD-10-CM | POA: Diagnosis not present

## 2015-08-16 DIAGNOSIS — D509 Iron deficiency anemia, unspecified: Secondary | ICD-10-CM | POA: Diagnosis not present

## 2015-08-16 DIAGNOSIS — Z1231 Encounter for screening mammogram for malignant neoplasm of breast: Secondary | ICD-10-CM

## 2015-08-16 DIAGNOSIS — N951 Menopausal and female climacteric states: Secondary | ICD-10-CM

## 2015-08-16 DIAGNOSIS — M1612 Unilateral primary osteoarthritis, left hip: Secondary | ICD-10-CM

## 2015-08-16 DIAGNOSIS — Z13220 Encounter for screening for lipoid disorders: Secondary | ICD-10-CM

## 2015-08-16 LAB — POCT INR: INR: 1.8

## 2015-08-16 NOTE — Progress Notes (Signed)
Subjective:   Joy Anderson is a 65 y.o. female who presents for Medicare Annual (Subsequent) preventive examination.  Her nephrologist discontinued her metoprolol due to low blood pressures but she is still taking amlodipine.She is on dialysis on Monday, Wednesdays, and Fridays. She is still on Plaquenil for her antiphospholipid antibody syndrome and lupus. She wants to check her Coumadin today. She reports that she has had her tetanus vaccine done at the dialysis center. Will call to get a copy of that.  Review of Systems:  Comprehensive ROS is neg.         Objective:     Vitals: BP 110/63 (BP Location: Left Arm, Patient Position: Sitting, Cuff Size: Normal)   Pulse 79   Resp 15   Wt 160 lb (72.6 kg)   LMP 03/18/1999   SpO2 98%   BMI 29.26 kg/m   Body mass index is 29.26 kg/m.   Tobacco History  Smoking Status  . Never Smoker  Smokeless Tobacco  . Not on file     Counseling given: Not Answered   Past Medical History:  Diagnosis Date  . Antiphospholipid antibody with hypercoagulable state ([place]) 08/27/2010  . DVT (deep venous thrombosis) ([facility])    anti phospholipid antibody + -- Dr Lewanda Rife  . Hypertension   . OAB (overactive bladder)    off meds  . Post-menopausal   . Proteinuria    Dr Tressie Ellis  . Raynaud's disease    Past Surgical History:  Procedure Laterality Date  . AV FISTULA PLACEMENT  2016  . CATARACT EXTRACTION, BILATERAL  2017  . TOTAL ABDOMINAL HYSTERECTOMY  2001   for fibroids w/ 1 oophorectomy   Family History  Problem Relation Age of Onset  . Alcohol abuse Father   . Colon cancer Brother   . Prostate cancer Brother   . Cerebral palsy Brother   . Diabetes Other    History  Sexual Activity  . Sexual activity: Not on file    Outpatient Encounter Prescriptions as of 08/16/2015  Medication Sig  . amLODipine (NORVASC) 10 MG tablet Take 5 mg by mouth daily.   Marland Kitchen atorvastatin (LIPITOR) 20 MG tablet TAKE ONE TABLET BY MOUTH ONCE DAILY  .  CALCIUM PO Take by mouth.  . cholecalciferol (VITAMIN D) 1000 units tablet Take 1,000 Units by mouth daily.  Marland Kitchen HYDROcodone-acetaminophen (NORCO/VICODIN) 5-325 MG tablet take 1 tablet by mouth every 6 hours if needed for pain  . hydroxychloroquine (PLAQUENIL) 200 MG tablet Take 200 mg by mouth 2 (two) times daily.    . Lidocaine-Hydrocortisone Ace 2-2 % KIT Place 1 Applicatorful rectally 3 (three) times daily as needed.  . multivitamin (RENA-VIT) TABS tablet   . warfarin (COUMADIN) 1 MG tablet   . warfarin (COUMADIN) 5 MG tablet TAKE 1 TABLET BY MOUTH ON SUNDAY, WENESDAY, THURSDAY, FRIDAY, AND SATURDAY. THEN TAKE 1 AND 1/2 TABLET ON MONDAY AND TUESDAY  . [DISCONTINUED] metoprolol succinate (TOPROL-XL) 100 MG 24 hr tablet TAKE ONE TABLET BY MOUTH DAILY *ADDITIONAL REFILLS REQUIRE AN APPOINTMENT. PLEASE CALL THE OFFICE TO SCHEDULE AN APPOINTMENT*   No facility-administered encounter medications on file as of 08/16/2015.     Activities of Daily Living In your present state of health, do you have any difficulty performing the following activities: 08/16/2015  Hearing? N  Vision? N  Difficulty concentrating or making decisions? N  Walking or climbing stairs? Y  Dressing or bathing? Y  Doing errands, shopping? N  Some recent data might be  hidden    Patient Care Team: Hali Marry, MD as PCP - General (Family Medicine) Aretta Nip, MD as Referring Physician (Rheumatology) Katherina Right. Nicole Kindred, MD (Nephrology) Dr. Haynes Bast (Ophthalmology)    Assessment:     Exercise Activities and Dietary recommendations     Goals    None     Fall Risk Fall Risk  08/16/2015 04/25/2014 10/28/2012 10/14/2012  Falls in the past year? No No No No  Risk for fall due to : Impaired mobility;Impaired balance/gait - - -   Depression Screen PHQ 2/9 Scores 08/16/2015 08/16/2015 04/25/2014 10/28/2012  PHQ - 2 Score 0 0 0 0     Cognitive Testing No flowsheet data found.  6 CIT score of 2/28  (normal).   Immunization History  Administered Date(s) Administered  . Influenza Whole 12/12/2009  . Influenza,inj,Quad PF,36+ Mos 10/14/2012, 11/01/2013  . Influenza-Unspecified 10/25/2014  . Pneumococcal Conjugate-13 04/25/2014  . Pneumococcal Polysaccharide-23 12/12/2009  . Tdap 09/06/2004   Screening Tests Health Maintenance  Topic Date Due  . DEXA SCAN  03/31/2014  . TETANUS/TDAP  09/07/2014  . PNA vac Low Risk Adult (2 of 2 - PPSV23) 04/25/2015  . INFLUENZA VACCINE  08/07/2015  . HEMOGLOBIN A1C  09/27/2015  . OPHTHALMOLOGY EXAM  11/02/2015  . MAMMOGRAM  11/11/2015  . FOOT EXAM  03/26/2016  . COLONOSCOPY  04/15/2018  . ZOSTAVAX  Addressed  . Hepatitis C Screening  Completed      Plan:   Medicare Wellness Exam  During the course of the visit the patient was educated and counseled about the following appropriate screening and preventive services:   Vaccines to include Pneumoccal, Influenza, Hepatitis B, Td, Zostavax, HCV  Cardiovascular Disease  Bone density screening  Diabetes screening  Glaucoma screening  Mammography/PAP  Hypercoagulable state-INR 1.8. Increase warfarin to 6 mg daily. She has some 57 once at home. She also wanted to know if she would be a candidate for any of the newer anticoagulants. I suspect there has not been a lot of research done with those with antiphospholipid antibody syndrome but certainly she could speak with her rheumatologist about it. For now recommend Coumadin.  Hypertension-blood pressures have still been low especially after dialysis even with stopping metoprolol. Recommend go ahead and decrease amlodipine to half a tab daily.  Patient Instructions (the written plan) was given to the patient.   Judith Campillo, MD  08/16/2015

## 2015-08-16 NOTE — Patient Instructions (Signed)
Bone Densitometry Bone densitometry is an imaging test that uses a special X-ray to measure the amount of calcium and other minerals in your bones (bone density). This test is also known as a bone mineral density test or dual-energy X-ray absorptiometry (DXA). The test can measure bone density at your hip and your spine. It is similar to having a regular X-ray. You may have this test to:  Diagnose a condition that causes weak or thin bones (osteoporosis).  Predict your risk of a broken bone (fracture).  Determine how well osteoporosis treatment is working. LET [facility] CARE PROVIDER KNOW ABOUT:  Any allergies you have.  All medicines you are taking, including vitamins, herbs, eye drops, creams, and over-the-counter medicines.  Previous problems you or members of your family have had with the use of anesthetics.  Any blood disorders you have.  Previous surgeries you have had.  Medical conditions you have.  Possibility of pregnancy.  Any other medical test you had within the previous 14 days that used contrast material. RISKS AND COMPLICATIONS Generally, this is a safe procedure. However, problems can occur and may include the following:  This test exposes you to a very small amount of radiation.  The risks of radiation exposure may be greater to unborn children. BEFORE THE PROCEDURE  Do not take any calcium supplements for 24 hours before having the test. You can otherwise eat and drink what you usually do.  Take off all metal jewelry, eyeglasses, dental appliances, and any other metal objects. PROCEDURE  You may lie on an exam table. There will be an X-ray generator below you and an imaging device above you.  Other devices, such as boxes or braces, may be used to position your body properly for the scan.  You will need to lie still while the machine slowly scans your body.  The images will show up on a computer monitor. AFTER THE PROCEDURE You may need more testing  at a later time.   This information is not intended to replace advice given to you by your health care provider. Make sure you discuss any questions you have with your health care provider.   Document Released: 01/15/2004 Document Revised: 01/13/2014 Document Reviewed: 06/02/2013 Elsevier Interactive Patient Education 2016 [place] Maintenance, Female Adopting a healthy lifestyle and getting preventive care can go a long way to promote health and wellness. Talk with your health care provider about what schedule of regular examinations is right for you. This is a good chance for you to check in with your provider about disease prevention and staying healthy. In between checkups, there are plenty of things you can do on your own. Experts have done a lot of research about which lifestyle changes and preventive measures are most likely to keep you healthy. Ask your health care provider for more information. WEIGHT AND DIET  Eat a healthy diet  Be sure to include plenty of vegetables, fruits, low-fat dairy products, and lean protein.  Do not eat a lot of foods high in solid fats, added sugars, or salt.  Get regular exercise. This is one of the most important things you can do for your health.  Most adults should exercise for at least 150 minutes each week. The exercise should increase your heart rate and make you sweat (moderate-intensity exercise).  Most adults should also do strengthening exercises at least twice a week. This is in addition to the moderate-intensity exercise.  Maintain a healthy weight  Body mass index (  BMI) is a measurement that can be used to identify possible weight problems. It estimates body fat based on height and weight. Your health care provider can help determine your BMI and help you achieve or maintain a healthy weight.  For females 27 years of age and older:   A BMI below 18.5 is considered underweight.  A BMI of 18.5 to 24.9 is normal.  A BMI  of 25 to 29.9 is considered overweight.  A BMI of 30 and above is considered obese.  Watch levels of cholesterol and blood lipids  You should start having your blood tested for lipids and cholesterol at 66 years of age, then have this test every 5 years.  You may need to have your cholesterol levels checked more often if:  Your lipid or cholesterol levels are high.  You are older than 66 years of age.  You are at high risk for heart disease.  CANCER SCREENING   Lung Cancer  Lung cancer screening is recommended for adults 23-57 years old who are at high risk for lung cancer because of a history of smoking.  A yearly low-dose CT scan of the lungs is recommended for people who:  Currently smoke.  Have quit within the past 15 years.  Have at least a 30-pack-year history of smoking. A pack year is smoking an average of one pack of cigarettes a day for 1 year.  Yearly screening should continue until it has been 15 years since you quit.  Yearly screening should stop if you develop a health problem that would prevent you from having lung cancer treatment.  Breast Cancer  Practice breast self-awareness. This means understanding how your breasts normally appear and feel.  It also means doing regular breast self-exams. Let your health care provider know about any changes, no matter how small.  If you are in your 20s or 30s, you should have a clinical breast exam (CBE) by a health care provider every 1-3 years as part of a regular health exam.  If you are 43 or older, have a CBE every year. Also consider having a breast X-ray (mammogram) every year.  If you have a family history of breast cancer, talk to your health care provider about genetic screening.  If you are at high risk for breast cancer, talk to your health care provider about having an MRI and a mammogram every year.  Breast cancer gene (BRCA) assessment is recommended for women who have family members with  BRCA-related cancers. BRCA-related cancers include:  Breast.  Ovarian.  Tubal.  Peritoneal cancers.  Results of the assessment will determine the need for genetic counseling and BRCA1 and BRCA2 testing. Cervical Cancer Your health care provider may recommend that you be screened regularly for cancer of the pelvic organs (ovaries, uterus, and vagina). This screening involves a pelvic examination, including checking for microscopic changes to the surface of your cervix (Pap test). You may be encouraged to have this screening done every 3 years, beginning at age 48.  For women ages 48-65, health care providers may recommend pelvic exams and Pap testing every 3 years, or they may recommend the Pap and pelvic exam, combined with testing for human papilloma virus (HPV), every 5 years. Some types of HPV increase your risk of cervical cancer. Testing for HPV may also be done on women of any age with unclear Pap test results.  Other health care providers may not recommend any screening for nonpregnant women who are considered low risk for  pelvic cancer and who do not have symptoms. Ask your health care provider if a screening pelvic exam is right for you.  If you have had past treatment for cervical cancer or a condition that could lead to cancer, you need Pap tests and screening for cancer for at least 20 years after your treatment. If Pap tests have been discontinued, your risk factors (such as having a new sexual partner) need to be reassessed to determine if screening should resume. Some women have medical problems that increase the chance of getting cervical cancer. In these cases, your health care provider may recommend more frequent screening and Pap tests. Colorectal Cancer  This type of cancer can be detected and often prevented.  Routine colorectal cancer screening usually begins at 66 years of age and continues through 66 years of age.  Your health care provider may recommend screening at  an earlier age if you have risk factors for colon cancer.  Your health care provider may also recommend using home test kits to check for hidden blood in the stool.  A small camera at the end of a tube can be used to examine your colon directly (sigmoidoscopy or colonoscopy). This is done to check for the earliest forms of colorectal cancer.  Routine screening usually begins at age 52.  Direct examination of the colon should be repeated every 5-10 years through 66 years of age. However, you may need to be screened more often if early forms of precancerous polyps or small growths are found. Skin Cancer  Check your skin from head to toe regularly.  Tell your health care provider about any new moles or changes in moles, especially if there is a change in a mole's shape or color.  Also tell your health care provider if you have a mole that is larger than the size of a pencil eraser.  Always use sunscreen. Apply sunscreen liberally and repeatedly throughout the day.  Protect yourself by wearing long sleeves, pants, a wide-brimmed hat, and sunglasses whenever you are outside. HEART DISEASE, DIABETES, AND HIGH BLOOD PRESSURE   High blood pressure causes heart disease and increases the risk of stroke. High blood pressure is more likely to develop in:  People who have blood pressure in the high end of the normal range (130-139/85-89 mm Hg).  People who are overweight or obese.  People who are African American.  If you are 6-41 years of age, have your blood pressure checked every 3-5 years. If you are 20 years of age or older, have your blood pressure checked every year. You should have your blood pressure measured twice--once when you are at a hospital or clinic, and once when you are not at a hospital or clinic. Record the average of the two measurements. To check your blood pressure when you are not at a hospital or clinic, you can use:  An automated blood pressure machine at a  pharmacy.  A home blood pressure monitor.  If you are between 39 years and 11 years old, ask your health care provider if you should take aspirin to prevent strokes.  Have regular diabetes screenings. This involves taking a blood sample to check your fasting blood sugar level.  If you are at a normal weight and have a low risk for diabetes, have this test once every three years after 65 years of age.  If you are overweight and have a high risk for diabetes, consider being tested at a younger age or more often. PREVENTING  INFECTION  Hepatitis B  If you have a higher risk for hepatitis B, you should be screened for this virus. You are considered at high risk for hepatitis B if:  You were born in a country where hepatitis B is common. Ask your health care provider which countries are considered high risk.  Your parents were born in a high-risk country, and you have not been immunized against hepatitis B (hepatitis B vaccine).  You have HIV or AIDS.  You use needles to inject street drugs.  You live with someone who has hepatitis B.  You have had sex with someone who has hepatitis B.  You get hemodialysis treatment.  You take certain medicines for conditions, including cancer, organ transplantation, and autoimmune conditions. Hepatitis C  Blood testing is recommended for:  Everyone born from [birth year] through 1966.  Anyone with known risk factors for hepatitis C. Sexually transmitted infections (STIs)  You should be screened for sexually transmitted infections (STIs) including gonorrhea and chlamydia if:  You are sexually active and are younger than 66 years of age.  You are older than 66 years of age and your health care provider tells you that you are at risk for this type of infection.  Your sexual activity has changed since you were last screened and you are at an increased risk for chlamydia or gonorrhea. Ask your health care provider if you are at risk.  If you do not  have HIV, but are at risk, it may be recommended that you take a prescription medicine daily to prevent HIV infection. This is called pre-exposure prophylaxis (PrEP). You are considered at risk if:  You are sexually active and do not regularly use condoms or know the HIV status of your partner(s).  You take drugs by injection.  You are sexually active with a partner who has HIV. Talk with your health care provider about whether you are at high risk of being infected with HIV. If you choose to begin PrEP, you should first be tested for HIV. You should then be tested every 3 months for as long as you are taking PrEP.  PREGNANCY   If you are premenopausal and you may become pregnant, ask your health care provider about preconception counseling.  If you may become pregnant, take 400 to 800 micrograms (mcg) of folic acid every day.  If you want to prevent pregnancy, talk to your health care provider about birth control (contraception). OSTEOPOROSIS AND MENOPAUSE   Osteoporosis is a disease in which the bones lose minerals and strength with aging. This can result in serious bone fractures. Your risk for osteoporosis can be identified using a bone density scan.  If you are 15 years of age or older, or if you are at risk for osteoporosis and fractures, ask your health care provider if you should be screened.  Ask your health care provider whether you should take a calcium or vitamin D supplement to lower your risk for osteoporosis.  Menopause may have certain physical symptoms and risks.  Hormone replacement therapy may reduce some of these symptoms and risks. Talk to your health care provider about whether hormone replacement therapy is right for you.  HOME CARE INSTRUCTIONS   Schedule regular health, dental, and eye exams.  Stay current with your immunizations.   Do not use any tobacco products including cigarettes, chewing tobacco, or electronic cigarettes.  If you are pregnant, do not  drink alcohol.  If you are breastfeeding, limit how much and how often  you drink alcohol.  Limit alcohol intake to no more than 1 drink per day for nonpregnant women. One drink equals 12 ounces of beer, 5 ounces of wine, or 1 ounces of hard liquor.  Do not use street drugs.  Do not share needles.  Ask your health care provider for help if you need support or information about quitting drugs.  Tell your health care provider if you often feel depressed.  Tell your health care provider if you have ever been abused or do not feel safe at home.   This information is not intended to replace advice given to you by your health care provider. Make sure you discuss any questions you have with your health care provider.   Document Released: 07/08/2010 Document Revised: 01/13/2014 Document Reviewed: 11/24/2012 Elsevier Interactive Patient Education [employer].

## 2015-08-17 DIAGNOSIS — N19 Unspecified kidney failure: Secondary | ICD-10-CM | POA: Diagnosis not present

## 2015-08-17 DIAGNOSIS — N186 End stage renal disease: Secondary | ICD-10-CM | POA: Diagnosis not present

## 2015-08-17 DIAGNOSIS — N2581 Secondary hyperparathyroidism of renal origin: Secondary | ICD-10-CM | POA: Diagnosis not present

## 2015-08-17 DIAGNOSIS — D631 Anemia in chronic kidney disease: Secondary | ICD-10-CM | POA: Diagnosis not present

## 2015-08-17 NOTE — Progress Notes (Signed)
Pt's chart checked thru care everywhere and also called [facility] and there is no record of Tdap given. Advised pt that she will receive this at next OV.Joy Anderson

## 2015-08-20 DIAGNOSIS — I82409 Acute embolism and thrombosis of unspecified deep veins of unspecified lower extremity: Secondary | ICD-10-CM | POA: Diagnosis not present

## 2015-08-20 DIAGNOSIS — N186 End stage renal disease: Secondary | ICD-10-CM | POA: Diagnosis not present

## 2015-08-20 DIAGNOSIS — D631 Anemia in chronic kidney disease: Secondary | ICD-10-CM | POA: Diagnosis not present

## 2015-08-20 DIAGNOSIS — N2581 Secondary hyperparathyroidism of renal origin: Secondary | ICD-10-CM | POA: Diagnosis not present

## 2015-08-22 DIAGNOSIS — N186 End stage renal disease: Secondary | ICD-10-CM | POA: Diagnosis not present

## 2015-08-22 DIAGNOSIS — D631 Anemia in chronic kidney disease: Secondary | ICD-10-CM | POA: Diagnosis not present

## 2015-08-22 DIAGNOSIS — N2581 Secondary hyperparathyroidism of renal origin: Secondary | ICD-10-CM | POA: Diagnosis not present

## 2015-08-24 DIAGNOSIS — D631 Anemia in chronic kidney disease: Secondary | ICD-10-CM | POA: Diagnosis not present

## 2015-08-24 DIAGNOSIS — N2581 Secondary hyperparathyroidism of renal origin: Secondary | ICD-10-CM | POA: Diagnosis not present

## 2015-08-24 DIAGNOSIS — N186 End stage renal disease: Secondary | ICD-10-CM | POA: Diagnosis not present

## 2015-08-27 DIAGNOSIS — D631 Anemia in chronic kidney disease: Secondary | ICD-10-CM | POA: Diagnosis not present

## 2015-08-27 DIAGNOSIS — N186 End stage renal disease: Secondary | ICD-10-CM | POA: Diagnosis not present

## 2015-08-27 DIAGNOSIS — I82409 Acute embolism and thrombosis of unspecified deep veins of unspecified lower extremity: Secondary | ICD-10-CM | POA: Diagnosis not present

## 2015-08-27 DIAGNOSIS — N2581 Secondary hyperparathyroidism of renal origin: Secondary | ICD-10-CM | POA: Diagnosis not present

## 2015-08-29 DIAGNOSIS — D631 Anemia in chronic kidney disease: Secondary | ICD-10-CM | POA: Diagnosis not present

## 2015-08-29 DIAGNOSIS — N2581 Secondary hyperparathyroidism of renal origin: Secondary | ICD-10-CM | POA: Diagnosis not present

## 2015-08-29 DIAGNOSIS — N186 End stage renal disease: Secondary | ICD-10-CM | POA: Diagnosis not present

## 2015-08-31 DIAGNOSIS — D631 Anemia in chronic kidney disease: Secondary | ICD-10-CM | POA: Diagnosis not present

## 2015-08-31 DIAGNOSIS — N186 End stage renal disease: Secondary | ICD-10-CM | POA: Diagnosis not present

## 2015-08-31 DIAGNOSIS — N2581 Secondary hyperparathyroidism of renal origin: Secondary | ICD-10-CM | POA: Diagnosis not present

## 2015-09-03 DIAGNOSIS — N2581 Secondary hyperparathyroidism of renal origin: Secondary | ICD-10-CM | POA: Diagnosis not present

## 2015-09-03 DIAGNOSIS — I82409 Acute embolism and thrombosis of unspecified deep veins of unspecified lower extremity: Secondary | ICD-10-CM | POA: Diagnosis not present

## 2015-09-03 DIAGNOSIS — N186 End stage renal disease: Secondary | ICD-10-CM | POA: Diagnosis not present

## 2015-09-03 DIAGNOSIS — D631 Anemia in chronic kidney disease: Secondary | ICD-10-CM | POA: Diagnosis not present

## 2015-09-05 DIAGNOSIS — N2581 Secondary hyperparathyroidism of renal origin: Secondary | ICD-10-CM | POA: Diagnosis not present

## 2015-09-05 DIAGNOSIS — N186 End stage renal disease: Secondary | ICD-10-CM | POA: Diagnosis not present

## 2015-09-05 DIAGNOSIS — D631 Anemia in chronic kidney disease: Secondary | ICD-10-CM | POA: Diagnosis not present

## 2015-09-06 DIAGNOSIS — N186 End stage renal disease: Secondary | ICD-10-CM | POA: Diagnosis not present

## 2015-09-06 DIAGNOSIS — Z992 Dependence on renal dialysis: Secondary | ICD-10-CM | POA: Diagnosis not present

## 2015-09-07 DIAGNOSIS — N186 End stage renal disease: Secondary | ICD-10-CM | POA: Diagnosis not present

## 2015-09-07 DIAGNOSIS — D631 Anemia in chronic kidney disease: Secondary | ICD-10-CM | POA: Diagnosis not present

## 2015-09-07 DIAGNOSIS — D509 Iron deficiency anemia, unspecified: Secondary | ICD-10-CM | POA: Diagnosis not present

## 2015-09-07 DIAGNOSIS — N2581 Secondary hyperparathyroidism of renal origin: Secondary | ICD-10-CM | POA: Diagnosis not present

## 2015-09-10 DIAGNOSIS — N186 End stage renal disease: Secondary | ICD-10-CM | POA: Diagnosis not present

## 2015-09-10 DIAGNOSIS — D509 Iron deficiency anemia, unspecified: Secondary | ICD-10-CM | POA: Diagnosis not present

## 2015-09-10 DIAGNOSIS — D631 Anemia in chronic kidney disease: Secondary | ICD-10-CM | POA: Diagnosis not present

## 2015-09-10 DIAGNOSIS — N2581 Secondary hyperparathyroidism of renal origin: Secondary | ICD-10-CM | POA: Diagnosis not present

## 2015-09-10 DIAGNOSIS — I82409 Acute embolism and thrombosis of unspecified deep veins of unspecified lower extremity: Secondary | ICD-10-CM | POA: Diagnosis not present

## 2015-09-12 DIAGNOSIS — D509 Iron deficiency anemia, unspecified: Secondary | ICD-10-CM | POA: Diagnosis not present

## 2015-09-12 DIAGNOSIS — N2581 Secondary hyperparathyroidism of renal origin: Secondary | ICD-10-CM | POA: Diagnosis not present

## 2015-09-12 DIAGNOSIS — D631 Anemia in chronic kidney disease: Secondary | ICD-10-CM | POA: Diagnosis not present

## 2015-09-12 DIAGNOSIS — E119 Type 2 diabetes mellitus without complications: Secondary | ICD-10-CM | POA: Diagnosis not present

## 2015-09-12 DIAGNOSIS — N186 End stage renal disease: Secondary | ICD-10-CM | POA: Diagnosis not present

## 2015-09-14 DIAGNOSIS — N186 End stage renal disease: Secondary | ICD-10-CM | POA: Diagnosis not present

## 2015-09-14 DIAGNOSIS — D509 Iron deficiency anemia, unspecified: Secondary | ICD-10-CM | POA: Diagnosis not present

## 2015-09-14 DIAGNOSIS — D631 Anemia in chronic kidney disease: Secondary | ICD-10-CM | POA: Diagnosis not present

## 2015-09-14 DIAGNOSIS — N2581 Secondary hyperparathyroidism of renal origin: Secondary | ICD-10-CM | POA: Diagnosis not present

## 2015-09-17 DIAGNOSIS — N186 End stage renal disease: Secondary | ICD-10-CM | POA: Diagnosis not present

## 2015-09-17 DIAGNOSIS — N2581 Secondary hyperparathyroidism of renal origin: Secondary | ICD-10-CM | POA: Diagnosis not present

## 2015-09-17 DIAGNOSIS — D631 Anemia in chronic kidney disease: Secondary | ICD-10-CM | POA: Diagnosis not present

## 2015-09-17 DIAGNOSIS — I82409 Acute embolism and thrombosis of unspecified deep veins of unspecified lower extremity: Secondary | ICD-10-CM | POA: Diagnosis not present

## 2015-09-19 ENCOUNTER — Ambulatory Visit (INDEPENDENT_AMBULATORY_CARE_PROVIDER_SITE_OTHER): Payer: Commercial Managed Care - HMO

## 2015-09-19 DIAGNOSIS — M85852 Other specified disorders of bone density and structure, left thigh: Secondary | ICD-10-CM | POA: Diagnosis not present

## 2015-09-19 DIAGNOSIS — D631 Anemia in chronic kidney disease: Secondary | ICD-10-CM | POA: Diagnosis not present

## 2015-09-19 DIAGNOSIS — M8588 Other specified disorders of bone density and structure, other site: Secondary | ICD-10-CM

## 2015-09-19 DIAGNOSIS — N186 End stage renal disease: Secondary | ICD-10-CM | POA: Diagnosis not present

## 2015-09-19 DIAGNOSIS — Z1231 Encounter for screening mammogram for malignant neoplasm of breast: Secondary | ICD-10-CM

## 2015-09-19 DIAGNOSIS — M85862 Other specified disorders of bone density and structure, left lower leg: Secondary | ICD-10-CM | POA: Diagnosis not present

## 2015-09-19 DIAGNOSIS — N951 Menopausal and female climacteric states: Secondary | ICD-10-CM

## 2015-09-19 DIAGNOSIS — N2581 Secondary hyperparathyroidism of renal origin: Secondary | ICD-10-CM | POA: Diagnosis not present

## 2015-09-21 DIAGNOSIS — N186 End stage renal disease: Secondary | ICD-10-CM | POA: Diagnosis not present

## 2015-09-21 DIAGNOSIS — N2581 Secondary hyperparathyroidism of renal origin: Secondary | ICD-10-CM | POA: Diagnosis not present

## 2015-09-21 DIAGNOSIS — D631 Anemia in chronic kidney disease: Secondary | ICD-10-CM | POA: Diagnosis not present

## 2015-09-24 DIAGNOSIS — D631 Anemia in chronic kidney disease: Secondary | ICD-10-CM | POA: Diagnosis not present

## 2015-09-24 DIAGNOSIS — I82409 Acute embolism and thrombosis of unspecified deep veins of unspecified lower extremity: Secondary | ICD-10-CM | POA: Diagnosis not present

## 2015-09-24 DIAGNOSIS — N2581 Secondary hyperparathyroidism of renal origin: Secondary | ICD-10-CM | POA: Diagnosis not present

## 2015-09-24 DIAGNOSIS — N186 End stage renal disease: Secondary | ICD-10-CM | POA: Diagnosis not present

## 2015-09-26 DIAGNOSIS — N2581 Secondary hyperparathyroidism of renal origin: Secondary | ICD-10-CM | POA: Diagnosis not present

## 2015-09-26 DIAGNOSIS — D631 Anemia in chronic kidney disease: Secondary | ICD-10-CM | POA: Diagnosis not present

## 2015-09-26 DIAGNOSIS — N186 End stage renal disease: Secondary | ICD-10-CM | POA: Diagnosis not present

## 2015-09-28 DIAGNOSIS — N2581 Secondary hyperparathyroidism of renal origin: Secondary | ICD-10-CM | POA: Diagnosis not present

## 2015-09-28 DIAGNOSIS — N186 End stage renal disease: Secondary | ICD-10-CM | POA: Diagnosis not present

## 2015-09-28 DIAGNOSIS — D631 Anemia in chronic kidney disease: Secondary | ICD-10-CM | POA: Diagnosis not present

## 2015-10-01 DIAGNOSIS — I82409 Acute embolism and thrombosis of unspecified deep veins of unspecified lower extremity: Secondary | ICD-10-CM | POA: Diagnosis not present

## 2015-10-01 DIAGNOSIS — N2581 Secondary hyperparathyroidism of renal origin: Secondary | ICD-10-CM | POA: Diagnosis not present

## 2015-10-01 DIAGNOSIS — D631 Anemia in chronic kidney disease: Secondary | ICD-10-CM | POA: Diagnosis not present

## 2015-10-01 DIAGNOSIS — N186 End stage renal disease: Secondary | ICD-10-CM | POA: Diagnosis not present

## 2015-10-02 ENCOUNTER — Other Ambulatory Visit: Payer: Self-pay | Admitting: Family Medicine

## 2015-10-03 DIAGNOSIS — N186 End stage renal disease: Secondary | ICD-10-CM | POA: Diagnosis not present

## 2015-10-03 DIAGNOSIS — D631 Anemia in chronic kidney disease: Secondary | ICD-10-CM | POA: Diagnosis not present

## 2015-10-03 DIAGNOSIS — N2581 Secondary hyperparathyroidism of renal origin: Secondary | ICD-10-CM | POA: Diagnosis not present

## 2015-10-05 DIAGNOSIS — D631 Anemia in chronic kidney disease: Secondary | ICD-10-CM | POA: Diagnosis not present

## 2015-10-05 DIAGNOSIS — N2581 Secondary hyperparathyroidism of renal origin: Secondary | ICD-10-CM | POA: Diagnosis not present

## 2015-10-05 DIAGNOSIS — N186 End stage renal disease: Secondary | ICD-10-CM | POA: Diagnosis not present

## 2015-10-06 DIAGNOSIS — N186 End stage renal disease: Secondary | ICD-10-CM | POA: Diagnosis not present

## 2015-10-06 DIAGNOSIS — Z992 Dependence on renal dialysis: Secondary | ICD-10-CM | POA: Diagnosis not present

## 2015-10-08 DIAGNOSIS — N2581 Secondary hyperparathyroidism of renal origin: Secondary | ICD-10-CM | POA: Diagnosis not present

## 2015-10-08 DIAGNOSIS — Z23 Encounter for immunization: Secondary | ICD-10-CM | POA: Diagnosis not present

## 2015-10-08 DIAGNOSIS — D509 Iron deficiency anemia, unspecified: Secondary | ICD-10-CM | POA: Diagnosis not present

## 2015-10-08 DIAGNOSIS — I82409 Acute embolism and thrombosis of unspecified deep veins of unspecified lower extremity: Secondary | ICD-10-CM | POA: Diagnosis not present

## 2015-10-08 DIAGNOSIS — N186 End stage renal disease: Secondary | ICD-10-CM | POA: Diagnosis not present

## 2015-10-08 DIAGNOSIS — D631 Anemia in chronic kidney disease: Secondary | ICD-10-CM | POA: Diagnosis not present

## 2015-10-10 DIAGNOSIS — N186 End stage renal disease: Secondary | ICD-10-CM | POA: Diagnosis not present

## 2015-10-10 DIAGNOSIS — N2581 Secondary hyperparathyroidism of renal origin: Secondary | ICD-10-CM | POA: Diagnosis not present

## 2015-10-10 DIAGNOSIS — D631 Anemia in chronic kidney disease: Secondary | ICD-10-CM | POA: Diagnosis not present

## 2015-10-10 DIAGNOSIS — E119 Type 2 diabetes mellitus without complications: Secondary | ICD-10-CM | POA: Diagnosis not present

## 2015-10-10 DIAGNOSIS — D509 Iron deficiency anemia, unspecified: Secondary | ICD-10-CM | POA: Diagnosis not present

## 2015-10-10 DIAGNOSIS — Z23 Encounter for immunization: Secondary | ICD-10-CM | POA: Diagnosis not present

## 2015-10-12 DIAGNOSIS — N2581 Secondary hyperparathyroidism of renal origin: Secondary | ICD-10-CM | POA: Diagnosis not present

## 2015-10-12 DIAGNOSIS — N186 End stage renal disease: Secondary | ICD-10-CM | POA: Diagnosis not present

## 2015-10-12 DIAGNOSIS — Z23 Encounter for immunization: Secondary | ICD-10-CM | POA: Diagnosis not present

## 2015-10-12 DIAGNOSIS — D509 Iron deficiency anemia, unspecified: Secondary | ICD-10-CM | POA: Diagnosis not present

## 2015-10-12 DIAGNOSIS — D631 Anemia in chronic kidney disease: Secondary | ICD-10-CM | POA: Diagnosis not present

## 2015-10-15 DIAGNOSIS — I82409 Acute embolism and thrombosis of unspecified deep veins of unspecified lower extremity: Secondary | ICD-10-CM | POA: Diagnosis not present

## 2015-10-15 DIAGNOSIS — Z23 Encounter for immunization: Secondary | ICD-10-CM | POA: Diagnosis not present

## 2015-10-15 DIAGNOSIS — D509 Iron deficiency anemia, unspecified: Secondary | ICD-10-CM | POA: Diagnosis not present

## 2015-10-15 DIAGNOSIS — N186 End stage renal disease: Secondary | ICD-10-CM | POA: Diagnosis not present

## 2015-10-15 DIAGNOSIS — D631 Anemia in chronic kidney disease: Secondary | ICD-10-CM | POA: Diagnosis not present

## 2015-10-15 DIAGNOSIS — N2581 Secondary hyperparathyroidism of renal origin: Secondary | ICD-10-CM | POA: Diagnosis not present

## 2015-10-17 DIAGNOSIS — N186 End stage renal disease: Secondary | ICD-10-CM | POA: Diagnosis not present

## 2015-10-17 DIAGNOSIS — N2581 Secondary hyperparathyroidism of renal origin: Secondary | ICD-10-CM | POA: Diagnosis not present

## 2015-10-17 DIAGNOSIS — D631 Anemia in chronic kidney disease: Secondary | ICD-10-CM | POA: Diagnosis not present

## 2015-10-19 DIAGNOSIS — D631 Anemia in chronic kidney disease: Secondary | ICD-10-CM | POA: Diagnosis not present

## 2015-10-19 DIAGNOSIS — N186 End stage renal disease: Secondary | ICD-10-CM | POA: Diagnosis not present

## 2015-10-19 DIAGNOSIS — N2581 Secondary hyperparathyroidism of renal origin: Secondary | ICD-10-CM | POA: Diagnosis not present

## 2015-10-22 DIAGNOSIS — N2581 Secondary hyperparathyroidism of renal origin: Secondary | ICD-10-CM | POA: Diagnosis not present

## 2015-10-22 DIAGNOSIS — D631 Anemia in chronic kidney disease: Secondary | ICD-10-CM | POA: Diagnosis not present

## 2015-10-22 DIAGNOSIS — N186 End stage renal disease: Secondary | ICD-10-CM | POA: Diagnosis not present

## 2015-10-22 DIAGNOSIS — I82409 Acute embolism and thrombosis of unspecified deep veins of unspecified lower extremity: Secondary | ICD-10-CM | POA: Diagnosis not present

## 2015-10-24 DIAGNOSIS — N2581 Secondary hyperparathyroidism of renal origin: Secondary | ICD-10-CM | POA: Diagnosis not present

## 2015-10-24 DIAGNOSIS — N186 End stage renal disease: Secondary | ICD-10-CM | POA: Diagnosis not present

## 2015-10-24 DIAGNOSIS — D631 Anemia in chronic kidney disease: Secondary | ICD-10-CM | POA: Diagnosis not present

## 2015-10-26 DIAGNOSIS — N2581 Secondary hyperparathyroidism of renal origin: Secondary | ICD-10-CM | POA: Diagnosis not present

## 2015-10-26 DIAGNOSIS — N186 End stage renal disease: Secondary | ICD-10-CM | POA: Diagnosis not present

## 2015-10-26 DIAGNOSIS — D631 Anemia in chronic kidney disease: Secondary | ICD-10-CM | POA: Diagnosis not present

## 2015-10-29 DIAGNOSIS — N2581 Secondary hyperparathyroidism of renal origin: Secondary | ICD-10-CM | POA: Diagnosis not present

## 2015-10-29 DIAGNOSIS — D631 Anemia in chronic kidney disease: Secondary | ICD-10-CM | POA: Diagnosis not present

## 2015-10-29 DIAGNOSIS — N186 End stage renal disease: Secondary | ICD-10-CM | POA: Diagnosis not present

## 2015-10-29 DIAGNOSIS — I82409 Acute embolism and thrombosis of unspecified deep veins of unspecified lower extremity: Secondary | ICD-10-CM | POA: Diagnosis not present

## 2015-10-31 DIAGNOSIS — M25551 Pain in right hip: Secondary | ICD-10-CM | POA: Diagnosis not present

## 2015-10-31 DIAGNOSIS — M87852 Other osteonecrosis, left femur: Secondary | ICD-10-CM | POA: Diagnosis not present

## 2015-10-31 DIAGNOSIS — D631 Anemia in chronic kidney disease: Secondary | ICD-10-CM | POA: Diagnosis not present

## 2015-10-31 DIAGNOSIS — N2581 Secondary hyperparathyroidism of renal origin: Secondary | ICD-10-CM | POA: Diagnosis not present

## 2015-10-31 DIAGNOSIS — M160 Bilateral primary osteoarthritis of hip: Secondary | ICD-10-CM | POA: Insufficient documentation

## 2015-10-31 DIAGNOSIS — Z7901 Long term (current) use of anticoagulants: Secondary | ICD-10-CM | POA: Diagnosis not present

## 2015-10-31 DIAGNOSIS — Z888 Allergy status to other drugs, medicaments and biological substances status: Secondary | ICD-10-CM | POA: Diagnosis not present

## 2015-10-31 DIAGNOSIS — M25552 Pain in left hip: Secondary | ICD-10-CM | POA: Diagnosis not present

## 2015-10-31 DIAGNOSIS — M87851 Other osteonecrosis, right femur: Secondary | ICD-10-CM | POA: Diagnosis not present

## 2015-10-31 DIAGNOSIS — N186 End stage renal disease: Secondary | ICD-10-CM | POA: Diagnosis not present

## 2015-10-31 DIAGNOSIS — Z79899 Other long term (current) drug therapy: Secondary | ICD-10-CM | POA: Diagnosis not present

## 2015-10-31 DIAGNOSIS — I10 Essential (primary) hypertension: Secondary | ICD-10-CM | POA: Diagnosis not present

## 2015-11-02 DIAGNOSIS — D631 Anemia in chronic kidney disease: Secondary | ICD-10-CM | POA: Diagnosis not present

## 2015-11-02 DIAGNOSIS — N186 End stage renal disease: Secondary | ICD-10-CM | POA: Diagnosis not present

## 2015-11-02 DIAGNOSIS — N2581 Secondary hyperparathyroidism of renal origin: Secondary | ICD-10-CM | POA: Diagnosis not present

## 2015-11-05 DIAGNOSIS — N186 End stage renal disease: Secondary | ICD-10-CM | POA: Diagnosis not present

## 2015-11-05 DIAGNOSIS — I82409 Acute embolism and thrombosis of unspecified deep veins of unspecified lower extremity: Secondary | ICD-10-CM | POA: Diagnosis not present

## 2015-11-05 DIAGNOSIS — D631 Anemia in chronic kidney disease: Secondary | ICD-10-CM | POA: Diagnosis not present

## 2015-11-05 DIAGNOSIS — N2581 Secondary hyperparathyroidism of renal origin: Secondary | ICD-10-CM | POA: Diagnosis not present

## 2015-11-06 DIAGNOSIS — N186 End stage renal disease: Secondary | ICD-10-CM | POA: Diagnosis not present

## 2015-11-06 DIAGNOSIS — Z992 Dependence on renal dialysis: Secondary | ICD-10-CM | POA: Diagnosis not present

## 2015-11-07 DIAGNOSIS — D631 Anemia in chronic kidney disease: Secondary | ICD-10-CM | POA: Diagnosis not present

## 2015-11-07 DIAGNOSIS — D509 Iron deficiency anemia, unspecified: Secondary | ICD-10-CM | POA: Diagnosis not present

## 2015-11-07 DIAGNOSIS — N186 End stage renal disease: Secondary | ICD-10-CM | POA: Diagnosis not present

## 2015-11-07 DIAGNOSIS — N2581 Secondary hyperparathyroidism of renal origin: Secondary | ICD-10-CM | POA: Diagnosis not present

## 2015-11-07 DIAGNOSIS — E119 Type 2 diabetes mellitus without complications: Secondary | ICD-10-CM | POA: Diagnosis not present

## 2015-11-09 DIAGNOSIS — N2581 Secondary hyperparathyroidism of renal origin: Secondary | ICD-10-CM | POA: Diagnosis not present

## 2015-11-09 DIAGNOSIS — D631 Anemia in chronic kidney disease: Secondary | ICD-10-CM | POA: Diagnosis not present

## 2015-11-09 DIAGNOSIS — D509 Iron deficiency anemia, unspecified: Secondary | ICD-10-CM | POA: Diagnosis not present

## 2015-11-09 DIAGNOSIS — N186 End stage renal disease: Secondary | ICD-10-CM | POA: Diagnosis not present

## 2015-11-12 DIAGNOSIS — D631 Anemia in chronic kidney disease: Secondary | ICD-10-CM | POA: Diagnosis not present

## 2015-11-12 DIAGNOSIS — I82409 Acute embolism and thrombosis of unspecified deep veins of unspecified lower extremity: Secondary | ICD-10-CM | POA: Diagnosis not present

## 2015-11-12 DIAGNOSIS — D509 Iron deficiency anemia, unspecified: Secondary | ICD-10-CM | POA: Diagnosis not present

## 2015-11-12 DIAGNOSIS — N186 End stage renal disease: Secondary | ICD-10-CM | POA: Diagnosis not present

## 2015-11-12 DIAGNOSIS — N2581 Secondary hyperparathyroidism of renal origin: Secondary | ICD-10-CM | POA: Diagnosis not present

## 2015-11-14 DIAGNOSIS — N186 End stage renal disease: Secondary | ICD-10-CM | POA: Diagnosis not present

## 2015-11-14 DIAGNOSIS — N2581 Secondary hyperparathyroidism of renal origin: Secondary | ICD-10-CM | POA: Diagnosis not present

## 2015-11-14 DIAGNOSIS — D631 Anemia in chronic kidney disease: Secondary | ICD-10-CM | POA: Diagnosis not present

## 2015-11-14 DIAGNOSIS — D509 Iron deficiency anemia, unspecified: Secondary | ICD-10-CM | POA: Diagnosis not present

## 2015-11-16 DIAGNOSIS — D509 Iron deficiency anemia, unspecified: Secondary | ICD-10-CM | POA: Diagnosis not present

## 2015-11-16 DIAGNOSIS — N2581 Secondary hyperparathyroidism of renal origin: Secondary | ICD-10-CM | POA: Diagnosis not present

## 2015-11-16 DIAGNOSIS — N186 End stage renal disease: Secondary | ICD-10-CM | POA: Diagnosis not present

## 2015-11-16 DIAGNOSIS — D631 Anemia in chronic kidney disease: Secondary | ICD-10-CM | POA: Diagnosis not present

## 2015-11-19 DIAGNOSIS — N186 End stage renal disease: Secondary | ICD-10-CM | POA: Diagnosis not present

## 2015-11-19 DIAGNOSIS — D631 Anemia in chronic kidney disease: Secondary | ICD-10-CM | POA: Diagnosis not present

## 2015-11-19 DIAGNOSIS — N2581 Secondary hyperparathyroidism of renal origin: Secondary | ICD-10-CM | POA: Diagnosis not present

## 2015-11-19 DIAGNOSIS — I82409 Acute embolism and thrombosis of unspecified deep veins of unspecified lower extremity: Secondary | ICD-10-CM | POA: Diagnosis not present

## 2015-11-21 DIAGNOSIS — D631 Anemia in chronic kidney disease: Secondary | ICD-10-CM | POA: Diagnosis not present

## 2015-11-21 DIAGNOSIS — N186 End stage renal disease: Secondary | ICD-10-CM | POA: Diagnosis not present

## 2015-11-21 DIAGNOSIS — N2581 Secondary hyperparathyroidism of renal origin: Secondary | ICD-10-CM | POA: Diagnosis not present

## 2015-11-23 DIAGNOSIS — D631 Anemia in chronic kidney disease: Secondary | ICD-10-CM | POA: Diagnosis not present

## 2015-11-23 DIAGNOSIS — N186 End stage renal disease: Secondary | ICD-10-CM | POA: Diagnosis not present

## 2015-11-23 DIAGNOSIS — N2581 Secondary hyperparathyroidism of renal origin: Secondary | ICD-10-CM | POA: Diagnosis not present

## 2015-11-25 DIAGNOSIS — N2581 Secondary hyperparathyroidism of renal origin: Secondary | ICD-10-CM | POA: Diagnosis not present

## 2015-11-25 DIAGNOSIS — D631 Anemia in chronic kidney disease: Secondary | ICD-10-CM | POA: Diagnosis not present

## 2015-11-25 DIAGNOSIS — N186 End stage renal disease: Secondary | ICD-10-CM | POA: Diagnosis not present

## 2015-11-27 DIAGNOSIS — I82409 Acute embolism and thrombosis of unspecified deep veins of unspecified lower extremity: Secondary | ICD-10-CM | POA: Diagnosis not present

## 2015-11-27 DIAGNOSIS — N2581 Secondary hyperparathyroidism of renal origin: Secondary | ICD-10-CM | POA: Diagnosis not present

## 2015-11-27 DIAGNOSIS — N186 End stage renal disease: Secondary | ICD-10-CM | POA: Diagnosis not present

## 2015-11-27 DIAGNOSIS — D631 Anemia in chronic kidney disease: Secondary | ICD-10-CM | POA: Diagnosis not present

## 2015-11-30 DIAGNOSIS — N186 End stage renal disease: Secondary | ICD-10-CM | POA: Diagnosis not present

## 2015-11-30 DIAGNOSIS — N2581 Secondary hyperparathyroidism of renal origin: Secondary | ICD-10-CM | POA: Diagnosis not present

## 2015-11-30 DIAGNOSIS — D631 Anemia in chronic kidney disease: Secondary | ICD-10-CM | POA: Diagnosis not present

## 2015-12-03 DIAGNOSIS — D631 Anemia in chronic kidney disease: Secondary | ICD-10-CM | POA: Diagnosis not present

## 2015-12-03 DIAGNOSIS — N2581 Secondary hyperparathyroidism of renal origin: Secondary | ICD-10-CM | POA: Diagnosis not present

## 2015-12-03 DIAGNOSIS — N186 End stage renal disease: Secondary | ICD-10-CM | POA: Diagnosis not present

## 2015-12-03 DIAGNOSIS — I82409 Acute embolism and thrombosis of unspecified deep veins of unspecified lower extremity: Secondary | ICD-10-CM | POA: Diagnosis not present

## 2015-12-05 DIAGNOSIS — N186 End stage renal disease: Secondary | ICD-10-CM | POA: Diagnosis not present

## 2015-12-05 DIAGNOSIS — D631 Anemia in chronic kidney disease: Secondary | ICD-10-CM | POA: Diagnosis not present

## 2015-12-05 DIAGNOSIS — N2581 Secondary hyperparathyroidism of renal origin: Secondary | ICD-10-CM | POA: Diagnosis not present

## 2015-12-06 DIAGNOSIS — N186 End stage renal disease: Secondary | ICD-10-CM | POA: Diagnosis not present

## 2015-12-06 DIAGNOSIS — Z992 Dependence on renal dialysis: Secondary | ICD-10-CM | POA: Diagnosis not present

## 2015-12-07 DIAGNOSIS — D509 Iron deficiency anemia, unspecified: Secondary | ICD-10-CM | POA: Diagnosis not present

## 2015-12-07 DIAGNOSIS — D631 Anemia in chronic kidney disease: Secondary | ICD-10-CM | POA: Diagnosis not present

## 2015-12-07 DIAGNOSIS — N2581 Secondary hyperparathyroidism of renal origin: Secondary | ICD-10-CM | POA: Diagnosis not present

## 2015-12-07 DIAGNOSIS — N186 End stage renal disease: Secondary | ICD-10-CM | POA: Diagnosis not present

## 2015-12-10 DIAGNOSIS — N186 End stage renal disease: Secondary | ICD-10-CM | POA: Diagnosis not present

## 2015-12-10 DIAGNOSIS — D509 Iron deficiency anemia, unspecified: Secondary | ICD-10-CM | POA: Diagnosis not present

## 2015-12-10 DIAGNOSIS — N2581 Secondary hyperparathyroidism of renal origin: Secondary | ICD-10-CM | POA: Diagnosis not present

## 2015-12-10 DIAGNOSIS — I82409 Acute embolism and thrombosis of unspecified deep veins of unspecified lower extremity: Secondary | ICD-10-CM | POA: Diagnosis not present

## 2015-12-10 DIAGNOSIS — D631 Anemia in chronic kidney disease: Secondary | ICD-10-CM | POA: Diagnosis not present

## 2015-12-11 DIAGNOSIS — M329 Systemic lupus erythematosus, unspecified: Secondary | ICD-10-CM | POA: Diagnosis not present

## 2015-12-11 DIAGNOSIS — I7300 Raynaud's syndrome without gangrene: Secondary | ICD-10-CM | POA: Diagnosis not present

## 2015-12-11 DIAGNOSIS — R768 Other specified abnormal immunological findings in serum: Secondary | ICD-10-CM | POA: Diagnosis not present

## 2015-12-11 DIAGNOSIS — N186 End stage renal disease: Secondary | ICD-10-CM | POA: Diagnosis not present

## 2015-12-12 DIAGNOSIS — N186 End stage renal disease: Secondary | ICD-10-CM | POA: Diagnosis not present

## 2015-12-12 DIAGNOSIS — E119 Type 2 diabetes mellitus without complications: Secondary | ICD-10-CM | POA: Diagnosis not present

## 2015-12-12 DIAGNOSIS — D631 Anemia in chronic kidney disease: Secondary | ICD-10-CM | POA: Diagnosis not present

## 2015-12-12 DIAGNOSIS — D509 Iron deficiency anemia, unspecified: Secondary | ICD-10-CM | POA: Diagnosis not present

## 2015-12-12 DIAGNOSIS — N2581 Secondary hyperparathyroidism of renal origin: Secondary | ICD-10-CM | POA: Diagnosis not present

## 2015-12-14 DIAGNOSIS — N186 End stage renal disease: Secondary | ICD-10-CM | POA: Diagnosis not present

## 2015-12-14 DIAGNOSIS — N2581 Secondary hyperparathyroidism of renal origin: Secondary | ICD-10-CM | POA: Diagnosis not present

## 2015-12-14 DIAGNOSIS — D631 Anemia in chronic kidney disease: Secondary | ICD-10-CM | POA: Diagnosis not present

## 2015-12-14 DIAGNOSIS — D509 Iron deficiency anemia, unspecified: Secondary | ICD-10-CM | POA: Diagnosis not present

## 2015-12-17 DIAGNOSIS — I82409 Acute embolism and thrombosis of unspecified deep veins of unspecified lower extremity: Secondary | ICD-10-CM | POA: Diagnosis not present

## 2015-12-17 DIAGNOSIS — D509 Iron deficiency anemia, unspecified: Secondary | ICD-10-CM | POA: Diagnosis not present

## 2015-12-17 DIAGNOSIS — D631 Anemia in chronic kidney disease: Secondary | ICD-10-CM | POA: Diagnosis not present

## 2015-12-17 DIAGNOSIS — N2581 Secondary hyperparathyroidism of renal origin: Secondary | ICD-10-CM | POA: Diagnosis not present

## 2015-12-17 DIAGNOSIS — N186 End stage renal disease: Secondary | ICD-10-CM | POA: Diagnosis not present

## 2015-12-19 DIAGNOSIS — N2581 Secondary hyperparathyroidism of renal origin: Secondary | ICD-10-CM | POA: Diagnosis not present

## 2015-12-19 DIAGNOSIS — N186 End stage renal disease: Secondary | ICD-10-CM | POA: Diagnosis not present

## 2015-12-19 DIAGNOSIS — D631 Anemia in chronic kidney disease: Secondary | ICD-10-CM | POA: Diagnosis not present

## 2015-12-19 DIAGNOSIS — D509 Iron deficiency anemia, unspecified: Secondary | ICD-10-CM | POA: Diagnosis not present

## 2015-12-21 DIAGNOSIS — D631 Anemia in chronic kidney disease: Secondary | ICD-10-CM | POA: Diagnosis not present

## 2015-12-21 DIAGNOSIS — D509 Iron deficiency anemia, unspecified: Secondary | ICD-10-CM | POA: Diagnosis not present

## 2015-12-21 DIAGNOSIS — N186 End stage renal disease: Secondary | ICD-10-CM | POA: Diagnosis not present

## 2015-12-21 DIAGNOSIS — N2581 Secondary hyperparathyroidism of renal origin: Secondary | ICD-10-CM | POA: Diagnosis not present

## 2015-12-24 DIAGNOSIS — N186 End stage renal disease: Secondary | ICD-10-CM | POA: Diagnosis not present

## 2015-12-24 DIAGNOSIS — D509 Iron deficiency anemia, unspecified: Secondary | ICD-10-CM | POA: Diagnosis not present

## 2015-12-24 DIAGNOSIS — D631 Anemia in chronic kidney disease: Secondary | ICD-10-CM | POA: Diagnosis not present

## 2015-12-24 DIAGNOSIS — I82409 Acute embolism and thrombosis of unspecified deep veins of unspecified lower extremity: Secondary | ICD-10-CM | POA: Diagnosis not present

## 2015-12-24 DIAGNOSIS — N2581 Secondary hyperparathyroidism of renal origin: Secondary | ICD-10-CM | POA: Diagnosis not present

## 2015-12-26 DIAGNOSIS — D509 Iron deficiency anemia, unspecified: Secondary | ICD-10-CM | POA: Diagnosis not present

## 2015-12-26 DIAGNOSIS — N2581 Secondary hyperparathyroidism of renal origin: Secondary | ICD-10-CM | POA: Diagnosis not present

## 2015-12-26 DIAGNOSIS — D631 Anemia in chronic kidney disease: Secondary | ICD-10-CM | POA: Diagnosis not present

## 2015-12-26 DIAGNOSIS — Z114 Encounter for screening for human immunodeficiency virus [HIV]: Secondary | ICD-10-CM | POA: Diagnosis not present

## 2015-12-26 DIAGNOSIS — N186 End stage renal disease: Secondary | ICD-10-CM | POA: Diagnosis not present

## 2015-12-28 DIAGNOSIS — D631 Anemia in chronic kidney disease: Secondary | ICD-10-CM | POA: Diagnosis not present

## 2015-12-28 DIAGNOSIS — N2581 Secondary hyperparathyroidism of renal origin: Secondary | ICD-10-CM | POA: Diagnosis not present

## 2015-12-28 DIAGNOSIS — N186 End stage renal disease: Secondary | ICD-10-CM | POA: Diagnosis not present

## 2015-12-30 DIAGNOSIS — D631 Anemia in chronic kidney disease: Secondary | ICD-10-CM | POA: Diagnosis not present

## 2015-12-30 DIAGNOSIS — N186 End stage renal disease: Secondary | ICD-10-CM | POA: Diagnosis not present

## 2015-12-30 DIAGNOSIS — N2581 Secondary hyperparathyroidism of renal origin: Secondary | ICD-10-CM | POA: Diagnosis not present

## 2016-01-01 ENCOUNTER — Other Ambulatory Visit: Payer: Self-pay | Admitting: Family Medicine

## 2016-01-02 DIAGNOSIS — N186 End stage renal disease: Secondary | ICD-10-CM | POA: Diagnosis not present

## 2016-01-02 DIAGNOSIS — I82409 Acute embolism and thrombosis of unspecified deep veins of unspecified lower extremity: Secondary | ICD-10-CM | POA: Diagnosis not present

## 2016-01-02 DIAGNOSIS — N2581 Secondary hyperparathyroidism of renal origin: Secondary | ICD-10-CM | POA: Diagnosis not present

## 2016-01-02 DIAGNOSIS — D631 Anemia in chronic kidney disease: Secondary | ICD-10-CM | POA: Diagnosis not present

## 2016-01-04 DIAGNOSIS — N186 End stage renal disease: Secondary | ICD-10-CM | POA: Diagnosis not present

## 2016-01-04 DIAGNOSIS — D631 Anemia in chronic kidney disease: Secondary | ICD-10-CM | POA: Diagnosis not present

## 2016-01-04 DIAGNOSIS — N2581 Secondary hyperparathyroidism of renal origin: Secondary | ICD-10-CM | POA: Diagnosis not present

## 2016-01-06 DIAGNOSIS — N186 End stage renal disease: Secondary | ICD-10-CM | POA: Diagnosis not present

## 2016-01-06 DIAGNOSIS — Z992 Dependence on renal dialysis: Secondary | ICD-10-CM | POA: Diagnosis not present

## 2016-01-07 DIAGNOSIS — I82409 Acute embolism and thrombosis of unspecified deep veins of unspecified lower extremity: Secondary | ICD-10-CM | POA: Diagnosis not present

## 2016-01-07 DIAGNOSIS — N2581 Secondary hyperparathyroidism of renal origin: Secondary | ICD-10-CM | POA: Diagnosis not present

## 2016-01-07 DIAGNOSIS — N186 End stage renal disease: Secondary | ICD-10-CM | POA: Diagnosis not present

## 2016-01-07 DIAGNOSIS — D631 Anemia in chronic kidney disease: Secondary | ICD-10-CM | POA: Diagnosis not present

## 2016-01-07 DIAGNOSIS — D509 Iron deficiency anemia, unspecified: Secondary | ICD-10-CM | POA: Diagnosis not present

## 2016-01-09 DIAGNOSIS — N186 End stage renal disease: Secondary | ICD-10-CM | POA: Diagnosis not present

## 2016-01-09 DIAGNOSIS — D631 Anemia in chronic kidney disease: Secondary | ICD-10-CM | POA: Diagnosis not present

## 2016-01-09 DIAGNOSIS — E119 Type 2 diabetes mellitus without complications: Secondary | ICD-10-CM | POA: Diagnosis not present

## 2016-01-09 DIAGNOSIS — N2581 Secondary hyperparathyroidism of renal origin: Secondary | ICD-10-CM | POA: Diagnosis not present

## 2016-01-09 DIAGNOSIS — D509 Iron deficiency anemia, unspecified: Secondary | ICD-10-CM | POA: Diagnosis not present

## 2016-01-11 DIAGNOSIS — D509 Iron deficiency anemia, unspecified: Secondary | ICD-10-CM | POA: Diagnosis not present

## 2016-01-11 DIAGNOSIS — N186 End stage renal disease: Secondary | ICD-10-CM | POA: Diagnosis not present

## 2016-01-11 DIAGNOSIS — N2581 Secondary hyperparathyroidism of renal origin: Secondary | ICD-10-CM | POA: Diagnosis not present

## 2016-01-11 DIAGNOSIS — D631 Anemia in chronic kidney disease: Secondary | ICD-10-CM | POA: Diagnosis not present

## 2016-01-14 DIAGNOSIS — N186 End stage renal disease: Secondary | ICD-10-CM | POA: Diagnosis not present

## 2016-01-14 DIAGNOSIS — D509 Iron deficiency anemia, unspecified: Secondary | ICD-10-CM | POA: Diagnosis not present

## 2016-01-14 DIAGNOSIS — D631 Anemia in chronic kidney disease: Secondary | ICD-10-CM | POA: Diagnosis not present

## 2016-01-14 DIAGNOSIS — N2581 Secondary hyperparathyroidism of renal origin: Secondary | ICD-10-CM | POA: Diagnosis not present

## 2016-01-14 DIAGNOSIS — I82409 Acute embolism and thrombosis of unspecified deep veins of unspecified lower extremity: Secondary | ICD-10-CM | POA: Diagnosis not present

## 2016-01-16 DIAGNOSIS — D509 Iron deficiency anemia, unspecified: Secondary | ICD-10-CM | POA: Diagnosis not present

## 2016-01-16 DIAGNOSIS — N2581 Secondary hyperparathyroidism of renal origin: Secondary | ICD-10-CM | POA: Diagnosis not present

## 2016-01-16 DIAGNOSIS — N186 End stage renal disease: Secondary | ICD-10-CM | POA: Diagnosis not present

## 2016-01-16 DIAGNOSIS — D631 Anemia in chronic kidney disease: Secondary | ICD-10-CM | POA: Diagnosis not present

## 2016-01-18 DIAGNOSIS — D631 Anemia in chronic kidney disease: Secondary | ICD-10-CM | POA: Diagnosis not present

## 2016-01-18 DIAGNOSIS — N2581 Secondary hyperparathyroidism of renal origin: Secondary | ICD-10-CM | POA: Diagnosis not present

## 2016-01-18 DIAGNOSIS — N186 End stage renal disease: Secondary | ICD-10-CM | POA: Diagnosis not present

## 2016-01-21 DIAGNOSIS — N2581 Secondary hyperparathyroidism of renal origin: Secondary | ICD-10-CM | POA: Diagnosis not present

## 2016-01-21 DIAGNOSIS — D631 Anemia in chronic kidney disease: Secondary | ICD-10-CM | POA: Diagnosis not present

## 2016-01-21 DIAGNOSIS — I82409 Acute embolism and thrombosis of unspecified deep veins of unspecified lower extremity: Secondary | ICD-10-CM | POA: Diagnosis not present

## 2016-01-21 DIAGNOSIS — N186 End stage renal disease: Secondary | ICD-10-CM | POA: Diagnosis not present

## 2016-01-25 DIAGNOSIS — N2581 Secondary hyperparathyroidism of renal origin: Secondary | ICD-10-CM | POA: Diagnosis not present

## 2016-01-25 DIAGNOSIS — D631 Anemia in chronic kidney disease: Secondary | ICD-10-CM | POA: Diagnosis not present

## 2016-01-25 DIAGNOSIS — N186 End stage renal disease: Secondary | ICD-10-CM | POA: Diagnosis not present

## 2016-01-25 DIAGNOSIS — Z1159 Encounter for screening for other viral diseases: Secondary | ICD-10-CM | POA: Diagnosis not present

## 2016-01-28 DIAGNOSIS — N2581 Secondary hyperparathyroidism of renal origin: Secondary | ICD-10-CM | POA: Diagnosis not present

## 2016-01-28 DIAGNOSIS — I82409 Acute embolism and thrombosis of unspecified deep veins of unspecified lower extremity: Secondary | ICD-10-CM | POA: Diagnosis not present

## 2016-01-28 DIAGNOSIS — N186 End stage renal disease: Secondary | ICD-10-CM | POA: Diagnosis not present

## 2016-01-28 DIAGNOSIS — D631 Anemia in chronic kidney disease: Secondary | ICD-10-CM | POA: Diagnosis not present

## 2016-01-30 DIAGNOSIS — N186 End stage renal disease: Secondary | ICD-10-CM | POA: Diagnosis not present

## 2016-01-30 DIAGNOSIS — D631 Anemia in chronic kidney disease: Secondary | ICD-10-CM | POA: Diagnosis not present

## 2016-01-30 DIAGNOSIS — N2581 Secondary hyperparathyroidism of renal origin: Secondary | ICD-10-CM | POA: Diagnosis not present

## 2016-02-01 DIAGNOSIS — N2581 Secondary hyperparathyroidism of renal origin: Secondary | ICD-10-CM | POA: Diagnosis not present

## 2016-02-01 DIAGNOSIS — D631 Anemia in chronic kidney disease: Secondary | ICD-10-CM | POA: Diagnosis not present

## 2016-02-01 DIAGNOSIS — N186 End stage renal disease: Secondary | ICD-10-CM | POA: Diagnosis not present

## 2016-02-04 DIAGNOSIS — N2581 Secondary hyperparathyroidism of renal origin: Secondary | ICD-10-CM | POA: Diagnosis not present

## 2016-02-04 DIAGNOSIS — I82409 Acute embolism and thrombosis of unspecified deep veins of unspecified lower extremity: Secondary | ICD-10-CM | POA: Diagnosis not present

## 2016-02-04 DIAGNOSIS — N186 End stage renal disease: Secondary | ICD-10-CM | POA: Diagnosis not present

## 2016-02-04 DIAGNOSIS — D631 Anemia in chronic kidney disease: Secondary | ICD-10-CM | POA: Diagnosis not present

## 2016-02-06 DIAGNOSIS — N2581 Secondary hyperparathyroidism of renal origin: Secondary | ICD-10-CM | POA: Diagnosis not present

## 2016-02-06 DIAGNOSIS — N186 End stage renal disease: Secondary | ICD-10-CM | POA: Diagnosis not present

## 2016-02-06 DIAGNOSIS — D631 Anemia in chronic kidney disease: Secondary | ICD-10-CM | POA: Diagnosis not present

## 2016-02-06 DIAGNOSIS — Z992 Dependence on renal dialysis: Secondary | ICD-10-CM | POA: Diagnosis not present

## 2016-02-08 DIAGNOSIS — D509 Iron deficiency anemia, unspecified: Secondary | ICD-10-CM | POA: Diagnosis not present

## 2016-02-08 DIAGNOSIS — N186 End stage renal disease: Secondary | ICD-10-CM | POA: Diagnosis not present

## 2016-02-08 DIAGNOSIS — N2581 Secondary hyperparathyroidism of renal origin: Secondary | ICD-10-CM | POA: Diagnosis not present

## 2016-02-11 DIAGNOSIS — N186 End stage renal disease: Secondary | ICD-10-CM | POA: Diagnosis not present

## 2016-02-11 DIAGNOSIS — D509 Iron deficiency anemia, unspecified: Secondary | ICD-10-CM | POA: Diagnosis not present

## 2016-02-11 DIAGNOSIS — I82409 Acute embolism and thrombosis of unspecified deep veins of unspecified lower extremity: Secondary | ICD-10-CM | POA: Diagnosis not present

## 2016-02-11 DIAGNOSIS — N2581 Secondary hyperparathyroidism of renal origin: Secondary | ICD-10-CM | POA: Diagnosis not present

## 2016-02-13 DIAGNOSIS — D509 Iron deficiency anemia, unspecified: Secondary | ICD-10-CM | POA: Diagnosis not present

## 2016-02-13 DIAGNOSIS — N2581 Secondary hyperparathyroidism of renal origin: Secondary | ICD-10-CM | POA: Diagnosis not present

## 2016-02-13 DIAGNOSIS — E1139 Type 2 diabetes mellitus with other diabetic ophthalmic complication: Secondary | ICD-10-CM | POA: Diagnosis not present

## 2016-02-13 DIAGNOSIS — N186 End stage renal disease: Secondary | ICD-10-CM | POA: Diagnosis not present

## 2016-02-15 DIAGNOSIS — N186 End stage renal disease: Secondary | ICD-10-CM | POA: Diagnosis not present

## 2016-02-15 DIAGNOSIS — N2581 Secondary hyperparathyroidism of renal origin: Secondary | ICD-10-CM | POA: Diagnosis not present

## 2016-02-15 DIAGNOSIS — D509 Iron deficiency anemia, unspecified: Secondary | ICD-10-CM | POA: Diagnosis not present

## 2016-02-18 DIAGNOSIS — D509 Iron deficiency anemia, unspecified: Secondary | ICD-10-CM | POA: Diagnosis not present

## 2016-02-18 DIAGNOSIS — D631 Anemia in chronic kidney disease: Secondary | ICD-10-CM | POA: Diagnosis not present

## 2016-02-18 DIAGNOSIS — N186 End stage renal disease: Secondary | ICD-10-CM | POA: Diagnosis not present

## 2016-02-18 DIAGNOSIS — N2581 Secondary hyperparathyroidism of renal origin: Secondary | ICD-10-CM | POA: Diagnosis not present

## 2016-02-18 DIAGNOSIS — I82409 Acute embolism and thrombosis of unspecified deep veins of unspecified lower extremity: Secondary | ICD-10-CM | POA: Diagnosis not present

## 2016-02-20 DIAGNOSIS — N2581 Secondary hyperparathyroidism of renal origin: Secondary | ICD-10-CM | POA: Diagnosis not present

## 2016-02-20 DIAGNOSIS — D631 Anemia in chronic kidney disease: Secondary | ICD-10-CM | POA: Diagnosis not present

## 2016-02-20 DIAGNOSIS — D509 Iron deficiency anemia, unspecified: Secondary | ICD-10-CM | POA: Diagnosis not present

## 2016-02-20 DIAGNOSIS — N186 End stage renal disease: Secondary | ICD-10-CM | POA: Diagnosis not present

## 2016-02-22 DIAGNOSIS — D509 Iron deficiency anemia, unspecified: Secondary | ICD-10-CM | POA: Diagnosis not present

## 2016-02-22 DIAGNOSIS — N2581 Secondary hyperparathyroidism of renal origin: Secondary | ICD-10-CM | POA: Diagnosis not present

## 2016-02-22 DIAGNOSIS — N186 End stage renal disease: Secondary | ICD-10-CM | POA: Diagnosis not present

## 2016-02-22 DIAGNOSIS — D631 Anemia in chronic kidney disease: Secondary | ICD-10-CM | POA: Diagnosis not present

## 2016-02-25 DIAGNOSIS — D509 Iron deficiency anemia, unspecified: Secondary | ICD-10-CM | POA: Diagnosis not present

## 2016-02-25 DIAGNOSIS — I82409 Acute embolism and thrombosis of unspecified deep veins of unspecified lower extremity: Secondary | ICD-10-CM | POA: Diagnosis not present

## 2016-02-25 DIAGNOSIS — D631 Anemia in chronic kidney disease: Secondary | ICD-10-CM | POA: Diagnosis not present

## 2016-02-25 DIAGNOSIS — N186 End stage renal disease: Secondary | ICD-10-CM | POA: Diagnosis not present

## 2016-02-25 DIAGNOSIS — N2581 Secondary hyperparathyroidism of renal origin: Secondary | ICD-10-CM | POA: Diagnosis not present

## 2016-02-27 DIAGNOSIS — N2581 Secondary hyperparathyroidism of renal origin: Secondary | ICD-10-CM | POA: Diagnosis not present

## 2016-02-27 DIAGNOSIS — D509 Iron deficiency anemia, unspecified: Secondary | ICD-10-CM | POA: Diagnosis not present

## 2016-02-27 DIAGNOSIS — D631 Anemia in chronic kidney disease: Secondary | ICD-10-CM | POA: Diagnosis not present

## 2016-02-27 DIAGNOSIS — N186 End stage renal disease: Secondary | ICD-10-CM | POA: Diagnosis not present

## 2016-02-29 DIAGNOSIS — D509 Iron deficiency anemia, unspecified: Secondary | ICD-10-CM | POA: Diagnosis not present

## 2016-02-29 DIAGNOSIS — N186 End stage renal disease: Secondary | ICD-10-CM | POA: Diagnosis not present

## 2016-02-29 DIAGNOSIS — N2581 Secondary hyperparathyroidism of renal origin: Secondary | ICD-10-CM | POA: Diagnosis not present

## 2016-02-29 DIAGNOSIS — D631 Anemia in chronic kidney disease: Secondary | ICD-10-CM | POA: Diagnosis not present

## 2016-03-03 DIAGNOSIS — N2581 Secondary hyperparathyroidism of renal origin: Secondary | ICD-10-CM | POA: Diagnosis not present

## 2016-03-03 DIAGNOSIS — N186 End stage renal disease: Secondary | ICD-10-CM | POA: Diagnosis not present

## 2016-03-03 DIAGNOSIS — D509 Iron deficiency anemia, unspecified: Secondary | ICD-10-CM | POA: Diagnosis not present

## 2016-03-03 DIAGNOSIS — I82409 Acute embolism and thrombosis of unspecified deep veins of unspecified lower extremity: Secondary | ICD-10-CM | POA: Diagnosis not present

## 2016-03-03 DIAGNOSIS — D631 Anemia in chronic kidney disease: Secondary | ICD-10-CM | POA: Diagnosis not present

## 2016-03-05 DIAGNOSIS — D509 Iron deficiency anemia, unspecified: Secondary | ICD-10-CM | POA: Diagnosis not present

## 2016-03-05 DIAGNOSIS — Z992 Dependence on renal dialysis: Secondary | ICD-10-CM | POA: Diagnosis not present

## 2016-03-05 DIAGNOSIS — N2581 Secondary hyperparathyroidism of renal origin: Secondary | ICD-10-CM | POA: Diagnosis not present

## 2016-03-05 DIAGNOSIS — N186 End stage renal disease: Secondary | ICD-10-CM | POA: Diagnosis not present

## 2016-03-05 DIAGNOSIS — D631 Anemia in chronic kidney disease: Secondary | ICD-10-CM | POA: Diagnosis not present

## 2016-03-07 DIAGNOSIS — N2581 Secondary hyperparathyroidism of renal origin: Secondary | ICD-10-CM | POA: Diagnosis not present

## 2016-03-07 DIAGNOSIS — N186 End stage renal disease: Secondary | ICD-10-CM | POA: Diagnosis not present

## 2016-03-07 DIAGNOSIS — D509 Iron deficiency anemia, unspecified: Secondary | ICD-10-CM | POA: Diagnosis not present

## 2016-03-10 DIAGNOSIS — I82409 Acute embolism and thrombosis of unspecified deep veins of unspecified lower extremity: Secondary | ICD-10-CM | POA: Diagnosis not present

## 2016-03-10 DIAGNOSIS — D509 Iron deficiency anemia, unspecified: Secondary | ICD-10-CM | POA: Diagnosis not present

## 2016-03-10 DIAGNOSIS — N186 End stage renal disease: Secondary | ICD-10-CM | POA: Diagnosis not present

## 2016-03-10 DIAGNOSIS — N2581 Secondary hyperparathyroidism of renal origin: Secondary | ICD-10-CM | POA: Diagnosis not present

## 2016-03-12 DIAGNOSIS — N2581 Secondary hyperparathyroidism of renal origin: Secondary | ICD-10-CM | POA: Diagnosis not present

## 2016-03-12 DIAGNOSIS — N186 End stage renal disease: Secondary | ICD-10-CM | POA: Diagnosis not present

## 2016-03-12 DIAGNOSIS — D509 Iron deficiency anemia, unspecified: Secondary | ICD-10-CM | POA: Diagnosis not present

## 2016-03-12 DIAGNOSIS — E1139 Type 2 diabetes mellitus with other diabetic ophthalmic complication: Secondary | ICD-10-CM | POA: Diagnosis not present

## 2016-03-14 DIAGNOSIS — D509 Iron deficiency anemia, unspecified: Secondary | ICD-10-CM | POA: Diagnosis not present

## 2016-03-14 DIAGNOSIS — N2581 Secondary hyperparathyroidism of renal origin: Secondary | ICD-10-CM | POA: Diagnosis not present

## 2016-03-14 DIAGNOSIS — N186 End stage renal disease: Secondary | ICD-10-CM | POA: Diagnosis not present

## 2016-03-17 DIAGNOSIS — D631 Anemia in chronic kidney disease: Secondary | ICD-10-CM | POA: Diagnosis not present

## 2016-03-17 DIAGNOSIS — N2581 Secondary hyperparathyroidism of renal origin: Secondary | ICD-10-CM | POA: Diagnosis not present

## 2016-03-17 DIAGNOSIS — I82409 Acute embolism and thrombosis of unspecified deep veins of unspecified lower extremity: Secondary | ICD-10-CM | POA: Diagnosis not present

## 2016-03-17 DIAGNOSIS — N186 End stage renal disease: Secondary | ICD-10-CM | POA: Diagnosis not present

## 2016-03-19 DIAGNOSIS — N186 End stage renal disease: Secondary | ICD-10-CM | POA: Diagnosis not present

## 2016-03-19 DIAGNOSIS — N2581 Secondary hyperparathyroidism of renal origin: Secondary | ICD-10-CM | POA: Diagnosis not present

## 2016-03-19 DIAGNOSIS — D631 Anemia in chronic kidney disease: Secondary | ICD-10-CM | POA: Diagnosis not present

## 2016-03-21 DIAGNOSIS — D631 Anemia in chronic kidney disease: Secondary | ICD-10-CM | POA: Diagnosis not present

## 2016-03-21 DIAGNOSIS — N2581 Secondary hyperparathyroidism of renal origin: Secondary | ICD-10-CM | POA: Diagnosis not present

## 2016-03-21 DIAGNOSIS — N186 End stage renal disease: Secondary | ICD-10-CM | POA: Diagnosis not present

## 2016-03-24 DIAGNOSIS — N2581 Secondary hyperparathyroidism of renal origin: Secondary | ICD-10-CM | POA: Diagnosis not present

## 2016-03-24 DIAGNOSIS — D631 Anemia in chronic kidney disease: Secondary | ICD-10-CM | POA: Diagnosis not present

## 2016-03-24 DIAGNOSIS — I82409 Acute embolism and thrombosis of unspecified deep veins of unspecified lower extremity: Secondary | ICD-10-CM | POA: Diagnosis not present

## 2016-03-24 DIAGNOSIS — N186 End stage renal disease: Secondary | ICD-10-CM | POA: Diagnosis not present

## 2016-03-25 ENCOUNTER — Other Ambulatory Visit: Payer: Self-pay | Admitting: Family Medicine

## 2016-03-26 DIAGNOSIS — N2581 Secondary hyperparathyroidism of renal origin: Secondary | ICD-10-CM | POA: Diagnosis not present

## 2016-03-26 DIAGNOSIS — D631 Anemia in chronic kidney disease: Secondary | ICD-10-CM | POA: Diagnosis not present

## 2016-03-26 DIAGNOSIS — N186 End stage renal disease: Secondary | ICD-10-CM | POA: Diagnosis not present

## 2016-03-28 DIAGNOSIS — D631 Anemia in chronic kidney disease: Secondary | ICD-10-CM | POA: Diagnosis not present

## 2016-03-28 DIAGNOSIS — N186 End stage renal disease: Secondary | ICD-10-CM | POA: Diagnosis not present

## 2016-03-28 DIAGNOSIS — N2581 Secondary hyperparathyroidism of renal origin: Secondary | ICD-10-CM | POA: Diagnosis not present

## 2016-03-31 DIAGNOSIS — I82409 Acute embolism and thrombosis of unspecified deep veins of unspecified lower extremity: Secondary | ICD-10-CM | POA: Diagnosis not present

## 2016-03-31 DIAGNOSIS — D631 Anemia in chronic kidney disease: Secondary | ICD-10-CM | POA: Diagnosis not present

## 2016-03-31 DIAGNOSIS — N186 End stage renal disease: Secondary | ICD-10-CM | POA: Diagnosis not present

## 2016-03-31 DIAGNOSIS — N2581 Secondary hyperparathyroidism of renal origin: Secondary | ICD-10-CM | POA: Diagnosis not present

## 2016-04-02 DIAGNOSIS — D631 Anemia in chronic kidney disease: Secondary | ICD-10-CM | POA: Diagnosis not present

## 2016-04-02 DIAGNOSIS — N186 End stage renal disease: Secondary | ICD-10-CM | POA: Diagnosis not present

## 2016-04-02 DIAGNOSIS — N2581 Secondary hyperparathyroidism of renal origin: Secondary | ICD-10-CM | POA: Diagnosis not present

## 2016-04-04 DIAGNOSIS — D631 Anemia in chronic kidney disease: Secondary | ICD-10-CM | POA: Diagnosis not present

## 2016-04-04 DIAGNOSIS — N186 End stage renal disease: Secondary | ICD-10-CM | POA: Diagnosis not present

## 2016-04-04 DIAGNOSIS — N2581 Secondary hyperparathyroidism of renal origin: Secondary | ICD-10-CM | POA: Diagnosis not present

## 2016-04-05 DIAGNOSIS — N186 End stage renal disease: Secondary | ICD-10-CM | POA: Diagnosis not present

## 2016-04-05 DIAGNOSIS — Z992 Dependence on renal dialysis: Secondary | ICD-10-CM | POA: Diagnosis not present

## 2016-04-07 DIAGNOSIS — N186 End stage renal disease: Secondary | ICD-10-CM | POA: Diagnosis not present

## 2016-04-07 DIAGNOSIS — N2581 Secondary hyperparathyroidism of renal origin: Secondary | ICD-10-CM | POA: Diagnosis not present

## 2016-04-07 DIAGNOSIS — D509 Iron deficiency anemia, unspecified: Secondary | ICD-10-CM | POA: Diagnosis not present

## 2016-04-07 DIAGNOSIS — I82409 Acute embolism and thrombosis of unspecified deep veins of unspecified lower extremity: Secondary | ICD-10-CM | POA: Diagnosis not present

## 2016-04-09 DIAGNOSIS — E119 Type 2 diabetes mellitus without complications: Secondary | ICD-10-CM | POA: Diagnosis not present

## 2016-04-09 DIAGNOSIS — N2581 Secondary hyperparathyroidism of renal origin: Secondary | ICD-10-CM | POA: Diagnosis not present

## 2016-04-09 DIAGNOSIS — D509 Iron deficiency anemia, unspecified: Secondary | ICD-10-CM | POA: Diagnosis not present

## 2016-04-09 DIAGNOSIS — N186 End stage renal disease: Secondary | ICD-10-CM | POA: Diagnosis not present

## 2016-04-11 DIAGNOSIS — N2581 Secondary hyperparathyroidism of renal origin: Secondary | ICD-10-CM | POA: Diagnosis not present

## 2016-04-11 DIAGNOSIS — N186 End stage renal disease: Secondary | ICD-10-CM | POA: Diagnosis not present

## 2016-04-11 DIAGNOSIS — D509 Iron deficiency anemia, unspecified: Secondary | ICD-10-CM | POA: Diagnosis not present

## 2016-04-14 DIAGNOSIS — N2581 Secondary hyperparathyroidism of renal origin: Secondary | ICD-10-CM | POA: Diagnosis not present

## 2016-04-14 DIAGNOSIS — I82409 Acute embolism and thrombosis of unspecified deep veins of unspecified lower extremity: Secondary | ICD-10-CM | POA: Diagnosis not present

## 2016-04-14 DIAGNOSIS — N186 End stage renal disease: Secondary | ICD-10-CM | POA: Diagnosis not present

## 2016-04-14 DIAGNOSIS — D509 Iron deficiency anemia, unspecified: Secondary | ICD-10-CM | POA: Diagnosis not present

## 2016-04-16 DIAGNOSIS — D631 Anemia in chronic kidney disease: Secondary | ICD-10-CM | POA: Diagnosis not present

## 2016-04-16 DIAGNOSIS — N186 End stage renal disease: Secondary | ICD-10-CM | POA: Diagnosis not present

## 2016-04-16 DIAGNOSIS — N2581 Secondary hyperparathyroidism of renal origin: Secondary | ICD-10-CM | POA: Diagnosis not present

## 2016-04-18 DIAGNOSIS — N186 End stage renal disease: Secondary | ICD-10-CM | POA: Diagnosis not present

## 2016-04-18 DIAGNOSIS — N2581 Secondary hyperparathyroidism of renal origin: Secondary | ICD-10-CM | POA: Diagnosis not present

## 2016-04-18 DIAGNOSIS — D631 Anemia in chronic kidney disease: Secondary | ICD-10-CM | POA: Diagnosis not present

## 2016-04-21 DIAGNOSIS — N2581 Secondary hyperparathyroidism of renal origin: Secondary | ICD-10-CM | POA: Diagnosis not present

## 2016-04-21 DIAGNOSIS — D631 Anemia in chronic kidney disease: Secondary | ICD-10-CM | POA: Diagnosis not present

## 2016-04-21 DIAGNOSIS — I82409 Acute embolism and thrombosis of unspecified deep veins of unspecified lower extremity: Secondary | ICD-10-CM | POA: Diagnosis not present

## 2016-04-21 DIAGNOSIS — N186 End stage renal disease: Secondary | ICD-10-CM | POA: Diagnosis not present

## 2016-04-23 DIAGNOSIS — N186 End stage renal disease: Secondary | ICD-10-CM | POA: Diagnosis not present

## 2016-04-23 DIAGNOSIS — D631 Anemia in chronic kidney disease: Secondary | ICD-10-CM | POA: Diagnosis not present

## 2016-04-23 DIAGNOSIS — N2581 Secondary hyperparathyroidism of renal origin: Secondary | ICD-10-CM | POA: Diagnosis not present

## 2016-04-25 DIAGNOSIS — N2581 Secondary hyperparathyroidism of renal origin: Secondary | ICD-10-CM | POA: Diagnosis not present

## 2016-04-25 DIAGNOSIS — N186 End stage renal disease: Secondary | ICD-10-CM | POA: Diagnosis not present

## 2016-04-25 DIAGNOSIS — D631 Anemia in chronic kidney disease: Secondary | ICD-10-CM | POA: Diagnosis not present

## 2016-04-28 DIAGNOSIS — I82409 Acute embolism and thrombosis of unspecified deep veins of unspecified lower extremity: Secondary | ICD-10-CM | POA: Diagnosis not present

## 2016-04-28 DIAGNOSIS — D631 Anemia in chronic kidney disease: Secondary | ICD-10-CM | POA: Diagnosis not present

## 2016-04-28 DIAGNOSIS — N2581 Secondary hyperparathyroidism of renal origin: Secondary | ICD-10-CM | POA: Diagnosis not present

## 2016-04-28 DIAGNOSIS — N186 End stage renal disease: Secondary | ICD-10-CM | POA: Diagnosis not present

## 2016-04-30 DIAGNOSIS — N2581 Secondary hyperparathyroidism of renal origin: Secondary | ICD-10-CM | POA: Diagnosis not present

## 2016-04-30 DIAGNOSIS — D631 Anemia in chronic kidney disease: Secondary | ICD-10-CM | POA: Diagnosis not present

## 2016-04-30 DIAGNOSIS — N186 End stage renal disease: Secondary | ICD-10-CM | POA: Diagnosis not present

## 2016-05-02 DIAGNOSIS — N2581 Secondary hyperparathyroidism of renal origin: Secondary | ICD-10-CM | POA: Diagnosis not present

## 2016-05-02 DIAGNOSIS — D631 Anemia in chronic kidney disease: Secondary | ICD-10-CM | POA: Diagnosis not present

## 2016-05-02 DIAGNOSIS — N186 End stage renal disease: Secondary | ICD-10-CM | POA: Diagnosis not present

## 2016-05-05 DIAGNOSIS — D631 Anemia in chronic kidney disease: Secondary | ICD-10-CM | POA: Diagnosis not present

## 2016-05-05 DIAGNOSIS — I82409 Acute embolism and thrombosis of unspecified deep veins of unspecified lower extremity: Secondary | ICD-10-CM | POA: Diagnosis not present

## 2016-05-05 DIAGNOSIS — N2581 Secondary hyperparathyroidism of renal origin: Secondary | ICD-10-CM | POA: Diagnosis not present

## 2016-05-05 DIAGNOSIS — N186 End stage renal disease: Secondary | ICD-10-CM | POA: Diagnosis not present

## 2016-05-05 DIAGNOSIS — Z992 Dependence on renal dialysis: Secondary | ICD-10-CM | POA: Diagnosis not present

## 2016-05-07 DIAGNOSIS — N2581 Secondary hyperparathyroidism of renal origin: Secondary | ICD-10-CM | POA: Diagnosis not present

## 2016-05-07 DIAGNOSIS — D509 Iron deficiency anemia, unspecified: Secondary | ICD-10-CM | POA: Diagnosis not present

## 2016-05-07 DIAGNOSIS — E1139 Type 2 diabetes mellitus with other diabetic ophthalmic complication: Secondary | ICD-10-CM | POA: Diagnosis not present

## 2016-05-07 DIAGNOSIS — N186 End stage renal disease: Secondary | ICD-10-CM | POA: Diagnosis not present

## 2016-05-07 DIAGNOSIS — D631 Anemia in chronic kidney disease: Secondary | ICD-10-CM | POA: Diagnosis not present

## 2016-05-09 DIAGNOSIS — D509 Iron deficiency anemia, unspecified: Secondary | ICD-10-CM | POA: Diagnosis not present

## 2016-05-09 DIAGNOSIS — N2581 Secondary hyperparathyroidism of renal origin: Secondary | ICD-10-CM | POA: Diagnosis not present

## 2016-05-09 DIAGNOSIS — D631 Anemia in chronic kidney disease: Secondary | ICD-10-CM | POA: Diagnosis not present

## 2016-05-09 DIAGNOSIS — N186 End stage renal disease: Secondary | ICD-10-CM | POA: Diagnosis not present

## 2016-05-12 DIAGNOSIS — I82409 Acute embolism and thrombosis of unspecified deep veins of unspecified lower extremity: Secondary | ICD-10-CM | POA: Diagnosis not present

## 2016-05-12 DIAGNOSIS — D509 Iron deficiency anemia, unspecified: Secondary | ICD-10-CM | POA: Diagnosis not present

## 2016-05-12 DIAGNOSIS — N2581 Secondary hyperparathyroidism of renal origin: Secondary | ICD-10-CM | POA: Diagnosis not present

## 2016-05-12 DIAGNOSIS — N186 End stage renal disease: Secondary | ICD-10-CM | POA: Diagnosis not present

## 2016-05-12 DIAGNOSIS — D631 Anemia in chronic kidney disease: Secondary | ICD-10-CM | POA: Diagnosis not present

## 2016-05-14 DIAGNOSIS — N2581 Secondary hyperparathyroidism of renal origin: Secondary | ICD-10-CM | POA: Diagnosis not present

## 2016-05-14 DIAGNOSIS — D631 Anemia in chronic kidney disease: Secondary | ICD-10-CM | POA: Diagnosis not present

## 2016-05-14 DIAGNOSIS — N186 End stage renal disease: Secondary | ICD-10-CM | POA: Diagnosis not present

## 2016-05-14 DIAGNOSIS — D509 Iron deficiency anemia, unspecified: Secondary | ICD-10-CM | POA: Diagnosis not present

## 2016-05-16 DIAGNOSIS — N2581 Secondary hyperparathyroidism of renal origin: Secondary | ICD-10-CM | POA: Diagnosis not present

## 2016-05-16 DIAGNOSIS — N186 End stage renal disease: Secondary | ICD-10-CM | POA: Diagnosis not present

## 2016-05-19 DIAGNOSIS — N2581 Secondary hyperparathyroidism of renal origin: Secondary | ICD-10-CM | POA: Diagnosis not present

## 2016-05-19 DIAGNOSIS — I82409 Acute embolism and thrombosis of unspecified deep veins of unspecified lower extremity: Secondary | ICD-10-CM | POA: Diagnosis not present

## 2016-05-19 DIAGNOSIS — N186 End stage renal disease: Secondary | ICD-10-CM | POA: Diagnosis not present

## 2016-05-21 DIAGNOSIS — N186 End stage renal disease: Secondary | ICD-10-CM | POA: Diagnosis not present

## 2016-05-21 DIAGNOSIS — N2581 Secondary hyperparathyroidism of renal origin: Secondary | ICD-10-CM | POA: Diagnosis not present

## 2016-05-23 DIAGNOSIS — N186 End stage renal disease: Secondary | ICD-10-CM | POA: Diagnosis not present

## 2016-05-23 DIAGNOSIS — N2581 Secondary hyperparathyroidism of renal origin: Secondary | ICD-10-CM | POA: Diagnosis not present

## 2016-05-26 DIAGNOSIS — N186 End stage renal disease: Secondary | ICD-10-CM | POA: Diagnosis not present

## 2016-05-26 DIAGNOSIS — D631 Anemia in chronic kidney disease: Secondary | ICD-10-CM | POA: Diagnosis not present

## 2016-05-26 DIAGNOSIS — I82409 Acute embolism and thrombosis of unspecified deep veins of unspecified lower extremity: Secondary | ICD-10-CM | POA: Diagnosis not present

## 2016-05-26 DIAGNOSIS — N2581 Secondary hyperparathyroidism of renal origin: Secondary | ICD-10-CM | POA: Diagnosis not present

## 2016-05-28 DIAGNOSIS — N186 End stage renal disease: Secondary | ICD-10-CM | POA: Diagnosis not present

## 2016-05-28 DIAGNOSIS — D631 Anemia in chronic kidney disease: Secondary | ICD-10-CM | POA: Diagnosis not present

## 2016-05-28 DIAGNOSIS — N2581 Secondary hyperparathyroidism of renal origin: Secondary | ICD-10-CM | POA: Diagnosis not present

## 2016-05-30 DIAGNOSIS — D631 Anemia in chronic kidney disease: Secondary | ICD-10-CM | POA: Diagnosis not present

## 2016-05-30 DIAGNOSIS — N2581 Secondary hyperparathyroidism of renal origin: Secondary | ICD-10-CM | POA: Diagnosis not present

## 2016-05-30 DIAGNOSIS — N186 End stage renal disease: Secondary | ICD-10-CM | POA: Diagnosis not present

## 2016-06-02 DIAGNOSIS — D631 Anemia in chronic kidney disease: Secondary | ICD-10-CM | POA: Diagnosis not present

## 2016-06-02 DIAGNOSIS — N186 End stage renal disease: Secondary | ICD-10-CM | POA: Diagnosis not present

## 2016-06-02 DIAGNOSIS — I82409 Acute embolism and thrombosis of unspecified deep veins of unspecified lower extremity: Secondary | ICD-10-CM | POA: Diagnosis not present

## 2016-06-02 DIAGNOSIS — N2581 Secondary hyperparathyroidism of renal origin: Secondary | ICD-10-CM | POA: Diagnosis not present

## 2016-06-04 DIAGNOSIS — N186 End stage renal disease: Secondary | ICD-10-CM | POA: Diagnosis not present

## 2016-06-04 DIAGNOSIS — D631 Anemia in chronic kidney disease: Secondary | ICD-10-CM | POA: Diagnosis not present

## 2016-06-04 DIAGNOSIS — N2581 Secondary hyperparathyroidism of renal origin: Secondary | ICD-10-CM | POA: Diagnosis not present

## 2016-06-05 ENCOUNTER — Telehealth: Payer: Self-pay | Admitting: Family Medicine

## 2016-06-05 DIAGNOSIS — Z992 Dependence on renal dialysis: Secondary | ICD-10-CM | POA: Diagnosis not present

## 2016-06-05 DIAGNOSIS — N186 End stage renal disease: Secondary | ICD-10-CM | POA: Diagnosis not present

## 2016-06-05 NOTE — Telephone Encounter (Signed)
Please call her nephrologist or dialysis center to get a copy of her last hemoglobin A1c as well as her last cholesterol

## 2016-06-06 DIAGNOSIS — D509 Iron deficiency anemia, unspecified: Secondary | ICD-10-CM | POA: Diagnosis not present

## 2016-06-06 DIAGNOSIS — N2581 Secondary hyperparathyroidism of renal origin: Secondary | ICD-10-CM | POA: Diagnosis not present

## 2016-06-06 DIAGNOSIS — D631 Anemia in chronic kidney disease: Secondary | ICD-10-CM | POA: Diagnosis not present

## 2016-06-06 DIAGNOSIS — N186 End stage renal disease: Secondary | ICD-10-CM | POA: Diagnosis not present

## 2016-06-09 DIAGNOSIS — N186 End stage renal disease: Secondary | ICD-10-CM | POA: Diagnosis not present

## 2016-06-09 DIAGNOSIS — D509 Iron deficiency anemia, unspecified: Secondary | ICD-10-CM | POA: Diagnosis not present

## 2016-06-09 DIAGNOSIS — I82409 Acute embolism and thrombosis of unspecified deep veins of unspecified lower extremity: Secondary | ICD-10-CM | POA: Diagnosis not present

## 2016-06-09 DIAGNOSIS — N2581 Secondary hyperparathyroidism of renal origin: Secondary | ICD-10-CM | POA: Diagnosis not present

## 2016-06-09 DIAGNOSIS — D631 Anemia in chronic kidney disease: Secondary | ICD-10-CM | POA: Diagnosis not present

## 2016-06-10 DIAGNOSIS — I7300 Raynaud's syndrome without gangrene: Secondary | ICD-10-CM | POA: Diagnosis not present

## 2016-06-10 DIAGNOSIS — R768 Other specified abnormal immunological findings in serum: Secondary | ICD-10-CM | POA: Diagnosis not present

## 2016-06-10 DIAGNOSIS — N186 End stage renal disease: Secondary | ICD-10-CM | POA: Diagnosis not present

## 2016-06-10 DIAGNOSIS — M329 Systemic lupus erythematosus, unspecified: Secondary | ICD-10-CM | POA: Diagnosis not present

## 2016-06-11 DIAGNOSIS — N186 End stage renal disease: Secondary | ICD-10-CM | POA: Diagnosis not present

## 2016-06-11 DIAGNOSIS — E1139 Type 2 diabetes mellitus with other diabetic ophthalmic complication: Secondary | ICD-10-CM | POA: Diagnosis not present

## 2016-06-11 DIAGNOSIS — D631 Anemia in chronic kidney disease: Secondary | ICD-10-CM | POA: Diagnosis not present

## 2016-06-11 DIAGNOSIS — D509 Iron deficiency anemia, unspecified: Secondary | ICD-10-CM | POA: Diagnosis not present

## 2016-06-11 DIAGNOSIS — N2581 Secondary hyperparathyroidism of renal origin: Secondary | ICD-10-CM | POA: Diagnosis not present

## 2016-06-12 NOTE — Telephone Encounter (Signed)
I called Dr.Hadley's office and left a Voicemail requesting they fax Dr.Metheney the patients last Hemoglobin A1c and Cholesterol to [phone] Att: Dr.Metheney

## 2016-06-13 DIAGNOSIS — N2581 Secondary hyperparathyroidism of renal origin: Secondary | ICD-10-CM | POA: Diagnosis not present

## 2016-06-13 DIAGNOSIS — N186 End stage renal disease: Secondary | ICD-10-CM | POA: Diagnosis not present

## 2016-06-13 DIAGNOSIS — D509 Iron deficiency anemia, unspecified: Secondary | ICD-10-CM | POA: Diagnosis not present

## 2016-06-13 DIAGNOSIS — D631 Anemia in chronic kidney disease: Secondary | ICD-10-CM | POA: Diagnosis not present

## 2016-06-16 DIAGNOSIS — I82409 Acute embolism and thrombosis of unspecified deep veins of unspecified lower extremity: Secondary | ICD-10-CM | POA: Diagnosis not present

## 2016-06-16 DIAGNOSIS — D631 Anemia in chronic kidney disease: Secondary | ICD-10-CM | POA: Diagnosis not present

## 2016-06-16 DIAGNOSIS — N2581 Secondary hyperparathyroidism of renal origin: Secondary | ICD-10-CM | POA: Diagnosis not present

## 2016-06-16 DIAGNOSIS — N186 End stage renal disease: Secondary | ICD-10-CM | POA: Diagnosis not present

## 2016-06-18 DIAGNOSIS — N186 End stage renal disease: Secondary | ICD-10-CM | POA: Diagnosis not present

## 2016-06-18 DIAGNOSIS — D631 Anemia in chronic kidney disease: Secondary | ICD-10-CM | POA: Diagnosis not present

## 2016-06-18 DIAGNOSIS — N2581 Secondary hyperparathyroidism of renal origin: Secondary | ICD-10-CM | POA: Diagnosis not present

## 2016-06-20 DIAGNOSIS — N186 End stage renal disease: Secondary | ICD-10-CM | POA: Diagnosis not present

## 2016-06-20 DIAGNOSIS — N2581 Secondary hyperparathyroidism of renal origin: Secondary | ICD-10-CM | POA: Diagnosis not present

## 2016-06-20 DIAGNOSIS — D631 Anemia in chronic kidney disease: Secondary | ICD-10-CM | POA: Diagnosis not present

## 2016-06-23 DIAGNOSIS — N2581 Secondary hyperparathyroidism of renal origin: Secondary | ICD-10-CM | POA: Diagnosis not present

## 2016-06-23 DIAGNOSIS — I82409 Acute embolism and thrombosis of unspecified deep veins of unspecified lower extremity: Secondary | ICD-10-CM | POA: Diagnosis not present

## 2016-06-23 DIAGNOSIS — D631 Anemia in chronic kidney disease: Secondary | ICD-10-CM | POA: Diagnosis not present

## 2016-06-23 DIAGNOSIS — N186 End stage renal disease: Secondary | ICD-10-CM | POA: Diagnosis not present

## 2016-06-25 DIAGNOSIS — N186 End stage renal disease: Secondary | ICD-10-CM | POA: Diagnosis not present

## 2016-06-25 DIAGNOSIS — D631 Anemia in chronic kidney disease: Secondary | ICD-10-CM | POA: Diagnosis not present

## 2016-06-25 DIAGNOSIS — N2581 Secondary hyperparathyroidism of renal origin: Secondary | ICD-10-CM | POA: Diagnosis not present

## 2016-06-27 DIAGNOSIS — N2581 Secondary hyperparathyroidism of renal origin: Secondary | ICD-10-CM | POA: Diagnosis not present

## 2016-06-27 DIAGNOSIS — N186 End stage renal disease: Secondary | ICD-10-CM | POA: Diagnosis not present

## 2016-06-30 DIAGNOSIS — I82409 Acute embolism and thrombosis of unspecified deep veins of unspecified lower extremity: Secondary | ICD-10-CM | POA: Diagnosis not present

## 2016-06-30 DIAGNOSIS — N186 End stage renal disease: Secondary | ICD-10-CM | POA: Diagnosis not present

## 2016-06-30 DIAGNOSIS — N2581 Secondary hyperparathyroidism of renal origin: Secondary | ICD-10-CM | POA: Diagnosis not present

## 2016-07-02 DIAGNOSIS — N2581 Secondary hyperparathyroidism of renal origin: Secondary | ICD-10-CM | POA: Diagnosis not present

## 2016-07-02 DIAGNOSIS — N186 End stage renal disease: Secondary | ICD-10-CM | POA: Diagnosis not present

## 2016-07-03 ENCOUNTER — Other Ambulatory Visit: Payer: Self-pay | Admitting: Family Medicine

## 2016-07-04 DIAGNOSIS — N2581 Secondary hyperparathyroidism of renal origin: Secondary | ICD-10-CM | POA: Diagnosis not present

## 2016-07-04 DIAGNOSIS — N186 End stage renal disease: Secondary | ICD-10-CM | POA: Diagnosis not present

## 2016-07-05 DIAGNOSIS — N186 End stage renal disease: Secondary | ICD-10-CM | POA: Diagnosis not present

## 2016-07-05 DIAGNOSIS — Z992 Dependence on renal dialysis: Secondary | ICD-10-CM | POA: Diagnosis not present

## 2016-07-07 DIAGNOSIS — D509 Iron deficiency anemia, unspecified: Secondary | ICD-10-CM | POA: Diagnosis not present

## 2016-07-07 DIAGNOSIS — N2581 Secondary hyperparathyroidism of renal origin: Secondary | ICD-10-CM | POA: Diagnosis not present

## 2016-07-07 DIAGNOSIS — I82409 Acute embolism and thrombosis of unspecified deep veins of unspecified lower extremity: Secondary | ICD-10-CM | POA: Diagnosis not present

## 2016-07-07 DIAGNOSIS — N186 End stage renal disease: Secondary | ICD-10-CM | POA: Diagnosis not present

## 2016-07-07 DIAGNOSIS — D631 Anemia in chronic kidney disease: Secondary | ICD-10-CM | POA: Diagnosis not present

## 2016-07-09 DIAGNOSIS — N186 End stage renal disease: Secondary | ICD-10-CM | POA: Diagnosis not present

## 2016-07-09 DIAGNOSIS — N2581 Secondary hyperparathyroidism of renal origin: Secondary | ICD-10-CM | POA: Diagnosis not present

## 2016-07-09 DIAGNOSIS — D631 Anemia in chronic kidney disease: Secondary | ICD-10-CM | POA: Diagnosis not present

## 2016-07-09 DIAGNOSIS — E1139 Type 2 diabetes mellitus with other diabetic ophthalmic complication: Secondary | ICD-10-CM | POA: Diagnosis not present

## 2016-07-09 DIAGNOSIS — D509 Iron deficiency anemia, unspecified: Secondary | ICD-10-CM | POA: Diagnosis not present

## 2016-07-11 DIAGNOSIS — H40013 Open angle with borderline findings, low risk, bilateral: Secondary | ICD-10-CM | POA: Diagnosis not present

## 2016-07-11 DIAGNOSIS — N186 End stage renal disease: Secondary | ICD-10-CM | POA: Diagnosis not present

## 2016-07-11 DIAGNOSIS — D631 Anemia in chronic kidney disease: Secondary | ICD-10-CM | POA: Diagnosis not present

## 2016-07-11 DIAGNOSIS — N2581 Secondary hyperparathyroidism of renal origin: Secondary | ICD-10-CM | POA: Diagnosis not present

## 2016-07-11 DIAGNOSIS — D509 Iron deficiency anemia, unspecified: Secondary | ICD-10-CM | POA: Diagnosis not present

## 2016-07-11 DIAGNOSIS — E119 Type 2 diabetes mellitus without complications: Secondary | ICD-10-CM | POA: Diagnosis not present

## 2016-07-11 DIAGNOSIS — H40019 Open angle with borderline findings, low risk, unspecified eye: Secondary | ICD-10-CM | POA: Diagnosis not present

## 2016-07-11 DIAGNOSIS — I10 Essential (primary) hypertension: Secondary | ICD-10-CM | POA: Diagnosis not present

## 2016-07-11 DIAGNOSIS — Z0100 Encounter for examination of eyes and vision without abnormal findings: Secondary | ICD-10-CM | POA: Diagnosis not present

## 2016-07-14 DIAGNOSIS — N186 End stage renal disease: Secondary | ICD-10-CM | POA: Diagnosis not present

## 2016-07-14 DIAGNOSIS — D509 Iron deficiency anemia, unspecified: Secondary | ICD-10-CM | POA: Diagnosis not present

## 2016-07-14 DIAGNOSIS — D631 Anemia in chronic kidney disease: Secondary | ICD-10-CM | POA: Diagnosis not present

## 2016-07-14 DIAGNOSIS — I82409 Acute embolism and thrombosis of unspecified deep veins of unspecified lower extremity: Secondary | ICD-10-CM | POA: Diagnosis not present

## 2016-07-14 DIAGNOSIS — N2581 Secondary hyperparathyroidism of renal origin: Secondary | ICD-10-CM | POA: Diagnosis not present

## 2016-07-16 DIAGNOSIS — N186 End stage renal disease: Secondary | ICD-10-CM | POA: Diagnosis not present

## 2016-07-16 DIAGNOSIS — D631 Anemia in chronic kidney disease: Secondary | ICD-10-CM | POA: Diagnosis not present

## 2016-07-16 DIAGNOSIS — N2581 Secondary hyperparathyroidism of renal origin: Secondary | ICD-10-CM | POA: Diagnosis not present

## 2016-07-18 DIAGNOSIS — N2581 Secondary hyperparathyroidism of renal origin: Secondary | ICD-10-CM | POA: Diagnosis not present

## 2016-07-18 DIAGNOSIS — N186 End stage renal disease: Secondary | ICD-10-CM | POA: Diagnosis not present

## 2016-07-18 DIAGNOSIS — D631 Anemia in chronic kidney disease: Secondary | ICD-10-CM | POA: Diagnosis not present

## 2016-07-21 DIAGNOSIS — N2581 Secondary hyperparathyroidism of renal origin: Secondary | ICD-10-CM | POA: Diagnosis not present

## 2016-07-21 DIAGNOSIS — D631 Anemia in chronic kidney disease: Secondary | ICD-10-CM | POA: Diagnosis not present

## 2016-07-21 DIAGNOSIS — N186 End stage renal disease: Secondary | ICD-10-CM | POA: Diagnosis not present

## 2016-07-21 DIAGNOSIS — I82409 Acute embolism and thrombosis of unspecified deep veins of unspecified lower extremity: Secondary | ICD-10-CM | POA: Diagnosis not present

## 2016-07-23 DIAGNOSIS — N186 End stage renal disease: Secondary | ICD-10-CM | POA: Diagnosis not present

## 2016-07-23 DIAGNOSIS — N2581 Secondary hyperparathyroidism of renal origin: Secondary | ICD-10-CM | POA: Diagnosis not present

## 2016-07-23 DIAGNOSIS — D631 Anemia in chronic kidney disease: Secondary | ICD-10-CM | POA: Diagnosis not present

## 2016-07-25 DIAGNOSIS — D631 Anemia in chronic kidney disease: Secondary | ICD-10-CM | POA: Diagnosis not present

## 2016-07-25 DIAGNOSIS — N2581 Secondary hyperparathyroidism of renal origin: Secondary | ICD-10-CM | POA: Diagnosis not present

## 2016-07-25 DIAGNOSIS — N186 End stage renal disease: Secondary | ICD-10-CM | POA: Diagnosis not present

## 2016-07-28 DIAGNOSIS — D631 Anemia in chronic kidney disease: Secondary | ICD-10-CM | POA: Diagnosis not present

## 2016-07-28 DIAGNOSIS — I82409 Acute embolism and thrombosis of unspecified deep veins of unspecified lower extremity: Secondary | ICD-10-CM | POA: Diagnosis not present

## 2016-07-28 DIAGNOSIS — N2581 Secondary hyperparathyroidism of renal origin: Secondary | ICD-10-CM | POA: Diagnosis not present

## 2016-07-28 DIAGNOSIS — N186 End stage renal disease: Secondary | ICD-10-CM | POA: Diagnosis not present

## 2016-07-30 DIAGNOSIS — N2581 Secondary hyperparathyroidism of renal origin: Secondary | ICD-10-CM | POA: Diagnosis not present

## 2016-07-30 DIAGNOSIS — N186 End stage renal disease: Secondary | ICD-10-CM | POA: Diagnosis not present

## 2016-07-30 DIAGNOSIS — D631 Anemia in chronic kidney disease: Secondary | ICD-10-CM | POA: Diagnosis not present

## 2016-08-01 DIAGNOSIS — N2581 Secondary hyperparathyroidism of renal origin: Secondary | ICD-10-CM | POA: Diagnosis not present

## 2016-08-01 DIAGNOSIS — N186 End stage renal disease: Secondary | ICD-10-CM | POA: Diagnosis not present

## 2016-08-01 DIAGNOSIS — D631 Anemia in chronic kidney disease: Secondary | ICD-10-CM | POA: Diagnosis not present

## 2016-08-04 DIAGNOSIS — N186 End stage renal disease: Secondary | ICD-10-CM | POA: Diagnosis not present

## 2016-08-04 DIAGNOSIS — D631 Anemia in chronic kidney disease: Secondary | ICD-10-CM | POA: Diagnosis not present

## 2016-08-04 DIAGNOSIS — I82409 Acute embolism and thrombosis of unspecified deep veins of unspecified lower extremity: Secondary | ICD-10-CM | POA: Diagnosis not present

## 2016-08-04 DIAGNOSIS — N2581 Secondary hyperparathyroidism of renal origin: Secondary | ICD-10-CM | POA: Diagnosis not present

## 2016-08-05 ENCOUNTER — Other Ambulatory Visit: Payer: Self-pay | Admitting: Family Medicine

## 2016-08-05 DIAGNOSIS — Z992 Dependence on renal dialysis: Secondary | ICD-10-CM | POA: Diagnosis not present

## 2016-08-05 DIAGNOSIS — N186 End stage renal disease: Secondary | ICD-10-CM | POA: Diagnosis not present

## 2016-08-06 DIAGNOSIS — N2581 Secondary hyperparathyroidism of renal origin: Secondary | ICD-10-CM | POA: Diagnosis not present

## 2016-08-06 DIAGNOSIS — E1139 Type 2 diabetes mellitus with other diabetic ophthalmic complication: Secondary | ICD-10-CM | POA: Diagnosis not present

## 2016-08-06 DIAGNOSIS — N186 End stage renal disease: Secondary | ICD-10-CM | POA: Diagnosis not present

## 2016-08-06 DIAGNOSIS — D631 Anemia in chronic kidney disease: Secondary | ICD-10-CM | POA: Diagnosis not present

## 2016-08-06 DIAGNOSIS — D509 Iron deficiency anemia, unspecified: Secondary | ICD-10-CM | POA: Diagnosis not present

## 2016-08-06 LAB — CALCIUM: Calcium: 9.6

## 2016-08-06 LAB — BASIC METABOLIC PANEL
Albumin: 4.5
BUN: 49 — AB (ref 4–21)
BUN: 5 (ref 4–21)
CO2: 24
CREATININE: 11.1 — AB (ref 0.5–1.1)
CREATININE: 11.1 — AB (ref 0.5–1.1)
Calcium: 9.6
Chloride: 99
FERRITIN: 767
GLUCOSE: 125
Iron: 51
POTASSIUM: 4 (ref 3.4–5.3)
Potassium: 4 (ref 3.4–5.3)
Sodium: 139 (ref 137–147)
Sodium: 139 (ref 137–147)

## 2016-08-06 LAB — HEPATITIS C ANTIBODY: Hepatitis C Ab: NEGATIVE

## 2016-08-06 LAB — CHLORIDE: Chloride: 99

## 2016-08-06 LAB — CBC AND DIFFERENTIAL
HCT: 33 — AB (ref 36–46)
Hemoglobin: 10.9 — AB (ref 12.0–16.0)
Platelets: 234 (ref 150–399)
WBC: 8.2
WBC: 8.2

## 2016-08-06 LAB — BASIC METABOLIC PANEL WITH GFR: BUN: 49 — AB (ref 4–21)

## 2016-08-06 LAB — HEPATIC FUNCTION PANEL
ALT: 29 (ref 7–35)
AST: 9 — AB (ref 13–35)

## 2016-08-06 LAB — ALBUMIN: Albumin: 4.5

## 2016-08-08 DIAGNOSIS — D631 Anemia in chronic kidney disease: Secondary | ICD-10-CM | POA: Diagnosis not present

## 2016-08-08 DIAGNOSIS — N2581 Secondary hyperparathyroidism of renal origin: Secondary | ICD-10-CM | POA: Diagnosis not present

## 2016-08-08 DIAGNOSIS — N186 End stage renal disease: Secondary | ICD-10-CM | POA: Diagnosis not present

## 2016-08-08 DIAGNOSIS — D509 Iron deficiency anemia, unspecified: Secondary | ICD-10-CM | POA: Diagnosis not present

## 2016-08-11 DIAGNOSIS — D509 Iron deficiency anemia, unspecified: Secondary | ICD-10-CM | POA: Diagnosis not present

## 2016-08-11 DIAGNOSIS — I82409 Acute embolism and thrombosis of unspecified deep veins of unspecified lower extremity: Secondary | ICD-10-CM | POA: Diagnosis not present

## 2016-08-11 DIAGNOSIS — D631 Anemia in chronic kidney disease: Secondary | ICD-10-CM | POA: Diagnosis not present

## 2016-08-11 DIAGNOSIS — N186 End stage renal disease: Secondary | ICD-10-CM | POA: Diagnosis not present

## 2016-08-11 DIAGNOSIS — N2581 Secondary hyperparathyroidism of renal origin: Secondary | ICD-10-CM | POA: Diagnosis not present

## 2016-08-13 DIAGNOSIS — N186 End stage renal disease: Secondary | ICD-10-CM | POA: Diagnosis not present

## 2016-08-13 DIAGNOSIS — N2581 Secondary hyperparathyroidism of renal origin: Secondary | ICD-10-CM | POA: Diagnosis not present

## 2016-08-13 DIAGNOSIS — D631 Anemia in chronic kidney disease: Secondary | ICD-10-CM | POA: Diagnosis not present

## 2016-08-13 DIAGNOSIS — D509 Iron deficiency anemia, unspecified: Secondary | ICD-10-CM | POA: Diagnosis not present

## 2016-08-15 DIAGNOSIS — D631 Anemia in chronic kidney disease: Secondary | ICD-10-CM | POA: Diagnosis not present

## 2016-08-15 DIAGNOSIS — D509 Iron deficiency anemia, unspecified: Secondary | ICD-10-CM | POA: Diagnosis not present

## 2016-08-15 DIAGNOSIS — N186 End stage renal disease: Secondary | ICD-10-CM | POA: Diagnosis not present

## 2016-08-15 DIAGNOSIS — N2581 Secondary hyperparathyroidism of renal origin: Secondary | ICD-10-CM | POA: Diagnosis not present

## 2016-08-18 DIAGNOSIS — I82409 Acute embolism and thrombosis of unspecified deep veins of unspecified lower extremity: Secondary | ICD-10-CM | POA: Diagnosis not present

## 2016-08-18 DIAGNOSIS — N2581 Secondary hyperparathyroidism of renal origin: Secondary | ICD-10-CM | POA: Diagnosis not present

## 2016-08-18 DIAGNOSIS — N186 End stage renal disease: Secondary | ICD-10-CM | POA: Diagnosis not present

## 2016-08-19 ENCOUNTER — Ambulatory Visit (INDEPENDENT_AMBULATORY_CARE_PROVIDER_SITE_OTHER): Payer: Medicare HMO | Admitting: Family Medicine

## 2016-08-19 ENCOUNTER — Encounter: Payer: Self-pay | Admitting: Family Medicine

## 2016-08-19 VITALS — BP 140/80 | HR 79 | Wt 154.0 lb

## 2016-08-19 DIAGNOSIS — Z6828 Body mass index (BMI) 28.0-28.9, adult: Secondary | ICD-10-CM

## 2016-08-19 DIAGNOSIS — M1610 Unilateral primary osteoarthritis, unspecified hip: Secondary | ICD-10-CM | POA: Diagnosis not present

## 2016-08-19 DIAGNOSIS — Z0000 Encounter for general adult medical examination without abnormal findings: Secondary | ICD-10-CM

## 2016-08-19 MED ORDER — HYDROCORTISONE 2.5 % EX CREA: 1.0000 "application " | TOPICAL_CREAM | Freq: Every day | CUTANEOUS | 1 refills | Status: DC | PRN

## 2016-08-19 MED ORDER — ATORVASTATIN CALCIUM 20 MG PO TABS: 20.0000 mg | ORAL_TABLET | Freq: Every day | ORAL | 3 refills | Status: AC

## 2016-08-19 MED ORDER — LIDOCAINE-HYDROCORTISONE ACE 2-2 % RE KIT: 1.0000 | PACK | Freq: Three times a day (TID) | RECTAL | 11 refills | Status: DC | PRN

## 2016-08-19 NOTE — Progress Notes (Signed)
Subjective:   Joy Anderson is a 66 y.o. female who presents for Medicare Annual (Subsequent) preventive examination.  Review of Systems:  Comprehensive review of systems is negative.       Objective:     Vitals: BP 140/80   Pulse 79   Wt 154 lb (69.9 kg)   LMP 03/18/1999   SpO2 100%   BMI 28.17 kg/m   Body mass index is 28.17 kg/m.   Tobacco History  Smoking Status  . Never Smoker  Smokeless Tobacco  . Never Used     Counseling given: Not Answered   Past Medical History:  Diagnosis Date  . Antiphospholipid antibody with hypercoagulable state ([place]) 08/27/2010  . DVT (deep venous thrombosis) ([facility])    anti phospholipid antibody + -- Dr Joy Anderson  . Hypertension   . OAB (overactive bladder)    off meds  . Post-menopausal   . Proteinuria    Dr Joy Anderson  . Raynaud's disease    Past Surgical History:  Procedure Laterality Date  . AV FISTULA PLACEMENT  2016  . CATARACT EXTRACTION, BILATERAL  2017  . TOTAL ABDOMINAL HYSTERECTOMY  2001   for fibroids w/ 1 oophorectomy   Family History  Problem Relation Age of Onset  . Alcohol abuse Father   . Colon cancer Brother   . Prostate cancer Brother   . Cerebral palsy Brother   . Diabetes Other    History  Sexual Activity  . Sexual activity: Not on file    Outpatient Encounter Prescriptions as of 08/19/2016  Medication Sig  . amLODipine (NORVASC) 10 MG tablet Take 5 mg by mouth daily.   Marland Kitchen atorvastatin (LIPITOR) 20 MG tablet Take 1 tablet (20 mg total) by mouth at bedtime. Due for follow up visit  . CALCIUM PO Take by mouth.  . cholecalciferol (VITAMIN D) 1000 units tablet Take 1,000 Units by mouth daily.  . cinacalcet (SENSIPAR) 30 MG tablet Take 30 mg by mouth daily.  Marland Kitchen HYDROcodone-acetaminophen (NORCO/VICODIN) 5-325 MG tablet take 1 tablet by mouth every 6 hours if needed for pain  . hydroxychloroquine (PLAQUENIL) 200 MG tablet Take 200 mg by mouth 2 (two) times daily.    . Lidocaine-Hydrocortisone Ace 2-2 %  KIT Place 1 Applicatorful rectally 3 (three) times daily as needed.  Marland Kitchen losartan (COZAAR) 100 MG tablet Take 100 mg by mouth daily.  . multivitamin (RENA-VIT) TABS tablet   . warfarin (COUMADIN) 1 MG tablet   . warfarin (COUMADIN) 5 MG tablet TAKE 1 TABLET BY MOUTH ON SUNDAY, WENESDAY, THURSDAY, FRIDAY, AND SATURDAY. THEN TAKE 1 AND 1/2 TABLET ON MONDAY AND TUESDAY  . [DISCONTINUED] atorvastatin (LIPITOR) 20 MG tablet Take 1 tablet (20 mg total) by mouth daily at 6 PM. Due for follow up visit  . [DISCONTINUED] Lidocaine-Hydrocortisone Ace 2-2 % KIT Place 1 Applicatorful rectally 3 (three) times daily as needed.  . hydrocortisone 2.5 % cream Apply 1 application topically daily as needed.   No facility-administered encounter medications on file as of 08/19/2016.     Activities of Daily Living No flowsheet data found.  Patient Care Team: Joy Marry, MD as PCP - General (Family Medicine) Joy Nip, MD as Referring Physician (Rheumatology) Joy Anderson. Nicole Kindred, MD (Nephrology) Dr. Haynes Anderson (Ophthalmology)    Assessment:      Exercise Activities and Dietary recommendations    Goals    None     Fall Risk Fall Risk  08/19/2016 08/16/2015 04/25/2014 10/28/2012 10/14/2012  Falls in  the past year? _0   Risk for fall due to : - Impaired mobility;Impaired balance/gait - - -   Depression Screen PHQ 2/9 Scores 08/19/2016 08/16/2015 08/16/2015 04/25/2014  PHQ - 2 Score 0 0 0 0     Cognitive Function     6CIT Screen 08/20/2016  What Year? 0 points  What month? 0 points  What time? 0 points  Count back from 20 0 points  Months in reverse 0 points  Repeat phrase 0 points  Total Score 0    Immunization History  Administered Date(s) Administered  . Hepatitis B, adult 10/25/2014, 11/24/2014, 05/14/2015  . Influenza Whole 12/12/2009  . Influenza,inj,Quad PF,36+ Mos 10/14/2012, 11/01/2013  . Influenza-Unspecified 10/25/2014  . Pneumococcal Conjugate-13  04/25/2014  . Pneumococcal Polysaccharide-23 12/12/2009  . Tdap 09/06/2004, 09/07/2014   Screening Tests Health Maintenance  Topic Date Due  . PNA vac Low Risk Adult (2 of 2 - PPSV23) 04/25/2015  . HEMOGLOBIN A1C  09/27/2015  . OPHTHALMOLOGY EXAM  11/02/2015  . FOOT EXAM  03/26/2016  . INFLUENZA VACCINE  08/06/2016  . MAMMOGRAM  09/18/2017  . DEXA SCAN  09/18/2017  . COLONOSCOPY  04/15/2018  . TETANUS/TDAP  09/06/2024  . Hepatitis C Screening  Completed      Plan:    [place]ness Exam    I have personally reviewed and noted the following in the patient's chart:   . Medical and social history . Use of alcohol, tobacco or illicit drugs  - none . Current medications and supplements - updated.   . Functional ability and status . Nutritional status . Physical activity . Advanced directives . List of other physicians . Hospitalizations, surgeries, and ER visits in previous 12 months . Vitals . Screenings to include cognitive, depression, and falls - documented. Low risk   . Referrals and appointments . She did ask that we check her coumadin today and we send the results to her Nephrologist to management.    In addition, I have reviewed and discussed with patient certain preventive protocols, quality metrics, and best practice recommendations. A written personalized care plan for preventive services as well as general preventive health recommendations were provided to patient.     Joy Lecher, MD  08/20/2016

## 2016-08-19 NOTE — Patient Instructions (Signed)
Keep up a regular exercise program and make sure you are eating a healthy diet Try to eat 4 servings of dairy a day, or if you are lactose intolerant take a calcium with vitamin D daily.  Your vaccines are up to date.   

## 2016-08-20 DIAGNOSIS — N2581 Secondary hyperparathyroidism of renal origin: Secondary | ICD-10-CM | POA: Diagnosis not present

## 2016-08-20 DIAGNOSIS — N186 End stage renal disease: Secondary | ICD-10-CM | POA: Diagnosis not present

## 2016-08-20 NOTE — Addendum Note (Signed)
Addended by: [name] D on: 08/20/2016 02:08 PM   Modules accepted: Orders

## 2016-08-22 DIAGNOSIS — N186 End stage renal disease: Secondary | ICD-10-CM | POA: Diagnosis not present

## 2016-08-22 DIAGNOSIS — N2581 Secondary hyperparathyroidism of renal origin: Secondary | ICD-10-CM | POA: Diagnosis not present

## 2016-08-25 DIAGNOSIS — N2581 Secondary hyperparathyroidism of renal origin: Secondary | ICD-10-CM | POA: Diagnosis not present

## 2016-08-25 DIAGNOSIS — N186 End stage renal disease: Secondary | ICD-10-CM | POA: Diagnosis not present

## 2016-08-25 DIAGNOSIS — I82409 Acute embolism and thrombosis of unspecified deep veins of unspecified lower extremity: Secondary | ICD-10-CM | POA: Diagnosis not present

## 2016-08-27 DIAGNOSIS — D631 Anemia in chronic kidney disease: Secondary | ICD-10-CM | POA: Diagnosis not present

## 2016-08-27 DIAGNOSIS — N2581 Secondary hyperparathyroidism of renal origin: Secondary | ICD-10-CM | POA: Diagnosis not present

## 2016-08-27 DIAGNOSIS — N186 End stage renal disease: Secondary | ICD-10-CM | POA: Diagnosis not present

## 2016-08-29 DIAGNOSIS — N2581 Secondary hyperparathyroidism of renal origin: Secondary | ICD-10-CM | POA: Diagnosis not present

## 2016-08-29 DIAGNOSIS — N186 End stage renal disease: Secondary | ICD-10-CM | POA: Diagnosis not present

## 2016-08-29 DIAGNOSIS — D631 Anemia in chronic kidney disease: Secondary | ICD-10-CM | POA: Diagnosis not present

## 2016-09-01 DIAGNOSIS — D631 Anemia in chronic kidney disease: Secondary | ICD-10-CM | POA: Diagnosis not present

## 2016-09-01 DIAGNOSIS — I82409 Acute embolism and thrombosis of unspecified deep veins of unspecified lower extremity: Secondary | ICD-10-CM | POA: Diagnosis not present

## 2016-09-01 DIAGNOSIS — N2581 Secondary hyperparathyroidism of renal origin: Secondary | ICD-10-CM | POA: Diagnosis not present

## 2016-09-01 DIAGNOSIS — N186 End stage renal disease: Secondary | ICD-10-CM | POA: Diagnosis not present

## 2016-09-03 DIAGNOSIS — D631 Anemia in chronic kidney disease: Secondary | ICD-10-CM | POA: Diagnosis not present

## 2016-09-03 DIAGNOSIS — N186 End stage renal disease: Secondary | ICD-10-CM | POA: Diagnosis not present

## 2016-09-03 DIAGNOSIS — N2581 Secondary hyperparathyroidism of renal origin: Secondary | ICD-10-CM | POA: Diagnosis not present

## 2016-09-05 ENCOUNTER — Encounter: Payer: Self-pay | Admitting: Family Medicine

## 2016-09-05 DIAGNOSIS — D631 Anemia in chronic kidney disease: Secondary | ICD-10-CM | POA: Diagnosis not present

## 2016-09-05 DIAGNOSIS — N186 End stage renal disease: Secondary | ICD-10-CM | POA: Diagnosis not present

## 2016-09-05 DIAGNOSIS — Z992 Dependence on renal dialysis: Secondary | ICD-10-CM | POA: Diagnosis not present

## 2016-09-05 DIAGNOSIS — N2581 Secondary hyperparathyroidism of renal origin: Secondary | ICD-10-CM | POA: Diagnosis not present

## 2016-09-08 DIAGNOSIS — D509 Iron deficiency anemia, unspecified: Secondary | ICD-10-CM | POA: Diagnosis not present

## 2016-09-08 DIAGNOSIS — N186 End stage renal disease: Secondary | ICD-10-CM | POA: Diagnosis not present

## 2016-09-08 DIAGNOSIS — I82409 Acute embolism and thrombosis of unspecified deep veins of unspecified lower extremity: Secondary | ICD-10-CM | POA: Diagnosis not present

## 2016-09-08 DIAGNOSIS — N2581 Secondary hyperparathyroidism of renal origin: Secondary | ICD-10-CM | POA: Diagnosis not present

## 2016-09-10 ENCOUNTER — Encounter: Payer: Self-pay | Admitting: Family Medicine

## 2016-09-10 DIAGNOSIS — D509 Iron deficiency anemia, unspecified: Secondary | ICD-10-CM | POA: Diagnosis not present

## 2016-09-10 DIAGNOSIS — E1139 Type 2 diabetes mellitus with other diabetic ophthalmic complication: Secondary | ICD-10-CM | POA: Diagnosis not present

## 2016-09-10 DIAGNOSIS — N2581 Secondary hyperparathyroidism of renal origin: Secondary | ICD-10-CM | POA: Diagnosis not present

## 2016-09-10 DIAGNOSIS — N186 End stage renal disease: Secondary | ICD-10-CM | POA: Diagnosis not present

## 2016-09-12 DIAGNOSIS — N2581 Secondary hyperparathyroidism of renal origin: Secondary | ICD-10-CM | POA: Diagnosis not present

## 2016-09-12 DIAGNOSIS — D509 Iron deficiency anemia, unspecified: Secondary | ICD-10-CM | POA: Diagnosis not present

## 2016-09-12 DIAGNOSIS — N186 End stage renal disease: Secondary | ICD-10-CM | POA: Diagnosis not present

## 2016-09-15 DIAGNOSIS — D509 Iron deficiency anemia, unspecified: Secondary | ICD-10-CM | POA: Diagnosis not present

## 2016-09-15 DIAGNOSIS — N186 End stage renal disease: Secondary | ICD-10-CM | POA: Diagnosis not present

## 2016-09-15 DIAGNOSIS — I82409 Acute embolism and thrombosis of unspecified deep veins of unspecified lower extremity: Secondary | ICD-10-CM | POA: Diagnosis not present

## 2016-09-15 DIAGNOSIS — N2581 Secondary hyperparathyroidism of renal origin: Secondary | ICD-10-CM | POA: Diagnosis not present

## 2016-09-17 DIAGNOSIS — D631 Anemia in chronic kidney disease: Secondary | ICD-10-CM | POA: Diagnosis not present

## 2016-09-17 DIAGNOSIS — N2581 Secondary hyperparathyroidism of renal origin: Secondary | ICD-10-CM | POA: Diagnosis not present

## 2016-09-17 DIAGNOSIS — N186 End stage renal disease: Secondary | ICD-10-CM | POA: Diagnosis not present

## 2016-09-19 DIAGNOSIS — N186 End stage renal disease: Secondary | ICD-10-CM | POA: Diagnosis not present

## 2016-09-19 DIAGNOSIS — D631 Anemia in chronic kidney disease: Secondary | ICD-10-CM | POA: Diagnosis not present

## 2016-09-19 DIAGNOSIS — N2581 Secondary hyperparathyroidism of renal origin: Secondary | ICD-10-CM | POA: Diagnosis not present

## 2016-09-22 DIAGNOSIS — D631 Anemia in chronic kidney disease: Secondary | ICD-10-CM | POA: Diagnosis not present

## 2016-09-22 DIAGNOSIS — I82409 Acute embolism and thrombosis of unspecified deep veins of unspecified lower extremity: Secondary | ICD-10-CM | POA: Diagnosis not present

## 2016-09-22 DIAGNOSIS — N186 End stage renal disease: Secondary | ICD-10-CM | POA: Diagnosis not present

## 2016-09-22 DIAGNOSIS — N2581 Secondary hyperparathyroidism of renal origin: Secondary | ICD-10-CM | POA: Diagnosis not present

## 2016-09-24 DIAGNOSIS — N186 End stage renal disease: Secondary | ICD-10-CM | POA: Diagnosis not present

## 2016-09-24 DIAGNOSIS — N2581 Secondary hyperparathyroidism of renal origin: Secondary | ICD-10-CM | POA: Diagnosis not present

## 2016-09-24 DIAGNOSIS — D631 Anemia in chronic kidney disease: Secondary | ICD-10-CM | POA: Diagnosis not present

## 2016-09-26 DIAGNOSIS — N2581 Secondary hyperparathyroidism of renal origin: Secondary | ICD-10-CM | POA: Diagnosis not present

## 2016-09-26 DIAGNOSIS — D631 Anemia in chronic kidney disease: Secondary | ICD-10-CM | POA: Diagnosis not present

## 2016-09-26 DIAGNOSIS — N186 End stage renal disease: Secondary | ICD-10-CM | POA: Diagnosis not present

## 2016-09-29 DIAGNOSIS — D631 Anemia in chronic kidney disease: Secondary | ICD-10-CM | POA: Diagnosis not present

## 2016-09-29 DIAGNOSIS — N2581 Secondary hyperparathyroidism of renal origin: Secondary | ICD-10-CM | POA: Diagnosis not present

## 2016-09-29 DIAGNOSIS — N186 End stage renal disease: Secondary | ICD-10-CM | POA: Diagnosis not present

## 2016-09-29 DIAGNOSIS — I82409 Acute embolism and thrombosis of unspecified deep veins of unspecified lower extremity: Secondary | ICD-10-CM | POA: Diagnosis not present

## 2016-10-01 DIAGNOSIS — D631 Anemia in chronic kidney disease: Secondary | ICD-10-CM | POA: Diagnosis not present

## 2016-10-01 DIAGNOSIS — N186 End stage renal disease: Secondary | ICD-10-CM | POA: Diagnosis not present

## 2016-10-01 DIAGNOSIS — N2581 Secondary hyperparathyroidism of renal origin: Secondary | ICD-10-CM | POA: Diagnosis not present

## 2016-10-03 DIAGNOSIS — N2581 Secondary hyperparathyroidism of renal origin: Secondary | ICD-10-CM | POA: Diagnosis not present

## 2016-10-03 DIAGNOSIS — D631 Anemia in chronic kidney disease: Secondary | ICD-10-CM | POA: Diagnosis not present

## 2016-10-03 DIAGNOSIS — N186 End stage renal disease: Secondary | ICD-10-CM | POA: Diagnosis not present

## 2016-10-05 DIAGNOSIS — Z992 Dependence on renal dialysis: Secondary | ICD-10-CM | POA: Diagnosis not present

## 2016-10-05 DIAGNOSIS — N186 End stage renal disease: Secondary | ICD-10-CM | POA: Diagnosis not present

## 2016-10-06 DIAGNOSIS — I82409 Acute embolism and thrombosis of unspecified deep veins of unspecified lower extremity: Secondary | ICD-10-CM | POA: Diagnosis not present

## 2016-10-06 DIAGNOSIS — N186 End stage renal disease: Secondary | ICD-10-CM | POA: Diagnosis not present

## 2016-10-06 DIAGNOSIS — D631 Anemia in chronic kidney disease: Secondary | ICD-10-CM | POA: Diagnosis not present

## 2016-10-06 DIAGNOSIS — D509 Iron deficiency anemia, unspecified: Secondary | ICD-10-CM | POA: Diagnosis not present

## 2016-10-06 DIAGNOSIS — Z23 Encounter for immunization: Secondary | ICD-10-CM | POA: Diagnosis not present

## 2016-10-06 DIAGNOSIS — N2581 Secondary hyperparathyroidism of renal origin: Secondary | ICD-10-CM | POA: Diagnosis not present

## 2016-10-08 DIAGNOSIS — D631 Anemia in chronic kidney disease: Secondary | ICD-10-CM | POA: Diagnosis not present

## 2016-10-08 DIAGNOSIS — Z23 Encounter for immunization: Secondary | ICD-10-CM | POA: Diagnosis not present

## 2016-10-08 DIAGNOSIS — D509 Iron deficiency anemia, unspecified: Secondary | ICD-10-CM | POA: Diagnosis not present

## 2016-10-08 DIAGNOSIS — N186 End stage renal disease: Secondary | ICD-10-CM | POA: Diagnosis not present

## 2016-10-08 DIAGNOSIS — E1139 Type 2 diabetes mellitus with other diabetic ophthalmic complication: Secondary | ICD-10-CM | POA: Diagnosis not present

## 2016-10-08 DIAGNOSIS — N2581 Secondary hyperparathyroidism of renal origin: Secondary | ICD-10-CM | POA: Diagnosis not present

## 2016-10-10 DIAGNOSIS — N186 End stage renal disease: Secondary | ICD-10-CM | POA: Diagnosis not present

## 2016-10-10 DIAGNOSIS — Z23 Encounter for immunization: Secondary | ICD-10-CM | POA: Diagnosis not present

## 2016-10-10 DIAGNOSIS — D631 Anemia in chronic kidney disease: Secondary | ICD-10-CM | POA: Diagnosis not present

## 2016-10-10 DIAGNOSIS — N2581 Secondary hyperparathyroidism of renal origin: Secondary | ICD-10-CM | POA: Diagnosis not present

## 2016-10-10 DIAGNOSIS — D509 Iron deficiency anemia, unspecified: Secondary | ICD-10-CM | POA: Diagnosis not present

## 2016-10-13 DIAGNOSIS — N2581 Secondary hyperparathyroidism of renal origin: Secondary | ICD-10-CM | POA: Diagnosis not present

## 2016-10-13 DIAGNOSIS — N186 End stage renal disease: Secondary | ICD-10-CM | POA: Diagnosis not present

## 2016-10-13 DIAGNOSIS — I82409 Acute embolism and thrombosis of unspecified deep veins of unspecified lower extremity: Secondary | ICD-10-CM | POA: Diagnosis not present

## 2016-10-13 DIAGNOSIS — Z23 Encounter for immunization: Secondary | ICD-10-CM | POA: Diagnosis not present

## 2016-10-13 DIAGNOSIS — D631 Anemia in chronic kidney disease: Secondary | ICD-10-CM | POA: Diagnosis not present

## 2016-10-13 DIAGNOSIS — D509 Iron deficiency anemia, unspecified: Secondary | ICD-10-CM | POA: Diagnosis not present

## 2016-10-15 DIAGNOSIS — N2581 Secondary hyperparathyroidism of renal origin: Secondary | ICD-10-CM | POA: Diagnosis not present

## 2016-10-15 DIAGNOSIS — D631 Anemia in chronic kidney disease: Secondary | ICD-10-CM | POA: Diagnosis not present

## 2016-10-15 DIAGNOSIS — N186 End stage renal disease: Secondary | ICD-10-CM | POA: Diagnosis not present

## 2016-10-15 DIAGNOSIS — Z23 Encounter for immunization: Secondary | ICD-10-CM | POA: Diagnosis not present

## 2016-10-15 DIAGNOSIS — D509 Iron deficiency anemia, unspecified: Secondary | ICD-10-CM | POA: Diagnosis not present

## 2016-10-17 DIAGNOSIS — N186 End stage renal disease: Secondary | ICD-10-CM | POA: Diagnosis not present

## 2016-10-17 DIAGNOSIS — N2581 Secondary hyperparathyroidism of renal origin: Secondary | ICD-10-CM | POA: Diagnosis not present

## 2016-10-20 DIAGNOSIS — I82409 Acute embolism and thrombosis of unspecified deep veins of unspecified lower extremity: Secondary | ICD-10-CM | POA: Diagnosis not present

## 2016-10-20 DIAGNOSIS — N186 End stage renal disease: Secondary | ICD-10-CM | POA: Diagnosis not present

## 2016-10-20 DIAGNOSIS — N2581 Secondary hyperparathyroidism of renal origin: Secondary | ICD-10-CM | POA: Diagnosis not present

## 2016-10-22 DIAGNOSIS — I82409 Acute embolism and thrombosis of unspecified deep veins of unspecified lower extremity: Secondary | ICD-10-CM | POA: Diagnosis not present

## 2016-10-22 DIAGNOSIS — N186 End stage renal disease: Secondary | ICD-10-CM | POA: Diagnosis not present

## 2016-10-22 DIAGNOSIS — N2581 Secondary hyperparathyroidism of renal origin: Secondary | ICD-10-CM | POA: Diagnosis not present

## 2016-10-24 DIAGNOSIS — N2581 Secondary hyperparathyroidism of renal origin: Secondary | ICD-10-CM | POA: Diagnosis not present

## 2016-10-24 DIAGNOSIS — N186 End stage renal disease: Secondary | ICD-10-CM | POA: Diagnosis not present

## 2016-10-27 DIAGNOSIS — N186 End stage renal disease: Secondary | ICD-10-CM | POA: Diagnosis not present

## 2016-10-27 DIAGNOSIS — D631 Anemia in chronic kidney disease: Secondary | ICD-10-CM | POA: Diagnosis not present

## 2016-10-27 DIAGNOSIS — N2581 Secondary hyperparathyroidism of renal origin: Secondary | ICD-10-CM | POA: Diagnosis not present

## 2016-10-27 DIAGNOSIS — I82409 Acute embolism and thrombosis of unspecified deep veins of unspecified lower extremity: Secondary | ICD-10-CM | POA: Diagnosis not present

## 2016-10-29 DIAGNOSIS — N2581 Secondary hyperparathyroidism of renal origin: Secondary | ICD-10-CM | POA: Diagnosis not present

## 2016-10-29 DIAGNOSIS — D631 Anemia in chronic kidney disease: Secondary | ICD-10-CM | POA: Diagnosis not present

## 2016-10-29 DIAGNOSIS — N186 End stage renal disease: Secondary | ICD-10-CM | POA: Diagnosis not present

## 2016-10-31 DIAGNOSIS — N186 End stage renal disease: Secondary | ICD-10-CM | POA: Diagnosis not present

## 2016-10-31 DIAGNOSIS — N2581 Secondary hyperparathyroidism of renal origin: Secondary | ICD-10-CM | POA: Diagnosis not present

## 2016-10-31 DIAGNOSIS — D631 Anemia in chronic kidney disease: Secondary | ICD-10-CM | POA: Diagnosis not present

## 2016-11-03 DIAGNOSIS — N186 End stage renal disease: Secondary | ICD-10-CM | POA: Diagnosis not present

## 2016-11-03 DIAGNOSIS — N2581 Secondary hyperparathyroidism of renal origin: Secondary | ICD-10-CM | POA: Diagnosis not present

## 2016-11-03 DIAGNOSIS — D631 Anemia in chronic kidney disease: Secondary | ICD-10-CM | POA: Diagnosis not present

## 2016-11-03 DIAGNOSIS — I82409 Acute embolism and thrombosis of unspecified deep veins of unspecified lower extremity: Secondary | ICD-10-CM | POA: Diagnosis not present

## 2016-11-05 DIAGNOSIS — D631 Anemia in chronic kidney disease: Secondary | ICD-10-CM | POA: Diagnosis not present

## 2016-11-05 DIAGNOSIS — Z992 Dependence on renal dialysis: Secondary | ICD-10-CM | POA: Diagnosis not present

## 2016-11-05 DIAGNOSIS — N2581 Secondary hyperparathyroidism of renal origin: Secondary | ICD-10-CM | POA: Diagnosis not present

## 2016-11-05 DIAGNOSIS — N186 End stage renal disease: Secondary | ICD-10-CM | POA: Diagnosis not present

## 2016-11-07 DIAGNOSIS — N2581 Secondary hyperparathyroidism of renal origin: Secondary | ICD-10-CM | POA: Diagnosis not present

## 2016-11-07 DIAGNOSIS — N186 End stage renal disease: Secondary | ICD-10-CM | POA: Diagnosis not present

## 2016-11-07 DIAGNOSIS — D509 Iron deficiency anemia, unspecified: Secondary | ICD-10-CM | POA: Diagnosis not present

## 2016-11-10 DIAGNOSIS — I82409 Acute embolism and thrombosis of unspecified deep veins of unspecified lower extremity: Secondary | ICD-10-CM | POA: Diagnosis not present

## 2016-11-10 DIAGNOSIS — D509 Iron deficiency anemia, unspecified: Secondary | ICD-10-CM | POA: Diagnosis not present

## 2016-11-10 DIAGNOSIS — N186 End stage renal disease: Secondary | ICD-10-CM | POA: Diagnosis not present

## 2016-11-10 DIAGNOSIS — N2581 Secondary hyperparathyroidism of renal origin: Secondary | ICD-10-CM | POA: Diagnosis not present

## 2016-11-12 DIAGNOSIS — N2581 Secondary hyperparathyroidism of renal origin: Secondary | ICD-10-CM | POA: Diagnosis not present

## 2016-11-12 DIAGNOSIS — E1139 Type 2 diabetes mellitus with other diabetic ophthalmic complication: Secondary | ICD-10-CM | POA: Diagnosis not present

## 2016-11-12 DIAGNOSIS — N186 End stage renal disease: Secondary | ICD-10-CM | POA: Diagnosis not present

## 2016-11-12 DIAGNOSIS — D509 Iron deficiency anemia, unspecified: Secondary | ICD-10-CM | POA: Diagnosis not present

## 2016-11-14 DIAGNOSIS — N186 End stage renal disease: Secondary | ICD-10-CM | POA: Diagnosis not present

## 2016-11-14 DIAGNOSIS — N2581 Secondary hyperparathyroidism of renal origin: Secondary | ICD-10-CM | POA: Diagnosis not present

## 2016-11-14 DIAGNOSIS — D509 Iron deficiency anemia, unspecified: Secondary | ICD-10-CM | POA: Diagnosis not present

## 2016-11-17 DIAGNOSIS — I82409 Acute embolism and thrombosis of unspecified deep veins of unspecified lower extremity: Secondary | ICD-10-CM | POA: Diagnosis not present

## 2016-11-17 DIAGNOSIS — D631 Anemia in chronic kidney disease: Secondary | ICD-10-CM | POA: Diagnosis not present

## 2016-11-17 DIAGNOSIS — N186 End stage renal disease: Secondary | ICD-10-CM | POA: Diagnosis not present

## 2016-11-17 DIAGNOSIS — N2581 Secondary hyperparathyroidism of renal origin: Secondary | ICD-10-CM | POA: Diagnosis not present

## 2016-11-19 DIAGNOSIS — D631 Anemia in chronic kidney disease: Secondary | ICD-10-CM | POA: Diagnosis not present

## 2016-11-19 DIAGNOSIS — N2581 Secondary hyperparathyroidism of renal origin: Secondary | ICD-10-CM | POA: Diagnosis not present

## 2016-11-19 DIAGNOSIS — N186 End stage renal disease: Secondary | ICD-10-CM | POA: Diagnosis not present

## 2016-11-21 DIAGNOSIS — N186 End stage renal disease: Secondary | ICD-10-CM | POA: Diagnosis not present

## 2016-11-21 DIAGNOSIS — D631 Anemia in chronic kidney disease: Secondary | ICD-10-CM | POA: Diagnosis not present

## 2016-11-21 DIAGNOSIS — N2581 Secondary hyperparathyroidism of renal origin: Secondary | ICD-10-CM | POA: Diagnosis not present

## 2016-11-23 DIAGNOSIS — D631 Anemia in chronic kidney disease: Secondary | ICD-10-CM | POA: Diagnosis not present

## 2016-11-23 DIAGNOSIS — N2581 Secondary hyperparathyroidism of renal origin: Secondary | ICD-10-CM | POA: Diagnosis not present

## 2016-11-23 DIAGNOSIS — N186 End stage renal disease: Secondary | ICD-10-CM | POA: Diagnosis not present

## 2016-11-25 DIAGNOSIS — D631 Anemia in chronic kidney disease: Secondary | ICD-10-CM | POA: Diagnosis not present

## 2016-11-25 DIAGNOSIS — I82409 Acute embolism and thrombosis of unspecified deep veins of unspecified lower extremity: Secondary | ICD-10-CM | POA: Diagnosis not present

## 2016-11-25 DIAGNOSIS — N186 End stage renal disease: Secondary | ICD-10-CM | POA: Diagnosis not present

## 2016-11-25 DIAGNOSIS — N2581 Secondary hyperparathyroidism of renal origin: Secondary | ICD-10-CM | POA: Diagnosis not present

## 2016-11-28 DIAGNOSIS — N2581 Secondary hyperparathyroidism of renal origin: Secondary | ICD-10-CM | POA: Diagnosis not present

## 2016-11-28 DIAGNOSIS — D631 Anemia in chronic kidney disease: Secondary | ICD-10-CM | POA: Diagnosis not present

## 2016-11-28 DIAGNOSIS — N186 End stage renal disease: Secondary | ICD-10-CM | POA: Diagnosis not present

## 2016-12-01 DIAGNOSIS — N186 End stage renal disease: Secondary | ICD-10-CM | POA: Diagnosis not present

## 2016-12-01 DIAGNOSIS — I82409 Acute embolism and thrombosis of unspecified deep veins of unspecified lower extremity: Secondary | ICD-10-CM | POA: Diagnosis not present

## 2016-12-01 DIAGNOSIS — D631 Anemia in chronic kidney disease: Secondary | ICD-10-CM | POA: Diagnosis not present

## 2016-12-01 DIAGNOSIS — N2581 Secondary hyperparathyroidism of renal origin: Secondary | ICD-10-CM | POA: Diagnosis not present

## 2016-12-03 DIAGNOSIS — D631 Anemia in chronic kidney disease: Secondary | ICD-10-CM | POA: Diagnosis not present

## 2016-12-03 DIAGNOSIS — N2581 Secondary hyperparathyroidism of renal origin: Secondary | ICD-10-CM | POA: Diagnosis not present

## 2016-12-03 DIAGNOSIS — N186 End stage renal disease: Secondary | ICD-10-CM | POA: Diagnosis not present

## 2016-12-05 DIAGNOSIS — D631 Anemia in chronic kidney disease: Secondary | ICD-10-CM | POA: Diagnosis not present

## 2016-12-05 DIAGNOSIS — Z992 Dependence on renal dialysis: Secondary | ICD-10-CM | POA: Diagnosis not present

## 2016-12-05 DIAGNOSIS — N186 End stage renal disease: Secondary | ICD-10-CM | POA: Diagnosis not present

## 2016-12-05 DIAGNOSIS — N2581 Secondary hyperparathyroidism of renal origin: Secondary | ICD-10-CM | POA: Diagnosis not present

## 2016-12-06 DIAGNOSIS — N186 End stage renal disease: Secondary | ICD-10-CM | POA: Diagnosis not present

## 2016-12-06 DIAGNOSIS — D509 Iron deficiency anemia, unspecified: Secondary | ICD-10-CM | POA: Diagnosis not present

## 2016-12-06 DIAGNOSIS — N2581 Secondary hyperparathyroidism of renal origin: Secondary | ICD-10-CM | POA: Diagnosis not present

## 2016-12-08 DIAGNOSIS — N2581 Secondary hyperparathyroidism of renal origin: Secondary | ICD-10-CM | POA: Diagnosis not present

## 2016-12-08 DIAGNOSIS — N186 End stage renal disease: Secondary | ICD-10-CM | POA: Diagnosis not present

## 2016-12-08 DIAGNOSIS — D509 Iron deficiency anemia, unspecified: Secondary | ICD-10-CM | POA: Diagnosis not present

## 2016-12-10 DIAGNOSIS — D509 Iron deficiency anemia, unspecified: Secondary | ICD-10-CM | POA: Diagnosis not present

## 2016-12-10 DIAGNOSIS — N2581 Secondary hyperparathyroidism of renal origin: Secondary | ICD-10-CM | POA: Diagnosis not present

## 2016-12-10 DIAGNOSIS — N186 End stage renal disease: Secondary | ICD-10-CM | POA: Diagnosis not present

## 2016-12-12 DIAGNOSIS — N186 End stage renal disease: Secondary | ICD-10-CM | POA: Diagnosis not present

## 2016-12-12 DIAGNOSIS — D509 Iron deficiency anemia, unspecified: Secondary | ICD-10-CM | POA: Diagnosis not present

## 2016-12-12 DIAGNOSIS — N2581 Secondary hyperparathyroidism of renal origin: Secondary | ICD-10-CM | POA: Diagnosis not present

## 2016-12-17 DIAGNOSIS — N186 End stage renal disease: Secondary | ICD-10-CM | POA: Diagnosis not present

## 2016-12-17 DIAGNOSIS — N2581 Secondary hyperparathyroidism of renal origin: Secondary | ICD-10-CM | POA: Diagnosis not present

## 2016-12-17 DIAGNOSIS — D631 Anemia in chronic kidney disease: Secondary | ICD-10-CM | POA: Diagnosis not present

## 2016-12-19 DIAGNOSIS — D631 Anemia in chronic kidney disease: Secondary | ICD-10-CM | POA: Diagnosis not present

## 2016-12-19 DIAGNOSIS — N2581 Secondary hyperparathyroidism of renal origin: Secondary | ICD-10-CM | POA: Diagnosis not present

## 2016-12-19 DIAGNOSIS — N186 End stage renal disease: Secondary | ICD-10-CM | POA: Diagnosis not present

## 2016-12-22 DIAGNOSIS — D631 Anemia in chronic kidney disease: Secondary | ICD-10-CM | POA: Diagnosis not present

## 2016-12-22 DIAGNOSIS — N2581 Secondary hyperparathyroidism of renal origin: Secondary | ICD-10-CM | POA: Diagnosis not present

## 2016-12-22 DIAGNOSIS — N186 End stage renal disease: Secondary | ICD-10-CM | POA: Diagnosis not present

## 2016-12-24 DIAGNOSIS — N186 End stage renal disease: Secondary | ICD-10-CM | POA: Diagnosis not present

## 2016-12-24 DIAGNOSIS — N2581 Secondary hyperparathyroidism of renal origin: Secondary | ICD-10-CM | POA: Diagnosis not present

## 2016-12-24 DIAGNOSIS — D631 Anemia in chronic kidney disease: Secondary | ICD-10-CM | POA: Diagnosis not present

## 2016-12-26 DIAGNOSIS — N186 End stage renal disease: Secondary | ICD-10-CM | POA: Diagnosis not present

## 2016-12-26 DIAGNOSIS — N2581 Secondary hyperparathyroidism of renal origin: Secondary | ICD-10-CM | POA: Diagnosis not present

## 2016-12-26 DIAGNOSIS — D631 Anemia in chronic kidney disease: Secondary | ICD-10-CM | POA: Diagnosis not present

## 2016-12-28 DIAGNOSIS — N2581 Secondary hyperparathyroidism of renal origin: Secondary | ICD-10-CM | POA: Diagnosis not present

## 2016-12-28 DIAGNOSIS — D631 Anemia in chronic kidney disease: Secondary | ICD-10-CM | POA: Diagnosis not present

## 2016-12-28 DIAGNOSIS — N186 End stage renal disease: Secondary | ICD-10-CM | POA: Diagnosis not present

## 2016-12-31 DIAGNOSIS — N2581 Secondary hyperparathyroidism of renal origin: Secondary | ICD-10-CM | POA: Diagnosis not present

## 2016-12-31 DIAGNOSIS — D631 Anemia in chronic kidney disease: Secondary | ICD-10-CM | POA: Diagnosis not present

## 2016-12-31 DIAGNOSIS — N186 End stage renal disease: Secondary | ICD-10-CM | POA: Diagnosis not present

## 2016-12-31 DIAGNOSIS — I82409 Acute embolism and thrombosis of unspecified deep veins of unspecified lower extremity: Secondary | ICD-10-CM | POA: Diagnosis not present

## 2017-01-02 DIAGNOSIS — D631 Anemia in chronic kidney disease: Secondary | ICD-10-CM | POA: Diagnosis not present

## 2017-01-02 DIAGNOSIS — N2581 Secondary hyperparathyroidism of renal origin: Secondary | ICD-10-CM | POA: Diagnosis not present

## 2017-01-02 DIAGNOSIS — N186 End stage renal disease: Secondary | ICD-10-CM | POA: Diagnosis not present

## 2017-01-05 DIAGNOSIS — N2581 Secondary hyperparathyroidism of renal origin: Secondary | ICD-10-CM | POA: Diagnosis not present

## 2017-01-05 DIAGNOSIS — N186 End stage renal disease: Secondary | ICD-10-CM | POA: Diagnosis not present

## 2017-01-05 DIAGNOSIS — I82409 Acute embolism and thrombosis of unspecified deep veins of unspecified lower extremity: Secondary | ICD-10-CM | POA: Diagnosis not present

## 2017-01-05 DIAGNOSIS — Z992 Dependence on renal dialysis: Secondary | ICD-10-CM | POA: Diagnosis not present

## 2017-01-05 DIAGNOSIS — D631 Anemia in chronic kidney disease: Secondary | ICD-10-CM | POA: Diagnosis not present

## 2017-01-06 DIAGNOSIS — N2581 Secondary hyperparathyroidism of renal origin: Secondary | ICD-10-CM | POA: Diagnosis not present

## 2017-01-06 DIAGNOSIS — D509 Iron deficiency anemia, unspecified: Secondary | ICD-10-CM | POA: Diagnosis not present

## 2017-01-06 DIAGNOSIS — N186 End stage renal disease: Secondary | ICD-10-CM | POA: Diagnosis not present

## 2017-01-07 DIAGNOSIS — D509 Iron deficiency anemia, unspecified: Secondary | ICD-10-CM | POA: Diagnosis not present

## 2017-01-07 DIAGNOSIS — N2581 Secondary hyperparathyroidism of renal origin: Secondary | ICD-10-CM | POA: Diagnosis not present

## 2017-01-07 DIAGNOSIS — E1139 Type 2 diabetes mellitus with other diabetic ophthalmic complication: Secondary | ICD-10-CM | POA: Diagnosis not present

## 2017-01-07 DIAGNOSIS — N186 End stage renal disease: Secondary | ICD-10-CM | POA: Diagnosis not present

## 2017-01-09 DIAGNOSIS — N2581 Secondary hyperparathyroidism of renal origin: Secondary | ICD-10-CM | POA: Diagnosis not present

## 2017-01-09 DIAGNOSIS — D509 Iron deficiency anemia, unspecified: Secondary | ICD-10-CM | POA: Diagnosis not present

## 2017-01-09 DIAGNOSIS — N186 End stage renal disease: Secondary | ICD-10-CM | POA: Diagnosis not present

## 2017-01-11 DIAGNOSIS — Z992 Dependence on renal dialysis: Secondary | ICD-10-CM | POA: Diagnosis not present

## 2017-01-11 DIAGNOSIS — Z7901 Long term (current) use of anticoagulants: Secondary | ICD-10-CM | POA: Diagnosis not present

## 2017-01-11 DIAGNOSIS — J181 Lobar pneumonia, unspecified organism: Secondary | ICD-10-CM | POA: Diagnosis not present

## 2017-01-11 DIAGNOSIS — Z91048 Other nonmedicinal substance allergy status: Secondary | ICD-10-CM | POA: Diagnosis not present

## 2017-01-11 DIAGNOSIS — I120 Hypertensive chronic kidney disease with stage 5 chronic kidney disease or end stage renal disease: Secondary | ICD-10-CM | POA: Diagnosis not present

## 2017-01-11 DIAGNOSIS — R760 Raised antibody titer: Secondary | ICD-10-CM | POA: Diagnosis not present

## 2017-01-11 DIAGNOSIS — Z86718 Personal history of other venous thrombosis and embolism: Secondary | ICD-10-CM | POA: Diagnosis not present

## 2017-01-11 DIAGNOSIS — N838 Other noninflammatory disorders of ovary, fallopian tube and broad ligament: Secondary | ICD-10-CM | POA: Diagnosis not present

## 2017-01-11 DIAGNOSIS — R918 Other nonspecific abnormal finding of lung field: Secondary | ICD-10-CM | POA: Diagnosis not present

## 2017-01-11 DIAGNOSIS — N949 Unspecified condition associated with female genital organs and menstrual cycle: Secondary | ICD-10-CM | POA: Diagnosis not present

## 2017-01-11 DIAGNOSIS — J156 Pneumonia due to other aerobic Gram-negative bacteria: Secondary | ICD-10-CM | POA: Diagnosis not present

## 2017-01-11 DIAGNOSIS — M329 Systemic lupus erythematosus, unspecified: Secondary | ICD-10-CM | POA: Diagnosis not present

## 2017-01-11 DIAGNOSIS — R1110 Vomiting, unspecified: Secondary | ICD-10-CM | POA: Diagnosis not present

## 2017-01-11 DIAGNOSIS — J189 Pneumonia, unspecified organism: Secondary | ICD-10-CM | POA: Diagnosis not present

## 2017-01-11 DIAGNOSIS — K5790 Diverticulosis of intestine, part unspecified, without perforation or abscess without bleeding: Secondary | ICD-10-CM | POA: Diagnosis not present

## 2017-01-11 DIAGNOSIS — R509 Fever, unspecified: Secondary | ICD-10-CM | POA: Diagnosis not present

## 2017-01-11 DIAGNOSIS — N281 Cyst of kidney, acquired: Secondary | ICD-10-CM | POA: Diagnosis not present

## 2017-01-11 DIAGNOSIS — Z888 Allergy status to other drugs, medicaments and biological substances status: Secondary | ICD-10-CM | POA: Diagnosis not present

## 2017-01-11 DIAGNOSIS — Z79899 Other long term (current) drug therapy: Secondary | ICD-10-CM | POA: Diagnosis not present

## 2017-01-11 DIAGNOSIS — N186 End stage renal disease: Secondary | ICD-10-CM | POA: Diagnosis not present

## 2017-01-11 DIAGNOSIS — K8020 Calculus of gallbladder without cholecystitis without obstruction: Secondary | ICD-10-CM | POA: Diagnosis not present

## 2017-01-11 DIAGNOSIS — R197 Diarrhea, unspecified: Secondary | ICD-10-CM | POA: Diagnosis not present

## 2017-01-12 DIAGNOSIS — J189 Pneumonia, unspecified organism: Secondary | ICD-10-CM | POA: Insufficient documentation

## 2017-01-16 ENCOUNTER — Other Ambulatory Visit: Payer: Self-pay | Admitting: *Deleted

## 2017-01-16 DIAGNOSIS — I82409 Acute embolism and thrombosis of unspecified deep veins of unspecified lower extremity: Secondary | ICD-10-CM | POA: Diagnosis not present

## 2017-01-16 DIAGNOSIS — N2581 Secondary hyperparathyroidism of renal origin: Secondary | ICD-10-CM | POA: Diagnosis not present

## 2017-01-16 DIAGNOSIS — D509 Iron deficiency anemia, unspecified: Secondary | ICD-10-CM | POA: Diagnosis not present

## 2017-01-16 DIAGNOSIS — D631 Anemia in chronic kidney disease: Secondary | ICD-10-CM | POA: Diagnosis not present

## 2017-01-16 DIAGNOSIS — N186 End stage renal disease: Secondary | ICD-10-CM | POA: Diagnosis not present

## 2017-01-16 MED ORDER — HYDROCODONE-HOMATROPINE 5-1.5 MG/5ML PO SYRP
5.00 | ORAL_SOLUTION | ORAL | Status: DC
Start: ? — End: 2017-01-16

## 2017-01-16 MED ORDER — NEPHRO-VITE 0.8 MG PO TABS
ORAL_TABLET | ORAL | Status: DC
Start: 2017-01-15 — End: 2017-01-16

## 2017-01-16 MED ORDER — DIPHENHYDRAMINE HCL 25 MG PO CAPS
25.00 | ORAL_CAPSULE | ORAL | Status: DC
Start: ? — End: 2017-01-16

## 2017-01-16 MED ORDER — NITROGLYCERIN 0.4 MG SL SUBL
0.40 | SUBLINGUAL_TABLET | SUBLINGUAL | Status: DC
Start: ? — End: 2017-01-16

## 2017-01-16 MED ORDER — SODIUM CHLORIDE 0.9 % IV SOLN
150.00 | INTRAVENOUS | Status: DC
Start: ? — End: 2017-01-16

## 2017-01-16 MED ORDER — ATORVASTATIN CALCIUM 20 MG PO TABS
20.00 | ORAL_TABLET | ORAL | Status: DC
Start: 2017-01-14 — End: 2017-01-16

## 2017-01-16 MED ORDER — ALTEPLASE 2 MG IJ SOLR
2.00 | INTRAMUSCULAR | Status: DC
Start: ? — End: 2017-01-16

## 2017-01-16 MED ORDER — SODIUM CHLORIDE 0.9 % IJ SOLN
50.00 | INTRAMUSCULAR | Status: DC
Start: ? — End: 2017-01-16

## 2017-01-16 MED ORDER — GENERIC EXTERNAL MEDICATION
Status: DC
Start: ? — End: 2017-01-16

## 2017-01-16 MED ORDER — AMLODIPINE BESYLATE 10 MG PO TABS
10.00 | ORAL_TABLET | ORAL | Status: DC
Start: 2017-01-15 — End: 2017-01-16

## 2017-01-16 MED ORDER — HYDROXYCHLOROQUINE SULFATE 200 MG PO TABS
200.00 | ORAL_TABLET | ORAL | Status: DC
Start: 2017-01-14 — End: 2017-01-16

## 2017-01-16 MED ORDER — LOSARTAN POTASSIUM 100 MG PO TABS
100.00 | ORAL_TABLET | ORAL | Status: DC
Start: 2017-01-15 — End: 2017-01-16

## 2017-01-16 MED ORDER — GENERIC EXTERNAL MEDICATION
5.00 | Status: DC
Start: ? — End: 2017-01-16

## 2017-01-16 MED ORDER — ALBUMIN HUMAN 25 % IV SOLN
12.50 | INTRAVENOUS | Status: DC
Start: ? — End: 2017-01-16

## 2017-01-16 MED ORDER — VITAMIN D3 25 MCG (1000 UNIT) PO TABS
ORAL_TABLET | ORAL | Status: DC
Start: 2017-01-15 — End: 2017-01-16

## 2017-01-16 MED ORDER — CALCIUM CARBONATE ANTACID 500 MG PO CHEW
CHEWABLE_TABLET | ORAL | Status: DC
Start: 2017-01-14 — End: 2017-01-16

## 2017-01-16 MED ORDER — LIDOCAINE HCL (PF) 1 % IJ SOLN
0.10 | INTRAMUSCULAR | Status: DC
Start: ? — End: 2017-01-16

## 2017-01-16 MED ORDER — CEFDINIR 300 MG PO CAPS
300.00 | ORAL_CAPSULE | ORAL | Status: DC
Start: 2017-01-14 — End: 2017-01-16

## 2017-01-16 MED ORDER — WARFARIN SODIUM 1 MG PO TABS
ORAL_TABLET | ORAL | Status: DC
Start: ? — End: 2017-01-16

## 2017-01-16 MED ORDER — DOXYCYCLINE MONOHYDRATE 100 MG PO CAPS
100.00 | ORAL_CAPSULE | ORAL | Status: DC
Start: 2017-01-14 — End: 2017-01-16

## 2017-01-16 NOTE — Patient Outreach (Signed)
[facility]) Care Management  01/16/2017  Joy Anderson [birth year] [id number]  Referral via [place]; member discharged from inpatient admission from [facility] 01/14/2017.  Per hx review: Admission 01/06-01/09/2017 Dx: Pneumonia right lower lobe Other dxs: Systemic lupus erythematous, ESRD  Telephone call#1 to patient; left HIPPA compliant voice mail requesting call back.  Plan: Will follow up.  Sherrin Daisy, RN BSN [place] Management Coordinator [facility] Care Management  [phone]

## 2017-01-17 DIAGNOSIS — N949 Unspecified condition associated with female genital organs and menstrual cycle: Secondary | ICD-10-CM | POA: Insufficient documentation

## 2017-01-19 ENCOUNTER — Other Ambulatory Visit: Payer: Self-pay | Admitting: *Deleted

## 2017-01-19 DIAGNOSIS — D509 Iron deficiency anemia, unspecified: Secondary | ICD-10-CM | POA: Diagnosis not present

## 2017-01-19 DIAGNOSIS — I82409 Acute embolism and thrombosis of unspecified deep veins of unspecified lower extremity: Secondary | ICD-10-CM | POA: Diagnosis not present

## 2017-01-19 DIAGNOSIS — N2581 Secondary hyperparathyroidism of renal origin: Secondary | ICD-10-CM | POA: Diagnosis not present

## 2017-01-19 DIAGNOSIS — D631 Anemia in chronic kidney disease: Secondary | ICD-10-CM | POA: Diagnosis not present

## 2017-01-19 DIAGNOSIS — N186 End stage renal disease: Secondary | ICD-10-CM | POA: Diagnosis not present

## 2017-01-19 NOTE — Patient Outreach (Signed)
[place] [facility]) Care Management  01/19/2017  AEISHA MINARIK 06/27/1950 [id number]   Referral via [place]; member discharged from inpatient admission from [facility] 01/14/2017.  Per hx review: Admission 01/06-01/09/2017 Dx: Pneumonia right lower lobe Other dxs: Systemic lupus erythematous, ESRD  Telephone call x 2 to patient; left HIPPA compliant voice mail requesting call back.  Plan: Will follow up.  Sherrin Daisy, RN BSN [place] Management Coordinator [facility] Care Management  [phone]

## 2017-01-20 ENCOUNTER — Encounter: Payer: Self-pay | Admitting: *Deleted

## 2017-01-20 ENCOUNTER — Other Ambulatory Visit: Payer: Self-pay | Admitting: *Deleted

## 2017-01-20 NOTE — Patient Outreach (Signed)
[facility]) Care Management  01/20/2017  Joy Anderson Jul 17, 1950 [id number]  Referral via [place]; member discharged from inpatient admission from [facility] 01/14/2017.  Per hx review: Admission 01/06-01/09/2017 [id number] right lower lobe Other dxs; Systemic lupus erythematous, ERSD.  Telephone attempt x 3; left message requesting call back.  Plan: Geophysicist/field seismologist. If no call back in 10 business days will close out.   Sherrin Daisy, RN BSN [place] Management Coordinator [name] Care Management  [phone]

## 2017-01-21 DIAGNOSIS — N2581 Secondary hyperparathyroidism of renal origin: Secondary | ICD-10-CM | POA: Diagnosis not present

## 2017-01-21 DIAGNOSIS — D509 Iron deficiency anemia, unspecified: Secondary | ICD-10-CM | POA: Diagnosis not present

## 2017-01-21 DIAGNOSIS — D631 Anemia in chronic kidney disease: Secondary | ICD-10-CM | POA: Diagnosis not present

## 2017-01-21 DIAGNOSIS — N186 End stage renal disease: Secondary | ICD-10-CM | POA: Diagnosis not present

## 2017-01-23 DIAGNOSIS — N186 End stage renal disease: Secondary | ICD-10-CM | POA: Diagnosis not present

## 2017-01-23 DIAGNOSIS — N2581 Secondary hyperparathyroidism of renal origin: Secondary | ICD-10-CM | POA: Diagnosis not present

## 2017-01-23 DIAGNOSIS — D631 Anemia in chronic kidney disease: Secondary | ICD-10-CM | POA: Diagnosis not present

## 2017-01-23 DIAGNOSIS — D509 Iron deficiency anemia, unspecified: Secondary | ICD-10-CM | POA: Diagnosis not present

## 2017-01-26 DIAGNOSIS — D631 Anemia in chronic kidney disease: Secondary | ICD-10-CM | POA: Diagnosis not present

## 2017-01-26 DIAGNOSIS — N2581 Secondary hyperparathyroidism of renal origin: Secondary | ICD-10-CM | POA: Diagnosis not present

## 2017-01-26 DIAGNOSIS — N186 End stage renal disease: Secondary | ICD-10-CM | POA: Diagnosis not present

## 2017-01-26 DIAGNOSIS — I82409 Acute embolism and thrombosis of unspecified deep veins of unspecified lower extremity: Secondary | ICD-10-CM | POA: Diagnosis not present

## 2017-01-28 DIAGNOSIS — N186 End stage renal disease: Secondary | ICD-10-CM | POA: Diagnosis not present

## 2017-01-28 DIAGNOSIS — N2581 Secondary hyperparathyroidism of renal origin: Secondary | ICD-10-CM | POA: Diagnosis not present

## 2017-01-28 DIAGNOSIS — Z1159 Encounter for screening for other viral diseases: Secondary | ICD-10-CM | POA: Diagnosis not present

## 2017-01-28 DIAGNOSIS — D631 Anemia in chronic kidney disease: Secondary | ICD-10-CM | POA: Diagnosis not present

## 2017-01-28 DIAGNOSIS — Z114 Encounter for screening for human immunodeficiency virus [HIV]: Secondary | ICD-10-CM | POA: Diagnosis not present

## 2017-01-30 DIAGNOSIS — D631 Anemia in chronic kidney disease: Secondary | ICD-10-CM | POA: Diagnosis not present

## 2017-01-30 DIAGNOSIS — N186 End stage renal disease: Secondary | ICD-10-CM | POA: Diagnosis not present

## 2017-01-30 DIAGNOSIS — N2581 Secondary hyperparathyroidism of renal origin: Secondary | ICD-10-CM | POA: Diagnosis not present

## 2017-02-02 DIAGNOSIS — D631 Anemia in chronic kidney disease: Secondary | ICD-10-CM | POA: Diagnosis not present

## 2017-02-02 DIAGNOSIS — N2581 Secondary hyperparathyroidism of renal origin: Secondary | ICD-10-CM | POA: Diagnosis not present

## 2017-02-02 DIAGNOSIS — N186 End stage renal disease: Secondary | ICD-10-CM | POA: Diagnosis not present

## 2017-02-02 DIAGNOSIS — I82409 Acute embolism and thrombosis of unspecified deep veins of unspecified lower extremity: Secondary | ICD-10-CM | POA: Diagnosis not present

## 2017-02-04 DIAGNOSIS — N2581 Secondary hyperparathyroidism of renal origin: Secondary | ICD-10-CM | POA: Diagnosis not present

## 2017-02-04 DIAGNOSIS — N186 End stage renal disease: Secondary | ICD-10-CM | POA: Diagnosis not present

## 2017-02-04 DIAGNOSIS — D631 Anemia in chronic kidney disease: Secondary | ICD-10-CM | POA: Diagnosis not present

## 2017-02-05 DIAGNOSIS — N186 End stage renal disease: Secondary | ICD-10-CM | POA: Diagnosis not present

## 2017-02-05 DIAGNOSIS — Z992 Dependence on renal dialysis: Secondary | ICD-10-CM | POA: Diagnosis not present

## 2017-02-06 ENCOUNTER — Encounter: Payer: Self-pay | Admitting: *Deleted

## 2017-02-06 ENCOUNTER — Other Ambulatory Visit: Payer: Self-pay | Admitting: *Deleted

## 2017-02-06 DIAGNOSIS — N186 End stage renal disease: Secondary | ICD-10-CM | POA: Diagnosis not present

## 2017-02-06 DIAGNOSIS — D509 Iron deficiency anemia, unspecified: Secondary | ICD-10-CM | POA: Diagnosis not present

## 2017-02-06 DIAGNOSIS — N2581 Secondary hyperparathyroidism of renal origin: Secondary | ICD-10-CM | POA: Diagnosis not present

## 2017-02-06 NOTE — Patient Outreach (Signed)
[facility]) Care Management  02/06/2017  UGOCHI HENZLER September 16, 1950 [id number]  Unsuccessful call attempts x 3.  No response to outreach letter.  Plan:  Send MD closure letter. Send for case closure to care management assistant.  Sherrin Daisy, RN BSN CCM Care Management Coordinator [facility] Care Management  [phone] .

## 2017-02-09 DIAGNOSIS — D509 Iron deficiency anemia, unspecified: Secondary | ICD-10-CM | POA: Diagnosis not present

## 2017-02-09 DIAGNOSIS — N2581 Secondary hyperparathyroidism of renal origin: Secondary | ICD-10-CM | POA: Diagnosis not present

## 2017-02-09 DIAGNOSIS — N186 End stage renal disease: Secondary | ICD-10-CM | POA: Diagnosis not present

## 2017-02-09 DIAGNOSIS — I82409 Acute embolism and thrombosis of unspecified deep veins of unspecified lower extremity: Secondary | ICD-10-CM | POA: Diagnosis not present

## 2017-02-11 DIAGNOSIS — N2581 Secondary hyperparathyroidism of renal origin: Secondary | ICD-10-CM | POA: Diagnosis not present

## 2017-02-11 DIAGNOSIS — E119 Type 2 diabetes mellitus without complications: Secondary | ICD-10-CM | POA: Diagnosis not present

## 2017-02-11 DIAGNOSIS — N186 End stage renal disease: Secondary | ICD-10-CM | POA: Diagnosis not present

## 2017-02-11 DIAGNOSIS — D509 Iron deficiency anemia, unspecified: Secondary | ICD-10-CM | POA: Diagnosis not present

## 2017-02-13 DIAGNOSIS — D509 Iron deficiency anemia, unspecified: Secondary | ICD-10-CM | POA: Diagnosis not present

## 2017-02-13 DIAGNOSIS — N186 End stage renal disease: Secondary | ICD-10-CM | POA: Diagnosis not present

## 2017-02-13 DIAGNOSIS — N2581 Secondary hyperparathyroidism of renal origin: Secondary | ICD-10-CM | POA: Diagnosis not present

## 2017-02-16 DIAGNOSIS — N186 End stage renal disease: Secondary | ICD-10-CM | POA: Diagnosis not present

## 2017-02-16 DIAGNOSIS — I82409 Acute embolism and thrombosis of unspecified deep veins of unspecified lower extremity: Secondary | ICD-10-CM | POA: Diagnosis not present

## 2017-02-16 DIAGNOSIS — N2581 Secondary hyperparathyroidism of renal origin: Secondary | ICD-10-CM | POA: Diagnosis not present

## 2017-02-16 DIAGNOSIS — D631 Anemia in chronic kidney disease: Secondary | ICD-10-CM | POA: Diagnosis not present

## 2017-02-18 DIAGNOSIS — N186 End stage renal disease: Secondary | ICD-10-CM | POA: Diagnosis not present

## 2017-02-18 DIAGNOSIS — N2581 Secondary hyperparathyroidism of renal origin: Secondary | ICD-10-CM | POA: Diagnosis not present

## 2017-02-18 DIAGNOSIS — D631 Anemia in chronic kidney disease: Secondary | ICD-10-CM | POA: Diagnosis not present

## 2017-02-19 ENCOUNTER — Ambulatory Visit (INDEPENDENT_AMBULATORY_CARE_PROVIDER_SITE_OTHER): Payer: Medicare HMO | Admitting: Family Medicine

## 2017-02-19 ENCOUNTER — Encounter: Payer: Self-pay | Admitting: Family Medicine

## 2017-02-19 VITALS — BP 150/62 | HR 81 | Ht 62.0 in | Wt 151.0 lb

## 2017-02-19 DIAGNOSIS — M87051 Idiopathic aseptic necrosis of right femur: Secondary | ICD-10-CM

## 2017-02-19 DIAGNOSIS — M87052 Idiopathic aseptic necrosis of left femur: Secondary | ICD-10-CM | POA: Diagnosis not present

## 2017-02-19 DIAGNOSIS — I10 Essential (primary) hypertension: Secondary | ICD-10-CM

## 2017-02-19 DIAGNOSIS — Z992 Dependence on renal dialysis: Secondary | ICD-10-CM | POA: Diagnosis not present

## 2017-02-19 DIAGNOSIS — N186 End stage renal disease: Secondary | ICD-10-CM

## 2017-02-19 DIAGNOSIS — Z13220 Encounter for screening for lipoid disorders: Secondary | ICD-10-CM

## 2017-02-19 LAB — HEMOGLOBIN A1C

## 2017-02-19 NOTE — Patient Instructions (Signed)
Follow up in 2 weeks for nurse visit for BP check 

## 2017-02-19 NOTE — Progress Notes (Signed)
Subjective:    CC: DM, BP  HPI:  Joy Anderson is a 67 year old female with a history of lupus and end-stage renal disease who was recently in the hospital for pneumonia.  She was admitted January 6 and discharged home 3 days later on January 9.  She was discharged home on Omnicef and doxycycline.  Recent hemoglobin A1c was normal at 5.0.  Hypertension- Pt denies chest pain, SOB, dizziness, or heart palpitations.  Taking meds as directed w/o problems.  Denies medication side effects.    ESRD -  Still on dialysis on M, W, and Fri.   Bilateral avascular necrosis of the hips-she still is in a lot of pain with her hips and is using a cane.  She would really like to have hip replacement surgery.  Got her flu shot back on October.   Hyperlipidemia-tolerating statin well without any side effects or myalgias.   Past medical history, Surgical history, Family history not pertinant except as noted below, Social history, Allergies, and medications have been entered into the medical record, reviewed, and corrections made.   Review of Systems: No fevers, chills, night sweats, weight loss, chest pain, or shortness of breath.   Objective:    General: Well Developed, well nourished, and in no acute distress.  Neuro: Alert and oriented x3, extra-ocular muscles intact, sensation grossly intact.  HEENT: Normocephalic, atraumatic  Skin: Warm and dry, no rashes. Cardiac: Regular rate and rhythm, no murmurs rubs or gallops, no lower extremity edema.  Respiratory: Clear to auscultation bilaterally. Not using accessory muscles, speaking in full sentences.   Impression and Recommendations:   HTN -pressure was definitely elevated today.  She says she did take her medication and says that normally at dialysis her blood pressures are good.  Her blood pressures have been well controlled the last several times that she is been here some not sure if today is just off of her baseline.  We will monitor  carefully.  End-stage renal disease-on hemodialysis Monday, Wednesday, Friday he had  avascular necrosis of the hip-I would like to consider referral to Dr. Dorna Leitz in [place].  She usually prefers to travel to [place] and so she will think about it and let me know which she would like to do.  Hyperlipidemia-due for lipid screen.  We need to make sure that her Lipitor is working well.

## 2017-02-20 DIAGNOSIS — N2581 Secondary hyperparathyroidism of renal origin: Secondary | ICD-10-CM | POA: Diagnosis not present

## 2017-02-20 DIAGNOSIS — D631 Anemia in chronic kidney disease: Secondary | ICD-10-CM | POA: Diagnosis not present

## 2017-02-20 DIAGNOSIS — N186 End stage renal disease: Secondary | ICD-10-CM | POA: Diagnosis not present

## 2017-02-23 DIAGNOSIS — N2581 Secondary hyperparathyroidism of renal origin: Secondary | ICD-10-CM | POA: Diagnosis not present

## 2017-02-23 DIAGNOSIS — N186 End stage renal disease: Secondary | ICD-10-CM | POA: Diagnosis not present

## 2017-02-23 DIAGNOSIS — I82409 Acute embolism and thrombosis of unspecified deep veins of unspecified lower extremity: Secondary | ICD-10-CM | POA: Diagnosis not present

## 2017-02-23 DIAGNOSIS — D631 Anemia in chronic kidney disease: Secondary | ICD-10-CM | POA: Diagnosis not present

## 2017-02-25 DIAGNOSIS — N2581 Secondary hyperparathyroidism of renal origin: Secondary | ICD-10-CM | POA: Diagnosis not present

## 2017-02-25 DIAGNOSIS — D631 Anemia in chronic kidney disease: Secondary | ICD-10-CM | POA: Diagnosis not present

## 2017-02-25 DIAGNOSIS — N186 End stage renal disease: Secondary | ICD-10-CM | POA: Diagnosis not present

## 2017-02-27 DIAGNOSIS — N186 End stage renal disease: Secondary | ICD-10-CM | POA: Diagnosis not present

## 2017-02-27 DIAGNOSIS — N2581 Secondary hyperparathyroidism of renal origin: Secondary | ICD-10-CM | POA: Diagnosis not present

## 2017-02-27 DIAGNOSIS — D631 Anemia in chronic kidney disease: Secondary | ICD-10-CM | POA: Diagnosis not present

## 2017-03-02 DIAGNOSIS — D631 Anemia in chronic kidney disease: Secondary | ICD-10-CM | POA: Diagnosis not present

## 2017-03-02 DIAGNOSIS — N186 End stage renal disease: Secondary | ICD-10-CM | POA: Diagnosis not present

## 2017-03-02 DIAGNOSIS — N2581 Secondary hyperparathyroidism of renal origin: Secondary | ICD-10-CM | POA: Diagnosis not present

## 2017-03-02 DIAGNOSIS — I82409 Acute embolism and thrombosis of unspecified deep veins of unspecified lower extremity: Secondary | ICD-10-CM | POA: Diagnosis not present

## 2017-03-04 DIAGNOSIS — N2581 Secondary hyperparathyroidism of renal origin: Secondary | ICD-10-CM | POA: Diagnosis not present

## 2017-03-04 DIAGNOSIS — N186 End stage renal disease: Secondary | ICD-10-CM | POA: Diagnosis not present

## 2017-03-04 DIAGNOSIS — D631 Anemia in chronic kidney disease: Secondary | ICD-10-CM | POA: Diagnosis not present

## 2017-03-05 ENCOUNTER — Ambulatory Visit (INDEPENDENT_AMBULATORY_CARE_PROVIDER_SITE_OTHER): Payer: Medicare HMO | Admitting: Family Medicine

## 2017-03-05 VITALS — BP 134/56 | HR 75 | Resp 18 | Wt 151.0 lb

## 2017-03-05 DIAGNOSIS — N186 End stage renal disease: Secondary | ICD-10-CM | POA: Diagnosis not present

## 2017-03-05 DIAGNOSIS — Z13220 Encounter for screening for lipoid disorders: Secondary | ICD-10-CM | POA: Diagnosis not present

## 2017-03-05 DIAGNOSIS — I10 Essential (primary) hypertension: Secondary | ICD-10-CM | POA: Diagnosis not present

## 2017-03-05 DIAGNOSIS — E782 Mixed hyperlipidemia: Secondary | ICD-10-CM | POA: Diagnosis not present

## 2017-03-05 DIAGNOSIS — Z992 Dependence on renal dialysis: Secondary | ICD-10-CM | POA: Diagnosis not present

## 2017-03-05 LAB — LIPID PANEL W/REFLEX DIRECT LDL
CHOL/HDL RATIO: 3.1 (calc) (ref <5.0)
Cholesterol: 131 mg/dL (ref <200)
HDL: 42 mg/dL — AB (ref >50)
LDL Cholesterol (Calc): 68 mg/dL (calc)
NON-HDL CHOLESTEROL (CALC): 89 mg/dL (ref <130)
Triglycerides: 126 mg/dL (ref <150)

## 2017-03-05 NOTE — Progress Notes (Signed)
HTN-blood pressure looks great today.  We will continue current regimen and keep regular follow-up.  [name], MD

## 2017-03-05 NOTE — Progress Notes (Signed)
   Subjective:    Patient ID: Joy Anderson, female    DOB: 23-May-1950, 67 y.o.   MRN: [id number]  HPI Patient here for BP follow up; denies headaches, dizziness, shortness of breath and chest pain. Walking with cane today.    Review of Systems     Objective:   Physical Exam        Assessment & Plan:  Patient will return as requested or on 08/20/17 for her annual physical. She has date of pneumonia vaccine 23 and will get any needed vaccine updates at that time.

## 2017-03-06 DIAGNOSIS — N2581 Secondary hyperparathyroidism of renal origin: Secondary | ICD-10-CM | POA: Diagnosis not present

## 2017-03-06 DIAGNOSIS — D509 Iron deficiency anemia, unspecified: Secondary | ICD-10-CM | POA: Diagnosis not present

## 2017-03-06 DIAGNOSIS — N186 End stage renal disease: Secondary | ICD-10-CM | POA: Diagnosis not present

## 2017-03-09 DIAGNOSIS — I82409 Acute embolism and thrombosis of unspecified deep veins of unspecified lower extremity: Secondary | ICD-10-CM | POA: Diagnosis not present

## 2017-03-09 DIAGNOSIS — N2581 Secondary hyperparathyroidism of renal origin: Secondary | ICD-10-CM | POA: Diagnosis not present

## 2017-03-09 DIAGNOSIS — N186 End stage renal disease: Secondary | ICD-10-CM | POA: Diagnosis not present

## 2017-03-09 DIAGNOSIS — D509 Iron deficiency anemia, unspecified: Secondary | ICD-10-CM | POA: Diagnosis not present

## 2017-03-11 DIAGNOSIS — E119 Type 2 diabetes mellitus without complications: Secondary | ICD-10-CM | POA: Diagnosis not present

## 2017-03-11 DIAGNOSIS — N186 End stage renal disease: Secondary | ICD-10-CM | POA: Diagnosis not present

## 2017-03-11 DIAGNOSIS — D509 Iron deficiency anemia, unspecified: Secondary | ICD-10-CM | POA: Diagnosis not present

## 2017-03-11 DIAGNOSIS — N2581 Secondary hyperparathyroidism of renal origin: Secondary | ICD-10-CM | POA: Diagnosis not present

## 2017-03-13 DIAGNOSIS — N2581 Secondary hyperparathyroidism of renal origin: Secondary | ICD-10-CM | POA: Diagnosis not present

## 2017-03-13 DIAGNOSIS — N186 End stage renal disease: Secondary | ICD-10-CM | POA: Diagnosis not present

## 2017-03-13 DIAGNOSIS — D509 Iron deficiency anemia, unspecified: Secondary | ICD-10-CM | POA: Diagnosis not present

## 2017-03-16 DIAGNOSIS — D631 Anemia in chronic kidney disease: Secondary | ICD-10-CM | POA: Diagnosis not present

## 2017-03-16 DIAGNOSIS — N2581 Secondary hyperparathyroidism of renal origin: Secondary | ICD-10-CM | POA: Diagnosis not present

## 2017-03-16 DIAGNOSIS — D509 Iron deficiency anemia, unspecified: Secondary | ICD-10-CM | POA: Diagnosis not present

## 2017-03-16 DIAGNOSIS — N186 End stage renal disease: Secondary | ICD-10-CM | POA: Diagnosis not present

## 2017-03-16 DIAGNOSIS — I82409 Acute embolism and thrombosis of unspecified deep veins of unspecified lower extremity: Secondary | ICD-10-CM | POA: Diagnosis not present

## 2017-03-18 DIAGNOSIS — D631 Anemia in chronic kidney disease: Secondary | ICD-10-CM | POA: Diagnosis not present

## 2017-03-18 DIAGNOSIS — N2581 Secondary hyperparathyroidism of renal origin: Secondary | ICD-10-CM | POA: Diagnosis not present

## 2017-03-18 DIAGNOSIS — N186 End stage renal disease: Secondary | ICD-10-CM | POA: Diagnosis not present

## 2017-03-18 DIAGNOSIS — D509 Iron deficiency anemia, unspecified: Secondary | ICD-10-CM | POA: Diagnosis not present

## 2017-03-20 DIAGNOSIS — D631 Anemia in chronic kidney disease: Secondary | ICD-10-CM | POA: Diagnosis not present

## 2017-03-20 DIAGNOSIS — D509 Iron deficiency anemia, unspecified: Secondary | ICD-10-CM | POA: Diagnosis not present

## 2017-03-20 DIAGNOSIS — N186 End stage renal disease: Secondary | ICD-10-CM | POA: Diagnosis not present

## 2017-03-20 DIAGNOSIS — N2581 Secondary hyperparathyroidism of renal origin: Secondary | ICD-10-CM | POA: Diagnosis not present

## 2017-03-23 DIAGNOSIS — N2581 Secondary hyperparathyroidism of renal origin: Secondary | ICD-10-CM | POA: Diagnosis not present

## 2017-03-23 DIAGNOSIS — N186 End stage renal disease: Secondary | ICD-10-CM | POA: Diagnosis not present

## 2017-03-23 DIAGNOSIS — I82409 Acute embolism and thrombosis of unspecified deep veins of unspecified lower extremity: Secondary | ICD-10-CM | POA: Diagnosis not present

## 2017-03-23 DIAGNOSIS — D509 Iron deficiency anemia, unspecified: Secondary | ICD-10-CM | POA: Diagnosis not present

## 2017-03-23 DIAGNOSIS — D631 Anemia in chronic kidney disease: Secondary | ICD-10-CM | POA: Diagnosis not present

## 2017-03-24 DIAGNOSIS — I7300 Raynaud's syndrome without gangrene: Secondary | ICD-10-CM | POA: Diagnosis not present

## 2017-03-24 DIAGNOSIS — R768 Other specified abnormal immunological findings in serum: Secondary | ICD-10-CM | POA: Diagnosis not present

## 2017-03-24 DIAGNOSIS — N186 End stage renal disease: Secondary | ICD-10-CM | POA: Diagnosis not present

## 2017-03-24 DIAGNOSIS — M329 Systemic lupus erythematosus, unspecified: Secondary | ICD-10-CM | POA: Diagnosis not present

## 2017-03-25 DIAGNOSIS — D509 Iron deficiency anemia, unspecified: Secondary | ICD-10-CM | POA: Diagnosis not present

## 2017-03-25 DIAGNOSIS — D631 Anemia in chronic kidney disease: Secondary | ICD-10-CM | POA: Diagnosis not present

## 2017-03-25 DIAGNOSIS — N2581 Secondary hyperparathyroidism of renal origin: Secondary | ICD-10-CM | POA: Diagnosis not present

## 2017-03-25 DIAGNOSIS — N186 End stage renal disease: Secondary | ICD-10-CM | POA: Diagnosis not present

## 2017-03-27 DIAGNOSIS — N2581 Secondary hyperparathyroidism of renal origin: Secondary | ICD-10-CM | POA: Diagnosis not present

## 2017-03-27 DIAGNOSIS — N186 End stage renal disease: Secondary | ICD-10-CM | POA: Diagnosis not present

## 2017-03-30 DIAGNOSIS — I82409 Acute embolism and thrombosis of unspecified deep veins of unspecified lower extremity: Secondary | ICD-10-CM | POA: Diagnosis not present

## 2017-03-30 DIAGNOSIS — N2581 Secondary hyperparathyroidism of renal origin: Secondary | ICD-10-CM | POA: Diagnosis not present

## 2017-03-30 DIAGNOSIS — N186 End stage renal disease: Secondary | ICD-10-CM | POA: Diagnosis not present

## 2017-04-01 DIAGNOSIS — N2581 Secondary hyperparathyroidism of renal origin: Secondary | ICD-10-CM | POA: Diagnosis not present

## 2017-04-01 DIAGNOSIS — N186 End stage renal disease: Secondary | ICD-10-CM | POA: Diagnosis not present

## 2017-04-03 DIAGNOSIS — N2581 Secondary hyperparathyroidism of renal origin: Secondary | ICD-10-CM | POA: Diagnosis not present

## 2017-04-03 DIAGNOSIS — N186 End stage renal disease: Secondary | ICD-10-CM | POA: Diagnosis not present

## 2017-04-05 DIAGNOSIS — Z992 Dependence on renal dialysis: Secondary | ICD-10-CM | POA: Diagnosis not present

## 2017-04-05 DIAGNOSIS — N186 End stage renal disease: Secondary | ICD-10-CM | POA: Diagnosis not present

## 2017-04-06 DIAGNOSIS — D631 Anemia in chronic kidney disease: Secondary | ICD-10-CM | POA: Diagnosis not present

## 2017-04-06 DIAGNOSIS — N2581 Secondary hyperparathyroidism of renal origin: Secondary | ICD-10-CM | POA: Diagnosis not present

## 2017-04-06 DIAGNOSIS — N186 End stage renal disease: Secondary | ICD-10-CM | POA: Diagnosis not present

## 2017-04-06 DIAGNOSIS — D509 Iron deficiency anemia, unspecified: Secondary | ICD-10-CM | POA: Diagnosis not present

## 2017-04-06 DIAGNOSIS — I82409 Acute embolism and thrombosis of unspecified deep veins of unspecified lower extremity: Secondary | ICD-10-CM | POA: Diagnosis not present

## 2017-04-08 DIAGNOSIS — D509 Iron deficiency anemia, unspecified: Secondary | ICD-10-CM | POA: Diagnosis not present

## 2017-04-08 DIAGNOSIS — D631 Anemia in chronic kidney disease: Secondary | ICD-10-CM | POA: Diagnosis not present

## 2017-04-08 DIAGNOSIS — E119 Type 2 diabetes mellitus without complications: Secondary | ICD-10-CM | POA: Diagnosis not present

## 2017-04-08 DIAGNOSIS — N186 End stage renal disease: Secondary | ICD-10-CM | POA: Diagnosis not present

## 2017-04-08 DIAGNOSIS — N2581 Secondary hyperparathyroidism of renal origin: Secondary | ICD-10-CM | POA: Diagnosis not present

## 2017-04-10 DIAGNOSIS — N2581 Secondary hyperparathyroidism of renal origin: Secondary | ICD-10-CM | POA: Diagnosis not present

## 2017-04-10 DIAGNOSIS — D631 Anemia in chronic kidney disease: Secondary | ICD-10-CM | POA: Diagnosis not present

## 2017-04-10 DIAGNOSIS — N186 End stage renal disease: Secondary | ICD-10-CM | POA: Diagnosis not present

## 2017-04-10 DIAGNOSIS — D509 Iron deficiency anemia, unspecified: Secondary | ICD-10-CM | POA: Diagnosis not present

## 2017-04-13 DIAGNOSIS — D631 Anemia in chronic kidney disease: Secondary | ICD-10-CM | POA: Diagnosis not present

## 2017-04-13 DIAGNOSIS — N2581 Secondary hyperparathyroidism of renal origin: Secondary | ICD-10-CM | POA: Diagnosis not present

## 2017-04-13 DIAGNOSIS — N186 End stage renal disease: Secondary | ICD-10-CM | POA: Diagnosis not present

## 2017-04-13 DIAGNOSIS — E782 Mixed hyperlipidemia: Secondary | ICD-10-CM | POA: Insufficient documentation

## 2017-04-13 DIAGNOSIS — D509 Iron deficiency anemia, unspecified: Secondary | ICD-10-CM | POA: Diagnosis not present

## 2017-04-13 DIAGNOSIS — I82409 Acute embolism and thrombosis of unspecified deep veins of unspecified lower extremity: Secondary | ICD-10-CM | POA: Diagnosis not present

## 2017-04-15 DIAGNOSIS — N2581 Secondary hyperparathyroidism of renal origin: Secondary | ICD-10-CM | POA: Diagnosis not present

## 2017-04-15 DIAGNOSIS — D509 Iron deficiency anemia, unspecified: Secondary | ICD-10-CM | POA: Diagnosis not present

## 2017-04-15 DIAGNOSIS — D631 Anemia in chronic kidney disease: Secondary | ICD-10-CM | POA: Diagnosis not present

## 2017-04-15 DIAGNOSIS — N186 End stage renal disease: Secondary | ICD-10-CM | POA: Diagnosis not present

## 2017-04-17 DIAGNOSIS — N186 End stage renal disease: Secondary | ICD-10-CM | POA: Diagnosis not present

## 2017-04-17 DIAGNOSIS — N2581 Secondary hyperparathyroidism of renal origin: Secondary | ICD-10-CM | POA: Diagnosis not present

## 2017-04-20 DIAGNOSIS — N186 End stage renal disease: Secondary | ICD-10-CM | POA: Diagnosis not present

## 2017-04-20 DIAGNOSIS — N2581 Secondary hyperparathyroidism of renal origin: Secondary | ICD-10-CM | POA: Diagnosis not present

## 2017-04-20 DIAGNOSIS — I82409 Acute embolism and thrombosis of unspecified deep veins of unspecified lower extremity: Secondary | ICD-10-CM | POA: Diagnosis not present

## 2017-04-22 DIAGNOSIS — N2581 Secondary hyperparathyroidism of renal origin: Secondary | ICD-10-CM | POA: Diagnosis not present

## 2017-04-22 DIAGNOSIS — N186 End stage renal disease: Secondary | ICD-10-CM | POA: Diagnosis not present

## 2017-04-24 DIAGNOSIS — N186 End stage renal disease: Secondary | ICD-10-CM | POA: Diagnosis not present

## 2017-04-24 DIAGNOSIS — N2581 Secondary hyperparathyroidism of renal origin: Secondary | ICD-10-CM | POA: Diagnosis not present

## 2017-04-27 DIAGNOSIS — D631 Anemia in chronic kidney disease: Secondary | ICD-10-CM | POA: Diagnosis not present

## 2017-04-27 DIAGNOSIS — N186 End stage renal disease: Secondary | ICD-10-CM | POA: Diagnosis not present

## 2017-04-27 DIAGNOSIS — I82409 Acute embolism and thrombosis of unspecified deep veins of unspecified lower extremity: Secondary | ICD-10-CM | POA: Diagnosis not present

## 2017-04-27 DIAGNOSIS — N2581 Secondary hyperparathyroidism of renal origin: Secondary | ICD-10-CM | POA: Diagnosis not present

## 2017-04-29 DIAGNOSIS — N2581 Secondary hyperparathyroidism of renal origin: Secondary | ICD-10-CM | POA: Diagnosis not present

## 2017-04-29 DIAGNOSIS — D631 Anemia in chronic kidney disease: Secondary | ICD-10-CM | POA: Diagnosis not present

## 2017-04-29 DIAGNOSIS — N186 End stage renal disease: Secondary | ICD-10-CM | POA: Diagnosis not present

## 2017-05-01 DIAGNOSIS — N186 End stage renal disease: Secondary | ICD-10-CM | POA: Diagnosis not present

## 2017-05-01 DIAGNOSIS — D631 Anemia in chronic kidney disease: Secondary | ICD-10-CM | POA: Diagnosis not present

## 2017-05-01 DIAGNOSIS — N2581 Secondary hyperparathyroidism of renal origin: Secondary | ICD-10-CM | POA: Diagnosis not present

## 2017-05-04 DIAGNOSIS — D631 Anemia in chronic kidney disease: Secondary | ICD-10-CM | POA: Diagnosis not present

## 2017-05-04 DIAGNOSIS — N2581 Secondary hyperparathyroidism of renal origin: Secondary | ICD-10-CM | POA: Diagnosis not present

## 2017-05-04 DIAGNOSIS — N186 End stage renal disease: Secondary | ICD-10-CM | POA: Diagnosis not present

## 2017-05-04 DIAGNOSIS — I82409 Acute embolism and thrombosis of unspecified deep veins of unspecified lower extremity: Secondary | ICD-10-CM | POA: Diagnosis not present

## 2017-05-05 DIAGNOSIS — N186 End stage renal disease: Secondary | ICD-10-CM | POA: Diagnosis not present

## 2017-05-05 DIAGNOSIS — Z992 Dependence on renal dialysis: Secondary | ICD-10-CM | POA: Diagnosis not present

## 2017-05-06 DIAGNOSIS — N186 End stage renal disease: Secondary | ICD-10-CM | POA: Diagnosis not present

## 2017-05-06 DIAGNOSIS — E119 Type 2 diabetes mellitus without complications: Secondary | ICD-10-CM | POA: Diagnosis not present

## 2017-05-06 DIAGNOSIS — D509 Iron deficiency anemia, unspecified: Secondary | ICD-10-CM | POA: Diagnosis not present

## 2017-05-06 DIAGNOSIS — N2581 Secondary hyperparathyroidism of renal origin: Secondary | ICD-10-CM | POA: Diagnosis not present

## 2017-05-06 DIAGNOSIS — D631 Anemia in chronic kidney disease: Secondary | ICD-10-CM | POA: Diagnosis not present

## 2017-05-08 DIAGNOSIS — N2581 Secondary hyperparathyroidism of renal origin: Secondary | ICD-10-CM | POA: Diagnosis not present

## 2017-05-08 DIAGNOSIS — D509 Iron deficiency anemia, unspecified: Secondary | ICD-10-CM | POA: Diagnosis not present

## 2017-05-08 DIAGNOSIS — N186 End stage renal disease: Secondary | ICD-10-CM | POA: Diagnosis not present

## 2017-05-08 DIAGNOSIS — D631 Anemia in chronic kidney disease: Secondary | ICD-10-CM | POA: Diagnosis not present

## 2017-05-11 DIAGNOSIS — N186 End stage renal disease: Secondary | ICD-10-CM | POA: Diagnosis not present

## 2017-05-11 DIAGNOSIS — N2581 Secondary hyperparathyroidism of renal origin: Secondary | ICD-10-CM | POA: Diagnosis not present

## 2017-05-11 DIAGNOSIS — I82409 Acute embolism and thrombosis of unspecified deep veins of unspecified lower extremity: Secondary | ICD-10-CM | POA: Diagnosis not present

## 2017-05-11 DIAGNOSIS — D631 Anemia in chronic kidney disease: Secondary | ICD-10-CM | POA: Diagnosis not present

## 2017-05-11 DIAGNOSIS — D509 Iron deficiency anemia, unspecified: Secondary | ICD-10-CM | POA: Diagnosis not present

## 2017-05-13 DIAGNOSIS — D631 Anemia in chronic kidney disease: Secondary | ICD-10-CM | POA: Diagnosis not present

## 2017-05-13 DIAGNOSIS — N2581 Secondary hyperparathyroidism of renal origin: Secondary | ICD-10-CM | POA: Diagnosis not present

## 2017-05-13 DIAGNOSIS — N186 End stage renal disease: Secondary | ICD-10-CM | POA: Diagnosis not present

## 2017-05-13 DIAGNOSIS — D509 Iron deficiency anemia, unspecified: Secondary | ICD-10-CM | POA: Diagnosis not present

## 2017-05-15 DIAGNOSIS — N2581 Secondary hyperparathyroidism of renal origin: Secondary | ICD-10-CM | POA: Diagnosis not present

## 2017-05-15 DIAGNOSIS — D509 Iron deficiency anemia, unspecified: Secondary | ICD-10-CM | POA: Diagnosis not present

## 2017-05-15 DIAGNOSIS — N186 End stage renal disease: Secondary | ICD-10-CM | POA: Diagnosis not present

## 2017-05-15 DIAGNOSIS — D631 Anemia in chronic kidney disease: Secondary | ICD-10-CM | POA: Diagnosis not present

## 2017-05-18 DIAGNOSIS — D509 Iron deficiency anemia, unspecified: Secondary | ICD-10-CM | POA: Diagnosis not present

## 2017-05-18 DIAGNOSIS — N186 End stage renal disease: Secondary | ICD-10-CM | POA: Diagnosis not present

## 2017-05-18 DIAGNOSIS — I82409 Acute embolism and thrombosis of unspecified deep veins of unspecified lower extremity: Secondary | ICD-10-CM | POA: Diagnosis not present

## 2017-05-18 DIAGNOSIS — N2581 Secondary hyperparathyroidism of renal origin: Secondary | ICD-10-CM | POA: Diagnosis not present

## 2017-05-20 DIAGNOSIS — N186 End stage renal disease: Secondary | ICD-10-CM | POA: Diagnosis not present

## 2017-05-20 DIAGNOSIS — N2581 Secondary hyperparathyroidism of renal origin: Secondary | ICD-10-CM | POA: Diagnosis not present

## 2017-05-20 DIAGNOSIS — D509 Iron deficiency anemia, unspecified: Secondary | ICD-10-CM | POA: Diagnosis not present

## 2017-05-22 DIAGNOSIS — N186 End stage renal disease: Secondary | ICD-10-CM | POA: Diagnosis not present

## 2017-05-22 DIAGNOSIS — D509 Iron deficiency anemia, unspecified: Secondary | ICD-10-CM | POA: Diagnosis not present

## 2017-05-22 DIAGNOSIS — N2581 Secondary hyperparathyroidism of renal origin: Secondary | ICD-10-CM | POA: Diagnosis not present

## 2017-05-25 DIAGNOSIS — I82409 Acute embolism and thrombosis of unspecified deep veins of unspecified lower extremity: Secondary | ICD-10-CM | POA: Diagnosis not present

## 2017-05-25 DIAGNOSIS — D509 Iron deficiency anemia, unspecified: Secondary | ICD-10-CM | POA: Diagnosis not present

## 2017-05-25 DIAGNOSIS — N2581 Secondary hyperparathyroidism of renal origin: Secondary | ICD-10-CM | POA: Diagnosis not present

## 2017-05-25 DIAGNOSIS — N186 End stage renal disease: Secondary | ICD-10-CM | POA: Diagnosis not present

## 2017-05-27 DIAGNOSIS — D631 Anemia in chronic kidney disease: Secondary | ICD-10-CM | POA: Diagnosis not present

## 2017-05-27 DIAGNOSIS — N2581 Secondary hyperparathyroidism of renal origin: Secondary | ICD-10-CM | POA: Diagnosis not present

## 2017-05-27 DIAGNOSIS — N186 End stage renal disease: Secondary | ICD-10-CM | POA: Diagnosis not present

## 2017-05-29 DIAGNOSIS — N186 End stage renal disease: Secondary | ICD-10-CM | POA: Diagnosis not present

## 2017-05-29 DIAGNOSIS — D631 Anemia in chronic kidney disease: Secondary | ICD-10-CM | POA: Diagnosis not present

## 2017-05-29 DIAGNOSIS — N2581 Secondary hyperparathyroidism of renal origin: Secondary | ICD-10-CM | POA: Diagnosis not present

## 2017-06-01 DIAGNOSIS — N2581 Secondary hyperparathyroidism of renal origin: Secondary | ICD-10-CM | POA: Diagnosis not present

## 2017-06-01 DIAGNOSIS — I82409 Acute embolism and thrombosis of unspecified deep veins of unspecified lower extremity: Secondary | ICD-10-CM | POA: Diagnosis not present

## 2017-06-01 DIAGNOSIS — D631 Anemia in chronic kidney disease: Secondary | ICD-10-CM | POA: Diagnosis not present

## 2017-06-01 DIAGNOSIS — N186 End stage renal disease: Secondary | ICD-10-CM | POA: Diagnosis not present

## 2017-06-03 DIAGNOSIS — N186 End stage renal disease: Secondary | ICD-10-CM | POA: Diagnosis not present

## 2017-06-03 DIAGNOSIS — N2581 Secondary hyperparathyroidism of renal origin: Secondary | ICD-10-CM | POA: Diagnosis not present

## 2017-06-03 DIAGNOSIS — D631 Anemia in chronic kidney disease: Secondary | ICD-10-CM | POA: Diagnosis not present

## 2017-06-05 DIAGNOSIS — N186 End stage renal disease: Secondary | ICD-10-CM | POA: Diagnosis not present

## 2017-06-05 DIAGNOSIS — D631 Anemia in chronic kidney disease: Secondary | ICD-10-CM | POA: Diagnosis not present

## 2017-06-05 DIAGNOSIS — N2581 Secondary hyperparathyroidism of renal origin: Secondary | ICD-10-CM | POA: Diagnosis not present

## 2017-06-05 DIAGNOSIS — Z992 Dependence on renal dialysis: Secondary | ICD-10-CM | POA: Diagnosis not present

## 2017-06-06 DIAGNOSIS — N186 End stage renal disease: Secondary | ICD-10-CM | POA: Diagnosis not present

## 2017-06-06 DIAGNOSIS — D509 Iron deficiency anemia, unspecified: Secondary | ICD-10-CM | POA: Diagnosis not present

## 2017-06-06 DIAGNOSIS — N2581 Secondary hyperparathyroidism of renal origin: Secondary | ICD-10-CM | POA: Diagnosis not present

## 2017-06-08 DIAGNOSIS — N186 End stage renal disease: Secondary | ICD-10-CM | POA: Diagnosis not present

## 2017-06-08 DIAGNOSIS — I82409 Acute embolism and thrombosis of unspecified deep veins of unspecified lower extremity: Secondary | ICD-10-CM | POA: Diagnosis not present

## 2017-06-08 DIAGNOSIS — N2581 Secondary hyperparathyroidism of renal origin: Secondary | ICD-10-CM | POA: Diagnosis not present

## 2017-06-08 DIAGNOSIS — D509 Iron deficiency anemia, unspecified: Secondary | ICD-10-CM | POA: Diagnosis not present

## 2017-06-10 DIAGNOSIS — N2581 Secondary hyperparathyroidism of renal origin: Secondary | ICD-10-CM | POA: Diagnosis not present

## 2017-06-10 DIAGNOSIS — D509 Iron deficiency anemia, unspecified: Secondary | ICD-10-CM | POA: Diagnosis not present

## 2017-06-10 DIAGNOSIS — N186 End stage renal disease: Secondary | ICD-10-CM | POA: Diagnosis not present

## 2017-06-10 DIAGNOSIS — E119 Type 2 diabetes mellitus without complications: Secondary | ICD-10-CM | POA: Diagnosis not present

## 2017-06-12 DIAGNOSIS — N2581 Secondary hyperparathyroidism of renal origin: Secondary | ICD-10-CM | POA: Diagnosis not present

## 2017-06-12 DIAGNOSIS — D509 Iron deficiency anemia, unspecified: Secondary | ICD-10-CM | POA: Diagnosis not present

## 2017-06-12 DIAGNOSIS — N186 End stage renal disease: Secondary | ICD-10-CM | POA: Diagnosis not present

## 2017-06-15 DIAGNOSIS — D509 Iron deficiency anemia, unspecified: Secondary | ICD-10-CM | POA: Diagnosis not present

## 2017-06-15 DIAGNOSIS — N186 End stage renal disease: Secondary | ICD-10-CM | POA: Diagnosis not present

## 2017-06-15 DIAGNOSIS — I82409 Acute embolism and thrombosis of unspecified deep veins of unspecified lower extremity: Secondary | ICD-10-CM | POA: Diagnosis not present

## 2017-06-15 DIAGNOSIS — N2581 Secondary hyperparathyroidism of renal origin: Secondary | ICD-10-CM | POA: Diagnosis not present

## 2017-06-17 DIAGNOSIS — D631 Anemia in chronic kidney disease: Secondary | ICD-10-CM | POA: Diagnosis not present

## 2017-06-17 DIAGNOSIS — N186 End stage renal disease: Secondary | ICD-10-CM | POA: Diagnosis not present

## 2017-06-17 DIAGNOSIS — N2581 Secondary hyperparathyroidism of renal origin: Secondary | ICD-10-CM | POA: Diagnosis not present

## 2017-06-19 DIAGNOSIS — N186 End stage renal disease: Secondary | ICD-10-CM | POA: Diagnosis not present

## 2017-06-19 DIAGNOSIS — N2581 Secondary hyperparathyroidism of renal origin: Secondary | ICD-10-CM | POA: Diagnosis not present

## 2017-06-19 DIAGNOSIS — D631 Anemia in chronic kidney disease: Secondary | ICD-10-CM | POA: Diagnosis not present

## 2017-06-22 DIAGNOSIS — I82409 Acute embolism and thrombosis of unspecified deep veins of unspecified lower extremity: Secondary | ICD-10-CM | POA: Diagnosis not present

## 2017-06-22 DIAGNOSIS — N186 End stage renal disease: Secondary | ICD-10-CM | POA: Diagnosis not present

## 2017-06-22 DIAGNOSIS — D631 Anemia in chronic kidney disease: Secondary | ICD-10-CM | POA: Diagnosis not present

## 2017-06-22 DIAGNOSIS — N2581 Secondary hyperparathyroidism of renal origin: Secondary | ICD-10-CM | POA: Diagnosis not present

## 2017-06-24 DIAGNOSIS — N186 End stage renal disease: Secondary | ICD-10-CM | POA: Diagnosis not present

## 2017-06-24 DIAGNOSIS — N2581 Secondary hyperparathyroidism of renal origin: Secondary | ICD-10-CM | POA: Diagnosis not present

## 2017-06-24 DIAGNOSIS — D631 Anemia in chronic kidney disease: Secondary | ICD-10-CM | POA: Diagnosis not present

## 2017-06-26 DIAGNOSIS — D631 Anemia in chronic kidney disease: Secondary | ICD-10-CM | POA: Diagnosis not present

## 2017-06-26 DIAGNOSIS — N186 End stage renal disease: Secondary | ICD-10-CM | POA: Diagnosis not present

## 2017-06-26 DIAGNOSIS — N2581 Secondary hyperparathyroidism of renal origin: Secondary | ICD-10-CM | POA: Diagnosis not present

## 2017-06-29 DIAGNOSIS — N186 End stage renal disease: Secondary | ICD-10-CM | POA: Diagnosis not present

## 2017-06-29 DIAGNOSIS — I82409 Acute embolism and thrombosis of unspecified deep veins of unspecified lower extremity: Secondary | ICD-10-CM | POA: Diagnosis not present

## 2017-06-29 DIAGNOSIS — N2581 Secondary hyperparathyroidism of renal origin: Secondary | ICD-10-CM | POA: Diagnosis not present

## 2017-06-29 DIAGNOSIS — D631 Anemia in chronic kidney disease: Secondary | ICD-10-CM | POA: Diagnosis not present

## 2017-07-01 DIAGNOSIS — D631 Anemia in chronic kidney disease: Secondary | ICD-10-CM | POA: Diagnosis not present

## 2017-07-01 DIAGNOSIS — N2581 Secondary hyperparathyroidism of renal origin: Secondary | ICD-10-CM | POA: Diagnosis not present

## 2017-07-01 DIAGNOSIS — N186 End stage renal disease: Secondary | ICD-10-CM | POA: Diagnosis not present

## 2017-07-03 DIAGNOSIS — N186 End stage renal disease: Secondary | ICD-10-CM | POA: Diagnosis not present

## 2017-07-03 DIAGNOSIS — N2581 Secondary hyperparathyroidism of renal origin: Secondary | ICD-10-CM | POA: Diagnosis not present

## 2017-07-03 DIAGNOSIS — D631 Anemia in chronic kidney disease: Secondary | ICD-10-CM | POA: Diagnosis not present

## 2017-07-05 DIAGNOSIS — Z992 Dependence on renal dialysis: Secondary | ICD-10-CM | POA: Diagnosis not present

## 2017-07-05 DIAGNOSIS — N186 End stage renal disease: Secondary | ICD-10-CM | POA: Diagnosis not present

## 2017-07-06 DIAGNOSIS — N2581 Secondary hyperparathyroidism of renal origin: Secondary | ICD-10-CM | POA: Diagnosis not present

## 2017-07-06 DIAGNOSIS — I82409 Acute embolism and thrombosis of unspecified deep veins of unspecified lower extremity: Secondary | ICD-10-CM | POA: Diagnosis not present

## 2017-07-06 DIAGNOSIS — N186 End stage renal disease: Secondary | ICD-10-CM | POA: Diagnosis not present

## 2017-07-06 DIAGNOSIS — D631 Anemia in chronic kidney disease: Secondary | ICD-10-CM | POA: Diagnosis not present

## 2017-07-06 DIAGNOSIS — D509 Iron deficiency anemia, unspecified: Secondary | ICD-10-CM | POA: Diagnosis not present

## 2017-07-08 DIAGNOSIS — N2581 Secondary hyperparathyroidism of renal origin: Secondary | ICD-10-CM | POA: Diagnosis not present

## 2017-07-08 DIAGNOSIS — E119 Type 2 diabetes mellitus without complications: Secondary | ICD-10-CM | POA: Diagnosis not present

## 2017-07-08 DIAGNOSIS — N186 End stage renal disease: Secondary | ICD-10-CM | POA: Diagnosis not present

## 2017-07-08 DIAGNOSIS — D631 Anemia in chronic kidney disease: Secondary | ICD-10-CM | POA: Diagnosis not present

## 2017-07-08 DIAGNOSIS — D509 Iron deficiency anemia, unspecified: Secondary | ICD-10-CM | POA: Diagnosis not present

## 2017-07-10 DIAGNOSIS — N2581 Secondary hyperparathyroidism of renal origin: Secondary | ICD-10-CM | POA: Diagnosis not present

## 2017-07-10 DIAGNOSIS — N186 End stage renal disease: Secondary | ICD-10-CM | POA: Diagnosis not present

## 2017-07-10 DIAGNOSIS — D631 Anemia in chronic kidney disease: Secondary | ICD-10-CM | POA: Diagnosis not present

## 2017-07-10 DIAGNOSIS — D509 Iron deficiency anemia, unspecified: Secondary | ICD-10-CM | POA: Diagnosis not present

## 2017-07-13 DIAGNOSIS — D509 Iron deficiency anemia, unspecified: Secondary | ICD-10-CM | POA: Diagnosis not present

## 2017-07-13 DIAGNOSIS — N186 End stage renal disease: Secondary | ICD-10-CM | POA: Diagnosis not present

## 2017-07-13 DIAGNOSIS — I82409 Acute embolism and thrombosis of unspecified deep veins of unspecified lower extremity: Secondary | ICD-10-CM | POA: Diagnosis not present

## 2017-07-13 DIAGNOSIS — D631 Anemia in chronic kidney disease: Secondary | ICD-10-CM | POA: Diagnosis not present

## 2017-07-13 DIAGNOSIS — N2581 Secondary hyperparathyroidism of renal origin: Secondary | ICD-10-CM | POA: Diagnosis not present

## 2017-07-15 DIAGNOSIS — D509 Iron deficiency anemia, unspecified: Secondary | ICD-10-CM | POA: Diagnosis not present

## 2017-07-15 DIAGNOSIS — N2581 Secondary hyperparathyroidism of renal origin: Secondary | ICD-10-CM | POA: Diagnosis not present

## 2017-07-15 DIAGNOSIS — N186 End stage renal disease: Secondary | ICD-10-CM | POA: Diagnosis not present

## 2017-07-15 DIAGNOSIS — D631 Anemia in chronic kidney disease: Secondary | ICD-10-CM | POA: Diagnosis not present

## 2017-07-17 ENCOUNTER — Encounter: Payer: Self-pay | Admitting: Family Medicine

## 2017-07-17 DIAGNOSIS — N2581 Secondary hyperparathyroidism of renal origin: Secondary | ICD-10-CM | POA: Diagnosis not present

## 2017-07-17 DIAGNOSIS — N186 End stage renal disease: Secondary | ICD-10-CM | POA: Diagnosis not present

## 2017-07-20 DIAGNOSIS — N186 End stage renal disease: Secondary | ICD-10-CM | POA: Diagnosis not present

## 2017-07-20 DIAGNOSIS — N2581 Secondary hyperparathyroidism of renal origin: Secondary | ICD-10-CM | POA: Diagnosis not present

## 2017-07-20 DIAGNOSIS — I82409 Acute embolism and thrombosis of unspecified deep veins of unspecified lower extremity: Secondary | ICD-10-CM | POA: Diagnosis not present

## 2017-07-22 DIAGNOSIS — N186 End stage renal disease: Secondary | ICD-10-CM | POA: Diagnosis not present

## 2017-07-22 DIAGNOSIS — N2581 Secondary hyperparathyroidism of renal origin: Secondary | ICD-10-CM | POA: Diagnosis not present

## 2017-07-24 DIAGNOSIS — N2581 Secondary hyperparathyroidism of renal origin: Secondary | ICD-10-CM | POA: Diagnosis not present

## 2017-07-24 DIAGNOSIS — N186 End stage renal disease: Secondary | ICD-10-CM | POA: Diagnosis not present

## 2017-07-27 DIAGNOSIS — N186 End stage renal disease: Secondary | ICD-10-CM | POA: Diagnosis not present

## 2017-07-27 DIAGNOSIS — N2581 Secondary hyperparathyroidism of renal origin: Secondary | ICD-10-CM | POA: Diagnosis not present

## 2017-07-27 DIAGNOSIS — D631 Anemia in chronic kidney disease: Secondary | ICD-10-CM | POA: Diagnosis not present

## 2017-07-27 DIAGNOSIS — I82409 Acute embolism and thrombosis of unspecified deep veins of unspecified lower extremity: Secondary | ICD-10-CM | POA: Diagnosis not present

## 2017-07-29 DIAGNOSIS — D631 Anemia in chronic kidney disease: Secondary | ICD-10-CM | POA: Diagnosis not present

## 2017-07-29 DIAGNOSIS — N186 End stage renal disease: Secondary | ICD-10-CM | POA: Diagnosis not present

## 2017-07-29 DIAGNOSIS — N2581 Secondary hyperparathyroidism of renal origin: Secondary | ICD-10-CM | POA: Diagnosis not present

## 2017-07-31 DIAGNOSIS — N2581 Secondary hyperparathyroidism of renal origin: Secondary | ICD-10-CM | POA: Diagnosis not present

## 2017-07-31 DIAGNOSIS — D631 Anemia in chronic kidney disease: Secondary | ICD-10-CM | POA: Diagnosis not present

## 2017-07-31 DIAGNOSIS — N186 End stage renal disease: Secondary | ICD-10-CM | POA: Diagnosis not present

## 2017-08-03 DIAGNOSIS — I82409 Acute embolism and thrombosis of unspecified deep veins of unspecified lower extremity: Secondary | ICD-10-CM | POA: Diagnosis not present

## 2017-08-03 DIAGNOSIS — N2581 Secondary hyperparathyroidism of renal origin: Secondary | ICD-10-CM | POA: Diagnosis not present

## 2017-08-03 DIAGNOSIS — D631 Anemia in chronic kidney disease: Secondary | ICD-10-CM | POA: Diagnosis not present

## 2017-08-03 DIAGNOSIS — N186 End stage renal disease: Secondary | ICD-10-CM | POA: Diagnosis not present

## 2017-08-05 DIAGNOSIS — N2581 Secondary hyperparathyroidism of renal origin: Secondary | ICD-10-CM | POA: Diagnosis not present

## 2017-08-05 DIAGNOSIS — N186 End stage renal disease: Secondary | ICD-10-CM | POA: Diagnosis not present

## 2017-08-05 DIAGNOSIS — Z992 Dependence on renal dialysis: Secondary | ICD-10-CM | POA: Diagnosis not present

## 2017-08-05 DIAGNOSIS — D631 Anemia in chronic kidney disease: Secondary | ICD-10-CM | POA: Diagnosis not present

## 2017-08-07 DIAGNOSIS — N186 End stage renal disease: Secondary | ICD-10-CM | POA: Diagnosis not present

## 2017-08-07 DIAGNOSIS — D509 Iron deficiency anemia, unspecified: Secondary | ICD-10-CM | POA: Diagnosis not present

## 2017-08-07 DIAGNOSIS — N2581 Secondary hyperparathyroidism of renal origin: Secondary | ICD-10-CM | POA: Diagnosis not present

## 2017-08-10 DIAGNOSIS — N2581 Secondary hyperparathyroidism of renal origin: Secondary | ICD-10-CM | POA: Diagnosis not present

## 2017-08-10 DIAGNOSIS — N186 End stage renal disease: Secondary | ICD-10-CM | POA: Diagnosis not present

## 2017-08-10 DIAGNOSIS — D509 Iron deficiency anemia, unspecified: Secondary | ICD-10-CM | POA: Diagnosis not present

## 2017-08-12 DIAGNOSIS — N2581 Secondary hyperparathyroidism of renal origin: Secondary | ICD-10-CM | POA: Diagnosis not present

## 2017-08-12 DIAGNOSIS — D509 Iron deficiency anemia, unspecified: Secondary | ICD-10-CM | POA: Diagnosis not present

## 2017-08-12 DIAGNOSIS — N186 End stage renal disease: Secondary | ICD-10-CM | POA: Diagnosis not present

## 2017-08-12 DIAGNOSIS — E119 Type 2 diabetes mellitus without complications: Secondary | ICD-10-CM | POA: Diagnosis not present

## 2017-08-12 LAB — BASIC METABOLIC PANEL
CREATININE: 10.3 — AB (ref 0.5–1.1)
Glucose: 105
Potassium: 3.6 (ref 3.4–5.3)
Sodium: 138 (ref 137–147)

## 2017-08-12 LAB — CBC AND DIFFERENTIAL
HCT: 33 — AB (ref 36–46)
HEMOGLOBIN: 10.9 — AB (ref 12.0–16.0)
PLATELETS: 191 (ref 150–399)
WBC: 6.2

## 2017-08-12 LAB — IRON,TIBC AND FERRITIN PANEL
FERRITIN: 790
IRON: 57

## 2017-08-12 LAB — VITAMIN D 25 HYDROXY (VIT D DEFICIENCY, FRACTURES): Vit D, 25-Hydroxy: 9.4

## 2017-08-12 LAB — HEPATIC FUNCTION PANEL
ALK PHOS: 73 (ref 25–125)
AST: 14 (ref 13–35)

## 2017-08-14 DIAGNOSIS — N186 End stage renal disease: Secondary | ICD-10-CM | POA: Diagnosis not present

## 2017-08-14 DIAGNOSIS — N2581 Secondary hyperparathyroidism of renal origin: Secondary | ICD-10-CM | POA: Diagnosis not present

## 2017-08-14 DIAGNOSIS — D509 Iron deficiency anemia, unspecified: Secondary | ICD-10-CM | POA: Diagnosis not present

## 2017-08-17 DIAGNOSIS — N2581 Secondary hyperparathyroidism of renal origin: Secondary | ICD-10-CM | POA: Diagnosis not present

## 2017-08-17 DIAGNOSIS — D509 Iron deficiency anemia, unspecified: Secondary | ICD-10-CM | POA: Diagnosis not present

## 2017-08-17 DIAGNOSIS — I82409 Acute embolism and thrombosis of unspecified deep veins of unspecified lower extremity: Secondary | ICD-10-CM | POA: Diagnosis not present

## 2017-08-17 DIAGNOSIS — N186 End stage renal disease: Secondary | ICD-10-CM | POA: Diagnosis not present

## 2017-08-17 DIAGNOSIS — D631 Anemia in chronic kidney disease: Secondary | ICD-10-CM | POA: Diagnosis not present

## 2017-08-19 DIAGNOSIS — N186 End stage renal disease: Secondary | ICD-10-CM | POA: Diagnosis not present

## 2017-08-19 DIAGNOSIS — D509 Iron deficiency anemia, unspecified: Secondary | ICD-10-CM | POA: Diagnosis not present

## 2017-08-19 DIAGNOSIS — N2581 Secondary hyperparathyroidism of renal origin: Secondary | ICD-10-CM | POA: Diagnosis not present

## 2017-08-19 DIAGNOSIS — D631 Anemia in chronic kidney disease: Secondary | ICD-10-CM | POA: Diagnosis not present

## 2017-08-20 ENCOUNTER — Encounter: Payer: Self-pay | Admitting: Family Medicine

## 2017-08-20 ENCOUNTER — Ambulatory Visit (INDEPENDENT_AMBULATORY_CARE_PROVIDER_SITE_OTHER): Payer: Medicare HMO | Admitting: Family Medicine

## 2017-08-20 VITALS — BP 118/55 | HR 76 | Ht 62.0 in | Wt 151.0 lb

## 2017-08-20 DIAGNOSIS — Z0000 Encounter for general adult medical examination without abnormal findings: Secondary | ICD-10-CM | POA: Diagnosis not present

## 2017-08-20 MED ORDER — HYDROCORTISONE 2.5 % EX CREA: 1.0000 "application " | TOPICAL_CREAM | Freq: Every day | CUTANEOUS | 1 refills | Status: DC | PRN

## 2017-08-20 NOTE — Progress Notes (Signed)
Subjective:   DAILYNN Anderson is a 67 y.o. female who presents for Medicare Annual (Subsequent) preventive examination.  Review of Systems:  Comprehensive ROS is negative.        Objective:     Vitals: BP (!) 118/55   Pulse 76   Ht '5\' 2"'$  (1.575 m)   Wt 151 lb (68.5 kg)   LMP 03/18/1999   SpO2 100%   BMI 27.62 kg/m   Body mass index is 27.62 kg/m.  Advanced Directives 08/20/2017 08/19/2016 05/23/2013 04/01/2013  Does Patient Have a Medical Advance Directive? No No Patient does not have advance directive;Patient would like information Patient does not have advance directive;Patient would like information  Would patient like information on creating a medical advance directive? Yes (MAU/Ambulatory/Procedural Areas - Information given) Yes (MAU/Ambulatory/Procedural Areas - Information given) Advance directive brochure given (Outpatient ONLY) Advance directive packet given    Tobacco Social History   Tobacco Use  Smoking Status Never Smoker  Smokeless Tobacco Never Used     Counseling given: Not Answered   Clinical Intake: He uses a cane or walker.  If this can be a short distance she will just use her cane.  So has significant bilateral hip pain    Physical Exam  Constitutional: She is oriented to person, place, and time. She appears well-developed and well-nourished.  HENT:  Head: Normocephalic and atraumatic.  Right Ear: External ear normal.  Left Ear: External ear normal.  Nose: Nose normal.  Mouth/Throat: Oropharynx is clear and moist.  TMs and canals are clear.   Eyes: Pupils are equal, round, and reactive to light. Conjunctivae and EOM are normal.  Neck: Neck supple. No thyromegaly present.  Cardiovascular: Normal rate, regular rhythm and normal heart sounds.  Pulmonary/Chest: Effort normal and breath sounds normal. She has no wheezes.  Lymphadenopathy:    She has no cervical adenopathy.  Neurological: She is alert and oriented to person, place, and time.   Skin: Skin is warm and dry.  Psychiatric: She has a normal mood and affect.                      Past Medical History:  Diagnosis Date  . Antiphospholipid antibody with hypercoagulable state ([place]) 08/27/2010  . DVT (deep venous thrombosis) ([facility])    anti phospholipid antibody + -- Dr Lewanda Rife  . Hypertension   . OAB (overactive bladder)    off meds  . Post-menopausal   . Proteinuria    Dr Tressie Ellis  . Raynaud's disease    Past Surgical History:  Procedure Laterality Date  . AV FISTULA PLACEMENT  2016  . CATARACT EXTRACTION, BILATERAL  2017  . TOTAL ABDOMINAL HYSTERECTOMY  2001   for fibroids w/ 1 oophorectomy   Family History  Problem Relation Age of Onset  . Alcohol abuse Father   . Colon cancer Brother   . Prostate cancer Brother   . Cerebral palsy Brother   . Diabetes Other    Social History   Socioeconomic History  . Marital status: Divorced    Spouse name: Not on file  . Number of children: Not on file  . Years of education: Not on file  . Highest education level: Not on file  Occupational History  . Not on file  Social Needs  . Financial resource strain: Not on file  . Food insecurity:    Worry: Not on file    Inability: Not on file  . Transportation needs:  Medical: Not on file    Non-medical: Not on file  Tobacco Use  . Smoking status: Never Smoker  . Smokeless tobacco: Never Used  Substance and Sexual Activity  . Alcohol use: Yes    Comment: occasional  . Drug use: No  . Sexual activity: Not on file  Lifestyle  . Physical activity:    Days per week: Not on file    Minutes per session: Not on file  . Stress: Not on file  Relationships  . Social connections:    Talks on phone: Not on file    Gets together: Not on file    Attends religious service: Not on file    Active member of club or organization: Not on file    Attends meetings of clubs or organizations: Not on file    Relationship status: Not on file  Other Topics Concern   . Not on file  Social History Narrative  . Not on file    Outpatient Encounter Medications as of 08/20/2017  Medication Sig  . amLODipine (NORVASC) 10 MG tablet Take 5 mg by mouth daily.   Marland Kitchen atorvastatin (LIPITOR) 20 MG tablet Take 1 tablet (20 mg total) by mouth at bedtime. Due for follow up visit  . CALCIUM PO Take by mouth.  . cholecalciferol (VITAMIN D) 1000 units tablet Take 1,000 Units by mouth daily.  . cinacalcet (SENSIPAR) 30 MG tablet Take 30 mg by mouth daily.  . hydrocortisone 2.5 % cream Apply 1 application topically daily as needed.  . hydroxychloroquine (PLAQUENIL) 200 MG tablet Take 200 mg by mouth 2 (two) times daily.    . Lidocaine-Hydrocortisone Ace 2-2 % KIT Place 1 Applicatorful rectally 3 (three) times daily as needed.  Marland Kitchen losartan (COZAAR) 100 MG tablet Take 100 mg by mouth daily.  . multivitamin (RENA-VIT) TABS tablet   . warfarin (COUMADIN) 1 MG tablet   . warfarin (COUMADIN) 4 MG tablet Take by mouth.  . warfarin (COUMADIN) 5 MG tablet TAKE 1 TABLET BY MOUTH ON SUNDAY, WENESDAY, THURSDAY, FRIDAY, AND SATURDAY. THEN TAKE 1 AND 1/2 TABLET ON MONDAY AND TUESDAY  . [DISCONTINUED] hydrocortisone 2.5 % cream Apply 1 application topically daily as needed.   No facility-administered encounter medications on file as of 08/20/2017.     Activities of Daily Living In your present state of health, do you have any difficulty performing the following activities: 08/20/2017  Hearing? N  Vision? N  Difficulty concentrating or making decisions? N  Walking or climbing stairs? Y  Dressing or bathing? N  Doing errands, shopping? N  Some recent data might be hidden    Patient Care Team: Hali Marry, MD as PCP - General (Family Medicine) Aretta Nip, MD as Referring Physician (Rheumatology) Katherina Right. Nicole Kindred, MD (Nephrology) Dr. Haynes Bast (Ophthalmology)    Assessment:   This is a routine wellness examination for [place].  Exercise Activities and Dietary  recommendations Current Exercise Habits: Home exercise routine, Type of exercise: walking;strength training/weights, Intensity: Mild  Goals   None     Fall Risk Fall Risk  08/20/2017 08/19/2016 08/16/2015 04/25/2014 10/28/2012  Falls in the past year? No No No No No  Risk for fall due to : Impaired balance/gait - Impaired mobility;Impaired balance/gait - -    Depression Screen PHQ 2/9 Scores 08/20/2017 08/19/2016 08/16/2015 08/16/2015  PHQ - 2 Score 0 0 0 0     Cognitive Function     6CIT Screen 08/20/2017 08/20/2016  What Year? 0 points  0 points  What month? 0 points 0 points  What time? 0 points 0 points  Count back from 20 0 points 0 points  Months in reverse 0 points 0 points  Repeat phrase 2 points 0 points  Total Score 2 0    Immunization History  Administered Date(s) Administered  . Hepatitis B, adult 10/25/2014, 11/24/2014, 05/14/2015  . Influenza Whole 12/12/2009  . Influenza, High Dose Seasonal PF 10/06/2016  . Influenza,inj,Quad PF,6+ Mos 10/14/2012, 11/01/2013  . Influenza-Unspecified 10/25/2014  . Pneumococcal Conjugate-13 04/25/2014  . Pneumococcal Polysaccharide-23 12/12/2009, 12/20/2014  . Tdap 09/06/2004, 09/07/2014    Qualifies for Shingles Vaccine?YES  Screening Tests Health Maintenance  Topic Date Due  . INFLUENZA VACCINE  08/06/2017  . MAMMOGRAM  09/18/2017  . DEXA SCAN  09/18/2017  . COLONOSCOPY  04/15/2018  . TETANUS/TDAP  09/06/2024  . Hepatitis C Screening  Completed  . PNA vac Low Risk Adult  Completed    Cancer Screenings: Lung: Low Dose CT Chest recommended if Age 57-80 years, 30 pack-year currently smoking OR have quit w/in 15years. Patient does not qualify. Breast:  Up to date on Mammogram? Yes , did encourage her to schedule.   Up to date of Bone Density/Dexa? Yes Colorectal: she will be due next year, Brother with hx of colon cancers.   Additional Screenings: Hepatitis C Screening:      Plan:   Medicare Wellness   I have  personally reviewed and noted the following in the patient's chart:   . Medical and social history - up dated.   . Use of alcohol, tobacco or illicit drugs  . Current medications and supplements - updated.  . Functional ability and status - she is limited by her hips but she really tries to stay active . Nutritional status . Physical activity - stays active.  . Advanced directives -she has at home. She will bring up a  copy . List of other physicians . Hospitalizations, surgeries, and ER visits in previous 12 months . Vitals . Screenings to include cognitive, depression, and falls . Referrals and appointments - none  In addition, I have reviewed and discussed with patient certain preventive protocols, quality metrics, and best practice recommendations. A written personalized care plan for preventive services as well as general preventive health recommendations were provided to patient.     [name], MD  08/20/2017

## 2017-08-20 NOTE — Patient Instructions (Signed)

## 2017-08-21 ENCOUNTER — Telehealth: Payer: Self-pay | Admitting: Family Medicine

## 2017-08-21 DIAGNOSIS — N186 End stage renal disease: Secondary | ICD-10-CM | POA: Diagnosis not present

## 2017-08-21 DIAGNOSIS — N2581 Secondary hyperparathyroidism of renal origin: Secondary | ICD-10-CM | POA: Diagnosis not present

## 2017-08-21 DIAGNOSIS — D509 Iron deficiency anemia, unspecified: Secondary | ICD-10-CM | POA: Diagnosis not present

## 2017-08-21 DIAGNOSIS — D631 Anemia in chronic kidney disease: Secondary | ICD-10-CM | POA: Diagnosis not present

## 2017-08-21 NOTE — Telephone Encounter (Signed)
Please call her dialysis center and get her most up-to-date labs including the A1c so we can put the A1c and get credit for checking it.  It on her report it shows that we do not have one in the last 6 months.

## 2017-08-21 NOTE — Telephone Encounter (Signed)
Left VM for Pt to return clinic call to advise where she is going to dialysis. Once info is received, we can request labs.

## 2017-08-24 DIAGNOSIS — I82409 Acute embolism and thrombosis of unspecified deep veins of unspecified lower extremity: Secondary | ICD-10-CM | POA: Diagnosis not present

## 2017-08-24 DIAGNOSIS — D509 Iron deficiency anemia, unspecified: Secondary | ICD-10-CM | POA: Diagnosis not present

## 2017-08-24 DIAGNOSIS — N2581 Secondary hyperparathyroidism of renal origin: Secondary | ICD-10-CM | POA: Diagnosis not present

## 2017-08-24 DIAGNOSIS — D631 Anemia in chronic kidney disease: Secondary | ICD-10-CM | POA: Diagnosis not present

## 2017-08-24 DIAGNOSIS — N186 End stage renal disease: Secondary | ICD-10-CM | POA: Diagnosis not present

## 2017-08-25 NOTE — Telephone Encounter (Signed)
Patient schedule on the nurse visit for a finger stick HgbA1c.

## 2017-08-26 DIAGNOSIS — D631 Anemia in chronic kidney disease: Secondary | ICD-10-CM | POA: Diagnosis not present

## 2017-08-26 DIAGNOSIS — N2581 Secondary hyperparathyroidism of renal origin: Secondary | ICD-10-CM | POA: Diagnosis not present

## 2017-08-26 DIAGNOSIS — N186 End stage renal disease: Secondary | ICD-10-CM | POA: Diagnosis not present

## 2017-08-28 DIAGNOSIS — D631 Anemia in chronic kidney disease: Secondary | ICD-10-CM | POA: Diagnosis not present

## 2017-08-28 DIAGNOSIS — N186 End stage renal disease: Secondary | ICD-10-CM | POA: Diagnosis not present

## 2017-08-28 DIAGNOSIS — N2581 Secondary hyperparathyroidism of renal origin: Secondary | ICD-10-CM | POA: Diagnosis not present

## 2017-08-31 DIAGNOSIS — D631 Anemia in chronic kidney disease: Secondary | ICD-10-CM | POA: Diagnosis not present

## 2017-08-31 DIAGNOSIS — N186 End stage renal disease: Secondary | ICD-10-CM | POA: Diagnosis not present

## 2017-08-31 DIAGNOSIS — I82409 Acute embolism and thrombosis of unspecified deep veins of unspecified lower extremity: Secondary | ICD-10-CM | POA: Diagnosis not present

## 2017-08-31 DIAGNOSIS — N2581 Secondary hyperparathyroidism of renal origin: Secondary | ICD-10-CM | POA: Diagnosis not present

## 2017-09-01 ENCOUNTER — Ambulatory Visit (INDEPENDENT_AMBULATORY_CARE_PROVIDER_SITE_OTHER): Payer: Medicare HMO | Admitting: Family Medicine

## 2017-09-01 VITALS — BP 109/53 | HR 73

## 2017-09-01 DIAGNOSIS — R7309 Other abnormal glucose: Secondary | ICD-10-CM | POA: Diagnosis not present

## 2017-09-01 LAB — POCT GLYCOSYLATED HEMOGLOBIN (HGB A1C): Hemoglobin A1C: 5.2 % (ref 4.0–5.6)

## 2017-09-01 MED ORDER — LIDOCAINE-HYDROCORTISONE ACE 2-2 % RE KIT: 1.0000 | PACK | Freq: Three times a day (TID) | RECTAL | 11 refills | Status: DC | PRN

## 2017-09-01 NOTE — Progress Notes (Signed)
Pt came into clinic today for a1c check. Pt had labs done at her last dialysis appt and her glucose was elevated. Resulted entered, advised we would contact her with any PCP recommendations. Verbalized understanding.

## 2017-09-01 NOTE — Progress Notes (Signed)
abnormal glucose-hemoglobin A1c looks fantastic at 5.2.  No sign of prediabetes or diabetes.

## 2017-09-02 ENCOUNTER — Other Ambulatory Visit: Payer: Self-pay | Admitting: Family Medicine

## 2017-09-02 DIAGNOSIS — N186 End stage renal disease: Secondary | ICD-10-CM | POA: Diagnosis not present

## 2017-09-02 DIAGNOSIS — N2581 Secondary hyperparathyroidism of renal origin: Secondary | ICD-10-CM | POA: Diagnosis not present

## 2017-09-02 DIAGNOSIS — D631 Anemia in chronic kidney disease: Secondary | ICD-10-CM | POA: Diagnosis not present

## 2017-09-02 MED ORDER — HYDROCORTISONE 2.5 % EX CREA: 1.0000 "application " | TOPICAL_CREAM | Freq: Two times a day (BID) | CUTANEOUS | 1 refills | Status: DC | PRN

## 2017-09-03 ENCOUNTER — Encounter: Payer: Self-pay | Admitting: Family Medicine

## 2017-09-04 DIAGNOSIS — D631 Anemia in chronic kidney disease: Secondary | ICD-10-CM | POA: Diagnosis not present

## 2017-09-04 DIAGNOSIS — N2581 Secondary hyperparathyroidism of renal origin: Secondary | ICD-10-CM | POA: Diagnosis not present

## 2017-09-04 DIAGNOSIS — N186 End stage renal disease: Secondary | ICD-10-CM | POA: Diagnosis not present

## 2017-09-05 DIAGNOSIS — N186 End stage renal disease: Secondary | ICD-10-CM | POA: Diagnosis not present

## 2017-09-05 DIAGNOSIS — Z992 Dependence on renal dialysis: Secondary | ICD-10-CM | POA: Diagnosis not present

## 2017-09-07 DIAGNOSIS — N2581 Secondary hyperparathyroidism of renal origin: Secondary | ICD-10-CM | POA: Diagnosis not present

## 2017-09-07 DIAGNOSIS — N186 End stage renal disease: Secondary | ICD-10-CM | POA: Diagnosis not present

## 2017-09-07 DIAGNOSIS — D509 Iron deficiency anemia, unspecified: Secondary | ICD-10-CM | POA: Diagnosis not present

## 2017-09-07 DIAGNOSIS — I82409 Acute embolism and thrombosis of unspecified deep veins of unspecified lower extremity: Secondary | ICD-10-CM | POA: Diagnosis not present

## 2017-09-09 DIAGNOSIS — N2581 Secondary hyperparathyroidism of renal origin: Secondary | ICD-10-CM | POA: Diagnosis not present

## 2017-09-09 DIAGNOSIS — N186 End stage renal disease: Secondary | ICD-10-CM | POA: Diagnosis not present

## 2017-09-09 DIAGNOSIS — E119 Type 2 diabetes mellitus without complications: Secondary | ICD-10-CM | POA: Diagnosis not present

## 2017-09-09 DIAGNOSIS — D509 Iron deficiency anemia, unspecified: Secondary | ICD-10-CM | POA: Diagnosis not present

## 2017-09-09 LAB — BASIC METABOLIC PANEL
BUN: 61 — AB (ref 4–21)
CREATININE: 10.9 — AB (ref 0.5–1.1)
Glucose: 124
POTASSIUM: 3.8 (ref 3.4–5.3)
SODIUM: 145 (ref 137–147)

## 2017-09-09 LAB — IRON,TIBC AND FERRITIN PANEL
%SAT: 25
FERRITIN: 1.193
Iron: 50

## 2017-09-09 LAB — CBC AND DIFFERENTIAL
HCT: 30 — AB (ref 36–46)
HEMOGLOBIN: 10 — AB (ref 12.0–16.0)
Platelets: 230 (ref 150–399)
WBC: 7.5

## 2017-09-09 LAB — HEPATIC FUNCTION PANEL
ALK PHOS: 78 (ref 25–125)
ALT: 18 (ref 7–35)
AST: 15 (ref 13–35)

## 2017-09-11 DIAGNOSIS — N186 End stage renal disease: Secondary | ICD-10-CM | POA: Diagnosis not present

## 2017-09-11 DIAGNOSIS — N2581 Secondary hyperparathyroidism of renal origin: Secondary | ICD-10-CM | POA: Diagnosis not present

## 2017-09-11 DIAGNOSIS — D509 Iron deficiency anemia, unspecified: Secondary | ICD-10-CM | POA: Diagnosis not present

## 2017-09-14 DIAGNOSIS — N186 End stage renal disease: Secondary | ICD-10-CM | POA: Diagnosis not present

## 2017-09-14 DIAGNOSIS — N2581 Secondary hyperparathyroidism of renal origin: Secondary | ICD-10-CM | POA: Diagnosis not present

## 2017-09-14 DIAGNOSIS — D509 Iron deficiency anemia, unspecified: Secondary | ICD-10-CM | POA: Diagnosis not present

## 2017-09-14 DIAGNOSIS — I82409 Acute embolism and thrombosis of unspecified deep veins of unspecified lower extremity: Secondary | ICD-10-CM | POA: Diagnosis not present

## 2017-09-16 DIAGNOSIS — N2581 Secondary hyperparathyroidism of renal origin: Secondary | ICD-10-CM | POA: Diagnosis not present

## 2017-09-16 DIAGNOSIS — N186 End stage renal disease: Secondary | ICD-10-CM | POA: Diagnosis not present

## 2017-09-16 DIAGNOSIS — D631 Anemia in chronic kidney disease: Secondary | ICD-10-CM | POA: Diagnosis not present

## 2017-09-18 DIAGNOSIS — D631 Anemia in chronic kidney disease: Secondary | ICD-10-CM | POA: Diagnosis not present

## 2017-09-18 DIAGNOSIS — N186 End stage renal disease: Secondary | ICD-10-CM | POA: Diagnosis not present

## 2017-09-18 DIAGNOSIS — N2581 Secondary hyperparathyroidism of renal origin: Secondary | ICD-10-CM | POA: Diagnosis not present

## 2017-09-21 DIAGNOSIS — N2581 Secondary hyperparathyroidism of renal origin: Secondary | ICD-10-CM | POA: Diagnosis not present

## 2017-09-21 DIAGNOSIS — I82409 Acute embolism and thrombosis of unspecified deep veins of unspecified lower extremity: Secondary | ICD-10-CM | POA: Diagnosis not present

## 2017-09-21 DIAGNOSIS — N186 End stage renal disease: Secondary | ICD-10-CM | POA: Diagnosis not present

## 2017-09-21 DIAGNOSIS — D631 Anemia in chronic kidney disease: Secondary | ICD-10-CM | POA: Diagnosis not present

## 2017-09-23 DIAGNOSIS — N186 End stage renal disease: Secondary | ICD-10-CM | POA: Diagnosis not present

## 2017-09-23 DIAGNOSIS — N2581 Secondary hyperparathyroidism of renal origin: Secondary | ICD-10-CM | POA: Diagnosis not present

## 2017-09-23 DIAGNOSIS — D631 Anemia in chronic kidney disease: Secondary | ICD-10-CM | POA: Diagnosis not present

## 2017-09-24 DIAGNOSIS — I7300 Raynaud's syndrome without gangrene: Secondary | ICD-10-CM | POA: Diagnosis not present

## 2017-09-24 DIAGNOSIS — N186 End stage renal disease: Secondary | ICD-10-CM | POA: Diagnosis not present

## 2017-09-24 DIAGNOSIS — R760 Raised antibody titer: Secondary | ICD-10-CM | POA: Diagnosis not present

## 2017-09-24 DIAGNOSIS — M329 Systemic lupus erythematosus, unspecified: Secondary | ICD-10-CM | POA: Diagnosis not present

## 2017-09-25 DIAGNOSIS — D631 Anemia in chronic kidney disease: Secondary | ICD-10-CM | POA: Diagnosis not present

## 2017-09-25 DIAGNOSIS — N2581 Secondary hyperparathyroidism of renal origin: Secondary | ICD-10-CM | POA: Diagnosis not present

## 2017-09-25 DIAGNOSIS — N186 End stage renal disease: Secondary | ICD-10-CM | POA: Diagnosis not present

## 2017-09-28 DIAGNOSIS — N186 End stage renal disease: Secondary | ICD-10-CM | POA: Diagnosis not present

## 2017-09-28 DIAGNOSIS — N2581 Secondary hyperparathyroidism of renal origin: Secondary | ICD-10-CM | POA: Diagnosis not present

## 2017-09-28 DIAGNOSIS — D631 Anemia in chronic kidney disease: Secondary | ICD-10-CM | POA: Diagnosis not present

## 2017-09-28 DIAGNOSIS — I82409 Acute embolism and thrombosis of unspecified deep veins of unspecified lower extremity: Secondary | ICD-10-CM | POA: Diagnosis not present

## 2017-09-30 DIAGNOSIS — N2581 Secondary hyperparathyroidism of renal origin: Secondary | ICD-10-CM | POA: Diagnosis not present

## 2017-09-30 DIAGNOSIS — N186 End stage renal disease: Secondary | ICD-10-CM | POA: Diagnosis not present

## 2017-09-30 DIAGNOSIS — D631 Anemia in chronic kidney disease: Secondary | ICD-10-CM | POA: Diagnosis not present

## 2017-10-02 DIAGNOSIS — N186 End stage renal disease: Secondary | ICD-10-CM | POA: Diagnosis not present

## 2017-10-02 DIAGNOSIS — N2581 Secondary hyperparathyroidism of renal origin: Secondary | ICD-10-CM | POA: Diagnosis not present

## 2017-10-02 DIAGNOSIS — D631 Anemia in chronic kidney disease: Secondary | ICD-10-CM | POA: Diagnosis not present

## 2017-10-05 DIAGNOSIS — N186 End stage renal disease: Secondary | ICD-10-CM | POA: Diagnosis not present

## 2017-10-05 DIAGNOSIS — N2581 Secondary hyperparathyroidism of renal origin: Secondary | ICD-10-CM | POA: Diagnosis not present

## 2017-10-05 DIAGNOSIS — Z992 Dependence on renal dialysis: Secondary | ICD-10-CM | POA: Diagnosis not present

## 2017-10-05 DIAGNOSIS — I82409 Acute embolism and thrombosis of unspecified deep veins of unspecified lower extremity: Secondary | ICD-10-CM | POA: Diagnosis not present

## 2017-10-05 DIAGNOSIS — D631 Anemia in chronic kidney disease: Secondary | ICD-10-CM | POA: Diagnosis not present

## 2017-10-06 ENCOUNTER — Other Ambulatory Visit: Payer: Self-pay | Admitting: Family Medicine

## 2017-10-06 DIAGNOSIS — Z0000 Encounter for general adult medical examination without abnormal findings: Secondary | ICD-10-CM

## 2017-10-07 DIAGNOSIS — Z23 Encounter for immunization: Secondary | ICD-10-CM | POA: Diagnosis not present

## 2017-10-07 DIAGNOSIS — E119 Type 2 diabetes mellitus without complications: Secondary | ICD-10-CM | POA: Diagnosis not present

## 2017-10-07 DIAGNOSIS — N2581 Secondary hyperparathyroidism of renal origin: Secondary | ICD-10-CM | POA: Diagnosis not present

## 2017-10-07 DIAGNOSIS — N186 End stage renal disease: Secondary | ICD-10-CM | POA: Diagnosis not present

## 2017-10-07 DIAGNOSIS — D509 Iron deficiency anemia, unspecified: Secondary | ICD-10-CM | POA: Diagnosis not present

## 2017-10-07 DIAGNOSIS — D631 Anemia in chronic kidney disease: Secondary | ICD-10-CM | POA: Diagnosis not present

## 2017-10-09 DIAGNOSIS — N2581 Secondary hyperparathyroidism of renal origin: Secondary | ICD-10-CM | POA: Diagnosis not present

## 2017-10-09 DIAGNOSIS — D509 Iron deficiency anemia, unspecified: Secondary | ICD-10-CM | POA: Diagnosis not present

## 2017-10-09 DIAGNOSIS — D631 Anemia in chronic kidney disease: Secondary | ICD-10-CM | POA: Diagnosis not present

## 2017-10-09 DIAGNOSIS — Z23 Encounter for immunization: Secondary | ICD-10-CM | POA: Diagnosis not present

## 2017-10-09 DIAGNOSIS — N186 End stage renal disease: Secondary | ICD-10-CM | POA: Diagnosis not present

## 2017-10-12 DIAGNOSIS — D509 Iron deficiency anemia, unspecified: Secondary | ICD-10-CM | POA: Diagnosis not present

## 2017-10-12 DIAGNOSIS — N2581 Secondary hyperparathyroidism of renal origin: Secondary | ICD-10-CM | POA: Diagnosis not present

## 2017-10-12 DIAGNOSIS — Z23 Encounter for immunization: Secondary | ICD-10-CM | POA: Diagnosis not present

## 2017-10-12 DIAGNOSIS — N186 End stage renal disease: Secondary | ICD-10-CM | POA: Diagnosis not present

## 2017-10-12 DIAGNOSIS — I82409 Acute embolism and thrombosis of unspecified deep veins of unspecified lower extremity: Secondary | ICD-10-CM | POA: Diagnosis not present

## 2017-10-12 DIAGNOSIS — D631 Anemia in chronic kidney disease: Secondary | ICD-10-CM | POA: Diagnosis not present

## 2017-10-14 DIAGNOSIS — Z23 Encounter for immunization: Secondary | ICD-10-CM | POA: Diagnosis not present

## 2017-10-14 DIAGNOSIS — D631 Anemia in chronic kidney disease: Secondary | ICD-10-CM | POA: Diagnosis not present

## 2017-10-14 DIAGNOSIS — D509 Iron deficiency anemia, unspecified: Secondary | ICD-10-CM | POA: Diagnosis not present

## 2017-10-14 DIAGNOSIS — N186 End stage renal disease: Secondary | ICD-10-CM | POA: Diagnosis not present

## 2017-10-14 DIAGNOSIS — N2581 Secondary hyperparathyroidism of renal origin: Secondary | ICD-10-CM | POA: Diagnosis not present

## 2017-10-15 ENCOUNTER — Ambulatory Visit (INDEPENDENT_AMBULATORY_CARE_PROVIDER_SITE_OTHER): Payer: Medicare HMO

## 2017-10-15 DIAGNOSIS — Z1231 Encounter for screening mammogram for malignant neoplasm of breast: Secondary | ICD-10-CM | POA: Diagnosis not present

## 2017-10-15 DIAGNOSIS — Z0000 Encounter for general adult medical examination without abnormal findings: Secondary | ICD-10-CM

## 2017-10-16 ENCOUNTER — Encounter: Payer: Self-pay | Admitting: Family Medicine

## 2017-10-16 DIAGNOSIS — N186 End stage renal disease: Secondary | ICD-10-CM | POA: Diagnosis not present

## 2017-10-16 DIAGNOSIS — N2581 Secondary hyperparathyroidism of renal origin: Secondary | ICD-10-CM | POA: Diagnosis not present

## 2017-10-19 DIAGNOSIS — N2581 Secondary hyperparathyroidism of renal origin: Secondary | ICD-10-CM | POA: Diagnosis not present

## 2017-10-19 DIAGNOSIS — N186 End stage renal disease: Secondary | ICD-10-CM | POA: Diagnosis not present

## 2017-10-19 DIAGNOSIS — I82409 Acute embolism and thrombosis of unspecified deep veins of unspecified lower extremity: Secondary | ICD-10-CM | POA: Diagnosis not present

## 2017-10-21 DIAGNOSIS — N2581 Secondary hyperparathyroidism of renal origin: Secondary | ICD-10-CM | POA: Diagnosis not present

## 2017-10-21 DIAGNOSIS — N186 End stage renal disease: Secondary | ICD-10-CM | POA: Diagnosis not present

## 2017-10-23 DIAGNOSIS — N2581 Secondary hyperparathyroidism of renal origin: Secondary | ICD-10-CM | POA: Diagnosis not present

## 2017-10-23 DIAGNOSIS — N186 End stage renal disease: Secondary | ICD-10-CM | POA: Diagnosis not present

## 2017-10-26 DIAGNOSIS — N2581 Secondary hyperparathyroidism of renal origin: Secondary | ICD-10-CM | POA: Diagnosis not present

## 2017-10-26 DIAGNOSIS — D631 Anemia in chronic kidney disease: Secondary | ICD-10-CM | POA: Diagnosis not present

## 2017-10-26 DIAGNOSIS — I82409 Acute embolism and thrombosis of unspecified deep veins of unspecified lower extremity: Secondary | ICD-10-CM | POA: Diagnosis not present

## 2017-10-26 DIAGNOSIS — N186 End stage renal disease: Secondary | ICD-10-CM | POA: Diagnosis not present

## 2017-10-28 DIAGNOSIS — N186 End stage renal disease: Secondary | ICD-10-CM | POA: Diagnosis not present

## 2017-10-28 DIAGNOSIS — D631 Anemia in chronic kidney disease: Secondary | ICD-10-CM | POA: Diagnosis not present

## 2017-10-28 DIAGNOSIS — N2581 Secondary hyperparathyroidism of renal origin: Secondary | ICD-10-CM | POA: Diagnosis not present

## 2017-10-30 DIAGNOSIS — N2581 Secondary hyperparathyroidism of renal origin: Secondary | ICD-10-CM | POA: Diagnosis not present

## 2017-10-30 DIAGNOSIS — N186 End stage renal disease: Secondary | ICD-10-CM | POA: Diagnosis not present

## 2017-10-30 DIAGNOSIS — D631 Anemia in chronic kidney disease: Secondary | ICD-10-CM | POA: Diagnosis not present

## 2017-11-02 DIAGNOSIS — D631 Anemia in chronic kidney disease: Secondary | ICD-10-CM | POA: Diagnosis not present

## 2017-11-02 DIAGNOSIS — N2581 Secondary hyperparathyroidism of renal origin: Secondary | ICD-10-CM | POA: Diagnosis not present

## 2017-11-02 DIAGNOSIS — I82409 Acute embolism and thrombosis of unspecified deep veins of unspecified lower extremity: Secondary | ICD-10-CM | POA: Diagnosis not present

## 2017-11-02 DIAGNOSIS — N186 End stage renal disease: Secondary | ICD-10-CM | POA: Diagnosis not present

## 2017-11-04 DIAGNOSIS — D631 Anemia in chronic kidney disease: Secondary | ICD-10-CM | POA: Diagnosis not present

## 2017-11-04 DIAGNOSIS — N186 End stage renal disease: Secondary | ICD-10-CM | POA: Diagnosis not present

## 2017-11-04 DIAGNOSIS — N2581 Secondary hyperparathyroidism of renal origin: Secondary | ICD-10-CM | POA: Diagnosis not present

## 2017-11-05 DIAGNOSIS — N186 End stage renal disease: Secondary | ICD-10-CM | POA: Diagnosis not present

## 2017-11-05 DIAGNOSIS — Z992 Dependence on renal dialysis: Secondary | ICD-10-CM | POA: Diagnosis not present

## 2017-11-06 DIAGNOSIS — N186 End stage renal disease: Secondary | ICD-10-CM | POA: Diagnosis not present

## 2017-11-06 DIAGNOSIS — N2581 Secondary hyperparathyroidism of renal origin: Secondary | ICD-10-CM | POA: Diagnosis not present

## 2017-11-06 DIAGNOSIS — D509 Iron deficiency anemia, unspecified: Secondary | ICD-10-CM | POA: Diagnosis not present

## 2017-11-09 DIAGNOSIS — N2581 Secondary hyperparathyroidism of renal origin: Secondary | ICD-10-CM | POA: Diagnosis not present

## 2017-11-09 DIAGNOSIS — D509 Iron deficiency anemia, unspecified: Secondary | ICD-10-CM | POA: Diagnosis not present

## 2017-11-09 DIAGNOSIS — N186 End stage renal disease: Secondary | ICD-10-CM | POA: Diagnosis not present

## 2017-11-11 DIAGNOSIS — E119 Type 2 diabetes mellitus without complications: Secondary | ICD-10-CM | POA: Diagnosis not present

## 2017-11-11 DIAGNOSIS — N2581 Secondary hyperparathyroidism of renal origin: Secondary | ICD-10-CM | POA: Diagnosis not present

## 2017-11-11 DIAGNOSIS — N186 End stage renal disease: Secondary | ICD-10-CM | POA: Diagnosis not present

## 2017-11-11 DIAGNOSIS — D509 Iron deficiency anemia, unspecified: Secondary | ICD-10-CM | POA: Diagnosis not present

## 2017-11-13 DIAGNOSIS — N2581 Secondary hyperparathyroidism of renal origin: Secondary | ICD-10-CM | POA: Diagnosis not present

## 2017-11-13 DIAGNOSIS — D509 Iron deficiency anemia, unspecified: Secondary | ICD-10-CM | POA: Diagnosis not present

## 2017-11-13 DIAGNOSIS — N186 End stage renal disease: Secondary | ICD-10-CM | POA: Diagnosis not present

## 2017-11-16 DIAGNOSIS — D509 Iron deficiency anemia, unspecified: Secondary | ICD-10-CM | POA: Diagnosis not present

## 2017-11-16 DIAGNOSIS — N2581 Secondary hyperparathyroidism of renal origin: Secondary | ICD-10-CM | POA: Diagnosis not present

## 2017-11-16 DIAGNOSIS — I82409 Acute embolism and thrombosis of unspecified deep veins of unspecified lower extremity: Secondary | ICD-10-CM | POA: Diagnosis not present

## 2017-11-16 DIAGNOSIS — N186 End stage renal disease: Secondary | ICD-10-CM | POA: Diagnosis not present

## 2017-11-16 DIAGNOSIS — D631 Anemia in chronic kidney disease: Secondary | ICD-10-CM | POA: Diagnosis not present

## 2017-11-18 DIAGNOSIS — N2581 Secondary hyperparathyroidism of renal origin: Secondary | ICD-10-CM | POA: Diagnosis not present

## 2017-11-18 DIAGNOSIS — D509 Iron deficiency anemia, unspecified: Secondary | ICD-10-CM | POA: Diagnosis not present

## 2017-11-18 DIAGNOSIS — D631 Anemia in chronic kidney disease: Secondary | ICD-10-CM | POA: Diagnosis not present

## 2017-11-18 DIAGNOSIS — N186 End stage renal disease: Secondary | ICD-10-CM | POA: Diagnosis not present

## 2017-11-20 DIAGNOSIS — D509 Iron deficiency anemia, unspecified: Secondary | ICD-10-CM | POA: Diagnosis not present

## 2017-11-20 DIAGNOSIS — D631 Anemia in chronic kidney disease: Secondary | ICD-10-CM | POA: Diagnosis not present

## 2017-11-20 DIAGNOSIS — N186 End stage renal disease: Secondary | ICD-10-CM | POA: Diagnosis not present

## 2017-11-20 DIAGNOSIS — N2581 Secondary hyperparathyroidism of renal origin: Secondary | ICD-10-CM | POA: Diagnosis not present

## 2017-11-23 DIAGNOSIS — D631 Anemia in chronic kidney disease: Secondary | ICD-10-CM | POA: Diagnosis not present

## 2017-11-23 DIAGNOSIS — N2581 Secondary hyperparathyroidism of renal origin: Secondary | ICD-10-CM | POA: Diagnosis not present

## 2017-11-23 DIAGNOSIS — N186 End stage renal disease: Secondary | ICD-10-CM | POA: Diagnosis not present

## 2017-11-23 DIAGNOSIS — I82409 Acute embolism and thrombosis of unspecified deep veins of unspecified lower extremity: Secondary | ICD-10-CM | POA: Diagnosis not present

## 2017-11-23 DIAGNOSIS — D509 Iron deficiency anemia, unspecified: Secondary | ICD-10-CM | POA: Diagnosis not present

## 2017-11-25 DIAGNOSIS — D631 Anemia in chronic kidney disease: Secondary | ICD-10-CM | POA: Diagnosis not present

## 2017-11-25 DIAGNOSIS — N2581 Secondary hyperparathyroidism of renal origin: Secondary | ICD-10-CM | POA: Diagnosis not present

## 2017-11-25 DIAGNOSIS — N186 End stage renal disease: Secondary | ICD-10-CM | POA: Diagnosis not present

## 2017-11-25 DIAGNOSIS — D509 Iron deficiency anemia, unspecified: Secondary | ICD-10-CM | POA: Diagnosis not present

## 2017-11-27 DIAGNOSIS — N2581 Secondary hyperparathyroidism of renal origin: Secondary | ICD-10-CM | POA: Diagnosis not present

## 2017-11-27 DIAGNOSIS — D631 Anemia in chronic kidney disease: Secondary | ICD-10-CM | POA: Diagnosis not present

## 2017-11-27 DIAGNOSIS — N186 End stage renal disease: Secondary | ICD-10-CM | POA: Diagnosis not present

## 2017-11-29 DIAGNOSIS — N186 End stage renal disease: Secondary | ICD-10-CM | POA: Diagnosis not present

## 2017-11-29 DIAGNOSIS — D631 Anemia in chronic kidney disease: Secondary | ICD-10-CM | POA: Diagnosis not present

## 2017-11-29 DIAGNOSIS — N2581 Secondary hyperparathyroidism of renal origin: Secondary | ICD-10-CM | POA: Diagnosis not present

## 2017-11-29 DIAGNOSIS — I82409 Acute embolism and thrombosis of unspecified deep veins of unspecified lower extremity: Secondary | ICD-10-CM | POA: Diagnosis not present

## 2017-12-01 DIAGNOSIS — N186 End stage renal disease: Secondary | ICD-10-CM | POA: Diagnosis not present

## 2017-12-01 DIAGNOSIS — N2581 Secondary hyperparathyroidism of renal origin: Secondary | ICD-10-CM | POA: Diagnosis not present

## 2017-12-01 DIAGNOSIS — D631 Anemia in chronic kidney disease: Secondary | ICD-10-CM | POA: Diagnosis not present

## 2017-12-04 DIAGNOSIS — N2581 Secondary hyperparathyroidism of renal origin: Secondary | ICD-10-CM | POA: Diagnosis not present

## 2017-12-04 DIAGNOSIS — D631 Anemia in chronic kidney disease: Secondary | ICD-10-CM | POA: Diagnosis not present

## 2017-12-04 DIAGNOSIS — N186 End stage renal disease: Secondary | ICD-10-CM | POA: Diagnosis not present

## 2017-12-05 DIAGNOSIS — N186 End stage renal disease: Secondary | ICD-10-CM | POA: Diagnosis not present

## 2017-12-05 DIAGNOSIS — Z992 Dependence on renal dialysis: Secondary | ICD-10-CM | POA: Diagnosis not present

## 2017-12-07 DIAGNOSIS — I82409 Acute embolism and thrombosis of unspecified deep veins of unspecified lower extremity: Secondary | ICD-10-CM | POA: Diagnosis not present

## 2017-12-07 DIAGNOSIS — N2581 Secondary hyperparathyroidism of renal origin: Secondary | ICD-10-CM | POA: Diagnosis not present

## 2017-12-07 DIAGNOSIS — D509 Iron deficiency anemia, unspecified: Secondary | ICD-10-CM | POA: Diagnosis not present

## 2017-12-07 DIAGNOSIS — N186 End stage renal disease: Secondary | ICD-10-CM | POA: Diagnosis not present

## 2017-12-09 DIAGNOSIS — D509 Iron deficiency anemia, unspecified: Secondary | ICD-10-CM | POA: Diagnosis not present

## 2017-12-09 DIAGNOSIS — N2581 Secondary hyperparathyroidism of renal origin: Secondary | ICD-10-CM | POA: Diagnosis not present

## 2017-12-09 DIAGNOSIS — N186 End stage renal disease: Secondary | ICD-10-CM | POA: Diagnosis not present

## 2017-12-09 DIAGNOSIS — E119 Type 2 diabetes mellitus without complications: Secondary | ICD-10-CM | POA: Diagnosis not present

## 2017-12-11 DIAGNOSIS — N186 End stage renal disease: Secondary | ICD-10-CM | POA: Diagnosis not present

## 2017-12-11 DIAGNOSIS — N2581 Secondary hyperparathyroidism of renal origin: Secondary | ICD-10-CM | POA: Diagnosis not present

## 2017-12-11 DIAGNOSIS — D509 Iron deficiency anemia, unspecified: Secondary | ICD-10-CM | POA: Diagnosis not present

## 2017-12-14 DIAGNOSIS — I82409 Acute embolism and thrombosis of unspecified deep veins of unspecified lower extremity: Secondary | ICD-10-CM | POA: Diagnosis not present

## 2017-12-14 DIAGNOSIS — N186 End stage renal disease: Secondary | ICD-10-CM | POA: Diagnosis not present

## 2017-12-14 DIAGNOSIS — N2581 Secondary hyperparathyroidism of renal origin: Secondary | ICD-10-CM | POA: Diagnosis not present

## 2017-12-14 DIAGNOSIS — D509 Iron deficiency anemia, unspecified: Secondary | ICD-10-CM | POA: Diagnosis not present

## 2017-12-16 DIAGNOSIS — D631 Anemia in chronic kidney disease: Secondary | ICD-10-CM | POA: Diagnosis not present

## 2017-12-16 DIAGNOSIS — N2581 Secondary hyperparathyroidism of renal origin: Secondary | ICD-10-CM | POA: Diagnosis not present

## 2017-12-16 DIAGNOSIS — N186 End stage renal disease: Secondary | ICD-10-CM | POA: Diagnosis not present

## 2017-12-18 DIAGNOSIS — D631 Anemia in chronic kidney disease: Secondary | ICD-10-CM | POA: Diagnosis not present

## 2017-12-18 DIAGNOSIS — N2581 Secondary hyperparathyroidism of renal origin: Secondary | ICD-10-CM | POA: Diagnosis not present

## 2017-12-18 DIAGNOSIS — N186 End stage renal disease: Secondary | ICD-10-CM | POA: Diagnosis not present

## 2017-12-21 DIAGNOSIS — I82409 Acute embolism and thrombosis of unspecified deep veins of unspecified lower extremity: Secondary | ICD-10-CM | POA: Diagnosis not present

## 2017-12-21 DIAGNOSIS — N2581 Secondary hyperparathyroidism of renal origin: Secondary | ICD-10-CM | POA: Diagnosis not present

## 2017-12-21 DIAGNOSIS — D631 Anemia in chronic kidney disease: Secondary | ICD-10-CM | POA: Diagnosis not present

## 2017-12-21 DIAGNOSIS — N186 End stage renal disease: Secondary | ICD-10-CM | POA: Diagnosis not present

## 2017-12-23 DIAGNOSIS — D631 Anemia in chronic kidney disease: Secondary | ICD-10-CM | POA: Diagnosis not present

## 2017-12-23 DIAGNOSIS — N2581 Secondary hyperparathyroidism of renal origin: Secondary | ICD-10-CM | POA: Diagnosis not present

## 2017-12-23 DIAGNOSIS — N186 End stage renal disease: Secondary | ICD-10-CM | POA: Diagnosis not present

## 2017-12-25 DIAGNOSIS — N186 End stage renal disease: Secondary | ICD-10-CM | POA: Diagnosis not present

## 2017-12-25 DIAGNOSIS — D631 Anemia in chronic kidney disease: Secondary | ICD-10-CM | POA: Diagnosis not present

## 2017-12-25 DIAGNOSIS — N2581 Secondary hyperparathyroidism of renal origin: Secondary | ICD-10-CM | POA: Diagnosis not present

## 2017-12-27 DIAGNOSIS — I82409 Acute embolism and thrombosis of unspecified deep veins of unspecified lower extremity: Secondary | ICD-10-CM | POA: Diagnosis not present

## 2017-12-27 DIAGNOSIS — N186 End stage renal disease: Secondary | ICD-10-CM | POA: Diagnosis not present

## 2017-12-27 DIAGNOSIS — N2581 Secondary hyperparathyroidism of renal origin: Secondary | ICD-10-CM | POA: Diagnosis not present

## 2017-12-27 DIAGNOSIS — D631 Anemia in chronic kidney disease: Secondary | ICD-10-CM | POA: Diagnosis not present

## 2017-12-29 DIAGNOSIS — N2581 Secondary hyperparathyroidism of renal origin: Secondary | ICD-10-CM | POA: Diagnosis not present

## 2017-12-29 DIAGNOSIS — N186 End stage renal disease: Secondary | ICD-10-CM | POA: Diagnosis not present

## 2017-12-29 DIAGNOSIS — D631 Anemia in chronic kidney disease: Secondary | ICD-10-CM | POA: Diagnosis not present

## 2018-01-01 DIAGNOSIS — N186 End stage renal disease: Secondary | ICD-10-CM | POA: Diagnosis not present

## 2018-01-01 DIAGNOSIS — N2581 Secondary hyperparathyroidism of renal origin: Secondary | ICD-10-CM | POA: Diagnosis not present

## 2018-01-01 DIAGNOSIS — D631 Anemia in chronic kidney disease: Secondary | ICD-10-CM | POA: Diagnosis not present

## 2018-01-04 DIAGNOSIS — D631 Anemia in chronic kidney disease: Secondary | ICD-10-CM | POA: Diagnosis not present

## 2018-01-04 DIAGNOSIS — N186 End stage renal disease: Secondary | ICD-10-CM | POA: Diagnosis not present

## 2018-01-04 DIAGNOSIS — N2581 Secondary hyperparathyroidism of renal origin: Secondary | ICD-10-CM | POA: Diagnosis not present

## 2018-01-04 DIAGNOSIS — I82409 Acute embolism and thrombosis of unspecified deep veins of unspecified lower extremity: Secondary | ICD-10-CM | POA: Diagnosis not present

## 2018-01-05 DIAGNOSIS — N186 End stage renal disease: Secondary | ICD-10-CM | POA: Diagnosis not present

## 2018-01-05 DIAGNOSIS — Z992 Dependence on renal dialysis: Secondary | ICD-10-CM | POA: Diagnosis not present

## 2018-01-06 DIAGNOSIS — D509 Iron deficiency anemia, unspecified: Secondary | ICD-10-CM | POA: Diagnosis not present

## 2018-01-06 DIAGNOSIS — N2581 Secondary hyperparathyroidism of renal origin: Secondary | ICD-10-CM | POA: Diagnosis not present

## 2018-01-06 DIAGNOSIS — N186 End stage renal disease: Secondary | ICD-10-CM | POA: Diagnosis not present

## 2018-01-08 DIAGNOSIS — N2581 Secondary hyperparathyroidism of renal origin: Secondary | ICD-10-CM | POA: Diagnosis not present

## 2018-01-08 DIAGNOSIS — D509 Iron deficiency anemia, unspecified: Secondary | ICD-10-CM | POA: Diagnosis not present

## 2018-01-08 DIAGNOSIS — N186 End stage renal disease: Secondary | ICD-10-CM | POA: Diagnosis not present

## 2018-01-11 DIAGNOSIS — N186 End stage renal disease: Secondary | ICD-10-CM | POA: Diagnosis not present

## 2018-01-11 DIAGNOSIS — D509 Iron deficiency anemia, unspecified: Secondary | ICD-10-CM | POA: Diagnosis not present

## 2018-01-11 DIAGNOSIS — I82409 Acute embolism and thrombosis of unspecified deep veins of unspecified lower extremity: Secondary | ICD-10-CM | POA: Diagnosis not present

## 2018-01-11 DIAGNOSIS — N2581 Secondary hyperparathyroidism of renal origin: Secondary | ICD-10-CM | POA: Diagnosis not present

## 2018-01-13 DIAGNOSIS — E119 Type 2 diabetes mellitus without complications: Secondary | ICD-10-CM | POA: Diagnosis not present

## 2018-01-13 DIAGNOSIS — N186 End stage renal disease: Secondary | ICD-10-CM | POA: Diagnosis not present

## 2018-01-13 DIAGNOSIS — N2581 Secondary hyperparathyroidism of renal origin: Secondary | ICD-10-CM | POA: Diagnosis not present

## 2018-01-13 DIAGNOSIS — D509 Iron deficiency anemia, unspecified: Secondary | ICD-10-CM | POA: Diagnosis not present

## 2018-01-15 DIAGNOSIS — D509 Iron deficiency anemia, unspecified: Secondary | ICD-10-CM | POA: Diagnosis not present

## 2018-01-15 DIAGNOSIS — N186 End stage renal disease: Secondary | ICD-10-CM | POA: Diagnosis not present

## 2018-01-15 DIAGNOSIS — N2581 Secondary hyperparathyroidism of renal origin: Secondary | ICD-10-CM | POA: Diagnosis not present

## 2018-01-18 DIAGNOSIS — N186 End stage renal disease: Secondary | ICD-10-CM | POA: Diagnosis not present

## 2018-01-18 DIAGNOSIS — N2581 Secondary hyperparathyroidism of renal origin: Secondary | ICD-10-CM | POA: Diagnosis not present

## 2018-01-18 DIAGNOSIS — D631 Anemia in chronic kidney disease: Secondary | ICD-10-CM | POA: Diagnosis not present

## 2018-01-18 DIAGNOSIS — I82409 Acute embolism and thrombosis of unspecified deep veins of unspecified lower extremity: Secondary | ICD-10-CM | POA: Diagnosis not present

## 2018-01-20 DIAGNOSIS — D631 Anemia in chronic kidney disease: Secondary | ICD-10-CM | POA: Diagnosis not present

## 2018-01-20 DIAGNOSIS — N186 End stage renal disease: Secondary | ICD-10-CM | POA: Diagnosis not present

## 2018-01-20 DIAGNOSIS — N2581 Secondary hyperparathyroidism of renal origin: Secondary | ICD-10-CM | POA: Diagnosis not present

## 2018-01-22 DIAGNOSIS — N2581 Secondary hyperparathyroidism of renal origin: Secondary | ICD-10-CM | POA: Diagnosis not present

## 2018-01-22 DIAGNOSIS — D631 Anemia in chronic kidney disease: Secondary | ICD-10-CM | POA: Diagnosis not present

## 2018-01-22 DIAGNOSIS — N186 End stage renal disease: Secondary | ICD-10-CM | POA: Diagnosis not present

## 2018-01-25 DIAGNOSIS — D631 Anemia in chronic kidney disease: Secondary | ICD-10-CM | POA: Diagnosis not present

## 2018-01-25 DIAGNOSIS — N2581 Secondary hyperparathyroidism of renal origin: Secondary | ICD-10-CM | POA: Diagnosis not present

## 2018-01-25 DIAGNOSIS — I82409 Acute embolism and thrombosis of unspecified deep veins of unspecified lower extremity: Secondary | ICD-10-CM | POA: Diagnosis not present

## 2018-01-25 DIAGNOSIS — N186 End stage renal disease: Secondary | ICD-10-CM | POA: Diagnosis not present

## 2018-01-27 DIAGNOSIS — D631 Anemia in chronic kidney disease: Secondary | ICD-10-CM | POA: Diagnosis not present

## 2018-01-27 DIAGNOSIS — N186 End stage renal disease: Secondary | ICD-10-CM | POA: Diagnosis not present

## 2018-01-27 DIAGNOSIS — N2581 Secondary hyperparathyroidism of renal origin: Secondary | ICD-10-CM | POA: Diagnosis not present

## 2018-01-27 DIAGNOSIS — Z114 Encounter for screening for human immunodeficiency virus [HIV]: Secondary | ICD-10-CM | POA: Diagnosis not present

## 2018-01-27 DIAGNOSIS — Z1159 Encounter for screening for other viral diseases: Secondary | ICD-10-CM | POA: Diagnosis not present

## 2018-01-29 DIAGNOSIS — N186 End stage renal disease: Secondary | ICD-10-CM | POA: Diagnosis not present

## 2018-01-29 DIAGNOSIS — D631 Anemia in chronic kidney disease: Secondary | ICD-10-CM | POA: Diagnosis not present

## 2018-01-29 DIAGNOSIS — N2581 Secondary hyperparathyroidism of renal origin: Secondary | ICD-10-CM | POA: Diagnosis not present

## 2018-02-01 DIAGNOSIS — N186 End stage renal disease: Secondary | ICD-10-CM | POA: Diagnosis not present

## 2018-02-01 DIAGNOSIS — D631 Anemia in chronic kidney disease: Secondary | ICD-10-CM | POA: Diagnosis not present

## 2018-02-01 DIAGNOSIS — N2581 Secondary hyperparathyroidism of renal origin: Secondary | ICD-10-CM | POA: Diagnosis not present

## 2018-02-01 DIAGNOSIS — I82409 Acute embolism and thrombosis of unspecified deep veins of unspecified lower extremity: Secondary | ICD-10-CM | POA: Diagnosis not present

## 2018-02-03 DIAGNOSIS — N2581 Secondary hyperparathyroidism of renal origin: Secondary | ICD-10-CM | POA: Diagnosis not present

## 2018-02-03 DIAGNOSIS — D631 Anemia in chronic kidney disease: Secondary | ICD-10-CM | POA: Diagnosis not present

## 2018-02-03 DIAGNOSIS — N186 End stage renal disease: Secondary | ICD-10-CM | POA: Diagnosis not present

## 2018-02-05 DIAGNOSIS — N186 End stage renal disease: Secondary | ICD-10-CM | POA: Diagnosis not present

## 2018-02-05 DIAGNOSIS — D631 Anemia in chronic kidney disease: Secondary | ICD-10-CM | POA: Diagnosis not present

## 2018-02-05 DIAGNOSIS — Z992 Dependence on renal dialysis: Secondary | ICD-10-CM | POA: Diagnosis not present

## 2018-02-05 DIAGNOSIS — N2581 Secondary hyperparathyroidism of renal origin: Secondary | ICD-10-CM | POA: Diagnosis not present

## 2018-02-08 DIAGNOSIS — N186 End stage renal disease: Secondary | ICD-10-CM | POA: Diagnosis not present

## 2018-02-08 DIAGNOSIS — D509 Iron deficiency anemia, unspecified: Secondary | ICD-10-CM | POA: Diagnosis not present

## 2018-02-08 DIAGNOSIS — I82409 Acute embolism and thrombosis of unspecified deep veins of unspecified lower extremity: Secondary | ICD-10-CM | POA: Diagnosis not present

## 2018-02-08 DIAGNOSIS — N2581 Secondary hyperparathyroidism of renal origin: Secondary | ICD-10-CM | POA: Diagnosis not present

## 2018-02-10 DIAGNOSIS — N2581 Secondary hyperparathyroidism of renal origin: Secondary | ICD-10-CM | POA: Diagnosis not present

## 2018-02-10 DIAGNOSIS — D509 Iron deficiency anemia, unspecified: Secondary | ICD-10-CM | POA: Diagnosis not present

## 2018-02-10 DIAGNOSIS — N186 End stage renal disease: Secondary | ICD-10-CM | POA: Diagnosis not present

## 2018-02-10 DIAGNOSIS — E1139 Type 2 diabetes mellitus with other diabetic ophthalmic complication: Secondary | ICD-10-CM | POA: Diagnosis not present

## 2018-02-12 DIAGNOSIS — N2581 Secondary hyperparathyroidism of renal origin: Secondary | ICD-10-CM | POA: Diagnosis not present

## 2018-02-12 DIAGNOSIS — D509 Iron deficiency anemia, unspecified: Secondary | ICD-10-CM | POA: Diagnosis not present

## 2018-02-12 DIAGNOSIS — N186 End stage renal disease: Secondary | ICD-10-CM | POA: Diagnosis not present

## 2018-02-15 DIAGNOSIS — N186 End stage renal disease: Secondary | ICD-10-CM | POA: Diagnosis not present

## 2018-02-15 DIAGNOSIS — N2581 Secondary hyperparathyroidism of renal origin: Secondary | ICD-10-CM | POA: Diagnosis not present

## 2018-02-15 DIAGNOSIS — D509 Iron deficiency anemia, unspecified: Secondary | ICD-10-CM | POA: Diagnosis not present

## 2018-02-15 DIAGNOSIS — I82409 Acute embolism and thrombosis of unspecified deep veins of unspecified lower extremity: Secondary | ICD-10-CM | POA: Diagnosis not present

## 2018-02-17 DIAGNOSIS — N2581 Secondary hyperparathyroidism of renal origin: Secondary | ICD-10-CM | POA: Diagnosis not present

## 2018-02-17 DIAGNOSIS — N186 End stage renal disease: Secondary | ICD-10-CM | POA: Diagnosis not present

## 2018-02-17 DIAGNOSIS — D631 Anemia in chronic kidney disease: Secondary | ICD-10-CM | POA: Diagnosis not present

## 2018-02-19 DIAGNOSIS — N2581 Secondary hyperparathyroidism of renal origin: Secondary | ICD-10-CM | POA: Diagnosis not present

## 2018-02-19 DIAGNOSIS — N186 End stage renal disease: Secondary | ICD-10-CM | POA: Diagnosis not present

## 2018-02-19 DIAGNOSIS — D631 Anemia in chronic kidney disease: Secondary | ICD-10-CM | POA: Diagnosis not present

## 2018-02-22 DIAGNOSIS — N186 End stage renal disease: Secondary | ICD-10-CM | POA: Diagnosis not present

## 2018-02-22 DIAGNOSIS — D631 Anemia in chronic kidney disease: Secondary | ICD-10-CM | POA: Diagnosis not present

## 2018-02-22 DIAGNOSIS — I82409 Acute embolism and thrombosis of unspecified deep veins of unspecified lower extremity: Secondary | ICD-10-CM | POA: Diagnosis not present

## 2018-02-22 DIAGNOSIS — N2581 Secondary hyperparathyroidism of renal origin: Secondary | ICD-10-CM | POA: Diagnosis not present

## 2018-02-23 ENCOUNTER — Encounter: Payer: Self-pay | Admitting: Family Medicine

## 2018-02-23 ENCOUNTER — Ambulatory Visit (INDEPENDENT_AMBULATORY_CARE_PROVIDER_SITE_OTHER): Payer: Medicare HMO | Admitting: Family Medicine

## 2018-02-23 VITALS — BP 127/68 | HR 73 | Ht 62.0 in | Wt 153.0 lb

## 2018-02-23 DIAGNOSIS — E559 Vitamin D deficiency, unspecified: Secondary | ICD-10-CM

## 2018-02-23 DIAGNOSIS — M166 Other bilateral secondary osteoarthritis of hip: Secondary | ICD-10-CM

## 2018-02-23 DIAGNOSIS — Z1211 Encounter for screening for malignant neoplasm of colon: Secondary | ICD-10-CM | POA: Diagnosis not present

## 2018-02-23 DIAGNOSIS — N186 End stage renal disease: Secondary | ICD-10-CM

## 2018-02-23 DIAGNOSIS — I10 Essential (primary) hypertension: Secondary | ICD-10-CM

## 2018-02-23 DIAGNOSIS — Z992 Dependence on renal dialysis: Secondary | ICD-10-CM | POA: Diagnosis not present

## 2018-02-23 DIAGNOSIS — E782 Mixed hyperlipidemia: Secondary | ICD-10-CM | POA: Diagnosis not present

## 2018-02-23 NOTE — Progress Notes (Signed)
Subjective:    CC:   HPI:  Hypertension- Pt denies chest pain, SOB, dizziness, or heart palpitations.  Taking meds as directed w/o problems.  Denies medication side effects.  No longer on 10 mg of amlodipine I have reduced her dose down to 5 mg.she is still on losartan.    F/U Fatty liver disease - No recent changes.    ESRD on dialysis -did increase her Sensipar to 60 mg.  Hyperlipidemia - tolerating statin as well. No SE or myalgia.   Vitamin D deficiency-she is taking Vitamin D 1000 IU daily and says she gets some with her dialysis.    Avascular necrosis of the hips-she would like a consultation with 1 of the orthopedists over at [facility].  Dr. Gwyndolyn Saxon Ward.  Will place referral for consultation.  Past medical history, Surgical history, Family history not pertinant except as noted below, Social history, Allergies, and medications have been entered into the medical record, reviewed, and corrections made.   Review of Systems: No fevers, chills, night sweats, weight loss, chest pain, or shortness of breath.   Objective:    General: Well Developed, well nourished, and in no acute distress.  Neuro: Alert and oriented x3, extra-ocular muscles intact, sensation grossly intact.  HEENT: Normocephalic, atraumatic  Skin: Warm and dry, no rashes. Cardiac: Regular rate and rhythm, no murmurs rubs or gallops, no lower extremity edema.  Respiratory: Clear to auscultation bilaterally. Not using accessory muscles, speaking in full sentences.   Impression and Recommendations:    HTN - Well controlled. Continue current regimen. Follow up in  6 months. Due for screening lipids.    Faty liver - Liver enzymes have been well controlled.    ESRD on dialysis - ON M, W, F.  Stable.   Vitamin D def -levels were extremely low in the summer.  Plan to recheck that.  Hyperlipidemia-doing well on statin.  Due for repeat lipids.  Colon cancer screening-would prefer to do Cologuard instead  of having a colonoscopy.  Avascular necrosis of the hips-we will refer to Dr. Leonides Schanz for orthopedic surgery consultation.

## 2018-02-23 NOTE — Addendum Note (Signed)
Addended by: Teddy Spike on: 02/23/2018 04:59 PM   Modules accepted: Orders

## 2018-02-24 DIAGNOSIS — N2581 Secondary hyperparathyroidism of renal origin: Secondary | ICD-10-CM | POA: Diagnosis not present

## 2018-02-24 DIAGNOSIS — N186 End stage renal disease: Secondary | ICD-10-CM | POA: Diagnosis not present

## 2018-02-24 DIAGNOSIS — D631 Anemia in chronic kidney disease: Secondary | ICD-10-CM | POA: Diagnosis not present

## 2018-02-26 DIAGNOSIS — D631 Anemia in chronic kidney disease: Secondary | ICD-10-CM | POA: Diagnosis not present

## 2018-02-26 DIAGNOSIS — N186 End stage renal disease: Secondary | ICD-10-CM | POA: Diagnosis not present

## 2018-02-26 DIAGNOSIS — N2581 Secondary hyperparathyroidism of renal origin: Secondary | ICD-10-CM | POA: Diagnosis not present

## 2018-03-01 DIAGNOSIS — I82409 Acute embolism and thrombosis of unspecified deep veins of unspecified lower extremity: Secondary | ICD-10-CM | POA: Diagnosis not present

## 2018-03-01 DIAGNOSIS — N2581 Secondary hyperparathyroidism of renal origin: Secondary | ICD-10-CM | POA: Diagnosis not present

## 2018-03-01 DIAGNOSIS — N186 End stage renal disease: Secondary | ICD-10-CM | POA: Diagnosis not present

## 2018-03-01 DIAGNOSIS — D631 Anemia in chronic kidney disease: Secondary | ICD-10-CM | POA: Diagnosis not present

## 2018-03-03 DIAGNOSIS — D631 Anemia in chronic kidney disease: Secondary | ICD-10-CM | POA: Diagnosis not present

## 2018-03-03 DIAGNOSIS — N2581 Secondary hyperparathyroidism of renal origin: Secondary | ICD-10-CM | POA: Diagnosis not present

## 2018-03-03 DIAGNOSIS — N186 End stage renal disease: Secondary | ICD-10-CM | POA: Diagnosis not present

## 2018-03-05 DIAGNOSIS — N186 End stage renal disease: Secondary | ICD-10-CM | POA: Diagnosis not present

## 2018-03-05 DIAGNOSIS — D631 Anemia in chronic kidney disease: Secondary | ICD-10-CM | POA: Diagnosis not present

## 2018-03-05 DIAGNOSIS — N2581 Secondary hyperparathyroidism of renal origin: Secondary | ICD-10-CM | POA: Diagnosis not present

## 2018-03-06 DIAGNOSIS — Z992 Dependence on renal dialysis: Secondary | ICD-10-CM | POA: Diagnosis not present

## 2018-03-06 DIAGNOSIS — N186 End stage renal disease: Secondary | ICD-10-CM | POA: Diagnosis not present

## 2018-03-08 DIAGNOSIS — D509 Iron deficiency anemia, unspecified: Secondary | ICD-10-CM | POA: Diagnosis not present

## 2018-03-08 DIAGNOSIS — N186 End stage renal disease: Secondary | ICD-10-CM | POA: Diagnosis not present

## 2018-03-08 DIAGNOSIS — N2581 Secondary hyperparathyroidism of renal origin: Secondary | ICD-10-CM | POA: Diagnosis not present

## 2018-03-08 DIAGNOSIS — I82409 Acute embolism and thrombosis of unspecified deep veins of unspecified lower extremity: Secondary | ICD-10-CM | POA: Diagnosis not present

## 2018-03-10 DIAGNOSIS — N2581 Secondary hyperparathyroidism of renal origin: Secondary | ICD-10-CM | POA: Diagnosis not present

## 2018-03-10 DIAGNOSIS — N186 End stage renal disease: Secondary | ICD-10-CM | POA: Diagnosis not present

## 2018-03-10 DIAGNOSIS — E1139 Type 2 diabetes mellitus with other diabetic ophthalmic complication: Secondary | ICD-10-CM | POA: Diagnosis not present

## 2018-03-10 DIAGNOSIS — D509 Iron deficiency anemia, unspecified: Secondary | ICD-10-CM | POA: Diagnosis not present

## 2018-03-12 DIAGNOSIS — D509 Iron deficiency anemia, unspecified: Secondary | ICD-10-CM | POA: Diagnosis not present

## 2018-03-12 DIAGNOSIS — N186 End stage renal disease: Secondary | ICD-10-CM | POA: Diagnosis not present

## 2018-03-12 DIAGNOSIS — N2581 Secondary hyperparathyroidism of renal origin: Secondary | ICD-10-CM | POA: Diagnosis not present

## 2018-03-15 DIAGNOSIS — I82409 Acute embolism and thrombosis of unspecified deep veins of unspecified lower extremity: Secondary | ICD-10-CM | POA: Diagnosis not present

## 2018-03-15 DIAGNOSIS — D509 Iron deficiency anemia, unspecified: Secondary | ICD-10-CM | POA: Diagnosis not present

## 2018-03-15 DIAGNOSIS — N186 End stage renal disease: Secondary | ICD-10-CM | POA: Diagnosis not present

## 2018-03-15 DIAGNOSIS — N2581 Secondary hyperparathyroidism of renal origin: Secondary | ICD-10-CM | POA: Diagnosis not present

## 2018-03-17 DIAGNOSIS — N186 End stage renal disease: Secondary | ICD-10-CM | POA: Diagnosis not present

## 2018-03-17 DIAGNOSIS — D631 Anemia in chronic kidney disease: Secondary | ICD-10-CM | POA: Diagnosis not present

## 2018-03-17 DIAGNOSIS — N2581 Secondary hyperparathyroidism of renal origin: Secondary | ICD-10-CM | POA: Diagnosis not present

## 2018-03-19 DIAGNOSIS — N2581 Secondary hyperparathyroidism of renal origin: Secondary | ICD-10-CM | POA: Diagnosis not present

## 2018-03-19 DIAGNOSIS — N186 End stage renal disease: Secondary | ICD-10-CM | POA: Diagnosis not present

## 2018-03-19 DIAGNOSIS — D631 Anemia in chronic kidney disease: Secondary | ICD-10-CM | POA: Diagnosis not present

## 2018-03-22 DIAGNOSIS — D631 Anemia in chronic kidney disease: Secondary | ICD-10-CM | POA: Diagnosis not present

## 2018-03-22 DIAGNOSIS — M13851 Other specified arthritis, right hip: Secondary | ICD-10-CM | POA: Diagnosis not present

## 2018-03-22 DIAGNOSIS — Z992 Dependence on renal dialysis: Secondary | ICD-10-CM | POA: Diagnosis not present

## 2018-03-22 DIAGNOSIS — M25552 Pain in left hip: Secondary | ICD-10-CM | POA: Diagnosis not present

## 2018-03-22 DIAGNOSIS — I82409 Acute embolism and thrombosis of unspecified deep veins of unspecified lower extremity: Secondary | ICD-10-CM | POA: Diagnosis not present

## 2018-03-22 DIAGNOSIS — N186 End stage renal disease: Secondary | ICD-10-CM | POA: Diagnosis not present

## 2018-03-22 DIAGNOSIS — N2581 Secondary hyperparathyroidism of renal origin: Secondary | ICD-10-CM | POA: Diagnosis not present

## 2018-03-22 DIAGNOSIS — M25551 Pain in right hip: Secondary | ICD-10-CM | POA: Diagnosis not present

## 2018-03-22 DIAGNOSIS — M329 Systemic lupus erythematosus, unspecified: Secondary | ICD-10-CM | POA: Diagnosis not present

## 2018-03-22 DIAGNOSIS — M160 Bilateral primary osteoarthritis of hip: Secondary | ICD-10-CM | POA: Diagnosis not present

## 2018-03-22 DIAGNOSIS — R760 Raised antibody titer: Secondary | ICD-10-CM | POA: Diagnosis not present

## 2018-03-24 DIAGNOSIS — D631 Anemia in chronic kidney disease: Secondary | ICD-10-CM | POA: Diagnosis not present

## 2018-03-24 DIAGNOSIS — N186 End stage renal disease: Secondary | ICD-10-CM | POA: Diagnosis not present

## 2018-03-24 DIAGNOSIS — N2581 Secondary hyperparathyroidism of renal origin: Secondary | ICD-10-CM | POA: Diagnosis not present

## 2018-03-26 DIAGNOSIS — N186 End stage renal disease: Secondary | ICD-10-CM | POA: Diagnosis not present

## 2018-03-26 DIAGNOSIS — D631 Anemia in chronic kidney disease: Secondary | ICD-10-CM | POA: Diagnosis not present

## 2018-03-26 DIAGNOSIS — N2581 Secondary hyperparathyroidism of renal origin: Secondary | ICD-10-CM | POA: Diagnosis not present

## 2018-03-29 DIAGNOSIS — N2581 Secondary hyperparathyroidism of renal origin: Secondary | ICD-10-CM | POA: Diagnosis not present

## 2018-03-29 DIAGNOSIS — I82409 Acute embolism and thrombosis of unspecified deep veins of unspecified lower extremity: Secondary | ICD-10-CM | POA: Diagnosis not present

## 2018-03-29 DIAGNOSIS — N186 End stage renal disease: Secondary | ICD-10-CM | POA: Diagnosis not present

## 2018-03-29 DIAGNOSIS — D631 Anemia in chronic kidney disease: Secondary | ICD-10-CM | POA: Diagnosis not present

## 2018-03-31 DIAGNOSIS — N186 End stage renal disease: Secondary | ICD-10-CM | POA: Diagnosis not present

## 2018-03-31 DIAGNOSIS — N2581 Secondary hyperparathyroidism of renal origin: Secondary | ICD-10-CM | POA: Diagnosis not present

## 2018-03-31 DIAGNOSIS — D631 Anemia in chronic kidney disease: Secondary | ICD-10-CM | POA: Diagnosis not present

## 2018-04-02 DIAGNOSIS — N2581 Secondary hyperparathyroidism of renal origin: Secondary | ICD-10-CM | POA: Diagnosis not present

## 2018-04-02 DIAGNOSIS — N186 End stage renal disease: Secondary | ICD-10-CM | POA: Diagnosis not present

## 2018-04-02 DIAGNOSIS — D631 Anemia in chronic kidney disease: Secondary | ICD-10-CM | POA: Diagnosis not present

## 2018-04-05 DIAGNOSIS — D631 Anemia in chronic kidney disease: Secondary | ICD-10-CM | POA: Diagnosis not present

## 2018-04-05 DIAGNOSIS — N186 End stage renal disease: Secondary | ICD-10-CM | POA: Diagnosis not present

## 2018-04-05 DIAGNOSIS — I82409 Acute embolism and thrombosis of unspecified deep veins of unspecified lower extremity: Secondary | ICD-10-CM | POA: Diagnosis not present

## 2018-04-05 DIAGNOSIS — N2581 Secondary hyperparathyroidism of renal origin: Secondary | ICD-10-CM | POA: Diagnosis not present

## 2018-04-06 DIAGNOSIS — Z992 Dependence on renal dialysis: Secondary | ICD-10-CM | POA: Diagnosis not present

## 2018-04-06 DIAGNOSIS — N186 End stage renal disease: Secondary | ICD-10-CM | POA: Diagnosis not present

## 2018-04-07 DIAGNOSIS — N186 End stage renal disease: Secondary | ICD-10-CM | POA: Diagnosis not present

## 2018-04-07 DIAGNOSIS — N2581 Secondary hyperparathyroidism of renal origin: Secondary | ICD-10-CM | POA: Diagnosis not present

## 2018-04-07 DIAGNOSIS — D509 Iron deficiency anemia, unspecified: Secondary | ICD-10-CM | POA: Diagnosis not present

## 2018-04-07 DIAGNOSIS — D631 Anemia in chronic kidney disease: Secondary | ICD-10-CM | POA: Diagnosis not present

## 2018-04-07 DIAGNOSIS — E1139 Type 2 diabetes mellitus with other diabetic ophthalmic complication: Secondary | ICD-10-CM | POA: Diagnosis not present

## 2018-04-09 DIAGNOSIS — D509 Iron deficiency anemia, unspecified: Secondary | ICD-10-CM | POA: Diagnosis not present

## 2018-04-09 DIAGNOSIS — N2581 Secondary hyperparathyroidism of renal origin: Secondary | ICD-10-CM | POA: Diagnosis not present

## 2018-04-09 DIAGNOSIS — D631 Anemia in chronic kidney disease: Secondary | ICD-10-CM | POA: Diagnosis not present

## 2018-04-09 DIAGNOSIS — N186 End stage renal disease: Secondary | ICD-10-CM | POA: Diagnosis not present

## 2018-04-12 DIAGNOSIS — N186 End stage renal disease: Secondary | ICD-10-CM | POA: Diagnosis not present

## 2018-04-12 DIAGNOSIS — I82409 Acute embolism and thrombosis of unspecified deep veins of unspecified lower extremity: Secondary | ICD-10-CM | POA: Diagnosis not present

## 2018-04-12 DIAGNOSIS — N2581 Secondary hyperparathyroidism of renal origin: Secondary | ICD-10-CM | POA: Diagnosis not present

## 2018-04-12 DIAGNOSIS — D631 Anemia in chronic kidney disease: Secondary | ICD-10-CM | POA: Diagnosis not present

## 2018-04-12 DIAGNOSIS — D509 Iron deficiency anemia, unspecified: Secondary | ICD-10-CM | POA: Diagnosis not present

## 2018-04-14 DIAGNOSIS — N2581 Secondary hyperparathyroidism of renal origin: Secondary | ICD-10-CM | POA: Diagnosis not present

## 2018-04-14 DIAGNOSIS — N186 End stage renal disease: Secondary | ICD-10-CM | POA: Diagnosis not present

## 2018-04-14 DIAGNOSIS — D631 Anemia in chronic kidney disease: Secondary | ICD-10-CM | POA: Diagnosis not present

## 2018-04-14 DIAGNOSIS — D509 Iron deficiency anemia, unspecified: Secondary | ICD-10-CM | POA: Diagnosis not present

## 2018-04-16 DIAGNOSIS — D509 Iron deficiency anemia, unspecified: Secondary | ICD-10-CM | POA: Diagnosis not present

## 2018-04-16 DIAGNOSIS — D631 Anemia in chronic kidney disease: Secondary | ICD-10-CM | POA: Diagnosis not present

## 2018-04-16 DIAGNOSIS — N2581 Secondary hyperparathyroidism of renal origin: Secondary | ICD-10-CM | POA: Diagnosis not present

## 2018-04-16 DIAGNOSIS — N186 End stage renal disease: Secondary | ICD-10-CM | POA: Diagnosis not present

## 2018-04-19 DIAGNOSIS — I82409 Acute embolism and thrombosis of unspecified deep veins of unspecified lower extremity: Secondary | ICD-10-CM | POA: Diagnosis not present

## 2018-04-19 DIAGNOSIS — N186 End stage renal disease: Secondary | ICD-10-CM | POA: Diagnosis not present

## 2018-04-19 DIAGNOSIS — N2581 Secondary hyperparathyroidism of renal origin: Secondary | ICD-10-CM | POA: Diagnosis not present

## 2018-04-21 DIAGNOSIS — N2581 Secondary hyperparathyroidism of renal origin: Secondary | ICD-10-CM | POA: Diagnosis not present

## 2018-04-21 DIAGNOSIS — N186 End stage renal disease: Secondary | ICD-10-CM | POA: Diagnosis not present

## 2018-04-23 DIAGNOSIS — N186 End stage renal disease: Secondary | ICD-10-CM | POA: Diagnosis not present

## 2018-04-23 DIAGNOSIS — N2581 Secondary hyperparathyroidism of renal origin: Secondary | ICD-10-CM | POA: Diagnosis not present

## 2018-04-26 DIAGNOSIS — N2581 Secondary hyperparathyroidism of renal origin: Secondary | ICD-10-CM | POA: Diagnosis not present

## 2018-04-26 DIAGNOSIS — I82409 Acute embolism and thrombosis of unspecified deep veins of unspecified lower extremity: Secondary | ICD-10-CM | POA: Diagnosis not present

## 2018-04-26 DIAGNOSIS — N186 End stage renal disease: Secondary | ICD-10-CM | POA: Diagnosis not present

## 2018-04-28 DIAGNOSIS — N186 End stage renal disease: Secondary | ICD-10-CM | POA: Diagnosis not present

## 2018-04-28 DIAGNOSIS — N2581 Secondary hyperparathyroidism of renal origin: Secondary | ICD-10-CM | POA: Diagnosis not present

## 2018-04-28 DIAGNOSIS — D631 Anemia in chronic kidney disease: Secondary | ICD-10-CM | POA: Diagnosis not present

## 2018-04-30 DIAGNOSIS — N2581 Secondary hyperparathyroidism of renal origin: Secondary | ICD-10-CM | POA: Diagnosis not present

## 2018-04-30 DIAGNOSIS — D631 Anemia in chronic kidney disease: Secondary | ICD-10-CM | POA: Diagnosis not present

## 2018-04-30 DIAGNOSIS — N186 End stage renal disease: Secondary | ICD-10-CM | POA: Diagnosis not present

## 2018-05-03 DIAGNOSIS — N186 End stage renal disease: Secondary | ICD-10-CM | POA: Diagnosis not present

## 2018-05-03 DIAGNOSIS — I82409 Acute embolism and thrombosis of unspecified deep veins of unspecified lower extremity: Secondary | ICD-10-CM | POA: Diagnosis not present

## 2018-05-03 DIAGNOSIS — D631 Anemia in chronic kidney disease: Secondary | ICD-10-CM | POA: Diagnosis not present

## 2018-05-03 DIAGNOSIS — N2581 Secondary hyperparathyroidism of renal origin: Secondary | ICD-10-CM | POA: Diagnosis not present

## 2018-05-05 DIAGNOSIS — D631 Anemia in chronic kidney disease: Secondary | ICD-10-CM | POA: Diagnosis not present

## 2018-05-05 DIAGNOSIS — N2581 Secondary hyperparathyroidism of renal origin: Secondary | ICD-10-CM | POA: Diagnosis not present

## 2018-05-05 DIAGNOSIS — N186 End stage renal disease: Secondary | ICD-10-CM | POA: Diagnosis not present

## 2018-05-06 DIAGNOSIS — N186 End stage renal disease: Secondary | ICD-10-CM | POA: Diagnosis not present

## 2018-05-06 DIAGNOSIS — Z992 Dependence on renal dialysis: Secondary | ICD-10-CM | POA: Diagnosis not present

## 2018-05-07 DIAGNOSIS — D509 Iron deficiency anemia, unspecified: Secondary | ICD-10-CM | POA: Diagnosis not present

## 2018-05-07 DIAGNOSIS — N186 End stage renal disease: Secondary | ICD-10-CM | POA: Diagnosis not present

## 2018-05-07 DIAGNOSIS — N2581 Secondary hyperparathyroidism of renal origin: Secondary | ICD-10-CM | POA: Diagnosis not present

## 2018-05-10 DIAGNOSIS — N186 End stage renal disease: Secondary | ICD-10-CM | POA: Diagnosis not present

## 2018-05-10 DIAGNOSIS — N2581 Secondary hyperparathyroidism of renal origin: Secondary | ICD-10-CM | POA: Diagnosis not present

## 2018-05-10 DIAGNOSIS — D509 Iron deficiency anemia, unspecified: Secondary | ICD-10-CM | POA: Diagnosis not present

## 2018-05-10 DIAGNOSIS — I82409 Acute embolism and thrombosis of unspecified deep veins of unspecified lower extremity: Secondary | ICD-10-CM | POA: Diagnosis not present

## 2018-05-12 DIAGNOSIS — E1139 Type 2 diabetes mellitus with other diabetic ophthalmic complication: Secondary | ICD-10-CM | POA: Diagnosis not present

## 2018-05-12 DIAGNOSIS — N186 End stage renal disease: Secondary | ICD-10-CM | POA: Diagnosis not present

## 2018-05-12 DIAGNOSIS — D509 Iron deficiency anemia, unspecified: Secondary | ICD-10-CM | POA: Diagnosis not present

## 2018-05-12 DIAGNOSIS — N2581 Secondary hyperparathyroidism of renal origin: Secondary | ICD-10-CM | POA: Diagnosis not present

## 2018-05-14 DIAGNOSIS — N186 End stage renal disease: Secondary | ICD-10-CM | POA: Diagnosis not present

## 2018-05-14 DIAGNOSIS — N2581 Secondary hyperparathyroidism of renal origin: Secondary | ICD-10-CM | POA: Diagnosis not present

## 2018-05-14 DIAGNOSIS — D509 Iron deficiency anemia, unspecified: Secondary | ICD-10-CM | POA: Diagnosis not present

## 2018-05-17 DIAGNOSIS — D631 Anemia in chronic kidney disease: Secondary | ICD-10-CM | POA: Diagnosis not present

## 2018-05-17 DIAGNOSIS — I82409 Acute embolism and thrombosis of unspecified deep veins of unspecified lower extremity: Secondary | ICD-10-CM | POA: Diagnosis not present

## 2018-05-17 DIAGNOSIS — N186 End stage renal disease: Secondary | ICD-10-CM | POA: Diagnosis not present

## 2018-05-17 DIAGNOSIS — D509 Iron deficiency anemia, unspecified: Secondary | ICD-10-CM | POA: Diagnosis not present

## 2018-05-17 DIAGNOSIS — N2581 Secondary hyperparathyroidism of renal origin: Secondary | ICD-10-CM | POA: Diagnosis not present

## 2018-05-19 DIAGNOSIS — D509 Iron deficiency anemia, unspecified: Secondary | ICD-10-CM | POA: Diagnosis not present

## 2018-05-19 DIAGNOSIS — N186 End stage renal disease: Secondary | ICD-10-CM | POA: Diagnosis not present

## 2018-05-19 DIAGNOSIS — N2581 Secondary hyperparathyroidism of renal origin: Secondary | ICD-10-CM | POA: Diagnosis not present

## 2018-05-19 DIAGNOSIS — D631 Anemia in chronic kidney disease: Secondary | ICD-10-CM | POA: Diagnosis not present

## 2018-05-21 DIAGNOSIS — D509 Iron deficiency anemia, unspecified: Secondary | ICD-10-CM | POA: Diagnosis not present

## 2018-05-21 DIAGNOSIS — N186 End stage renal disease: Secondary | ICD-10-CM | POA: Diagnosis not present

## 2018-05-21 DIAGNOSIS — D631 Anemia in chronic kidney disease: Secondary | ICD-10-CM | POA: Diagnosis not present

## 2018-05-21 DIAGNOSIS — N2581 Secondary hyperparathyroidism of renal origin: Secondary | ICD-10-CM | POA: Diagnosis not present

## 2018-05-24 DIAGNOSIS — D509 Iron deficiency anemia, unspecified: Secondary | ICD-10-CM | POA: Diagnosis not present

## 2018-05-24 DIAGNOSIS — I82409 Acute embolism and thrombosis of unspecified deep veins of unspecified lower extremity: Secondary | ICD-10-CM | POA: Diagnosis not present

## 2018-05-24 DIAGNOSIS — D631 Anemia in chronic kidney disease: Secondary | ICD-10-CM | POA: Diagnosis not present

## 2018-05-24 DIAGNOSIS — N2581 Secondary hyperparathyroidism of renal origin: Secondary | ICD-10-CM | POA: Diagnosis not present

## 2018-05-24 DIAGNOSIS — N186 End stage renal disease: Secondary | ICD-10-CM | POA: Diagnosis not present

## 2018-05-26 DIAGNOSIS — D631 Anemia in chronic kidney disease: Secondary | ICD-10-CM | POA: Diagnosis not present

## 2018-05-26 DIAGNOSIS — N2581 Secondary hyperparathyroidism of renal origin: Secondary | ICD-10-CM | POA: Diagnosis not present

## 2018-05-26 DIAGNOSIS — N186 End stage renal disease: Secondary | ICD-10-CM | POA: Diagnosis not present

## 2018-05-26 DIAGNOSIS — D509 Iron deficiency anemia, unspecified: Secondary | ICD-10-CM | POA: Diagnosis not present

## 2018-05-28 DIAGNOSIS — N186 End stage renal disease: Secondary | ICD-10-CM | POA: Diagnosis not present

## 2018-05-28 DIAGNOSIS — N2581 Secondary hyperparathyroidism of renal origin: Secondary | ICD-10-CM | POA: Diagnosis not present

## 2018-05-31 DIAGNOSIS — N186 End stage renal disease: Secondary | ICD-10-CM | POA: Diagnosis not present

## 2018-05-31 DIAGNOSIS — N2581 Secondary hyperparathyroidism of renal origin: Secondary | ICD-10-CM | POA: Diagnosis not present

## 2018-05-31 DIAGNOSIS — I82409 Acute embolism and thrombosis of unspecified deep veins of unspecified lower extremity: Secondary | ICD-10-CM | POA: Diagnosis not present

## 2018-06-02 DIAGNOSIS — N2581 Secondary hyperparathyroidism of renal origin: Secondary | ICD-10-CM | POA: Diagnosis not present

## 2018-06-02 DIAGNOSIS — N186 End stage renal disease: Secondary | ICD-10-CM | POA: Diagnosis not present

## 2018-06-04 DIAGNOSIS — N2581 Secondary hyperparathyroidism of renal origin: Secondary | ICD-10-CM | POA: Diagnosis not present

## 2018-06-04 DIAGNOSIS — N186 End stage renal disease: Secondary | ICD-10-CM | POA: Diagnosis not present

## 2018-06-06 DIAGNOSIS — Z992 Dependence on renal dialysis: Secondary | ICD-10-CM | POA: Diagnosis not present

## 2018-06-06 DIAGNOSIS — N186 End stage renal disease: Secondary | ICD-10-CM | POA: Diagnosis not present

## 2018-06-07 DIAGNOSIS — D631 Anemia in chronic kidney disease: Secondary | ICD-10-CM | POA: Diagnosis not present

## 2018-06-07 DIAGNOSIS — D509 Iron deficiency anemia, unspecified: Secondary | ICD-10-CM | POA: Diagnosis not present

## 2018-06-07 DIAGNOSIS — I82409 Acute embolism and thrombosis of unspecified deep veins of unspecified lower extremity: Secondary | ICD-10-CM | POA: Diagnosis not present

## 2018-06-07 DIAGNOSIS — N2581 Secondary hyperparathyroidism of renal origin: Secondary | ICD-10-CM | POA: Diagnosis not present

## 2018-06-07 DIAGNOSIS — N186 End stage renal disease: Secondary | ICD-10-CM | POA: Diagnosis not present

## 2018-06-09 DIAGNOSIS — E1139 Type 2 diabetes mellitus with other diabetic ophthalmic complication: Secondary | ICD-10-CM | POA: Diagnosis not present

## 2018-06-09 DIAGNOSIS — N186 End stage renal disease: Secondary | ICD-10-CM | POA: Diagnosis not present

## 2018-06-09 DIAGNOSIS — N2581 Secondary hyperparathyroidism of renal origin: Secondary | ICD-10-CM | POA: Diagnosis not present

## 2018-06-09 DIAGNOSIS — D631 Anemia in chronic kidney disease: Secondary | ICD-10-CM | POA: Diagnosis not present

## 2018-06-09 DIAGNOSIS — D509 Iron deficiency anemia, unspecified: Secondary | ICD-10-CM | POA: Diagnosis not present

## 2018-06-10 DIAGNOSIS — Z86718 Personal history of other venous thrombosis and embolism: Secondary | ICD-10-CM | POA: Diagnosis not present

## 2018-06-10 DIAGNOSIS — Z7901 Long term (current) use of anticoagulants: Secondary | ICD-10-CM | POA: Diagnosis not present

## 2018-06-10 DIAGNOSIS — N186 End stage renal disease: Secondary | ICD-10-CM | POA: Diagnosis not present

## 2018-06-10 DIAGNOSIS — R760 Raised antibody titer: Secondary | ICD-10-CM | POA: Diagnosis not present

## 2018-06-11 DIAGNOSIS — D631 Anemia in chronic kidney disease: Secondary | ICD-10-CM | POA: Diagnosis not present

## 2018-06-11 DIAGNOSIS — N186 End stage renal disease: Secondary | ICD-10-CM | POA: Diagnosis not present

## 2018-06-11 DIAGNOSIS — N2581 Secondary hyperparathyroidism of renal origin: Secondary | ICD-10-CM | POA: Diagnosis not present

## 2018-06-11 DIAGNOSIS — D509 Iron deficiency anemia, unspecified: Secondary | ICD-10-CM | POA: Diagnosis not present

## 2018-06-14 DIAGNOSIS — D631 Anemia in chronic kidney disease: Secondary | ICD-10-CM | POA: Diagnosis not present

## 2018-06-14 DIAGNOSIS — N186 End stage renal disease: Secondary | ICD-10-CM | POA: Diagnosis not present

## 2018-06-14 DIAGNOSIS — D509 Iron deficiency anemia, unspecified: Secondary | ICD-10-CM | POA: Diagnosis not present

## 2018-06-14 DIAGNOSIS — N2581 Secondary hyperparathyroidism of renal origin: Secondary | ICD-10-CM | POA: Diagnosis not present

## 2018-06-14 DIAGNOSIS — I82409 Acute embolism and thrombosis of unspecified deep veins of unspecified lower extremity: Secondary | ICD-10-CM | POA: Diagnosis not present

## 2018-06-16 DIAGNOSIS — N186 End stage renal disease: Secondary | ICD-10-CM | POA: Diagnosis not present

## 2018-06-16 DIAGNOSIS — N2581 Secondary hyperparathyroidism of renal origin: Secondary | ICD-10-CM | POA: Diagnosis not present

## 2018-06-16 DIAGNOSIS — D631 Anemia in chronic kidney disease: Secondary | ICD-10-CM | POA: Diagnosis not present

## 2018-06-16 DIAGNOSIS — D509 Iron deficiency anemia, unspecified: Secondary | ICD-10-CM | POA: Diagnosis not present

## 2018-06-18 DIAGNOSIS — N186 End stage renal disease: Secondary | ICD-10-CM | POA: Diagnosis not present

## 2018-06-18 DIAGNOSIS — N2581 Secondary hyperparathyroidism of renal origin: Secondary | ICD-10-CM | POA: Diagnosis not present

## 2018-06-21 DIAGNOSIS — N2581 Secondary hyperparathyroidism of renal origin: Secondary | ICD-10-CM | POA: Diagnosis not present

## 2018-06-21 DIAGNOSIS — I82409 Acute embolism and thrombosis of unspecified deep veins of unspecified lower extremity: Secondary | ICD-10-CM | POA: Diagnosis not present

## 2018-06-21 DIAGNOSIS — N186 End stage renal disease: Secondary | ICD-10-CM | POA: Diagnosis not present

## 2018-06-23 DIAGNOSIS — N2581 Secondary hyperparathyroidism of renal origin: Secondary | ICD-10-CM | POA: Diagnosis not present

## 2018-06-23 DIAGNOSIS — N186 End stage renal disease: Secondary | ICD-10-CM | POA: Diagnosis not present

## 2018-06-25 DIAGNOSIS — N186 End stage renal disease: Secondary | ICD-10-CM | POA: Diagnosis not present

## 2018-06-25 DIAGNOSIS — N2581 Secondary hyperparathyroidism of renal origin: Secondary | ICD-10-CM | POA: Diagnosis not present

## 2018-06-28 DIAGNOSIS — D631 Anemia in chronic kidney disease: Secondary | ICD-10-CM | POA: Diagnosis not present

## 2018-06-28 DIAGNOSIS — N2581 Secondary hyperparathyroidism of renal origin: Secondary | ICD-10-CM | POA: Diagnosis not present

## 2018-06-28 DIAGNOSIS — I82409 Acute embolism and thrombosis of unspecified deep veins of unspecified lower extremity: Secondary | ICD-10-CM | POA: Diagnosis not present

## 2018-06-28 DIAGNOSIS — N186 End stage renal disease: Secondary | ICD-10-CM | POA: Diagnosis not present

## 2018-06-30 DIAGNOSIS — N2581 Secondary hyperparathyroidism of renal origin: Secondary | ICD-10-CM | POA: Diagnosis not present

## 2018-06-30 DIAGNOSIS — N186 End stage renal disease: Secondary | ICD-10-CM | POA: Diagnosis not present

## 2018-06-30 DIAGNOSIS — D631 Anemia in chronic kidney disease: Secondary | ICD-10-CM | POA: Diagnosis not present

## 2018-07-02 DIAGNOSIS — N2581 Secondary hyperparathyroidism of renal origin: Secondary | ICD-10-CM | POA: Diagnosis not present

## 2018-07-02 DIAGNOSIS — N186 End stage renal disease: Secondary | ICD-10-CM | POA: Diagnosis not present

## 2018-07-02 DIAGNOSIS — D631 Anemia in chronic kidney disease: Secondary | ICD-10-CM | POA: Diagnosis not present

## 2018-07-05 DIAGNOSIS — D631 Anemia in chronic kidney disease: Secondary | ICD-10-CM | POA: Diagnosis not present

## 2018-07-05 DIAGNOSIS — N2581 Secondary hyperparathyroidism of renal origin: Secondary | ICD-10-CM | POA: Diagnosis not present

## 2018-07-05 DIAGNOSIS — N186 End stage renal disease: Secondary | ICD-10-CM | POA: Diagnosis not present

## 2018-07-05 DIAGNOSIS — I82409 Acute embolism and thrombosis of unspecified deep veins of unspecified lower extremity: Secondary | ICD-10-CM | POA: Diagnosis not present

## 2018-07-06 DIAGNOSIS — Z992 Dependence on renal dialysis: Secondary | ICD-10-CM | POA: Diagnosis not present

## 2018-07-06 DIAGNOSIS — N186 End stage renal disease: Secondary | ICD-10-CM | POA: Diagnosis not present

## 2018-07-07 DIAGNOSIS — D509 Iron deficiency anemia, unspecified: Secondary | ICD-10-CM | POA: Diagnosis not present

## 2018-07-07 DIAGNOSIS — N2581 Secondary hyperparathyroidism of renal origin: Secondary | ICD-10-CM | POA: Diagnosis not present

## 2018-07-07 DIAGNOSIS — N186 End stage renal disease: Secondary | ICD-10-CM | POA: Diagnosis not present

## 2018-07-09 DIAGNOSIS — N2581 Secondary hyperparathyroidism of renal origin: Secondary | ICD-10-CM | POA: Diagnosis not present

## 2018-07-09 DIAGNOSIS — D509 Iron deficiency anemia, unspecified: Secondary | ICD-10-CM | POA: Diagnosis not present

## 2018-07-09 DIAGNOSIS — N186 End stage renal disease: Secondary | ICD-10-CM | POA: Diagnosis not present

## 2018-07-12 DIAGNOSIS — I82409 Acute embolism and thrombosis of unspecified deep veins of unspecified lower extremity: Secondary | ICD-10-CM | POA: Diagnosis not present

## 2018-07-12 DIAGNOSIS — N2581 Secondary hyperparathyroidism of renal origin: Secondary | ICD-10-CM | POA: Diagnosis not present

## 2018-07-12 DIAGNOSIS — N186 End stage renal disease: Secondary | ICD-10-CM | POA: Diagnosis not present

## 2018-07-12 DIAGNOSIS — D509 Iron deficiency anemia, unspecified: Secondary | ICD-10-CM | POA: Diagnosis not present

## 2018-07-14 DIAGNOSIS — N2581 Secondary hyperparathyroidism of renal origin: Secondary | ICD-10-CM | POA: Diagnosis not present

## 2018-07-14 DIAGNOSIS — N186 End stage renal disease: Secondary | ICD-10-CM | POA: Diagnosis not present

## 2018-07-14 DIAGNOSIS — D509 Iron deficiency anemia, unspecified: Secondary | ICD-10-CM | POA: Diagnosis not present

## 2018-07-14 DIAGNOSIS — E1139 Type 2 diabetes mellitus with other diabetic ophthalmic complication: Secondary | ICD-10-CM | POA: Diagnosis not present

## 2018-07-16 DIAGNOSIS — N2581 Secondary hyperparathyroidism of renal origin: Secondary | ICD-10-CM | POA: Diagnosis not present

## 2018-07-16 DIAGNOSIS — D509 Iron deficiency anemia, unspecified: Secondary | ICD-10-CM | POA: Diagnosis not present

## 2018-07-16 DIAGNOSIS — N186 End stage renal disease: Secondary | ICD-10-CM | POA: Diagnosis not present

## 2018-07-19 DIAGNOSIS — I82409 Acute embolism and thrombosis of unspecified deep veins of unspecified lower extremity: Secondary | ICD-10-CM | POA: Diagnosis not present

## 2018-07-19 DIAGNOSIS — N186 End stage renal disease: Secondary | ICD-10-CM | POA: Diagnosis not present

## 2018-07-19 DIAGNOSIS — N2581 Secondary hyperparathyroidism of renal origin: Secondary | ICD-10-CM | POA: Diagnosis not present

## 2018-07-19 DIAGNOSIS — D631 Anemia in chronic kidney disease: Secondary | ICD-10-CM | POA: Diagnosis not present

## 2018-07-21 DIAGNOSIS — N2581 Secondary hyperparathyroidism of renal origin: Secondary | ICD-10-CM | POA: Diagnosis not present

## 2018-07-21 DIAGNOSIS — N186 End stage renal disease: Secondary | ICD-10-CM | POA: Diagnosis not present

## 2018-07-21 DIAGNOSIS — D631 Anemia in chronic kidney disease: Secondary | ICD-10-CM | POA: Diagnosis not present

## 2018-07-23 DIAGNOSIS — D631 Anemia in chronic kidney disease: Secondary | ICD-10-CM | POA: Diagnosis not present

## 2018-07-23 DIAGNOSIS — N186 End stage renal disease: Secondary | ICD-10-CM | POA: Diagnosis not present

## 2018-07-23 DIAGNOSIS — N2581 Secondary hyperparathyroidism of renal origin: Secondary | ICD-10-CM | POA: Diagnosis not present

## 2018-07-26 DIAGNOSIS — M329 Systemic lupus erythematosus, unspecified: Secondary | ICD-10-CM | POA: Diagnosis not present

## 2018-07-26 DIAGNOSIS — M160 Bilateral primary osteoarthritis of hip: Secondary | ICD-10-CM | POA: Diagnosis not present

## 2018-07-26 DIAGNOSIS — N186 End stage renal disease: Secondary | ICD-10-CM | POA: Diagnosis not present

## 2018-07-26 DIAGNOSIS — Z992 Dependence on renal dialysis: Secondary | ICD-10-CM | POA: Diagnosis not present

## 2018-07-26 DIAGNOSIS — D631 Anemia in chronic kidney disease: Secondary | ICD-10-CM | POA: Diagnosis not present

## 2018-07-26 DIAGNOSIS — M87051 Idiopathic aseptic necrosis of right femur: Secondary | ICD-10-CM | POA: Diagnosis not present

## 2018-07-26 DIAGNOSIS — M25551 Pain in right hip: Secondary | ICD-10-CM | POA: Diagnosis not present

## 2018-07-26 DIAGNOSIS — N2581 Secondary hyperparathyroidism of renal origin: Secondary | ICD-10-CM | POA: Diagnosis not present

## 2018-07-26 DIAGNOSIS — I82409 Acute embolism and thrombosis of unspecified deep veins of unspecified lower extremity: Secondary | ICD-10-CM | POA: Diagnosis not present

## 2018-07-26 DIAGNOSIS — M25552 Pain in left hip: Secondary | ICD-10-CM | POA: Diagnosis not present

## 2018-07-28 DIAGNOSIS — D631 Anemia in chronic kidney disease: Secondary | ICD-10-CM | POA: Diagnosis not present

## 2018-07-28 DIAGNOSIS — N2581 Secondary hyperparathyroidism of renal origin: Secondary | ICD-10-CM | POA: Diagnosis not present

## 2018-07-28 DIAGNOSIS — N186 End stage renal disease: Secondary | ICD-10-CM | POA: Diagnosis not present

## 2018-07-30 DIAGNOSIS — N186 End stage renal disease: Secondary | ICD-10-CM | POA: Diagnosis not present

## 2018-07-30 DIAGNOSIS — D631 Anemia in chronic kidney disease: Secondary | ICD-10-CM | POA: Diagnosis not present

## 2018-07-30 DIAGNOSIS — N2581 Secondary hyperparathyroidism of renal origin: Secondary | ICD-10-CM | POA: Diagnosis not present

## 2018-08-02 DIAGNOSIS — N186 End stage renal disease: Secondary | ICD-10-CM | POA: Diagnosis not present

## 2018-08-02 DIAGNOSIS — I82409 Acute embolism and thrombosis of unspecified deep veins of unspecified lower extremity: Secondary | ICD-10-CM | POA: Diagnosis not present

## 2018-08-02 DIAGNOSIS — D631 Anemia in chronic kidney disease: Secondary | ICD-10-CM | POA: Diagnosis not present

## 2018-08-02 DIAGNOSIS — N2581 Secondary hyperparathyroidism of renal origin: Secondary | ICD-10-CM | POA: Diagnosis not present

## 2018-08-04 DIAGNOSIS — N2581 Secondary hyperparathyroidism of renal origin: Secondary | ICD-10-CM | POA: Diagnosis not present

## 2018-08-04 DIAGNOSIS — N186 End stage renal disease: Secondary | ICD-10-CM | POA: Diagnosis not present

## 2018-08-04 DIAGNOSIS — D631 Anemia in chronic kidney disease: Secondary | ICD-10-CM | POA: Diagnosis not present

## 2018-08-06 DIAGNOSIS — D631 Anemia in chronic kidney disease: Secondary | ICD-10-CM | POA: Diagnosis not present

## 2018-08-06 DIAGNOSIS — Z992 Dependence on renal dialysis: Secondary | ICD-10-CM | POA: Diagnosis not present

## 2018-08-06 DIAGNOSIS — N2581 Secondary hyperparathyroidism of renal origin: Secondary | ICD-10-CM | POA: Diagnosis not present

## 2018-08-06 DIAGNOSIS — N186 End stage renal disease: Secondary | ICD-10-CM | POA: Diagnosis not present

## 2018-08-09 DIAGNOSIS — N186 End stage renal disease: Secondary | ICD-10-CM | POA: Diagnosis not present

## 2018-08-09 DIAGNOSIS — N2581 Secondary hyperparathyroidism of renal origin: Secondary | ICD-10-CM | POA: Diagnosis not present

## 2018-08-09 DIAGNOSIS — D509 Iron deficiency anemia, unspecified: Secondary | ICD-10-CM | POA: Diagnosis not present

## 2018-08-09 DIAGNOSIS — I82409 Acute embolism and thrombosis of unspecified deep veins of unspecified lower extremity: Secondary | ICD-10-CM | POA: Diagnosis not present

## 2018-08-11 DIAGNOSIS — E1139 Type 2 diabetes mellitus with other diabetic ophthalmic complication: Secondary | ICD-10-CM | POA: Diagnosis not present

## 2018-08-11 DIAGNOSIS — N186 End stage renal disease: Secondary | ICD-10-CM | POA: Diagnosis not present

## 2018-08-11 DIAGNOSIS — N2581 Secondary hyperparathyroidism of renal origin: Secondary | ICD-10-CM | POA: Diagnosis not present

## 2018-08-11 DIAGNOSIS — D509 Iron deficiency anemia, unspecified: Secondary | ICD-10-CM | POA: Diagnosis not present

## 2018-08-13 DIAGNOSIS — N2581 Secondary hyperparathyroidism of renal origin: Secondary | ICD-10-CM | POA: Diagnosis not present

## 2018-08-13 DIAGNOSIS — N186 End stage renal disease: Secondary | ICD-10-CM | POA: Diagnosis not present

## 2018-08-13 DIAGNOSIS — D509 Iron deficiency anemia, unspecified: Secondary | ICD-10-CM | POA: Diagnosis not present

## 2018-08-16 DIAGNOSIS — I82409 Acute embolism and thrombosis of unspecified deep veins of unspecified lower extremity: Secondary | ICD-10-CM | POA: Diagnosis not present

## 2018-08-16 DIAGNOSIS — N2581 Secondary hyperparathyroidism of renal origin: Secondary | ICD-10-CM | POA: Diagnosis not present

## 2018-08-16 DIAGNOSIS — D509 Iron deficiency anemia, unspecified: Secondary | ICD-10-CM | POA: Diagnosis not present

## 2018-08-16 DIAGNOSIS — N186 End stage renal disease: Secondary | ICD-10-CM | POA: Diagnosis not present

## 2018-08-18 DIAGNOSIS — N2581 Secondary hyperparathyroidism of renal origin: Secondary | ICD-10-CM | POA: Diagnosis not present

## 2018-08-18 DIAGNOSIS — D631 Anemia in chronic kidney disease: Secondary | ICD-10-CM | POA: Diagnosis not present

## 2018-08-18 DIAGNOSIS — N186 End stage renal disease: Secondary | ICD-10-CM | POA: Diagnosis not present

## 2018-08-20 DIAGNOSIS — N186 End stage renal disease: Secondary | ICD-10-CM | POA: Diagnosis not present

## 2018-08-20 DIAGNOSIS — N2581 Secondary hyperparathyroidism of renal origin: Secondary | ICD-10-CM | POA: Diagnosis not present

## 2018-08-20 DIAGNOSIS — D631 Anemia in chronic kidney disease: Secondary | ICD-10-CM | POA: Diagnosis not present

## 2018-08-23 DIAGNOSIS — N2581 Secondary hyperparathyroidism of renal origin: Secondary | ICD-10-CM | POA: Diagnosis not present

## 2018-08-23 DIAGNOSIS — D631 Anemia in chronic kidney disease: Secondary | ICD-10-CM | POA: Diagnosis not present

## 2018-08-23 DIAGNOSIS — I82409 Acute embolism and thrombosis of unspecified deep veins of unspecified lower extremity: Secondary | ICD-10-CM | POA: Diagnosis not present

## 2018-08-23 DIAGNOSIS — N186 End stage renal disease: Secondary | ICD-10-CM | POA: Diagnosis not present

## 2018-08-24 ENCOUNTER — Ambulatory Visit (INDEPENDENT_AMBULATORY_CARE_PROVIDER_SITE_OTHER): Payer: Medicare HMO | Admitting: Family Medicine

## 2018-08-24 ENCOUNTER — Encounter: Payer: Self-pay | Admitting: Family Medicine

## 2018-08-24 ENCOUNTER — Other Ambulatory Visit: Payer: Self-pay

## 2018-08-24 VITALS — BP 97/52 | HR 79 | Ht 62.0 in | Wt 153.0 lb

## 2018-08-24 DIAGNOSIS — E559 Vitamin D deficiency, unspecified: Secondary | ICD-10-CM | POA: Diagnosis not present

## 2018-08-24 DIAGNOSIS — Z992 Dependence on renal dialysis: Secondary | ICD-10-CM | POA: Diagnosis not present

## 2018-08-24 DIAGNOSIS — N186 End stage renal disease: Secondary | ICD-10-CM

## 2018-08-24 DIAGNOSIS — I10 Essential (primary) hypertension: Secondary | ICD-10-CM

## 2018-08-24 DIAGNOSIS — E782 Mixed hyperlipidemia: Secondary | ICD-10-CM

## 2018-08-24 DIAGNOSIS — M160 Bilateral primary osteoarthritis of hip: Secondary | ICD-10-CM | POA: Diagnosis not present

## 2018-08-24 NOTE — Assessment & Plan Note (Signed)
On dialysis 3 days/week.

## 2018-08-24 NOTE — Assessment & Plan Note (Signed)
BP well controlled. Acutally a littl low today.  Due for labs.

## 2018-08-24 NOTE — Assessment & Plan Note (Signed)
Due to recheck levels 

## 2018-08-24 NOTE — Assessment & Plan Note (Signed)
Joy Anderson in October for right hip replacement.  She is quite excited about this and is hoping she will be able to get rid of her walker after that.

## 2018-08-24 NOTE — Progress Notes (Signed)
Established Patient Office Visit  Subjective:  Patient ID: Joy Anderson, female    DOB: 06/28/1950  Age: 68 y.o. MRN: [id number]  CC:  Chief Complaint  Patient presents with  . Hypertension    HPI [name] presents for   Hypertension- Pt denies chest pain, SOB, dizziness, or heart palpitations.  Taking meds as directed w/o problems.  Denies medication side effects.  She also has vitamin D deficiency and is due to recheck her lab work.  He did receive her Cologuard kit but just has not returned it yet.  End-stage renal dialysis-she is currently on dialysis on Mondays, Wednesdays and Fridays.  She says her blood pressure is usually a little bit low right after dialysis and sometimes has to sit and wait before she is allowed to go home.  On days where she does not have dialysis it usually does come up into the 120s in regards to her blood pressure.  He also wanted let me know that she is having her right hip replacement, finally on October 14 with Dr. Leonides Schanz.    Past Medical History:  Diagnosis Date  . Antiphospholipid antibody with hypercoagulable state ([place]) 08/27/2010  . DVT (deep venous thrombosis) (HCC)    anti phospholipid antibody + -- Dr Lewanda Rife  . Hypertension   . OAB (overactive bladder)    off meds  . Post-menopausal   . Proteinuria    Dr Tressie Ellis  . Raynaud's disease     Past Surgical History:  Procedure Laterality Date  . AV FISTULA PLACEMENT  2016  . CATARACT EXTRACTION, BILATERAL  2017  . TOTAL ABDOMINAL HYSTERECTOMY  2001   for fibroids w/ 1 oophorectomy    Family History  Problem Relation Age of Onset  . Alcohol abuse Father   . Colon cancer Brother   . Prostate cancer Brother   . Cerebral palsy Brother   . Diabetes Other     Social History   Socioeconomic History  . Marital status: Divorced    Spouse name: Not on file  . Number of children: Not on file  . Years of education: Not on file  . Highest education level: Not on file   Occupational History  . Not on file  Social Needs  . Financial resource strain: Not on file  . Food insecurity    Worry: Not on file    Inability: Not on file  . Transportation needs    Medical: Not on file    Non-medical: Not on file  Tobacco Use  . Smoking status: Never Smoker  . Smokeless tobacco: Never Used  Substance and Sexual Activity  . Alcohol use: Yes    Comment: occasional  . Drug use: No  . Sexual activity: Not on file  Lifestyle  . Physical activity    Days per week: Not on file    Minutes per session: Not on file  . Stress: Not on file  Relationships  . Social Herbalist on phone: Not on file    Gets together: Not on file    Attends religious service: Not on file    Active member of club or organization: Not on file    Attends meetings of clubs or organizations: Not on file    Relationship status: Not on file  . Intimate partner violence    Fear of current or ex partner: Not on file    Emotionally abused: Not on file    Physically abused: Not on  file    Forced sexual activity: Not on file  Other Topics Concern  . Not on file  Social History Narrative  . Not on file    Outpatient Medications Prior to Visit  Medication Sig Dispense Refill  . amLODipine (NORVASC) 5 MG tablet Take 5 mg by mouth every evening.    Marland Kitchen atorvastatin (LIPITOR) 20 MG tablet Take 1 tablet (20 mg total) by mouth at bedtime. Due for follow up visit 90 tablet 3  . CALCIUM PO Take by mouth.    . cholecalciferol (VITAMIN D) 1000 units tablet Take 1,000 Units by mouth daily.    . cinacalcet (SENSIPAR) 30 MG tablet Take 2 tablets by mouth daily.    . hydrocortisone 2.5 % cream Apply 1 application topically 2 (two) times daily as needed. 45 g 1  . hydroxychloroquine (PLAQUENIL) 200 MG tablet Take 200 mg by mouth 2 (two) times daily.      Marland Kitchen losartan (COZAAR) 100 MG tablet Take 100 mg by mouth daily.    . multivitamin (RENA-VIT) TABS tablet     . warfarin (COUMADIN) 1 MG tablet      . warfarin (COUMADIN) 4 MG tablet Take by mouth.    . warfarin (COUMADIN) 5 MG tablet TAKE 1 TABLET BY MOUTH ON SUNDAY, WENESDAY, THURSDAY, FRIDAY, AND SATURDAY. THEN TAKE 1 AND 1/2 TABLET ON MONDAY AND TUESDAY 97 tablet 1  . cinacalcet (SENSIPAR) 30 MG tablet Take 30 mg by mouth 2 (two) times daily.      No facility-administered medications prior to visit.     Allergies  Allergen Reactions  . Ace Inhibitors     REACTION: cough Other reaction(s): Other (See Comments) REACTION: cough  . Metformin     Other reaction(s): Other (See Comments) Nausea, decreased appetite Nausea, decreased appetite  . Fish Oil Other (See Comments)    Other reaction(s): Other (See Comments)  . Metformin And Related Other (See Comments)    Nausea, decreased appetite    ROS Review of Systems    Objective:    Physical Exam  BP (!) 97/52   Pulse 79   Ht '5\' 2"'$  (1.575 m)   Wt 153 lb (69.4 kg)   LMP 03/18/1999   SpO2 98%   BMI 27.98 kg/m  Wt Readings from Last 3 Encounters:  08/24/18 153 lb (69.4 kg)  02/23/18 153 lb (69.4 kg)  08/20/17 151 lb (68.5 kg)     Health Maintenance Due  Topic Date Due  . DEXA SCAN  09/18/2017  . COLONOSCOPY  04/15/2018  . INFLUENZA VACCINE  08/07/2018    There are no preventive care reminders to display for this patient.  Lab Results  Component Value Date   TSH 2.357 04/25/2014   Lab Results  Component Value Date   WBC 7.5 09/09/2017   HGB 10.0 (A) 09/09/2017   HCT 30 (A) 09/09/2017   MCV 83.0 04/25/2014   PLT 230 09/09/2017   Lab Results  Component Value Date   NA 145 09/09/2017   K 3.8 09/09/2017   CO2 24 08/06/2016   GLUCOSE 71 04/25/2014   BUN 61 (A) 09/09/2017   CREATININE 10.9 (A) 09/09/2017   BILITOT 0.3 04/25/2014   ALKPHOS 78 09/09/2017   AST 15 09/09/2017   ALT 18 09/09/2017   PROT 6.6 04/25/2014   ALBUMIN 4.5 08/06/2016   ALBUMIN 4.5 08/06/2016   CALCIUM 9.6 08/06/2016   CALCIUM 9.6 08/06/2016   Lab Results  Component  Value Date  CHOL 131 03/05/2017   Lab Results  Component Value Date   HDL 42 (L) 03/05/2017   Lab Results  Component Value Date   LDLCALC 68 03/05/2017   Lab Results  Component Value Date   TRIG 126 03/05/2017   Lab Results  Component Value Date   CHOLHDL 3.1 03/05/2017   Lab Results  Component Value Date   HGBA1C 5.2 09/01/2017      Assessment & Plan:   Problem List Items Addressed This Visit      Cardiovascular and Mediastinum   ESSENTIAL HYPERTENSION, BENIGN    BP well controlled. Acutally a littl low today.  Due for labs.        Relevant Orders   Lipid panel   Vitamin D (25 hydroxy)   COMPLETE METABOLIC PANEL WITH GFR     Musculoskeletal and Integument   Osteoarthritis, hip, bilateral    Remo Lipps in October for right hip replacement.  She is quite excited about this and is hoping she will be able to get rid of her walker after that.        Genitourinary   ESRD on dialysis ([place]) - Primary    On dialysis 3 days/week.      Relevant Orders   Lipid panel   Vitamin D (25 hydroxy)   COMPLETE METABOLIC PANEL WITH GFR     Other   Vitamin D deficiency    Due to recheck levels.      Relevant Orders   Lipid panel   Vitamin D (25 hydroxy)   COMPLETE METABOLIC PANEL WITH GFR   Elevated triglycerides with high cholesterol   Relevant Orders   Lipid panel   Vitamin D (25 hydroxy)   COMPLETE METABOLIC PANEL WITH GFR      No orders of the defined types were placed in this encounter.   Follow-up: Return in about 6 months (around 02/24/2019) for Hypertension.    [name], MD

## 2018-08-25 DIAGNOSIS — D631 Anemia in chronic kidney disease: Secondary | ICD-10-CM | POA: Diagnosis not present

## 2018-08-25 DIAGNOSIS — N2581 Secondary hyperparathyroidism of renal origin: Secondary | ICD-10-CM | POA: Diagnosis not present

## 2018-08-25 DIAGNOSIS — N186 End stage renal disease: Secondary | ICD-10-CM | POA: Diagnosis not present

## 2018-08-27 DIAGNOSIS — N2581 Secondary hyperparathyroidism of renal origin: Secondary | ICD-10-CM | POA: Diagnosis not present

## 2018-08-27 DIAGNOSIS — N186 End stage renal disease: Secondary | ICD-10-CM | POA: Diagnosis not present

## 2018-08-27 DIAGNOSIS — D631 Anemia in chronic kidney disease: Secondary | ICD-10-CM | POA: Diagnosis not present

## 2018-08-30 DIAGNOSIS — I82409 Acute embolism and thrombosis of unspecified deep veins of unspecified lower extremity: Secondary | ICD-10-CM | POA: Diagnosis not present

## 2018-08-30 DIAGNOSIS — D631 Anemia in chronic kidney disease: Secondary | ICD-10-CM | POA: Diagnosis not present

## 2018-08-30 DIAGNOSIS — N186 End stage renal disease: Secondary | ICD-10-CM | POA: Diagnosis not present

## 2018-08-30 DIAGNOSIS — N2581 Secondary hyperparathyroidism of renal origin: Secondary | ICD-10-CM | POA: Diagnosis not present

## 2018-09-01 DIAGNOSIS — N186 End stage renal disease: Secondary | ICD-10-CM | POA: Diagnosis not present

## 2018-09-01 DIAGNOSIS — D631 Anemia in chronic kidney disease: Secondary | ICD-10-CM | POA: Diagnosis not present

## 2018-09-01 DIAGNOSIS — N2581 Secondary hyperparathyroidism of renal origin: Secondary | ICD-10-CM | POA: Diagnosis not present

## 2018-09-02 DIAGNOSIS — E782 Mixed hyperlipidemia: Secondary | ICD-10-CM | POA: Diagnosis not present

## 2018-09-02 DIAGNOSIS — N186 End stage renal disease: Secondary | ICD-10-CM | POA: Diagnosis not present

## 2018-09-02 DIAGNOSIS — E559 Vitamin D deficiency, unspecified: Secondary | ICD-10-CM | POA: Diagnosis not present

## 2018-09-02 DIAGNOSIS — Z992 Dependence on renal dialysis: Secondary | ICD-10-CM | POA: Diagnosis not present

## 2018-09-02 DIAGNOSIS — I10 Essential (primary) hypertension: Secondary | ICD-10-CM | POA: Diagnosis not present

## 2018-09-03 DIAGNOSIS — N2581 Secondary hyperparathyroidism of renal origin: Secondary | ICD-10-CM | POA: Diagnosis not present

## 2018-09-03 DIAGNOSIS — D631 Anemia in chronic kidney disease: Secondary | ICD-10-CM | POA: Diagnosis not present

## 2018-09-03 DIAGNOSIS — N186 End stage renal disease: Secondary | ICD-10-CM | POA: Diagnosis not present

## 2018-09-03 LAB — LIPID PANEL
Cholesterol: 131 mg/dL (ref <200)
HDL: 34 mg/dL — ABNORMAL LOW (ref >=50)
LDL Cholesterol (Calc): 69 mg/dL (calc)
Non-HDL Cholesterol (Calc): 97 mg/dL (calc) (ref <130)
Total CHOL/HDL Ratio: 3.9 (calc) (ref <5.0)
Triglycerides: 227 mg/dL — ABNORMAL HIGH (ref <150)

## 2018-09-03 LAB — COMPLETE METABOLIC PANEL WITH GFR
AG Ratio: 1.4 (calc) (ref 1.0–2.5)
ALT: 15 U/L (ref 6–29)
AST: 14 U/L (ref 10–35)
Albumin: 4.4 g/dL (ref 3.6–5.1)
Alkaline phosphatase (APISO): 72 U/L (ref 37–153)
BUN/Creatinine Ratio: 2 (calc) — ABNORMAL LOW (ref 6–22)
BUN: 18 mg/dL (ref 7–25)
CO2: 30 mmol/L (ref 20–32)
Calcium: 9.7 mg/dL (ref 8.6–10.4)
Chloride: 98 mmol/L (ref 98–110)
Creat: 7.79 mg/dL — ABNORMAL HIGH (ref 0.50–0.99)
GFR, Est African American: 6 mL/min/{1.73_m2} — ABNORMAL LOW (ref >=60)
GFR, Est Non African American: 5 mL/min/{1.73_m2} — ABNORMAL LOW (ref >=60)
Globulin: 3.1 g/dL (calc) (ref 1.9–3.7)
Glucose, Bld: 90 mg/dL (ref 65–99)
Potassium: 4.2 mmol/L (ref 3.5–5.3)
Sodium: 142 mmol/L (ref 135–146)
Total Bilirubin: 0.4 mg/dL (ref 0.2–1.2)
Total Protein: 7.5 g/dL (ref 6.1–8.1)

## 2018-09-03 LAB — VITAMIN D 25 HYDROXY (VIT D DEFICIENCY, FRACTURES): Vit D, 25-Hydroxy: 62 ng/mL (ref 30–100)

## 2018-09-06 DIAGNOSIS — I82409 Acute embolism and thrombosis of unspecified deep veins of unspecified lower extremity: Secondary | ICD-10-CM | POA: Diagnosis not present

## 2018-09-06 DIAGNOSIS — N2581 Secondary hyperparathyroidism of renal origin: Secondary | ICD-10-CM | POA: Diagnosis not present

## 2018-09-06 DIAGNOSIS — N186 End stage renal disease: Secondary | ICD-10-CM | POA: Diagnosis not present

## 2018-09-06 DIAGNOSIS — D631 Anemia in chronic kidney disease: Secondary | ICD-10-CM | POA: Diagnosis not present

## 2018-09-06 DIAGNOSIS — Z992 Dependence on renal dialysis: Secondary | ICD-10-CM | POA: Diagnosis not present

## 2018-09-08 DIAGNOSIS — E1139 Type 2 diabetes mellitus with other diabetic ophthalmic complication: Secondary | ICD-10-CM | POA: Diagnosis not present

## 2018-09-08 DIAGNOSIS — D509 Iron deficiency anemia, unspecified: Secondary | ICD-10-CM | POA: Diagnosis not present

## 2018-09-08 DIAGNOSIS — D631 Anemia in chronic kidney disease: Secondary | ICD-10-CM | POA: Diagnosis not present

## 2018-09-08 DIAGNOSIS — N2581 Secondary hyperparathyroidism of renal origin: Secondary | ICD-10-CM | POA: Diagnosis not present

## 2018-09-08 DIAGNOSIS — N186 End stage renal disease: Secondary | ICD-10-CM | POA: Diagnosis not present

## 2018-09-10 DIAGNOSIS — N186 End stage renal disease: Secondary | ICD-10-CM | POA: Diagnosis not present

## 2018-09-10 DIAGNOSIS — D509 Iron deficiency anemia, unspecified: Secondary | ICD-10-CM | POA: Diagnosis not present

## 2018-09-10 DIAGNOSIS — D631 Anemia in chronic kidney disease: Secondary | ICD-10-CM | POA: Diagnosis not present

## 2018-09-10 DIAGNOSIS — N2581 Secondary hyperparathyroidism of renal origin: Secondary | ICD-10-CM | POA: Diagnosis not present

## 2018-09-13 DIAGNOSIS — D509 Iron deficiency anemia, unspecified: Secondary | ICD-10-CM | POA: Diagnosis not present

## 2018-09-13 DIAGNOSIS — N186 End stage renal disease: Secondary | ICD-10-CM | POA: Diagnosis not present

## 2018-09-13 DIAGNOSIS — I82409 Acute embolism and thrombosis of unspecified deep veins of unspecified lower extremity: Secondary | ICD-10-CM | POA: Diagnosis not present

## 2018-09-13 DIAGNOSIS — N2581 Secondary hyperparathyroidism of renal origin: Secondary | ICD-10-CM | POA: Diagnosis not present

## 2018-09-13 DIAGNOSIS — D631 Anemia in chronic kidney disease: Secondary | ICD-10-CM | POA: Diagnosis not present

## 2018-09-15 DIAGNOSIS — D509 Iron deficiency anemia, unspecified: Secondary | ICD-10-CM | POA: Diagnosis not present

## 2018-09-15 DIAGNOSIS — D631 Anemia in chronic kidney disease: Secondary | ICD-10-CM | POA: Diagnosis not present

## 2018-09-15 DIAGNOSIS — N2581 Secondary hyperparathyroidism of renal origin: Secondary | ICD-10-CM | POA: Diagnosis not present

## 2018-09-15 DIAGNOSIS — N186 End stage renal disease: Secondary | ICD-10-CM | POA: Diagnosis not present

## 2018-09-17 DIAGNOSIS — D509 Iron deficiency anemia, unspecified: Secondary | ICD-10-CM | POA: Diagnosis not present

## 2018-09-17 DIAGNOSIS — D631 Anemia in chronic kidney disease: Secondary | ICD-10-CM | POA: Diagnosis not present

## 2018-09-17 DIAGNOSIS — N2581 Secondary hyperparathyroidism of renal origin: Secondary | ICD-10-CM | POA: Diagnosis not present

## 2018-09-17 DIAGNOSIS — N186 End stage renal disease: Secondary | ICD-10-CM | POA: Diagnosis not present

## 2018-09-20 DIAGNOSIS — N186 End stage renal disease: Secondary | ICD-10-CM | POA: Diagnosis not present

## 2018-09-20 DIAGNOSIS — I82409 Acute embolism and thrombosis of unspecified deep veins of unspecified lower extremity: Secondary | ICD-10-CM | POA: Diagnosis not present

## 2018-09-20 DIAGNOSIS — N2581 Secondary hyperparathyroidism of renal origin: Secondary | ICD-10-CM | POA: Diagnosis not present

## 2018-09-20 DIAGNOSIS — D631 Anemia in chronic kidney disease: Secondary | ICD-10-CM | POA: Diagnosis not present

## 2018-09-20 DIAGNOSIS — D509 Iron deficiency anemia, unspecified: Secondary | ICD-10-CM | POA: Diagnosis not present

## 2018-09-22 DIAGNOSIS — N186 End stage renal disease: Secondary | ICD-10-CM | POA: Diagnosis not present

## 2018-09-22 DIAGNOSIS — D631 Anemia in chronic kidney disease: Secondary | ICD-10-CM | POA: Diagnosis not present

## 2018-09-22 DIAGNOSIS — N2581 Secondary hyperparathyroidism of renal origin: Secondary | ICD-10-CM | POA: Diagnosis not present

## 2018-09-22 DIAGNOSIS — D509 Iron deficiency anemia, unspecified: Secondary | ICD-10-CM | POA: Diagnosis not present

## 2018-09-24 DIAGNOSIS — D631 Anemia in chronic kidney disease: Secondary | ICD-10-CM | POA: Diagnosis not present

## 2018-09-24 DIAGNOSIS — D509 Iron deficiency anemia, unspecified: Secondary | ICD-10-CM | POA: Diagnosis not present

## 2018-09-24 DIAGNOSIS — N186 End stage renal disease: Secondary | ICD-10-CM | POA: Diagnosis not present

## 2018-09-24 DIAGNOSIS — N2581 Secondary hyperparathyroidism of renal origin: Secondary | ICD-10-CM | POA: Diagnosis not present

## 2018-09-27 DIAGNOSIS — N2581 Secondary hyperparathyroidism of renal origin: Secondary | ICD-10-CM | POA: Diagnosis not present

## 2018-09-27 DIAGNOSIS — D631 Anemia in chronic kidney disease: Secondary | ICD-10-CM | POA: Diagnosis not present

## 2018-09-27 DIAGNOSIS — I82409 Acute embolism and thrombosis of unspecified deep veins of unspecified lower extremity: Secondary | ICD-10-CM | POA: Diagnosis not present

## 2018-09-27 DIAGNOSIS — N186 End stage renal disease: Secondary | ICD-10-CM | POA: Diagnosis not present

## 2018-09-28 DIAGNOSIS — I7300 Raynaud's syndrome without gangrene: Secondary | ICD-10-CM | POA: Diagnosis not present

## 2018-09-28 DIAGNOSIS — R760 Raised antibody titer: Secondary | ICD-10-CM | POA: Diagnosis not present

## 2018-09-28 DIAGNOSIS — M329 Systemic lupus erythematosus, unspecified: Secondary | ICD-10-CM | POA: Diagnosis not present

## 2018-09-28 DIAGNOSIS — N186 End stage renal disease: Secondary | ICD-10-CM | POA: Diagnosis not present

## 2018-09-29 DIAGNOSIS — N186 End stage renal disease: Secondary | ICD-10-CM | POA: Diagnosis not present

## 2018-09-29 DIAGNOSIS — D631 Anemia in chronic kidney disease: Secondary | ICD-10-CM | POA: Diagnosis not present

## 2018-09-29 DIAGNOSIS — N2581 Secondary hyperparathyroidism of renal origin: Secondary | ICD-10-CM | POA: Diagnosis not present

## 2018-10-01 DIAGNOSIS — N2581 Secondary hyperparathyroidism of renal origin: Secondary | ICD-10-CM | POA: Diagnosis not present

## 2018-10-01 DIAGNOSIS — N186 End stage renal disease: Secondary | ICD-10-CM | POA: Diagnosis not present

## 2018-10-01 DIAGNOSIS — D631 Anemia in chronic kidney disease: Secondary | ICD-10-CM | POA: Diagnosis not present

## 2018-10-04 DIAGNOSIS — D631 Anemia in chronic kidney disease: Secondary | ICD-10-CM | POA: Diagnosis not present

## 2018-10-04 DIAGNOSIS — N186 End stage renal disease: Secondary | ICD-10-CM | POA: Diagnosis not present

## 2018-10-04 DIAGNOSIS — N2581 Secondary hyperparathyroidism of renal origin: Secondary | ICD-10-CM | POA: Diagnosis not present

## 2018-10-04 DIAGNOSIS — I82409 Acute embolism and thrombosis of unspecified deep veins of unspecified lower extremity: Secondary | ICD-10-CM | POA: Diagnosis not present

## 2018-10-05 DIAGNOSIS — M1611 Unilateral primary osteoarthritis, right hip: Secondary | ICD-10-CM | POA: Diagnosis not present

## 2018-10-05 DIAGNOSIS — N186 End stage renal disease: Secondary | ICD-10-CM | POA: Diagnosis not present

## 2018-10-06 DIAGNOSIS — D649 Anemia, unspecified: Secondary | ICD-10-CM | POA: Diagnosis not present

## 2018-10-06 DIAGNOSIS — Z531 Procedure and treatment not carried out because of patient's decision for reasons of belief and group pressure: Secondary | ICD-10-CM | POA: Diagnosis not present

## 2018-10-06 DIAGNOSIS — R9431 Abnormal electrocardiogram [ECG] [EKG]: Secondary | ICD-10-CM | POA: Diagnosis not present

## 2018-10-06 DIAGNOSIS — R760 Raised antibody titer: Secondary | ICD-10-CM | POA: Diagnosis not present

## 2018-10-06 DIAGNOSIS — Z01812 Encounter for preprocedural laboratory examination: Secondary | ICD-10-CM | POA: Diagnosis not present

## 2018-10-06 DIAGNOSIS — N186 End stage renal disease: Secondary | ICD-10-CM | POA: Diagnosis not present

## 2018-10-06 DIAGNOSIS — Z01818 Encounter for other preprocedural examination: Secondary | ICD-10-CM | POA: Diagnosis not present

## 2018-10-06 DIAGNOSIS — Z01810 Encounter for preprocedural cardiovascular examination: Secondary | ICD-10-CM | POA: Diagnosis not present

## 2018-10-06 DIAGNOSIS — N2581 Secondary hyperparathyroidism of renal origin: Secondary | ICD-10-CM | POA: Diagnosis not present

## 2018-10-06 DIAGNOSIS — M329 Systemic lupus erythematosus, unspecified: Secondary | ICD-10-CM | POA: Diagnosis not present

## 2018-10-06 DIAGNOSIS — Z86718 Personal history of other venous thrombosis and embolism: Secondary | ICD-10-CM | POA: Diagnosis not present

## 2018-10-06 DIAGNOSIS — D631 Anemia in chronic kidney disease: Secondary | ICD-10-CM | POA: Diagnosis not present

## 2018-10-06 DIAGNOSIS — Z992 Dependence on renal dialysis: Secondary | ICD-10-CM | POA: Diagnosis not present

## 2018-10-06 DIAGNOSIS — Z20828 Contact with and (suspected) exposure to other viral communicable diseases: Secondary | ICD-10-CM | POA: Diagnosis not present

## 2018-10-06 DIAGNOSIS — I10 Essential (primary) hypertension: Secondary | ICD-10-CM | POA: Diagnosis not present

## 2018-10-06 DIAGNOSIS — M1611 Unilateral primary osteoarthritis, right hip: Secondary | ICD-10-CM | POA: Diagnosis not present

## 2018-10-07 HISTORY — PX: HIP SURGERY: SHX245

## 2018-10-08 DIAGNOSIS — N2581 Secondary hyperparathyroidism of renal origin: Secondary | ICD-10-CM | POA: Diagnosis not present

## 2018-10-08 DIAGNOSIS — N186 End stage renal disease: Secondary | ICD-10-CM | POA: Diagnosis not present

## 2018-10-08 DIAGNOSIS — D509 Iron deficiency anemia, unspecified: Secondary | ICD-10-CM | POA: Diagnosis not present

## 2018-10-11 DIAGNOSIS — N2581 Secondary hyperparathyroidism of renal origin: Secondary | ICD-10-CM | POA: Diagnosis not present

## 2018-10-11 DIAGNOSIS — N186 End stage renal disease: Secondary | ICD-10-CM | POA: Diagnosis not present

## 2018-10-11 DIAGNOSIS — I82409 Acute embolism and thrombosis of unspecified deep veins of unspecified lower extremity: Secondary | ICD-10-CM | POA: Diagnosis not present

## 2018-10-11 DIAGNOSIS — D509 Iron deficiency anemia, unspecified: Secondary | ICD-10-CM | POA: Diagnosis not present

## 2018-10-13 DIAGNOSIS — N186 End stage renal disease: Secondary | ICD-10-CM | POA: Diagnosis not present

## 2018-10-13 DIAGNOSIS — N2581 Secondary hyperparathyroidism of renal origin: Secondary | ICD-10-CM | POA: Diagnosis not present

## 2018-10-13 DIAGNOSIS — E1139 Type 2 diabetes mellitus with other diabetic ophthalmic complication: Secondary | ICD-10-CM | POA: Diagnosis not present

## 2018-10-13 DIAGNOSIS — D509 Iron deficiency anemia, unspecified: Secondary | ICD-10-CM | POA: Diagnosis not present

## 2018-10-15 DIAGNOSIS — N2581 Secondary hyperparathyroidism of renal origin: Secondary | ICD-10-CM | POA: Diagnosis not present

## 2018-10-15 DIAGNOSIS — D509 Iron deficiency anemia, unspecified: Secondary | ICD-10-CM | POA: Diagnosis not present

## 2018-10-15 DIAGNOSIS — N186 End stage renal disease: Secondary | ICD-10-CM | POA: Diagnosis not present

## 2018-10-18 DIAGNOSIS — Z23 Encounter for immunization: Secondary | ICD-10-CM | POA: Diagnosis not present

## 2018-10-18 DIAGNOSIS — I82409 Acute embolism and thrombosis of unspecified deep veins of unspecified lower extremity: Secondary | ICD-10-CM | POA: Diagnosis not present

## 2018-10-18 DIAGNOSIS — D631 Anemia in chronic kidney disease: Secondary | ICD-10-CM | POA: Diagnosis not present

## 2018-10-18 DIAGNOSIS — N2581 Secondary hyperparathyroidism of renal origin: Secondary | ICD-10-CM | POA: Diagnosis not present

## 2018-10-18 DIAGNOSIS — N186 End stage renal disease: Secondary | ICD-10-CM | POA: Diagnosis not present

## 2018-10-20 DIAGNOSIS — N2581 Secondary hyperparathyroidism of renal origin: Secondary | ICD-10-CM | POA: Diagnosis not present

## 2018-10-20 DIAGNOSIS — Z23 Encounter for immunization: Secondary | ICD-10-CM | POA: Diagnosis not present

## 2018-10-20 DIAGNOSIS — N186 End stage renal disease: Secondary | ICD-10-CM | POA: Diagnosis not present

## 2018-10-20 DIAGNOSIS — D631 Anemia in chronic kidney disease: Secondary | ICD-10-CM | POA: Diagnosis not present

## 2018-10-22 DIAGNOSIS — N186 End stage renal disease: Secondary | ICD-10-CM | POA: Diagnosis not present

## 2018-10-22 DIAGNOSIS — D631 Anemia in chronic kidney disease: Secondary | ICD-10-CM | POA: Diagnosis not present

## 2018-10-22 DIAGNOSIS — Z23 Encounter for immunization: Secondary | ICD-10-CM | POA: Diagnosis not present

## 2018-10-22 DIAGNOSIS — N2581 Secondary hyperparathyroidism of renal origin: Secondary | ICD-10-CM | POA: Diagnosis not present

## 2018-10-25 DIAGNOSIS — N186 End stage renal disease: Secondary | ICD-10-CM | POA: Diagnosis not present

## 2018-10-25 DIAGNOSIS — N2581 Secondary hyperparathyroidism of renal origin: Secondary | ICD-10-CM | POA: Diagnosis not present

## 2018-10-25 DIAGNOSIS — Z23 Encounter for immunization: Secondary | ICD-10-CM | POA: Diagnosis not present

## 2018-10-25 DIAGNOSIS — I82409 Acute embolism and thrombosis of unspecified deep veins of unspecified lower extremity: Secondary | ICD-10-CM | POA: Diagnosis not present

## 2018-10-25 DIAGNOSIS — D631 Anemia in chronic kidney disease: Secondary | ICD-10-CM | POA: Diagnosis not present

## 2018-10-27 DIAGNOSIS — M1611 Unilateral primary osteoarthritis, right hip: Secondary | ICD-10-CM | POA: Diagnosis not present

## 2018-10-27 DIAGNOSIS — N2581 Secondary hyperparathyroidism of renal origin: Secondary | ICD-10-CM | POA: Diagnosis not present

## 2018-10-27 DIAGNOSIS — N186 End stage renal disease: Secondary | ICD-10-CM | POA: Diagnosis not present

## 2018-10-29 DIAGNOSIS — N186 End stage renal disease: Secondary | ICD-10-CM | POA: Diagnosis not present

## 2018-10-29 DIAGNOSIS — N2581 Secondary hyperparathyroidism of renal origin: Secondary | ICD-10-CM | POA: Diagnosis not present

## 2018-10-31 DIAGNOSIS — Z01818 Encounter for other preprocedural examination: Secondary | ICD-10-CM | POA: Diagnosis not present

## 2018-11-01 DIAGNOSIS — N2581 Secondary hyperparathyroidism of renal origin: Secondary | ICD-10-CM | POA: Diagnosis not present

## 2018-11-01 DIAGNOSIS — M879 Osteonecrosis, unspecified: Secondary | ICD-10-CM | POA: Diagnosis not present

## 2018-11-01 DIAGNOSIS — D631 Anemia in chronic kidney disease: Secondary | ICD-10-CM | POA: Diagnosis not present

## 2018-11-01 DIAGNOSIS — N186 End stage renal disease: Secondary | ICD-10-CM | POA: Diagnosis not present

## 2018-11-01 DIAGNOSIS — R110 Nausea: Secondary | ICD-10-CM | POA: Diagnosis not present

## 2018-11-01 DIAGNOSIS — M87851 Other osteonecrosis, right femur: Secondary | ICD-10-CM | POA: Diagnosis not present

## 2018-11-01 DIAGNOSIS — M1611 Unilateral primary osteoarthritis, right hip: Secondary | ICD-10-CM | POA: Diagnosis not present

## 2018-11-01 DIAGNOSIS — Z86718 Personal history of other venous thrombosis and embolism: Secondary | ICD-10-CM | POA: Diagnosis not present

## 2018-11-01 DIAGNOSIS — I82409 Acute embolism and thrombosis of unspecified deep veins of unspecified lower extremity: Secondary | ICD-10-CM | POA: Diagnosis not present

## 2018-11-01 DIAGNOSIS — Z7901 Long term (current) use of anticoagulants: Secondary | ICD-10-CM | POA: Diagnosis not present

## 2018-11-01 DIAGNOSIS — Z96641 Presence of right artificial hip joint: Secondary | ICD-10-CM | POA: Diagnosis not present

## 2018-11-01 DIAGNOSIS — I120 Hypertensive chronic kidney disease with stage 5 chronic kidney disease or end stage renal disease: Secondary | ICD-10-CM | POA: Diagnosis not present

## 2018-11-01 DIAGNOSIS — M87051 Idiopathic aseptic necrosis of right femur: Secondary | ICD-10-CM | POA: Diagnosis not present

## 2018-11-01 DIAGNOSIS — D6862 Lupus anticoagulant syndrome: Secondary | ICD-10-CM | POA: Diagnosis not present

## 2018-11-01 DIAGNOSIS — Z471 Aftercare following joint replacement surgery: Secondary | ICD-10-CM | POA: Diagnosis not present

## 2018-11-01 DIAGNOSIS — D500 Iron deficiency anemia secondary to blood loss (chronic): Secondary | ICD-10-CM | POA: Diagnosis not present

## 2018-11-01 DIAGNOSIS — Z992 Dependence on renal dialysis: Secondary | ICD-10-CM | POA: Diagnosis not present

## 2018-11-01 DIAGNOSIS — E781 Pure hyperglyceridemia: Secondary | ICD-10-CM | POA: Diagnosis not present

## 2018-11-01 DIAGNOSIS — E782 Mixed hyperlipidemia: Secondary | ICD-10-CM | POA: Diagnosis not present

## 2018-11-01 DIAGNOSIS — E1122 Type 2 diabetes mellitus with diabetic chronic kidney disease: Secondary | ICD-10-CM | POA: Diagnosis not present

## 2018-11-01 DIAGNOSIS — M329 Systemic lupus erythematosus, unspecified: Secondary | ICD-10-CM | POA: Diagnosis not present

## 2018-11-02 DIAGNOSIS — N2581 Secondary hyperparathyroidism of renal origin: Secondary | ICD-10-CM | POA: Diagnosis not present

## 2018-11-02 DIAGNOSIS — N186 End stage renal disease: Secondary | ICD-10-CM | POA: Diagnosis not present

## 2018-11-03 DIAGNOSIS — Z96641 Presence of right artificial hip joint: Secondary | ICD-10-CM | POA: Diagnosis not present

## 2018-11-03 DIAGNOSIS — Z471 Aftercare following joint replacement surgery: Secondary | ICD-10-CM | POA: Diagnosis not present

## 2018-11-03 DIAGNOSIS — M1611 Unilateral primary osteoarthritis, right hip: Secondary | ICD-10-CM | POA: Diagnosis not present

## 2018-11-04 DIAGNOSIS — Z992 Dependence on renal dialysis: Secondary | ICD-10-CM | POA: Diagnosis not present

## 2018-11-04 DIAGNOSIS — N186 End stage renal disease: Secondary | ICD-10-CM | POA: Diagnosis not present

## 2018-11-04 DIAGNOSIS — M1611 Unilateral primary osteoarthritis, right hip: Secondary | ICD-10-CM | POA: Diagnosis not present

## 2018-11-05 DIAGNOSIS — N186 End stage renal disease: Secondary | ICD-10-CM | POA: Diagnosis not present

## 2018-11-05 DIAGNOSIS — Z992 Dependence on renal dialysis: Secondary | ICD-10-CM | POA: Diagnosis not present

## 2018-11-06 DIAGNOSIS — N186 End stage renal disease: Secondary | ICD-10-CM | POA: Diagnosis not present

## 2018-11-06 DIAGNOSIS — Z992 Dependence on renal dialysis: Secondary | ICD-10-CM | POA: Diagnosis not present

## 2018-11-08 DIAGNOSIS — N186 End stage renal disease: Secondary | ICD-10-CM | POA: Diagnosis not present

## 2018-11-08 DIAGNOSIS — Z992 Dependence on renal dialysis: Secondary | ICD-10-CM | POA: Diagnosis not present

## 2018-11-10 DIAGNOSIS — D500 Iron deficiency anemia secondary to blood loss (chronic): Secondary | ICD-10-CM | POA: Diagnosis not present

## 2018-11-10 DIAGNOSIS — M159 Polyosteoarthritis, unspecified: Secondary | ICD-10-CM | POA: Diagnosis not present

## 2018-11-10 DIAGNOSIS — M109 Gout, unspecified: Secondary | ICD-10-CM | POA: Diagnosis not present

## 2018-11-10 DIAGNOSIS — N186 End stage renal disease: Secondary | ICD-10-CM | POA: Diagnosis not present

## 2018-11-10 DIAGNOSIS — I120 Hypertensive chronic kidney disease with stage 5 chronic kidney disease or end stage renal disease: Secondary | ICD-10-CM | POA: Diagnosis not present

## 2018-11-10 DIAGNOSIS — E559 Vitamin D deficiency, unspecified: Secondary | ICD-10-CM | POA: Diagnosis not present

## 2018-11-10 DIAGNOSIS — E1122 Type 2 diabetes mellitus with diabetic chronic kidney disease: Secondary | ICD-10-CM | POA: Diagnosis not present

## 2018-11-10 DIAGNOSIS — M329 Systemic lupus erythematosus, unspecified: Secondary | ICD-10-CM | POA: Diagnosis not present

## 2018-11-10 DIAGNOSIS — Z471 Aftercare following joint replacement surgery: Secondary | ICD-10-CM | POA: Diagnosis not present

## 2018-11-12 ENCOUNTER — Other Ambulatory Visit: Payer: Self-pay

## 2018-11-12 DIAGNOSIS — N2581 Secondary hyperparathyroidism of renal origin: Secondary | ICD-10-CM | POA: Diagnosis not present

## 2018-11-12 DIAGNOSIS — D509 Iron deficiency anemia, unspecified: Secondary | ICD-10-CM | POA: Diagnosis not present

## 2018-11-12 DIAGNOSIS — N186 End stage renal disease: Secondary | ICD-10-CM | POA: Diagnosis not present

## 2018-11-12 DIAGNOSIS — E1139 Type 2 diabetes mellitus with other diabetic ophthalmic complication: Secondary | ICD-10-CM | POA: Diagnosis not present

## 2018-11-12 NOTE — Patient Outreach (Signed)
[name]) Care Management  11/12/2018  Joy Anderson December 16, 1950 [id number]     Transition of Care Referral  Referral Date: 11/12/2018 Referral Source: [facility] Discharge Report Date of Admission: unknown Diagnosis: osteoarthritis right hip -s/p right hip replacement Date of Discharge: 11/09/2018 Facility: [place]: [name]    Outreach attempt # 1 to patient.  No answer. RN CM left HIPAA compliant voicemail message along with contact info.    Plan: RN CM will make outreach attempt to patient within 3-4 business days. RN CM will send unsuccessful outreach letter to patient.   Enzo Montgomery, RN,BSN,CCM [place] Management Telephonic Care Management Coordinator Direct Phone: [phone] Toll Free: [phone] Fax: [phone]

## 2018-11-15 ENCOUNTER — Other Ambulatory Visit: Payer: Self-pay

## 2018-11-15 DIAGNOSIS — D509 Iron deficiency anemia, unspecified: Secondary | ICD-10-CM | POA: Diagnosis not present

## 2018-11-15 DIAGNOSIS — N186 End stage renal disease: Secondary | ICD-10-CM | POA: Diagnosis not present

## 2018-11-15 DIAGNOSIS — N2581 Secondary hyperparathyroidism of renal origin: Secondary | ICD-10-CM | POA: Diagnosis not present

## 2018-11-15 DIAGNOSIS — I82409 Acute embolism and thrombosis of unspecified deep veins of unspecified lower extremity: Secondary | ICD-10-CM | POA: Diagnosis not present

## 2018-11-15 NOTE — Patient Outreach (Signed)
[place] [facility]) Care Management  11/15/2018  STEFFANI DIONISIO [birth year] [id number]   [place], Social Work referral received from [facility], Enzo Montgomery, to outreach patient regarding transportation resources. Successful outreach to patient today.  Patient's son typically provides transportation to dialysis and MD appointments, however, he is starting a new job so patient needs other option(s).   Patient was aware of transportation benefit through [facility] so she was provided with contact information for scheduling.  Unfortunately, these rides are likely very limited and will be exhausted quickly.  Messaged [facility] [place] [facility] regarding transportation options for patient.  Will follow up with her when response is received.  [name], BSW Social Worker [phone]

## 2018-11-15 NOTE — Patient Outreach (Addendum)
[place] [facility]) Care Management  11/15/2018  Joy Anderson [birth year] [id number]  Initial Assessment  Transition of Care Referral  Referral Date: 11/12/2018 Referral Source: [facility] Discharge Report Date of Admission: unknown Diagnosis: osteoarthritis right hip -s/p right hip replacement Date of Discharge: 11/09/2018 Facility: [place]: [facility]   Outreach attempt #2 to patient. Spoke with patient. She voices that she is doing well and things are going well since she returned home. She denies any acute issues or concerns at present. She is pleased to report that she is up moving around and ambulating. Patient reports that pain has been very minimal. She has prescription pain med in the home but voices she is not taking it as she has not needed. She stets that she has been taking Tylenol ES about 1-2x/day and it has relieved and managed her pain. She lives with her daughter and granddaughter [name] re supportive. Patient also states that son normally takes her back and forth to DM appts and HD treatments. However, he got a new job and starting next week she will not have any transportation to get to appts. RN Cm discussed SW referral for possible transportation resources. She gave verbal consent. RN CM also discussed [employer] transportation benefit and provided patient with contact info to follow up with them. Patient also inquiring about if she qualifies for Medicaid.   Conditions: Per chart review, patient has PMH that includes but not limited to ESRD(M,W,F),HTN, DVT, overactive bladder and Raynaud's disease and OA. She had recent right hip replacement surgery. Patient report HHPT will be out tomorrow to visit with her.  Appointments: Patient reports she has upcoming MD appts on 11/23/2018 and 12/28/2018  Medications: Med review completed with patient. She denies any issues managing and/or affording meds at this time. No  issues with meds noted.  Current Medications:  Current Outpatient Medications  Medication Sig Dispense Refill  . acetaminophen (TYLENOL) 500 MG tablet Take 500 mg by mouth every 6 (six) hours as needed. Two tablets    . amLODipine (NORVASC) 5 MG tablet Take 5 mg by mouth every evening.    Marland Kitchen atorvastatin (LIPITOR) 20 MG tablet Take 1 tablet (20 mg total) by mouth at bedtime. Due for follow up visit 90 tablet 3  . CALCIUM PO Take by mouth.    . cinacalcet (SENSIPAR) 30 MG tablet Take 2 tablets by mouth daily.    . folic acid (FOLVITE) 1 MG tablet Take 1 mg by mouth daily.    Marland Kitchen HYDROcodone-acetaminophen (NORCO/VICODIN) 5-325 MG tablet Take 1 tablet by mouth every 4 (four) hours as needed for moderate pain.    . hydrocortisone 2.5 % cream Apply 1 application topically 2 (two) times daily as needed. 45 g 1  . hydroxychloroquine (PLAQUENIL) 200 MG tablet Take 200 mg by mouth 2 (two) times daily.      Marland Kitchen losartan (COZAAR) 100 MG tablet Take 100 mg by mouth daily.    . multivitamin (RENA-VIT) TABS tablet     . warfarin (COUMADIN) 5 MG tablet TAKE 1 TABLET BY MOUTH ON SUNDAY, WENESDAY, THURSDAY, FRIDAY, AND SATURDAY. THEN TAKE 1 AND 1/2 TABLET ON MONDAY AND TUESDAY (Patient taking differently: Patient states taking 2.5mg (half tablet) daily) 97 tablet 1  . cholecalciferol (VITAMIN D) 1000 units tablet Take 1,000 Units by mouth daily.    Marland Kitchen warfarin (COUMADIN) 1 MG tablet     . warfarin (COUMADIN) 4 MG tablet Take by mouth.  No current facility-administered medications for this visit.     Functional Status:  In your present state of health, do you have any difficulty performing the following activities: 11/15/2018  Hearing? N  Vision? N  Difficulty concentrating or making decisions? N  Walking or climbing stairs? Y  Comment recent hip replacement surgery  Dressing or bathing? N  Doing errands, shopping? Y  Comment unable to drive-recent hip surgery  Preparing Food and eating ? N  Using the  Toilet? N  In the past six months, have you accidently leaked urine? N  Do you have problems with loss of bowel control? N  Managing your Medications? N  Managing your Finances? N  Housekeeping or managing your Housekeeping? Y  Comment lives with daughter who handles it  Some recent data might be hidden    Fall/Depression Screening: Fall Risk  11/15/2018 08/24/2018 08/20/2017  Falls in the past year? 1 0 No  Number falls in past yr: 0 - -  Injury with Fall? 0 - -  Risk for fall due to : History of fall(s) Impaired balance/gait Impaired balance/gait  Follow up Falls evaluation completed;Education provided - -   PHQ 2/9 Scores 11/15/2018 08/20/2017 08/19/2016 08/16/2015 08/16/2015 04/25/2014 10/28/2012  PHQ - 2 Score 0 0 0 0 0 0 0   Consent:  THN services reviewed and discussed with patient. Verbal consent for services given.  Assessment: THN CM Care Plan Problem One     Most Recent Value  Care Plan Problem One  Patient at risk for hospital readmission due to co-morbidities.  Role Documenting the Problem One  Care Management Telephonic [place] for Problem One  Active  [facility] Long Term Goal   Patient will have no hosptial readmission within the next 31 days.  THN Long Term Goal Start Date  11/15/18  Interventions for Problem One Long Term Goal  RN CM assessed for any acute issues and sxs. RN CM discussed with patient when to seek medical attention for worsening sxs. RN CM confirmed pt. has MD contact info and knows when,how and why to call them.  THN CM Short Term Goal #1   patient will complete all post discharge MD follow up appts over the next 30 days.  THN CM Short Term Goal #1 Start Date  11/15/18  Interventions for Short Term Goal #1  RNCM reviewed with pt. appt dates/times. RN CM assessed for transportation barriers. RN CM placed [facility] SW referral for transportation assistance.  THN CM Short Term Goal #2   Patient will report that surgical site heals without s/s of infection  or complications within the next 30 days.  THN CM Short Term Goal #2 Start Date  11/15/18  Interventions for Short Term Goal #2  RNC M assessed for pain and s/s of infection. RN CM reviewed post op care with pt.   THN CM Short Term Goal #3  Patient will actively participate and engage with HHPT to improve mobility and strength. over the next 30 days.  THN CM Short Term Goal #3 Start Date  11/15/18  Interventions for Short Tern Goal #3  RNCM confirmed Rew services in place and has been in contact with pt. RN CM assesed for falls/safety mesures. RN CM assessed for adequate DME in the home.        Plan: RN CM discussed with patient next outreach within a week. Patient gave verbal consent and in agreement with RN CM follow up timeframe. Patient aware that they may  contact RN CM sooner for any issues or concerns. RN CM will send welcome letter to patient. RN CM will send barriers letter and route encounter to PCP. RN CM will send [facility] SW referral for transportation assistance and discuss Medicaid.    Enzo Montgomery, RN,BSN,CCM [place] Management Telephonic Care Management Coordinator Direct Phone: [phone] Toll Free: [phone] Fax: [phone]

## 2018-11-16 DIAGNOSIS — D500 Iron deficiency anemia secondary to blood loss (chronic): Secondary | ICD-10-CM | POA: Diagnosis not present

## 2018-11-16 DIAGNOSIS — I120 Hypertensive chronic kidney disease with stage 5 chronic kidney disease or end stage renal disease: Secondary | ICD-10-CM | POA: Diagnosis not present

## 2018-11-16 DIAGNOSIS — N186 End stage renal disease: Secondary | ICD-10-CM | POA: Diagnosis not present

## 2018-11-16 DIAGNOSIS — M159 Polyosteoarthritis, unspecified: Secondary | ICD-10-CM | POA: Diagnosis not present

## 2018-11-16 DIAGNOSIS — M329 Systemic lupus erythematosus, unspecified: Secondary | ICD-10-CM | POA: Diagnosis not present

## 2018-11-16 DIAGNOSIS — E1122 Type 2 diabetes mellitus with diabetic chronic kidney disease: Secondary | ICD-10-CM | POA: Diagnosis not present

## 2018-11-16 DIAGNOSIS — M109 Gout, unspecified: Secondary | ICD-10-CM | POA: Diagnosis not present

## 2018-11-16 DIAGNOSIS — E559 Vitamin D deficiency, unspecified: Secondary | ICD-10-CM | POA: Diagnosis not present

## 2018-11-16 DIAGNOSIS — Z471 Aftercare following joint replacement surgery: Secondary | ICD-10-CM | POA: Diagnosis not present

## 2018-11-17 DIAGNOSIS — D631 Anemia in chronic kidney disease: Secondary | ICD-10-CM | POA: Diagnosis not present

## 2018-11-17 DIAGNOSIS — N186 End stage renal disease: Secondary | ICD-10-CM | POA: Diagnosis not present

## 2018-11-17 DIAGNOSIS — N2581 Secondary hyperparathyroidism of renal origin: Secondary | ICD-10-CM | POA: Diagnosis not present

## 2018-11-17 DIAGNOSIS — D509 Iron deficiency anemia, unspecified: Secondary | ICD-10-CM | POA: Diagnosis not present

## 2018-11-18 DIAGNOSIS — E559 Vitamin D deficiency, unspecified: Secondary | ICD-10-CM | POA: Diagnosis not present

## 2018-11-18 DIAGNOSIS — I120 Hypertensive chronic kidney disease with stage 5 chronic kidney disease or end stage renal disease: Secondary | ICD-10-CM | POA: Diagnosis not present

## 2018-11-18 DIAGNOSIS — M159 Polyosteoarthritis, unspecified: Secondary | ICD-10-CM | POA: Diagnosis not present

## 2018-11-18 DIAGNOSIS — Z471 Aftercare following joint replacement surgery: Secondary | ICD-10-CM | POA: Diagnosis not present

## 2018-11-18 DIAGNOSIS — D500 Iron deficiency anemia secondary to blood loss (chronic): Secondary | ICD-10-CM | POA: Diagnosis not present

## 2018-11-18 DIAGNOSIS — N186 End stage renal disease: Secondary | ICD-10-CM | POA: Diagnosis not present

## 2018-11-18 DIAGNOSIS — M109 Gout, unspecified: Secondary | ICD-10-CM | POA: Diagnosis not present

## 2018-11-18 DIAGNOSIS — M329 Systemic lupus erythematosus, unspecified: Secondary | ICD-10-CM | POA: Diagnosis not present

## 2018-11-18 DIAGNOSIS — E1122 Type 2 diabetes mellitus with diabetic chronic kidney disease: Secondary | ICD-10-CM | POA: Diagnosis not present

## 2018-11-19 ENCOUNTER — Other Ambulatory Visit: Payer: Self-pay

## 2018-11-19 DIAGNOSIS — N186 End stage renal disease: Secondary | ICD-10-CM | POA: Diagnosis not present

## 2018-11-19 DIAGNOSIS — N2581 Secondary hyperparathyroidism of renal origin: Secondary | ICD-10-CM | POA: Diagnosis not present

## 2018-11-19 DIAGNOSIS — D509 Iron deficiency anemia, unspecified: Secondary | ICD-10-CM | POA: Diagnosis not present

## 2018-11-19 DIAGNOSIS — D631 Anemia in chronic kidney disease: Secondary | ICD-10-CM | POA: Diagnosis not present

## 2018-11-19 NOTE — Patient Outreach (Signed)
[place] [facility]) Care Management  11/19/2018  Joy Anderson [birth year] [id number]   Follow up call to patient today regarding referral for transportation resources.  Received communication from [employer] with [facility] that [name] is best resource for transportation to ongoing dialysis appointments.  Provided patient with contact information for scheduling.  [name] also provided list of volunteer transportation options and patient requested this be mailed to her. Encouraged patient to contact [facility] customer service to verify transportation benefit and number of rides available. Talked with patient about Medicaid eligibility and application process.  She requested that application be mailed to her.  Will follow up within the next two weeks to ensure receipt.  [name], BSW Social Worker [phone]

## 2018-11-22 ENCOUNTER — Other Ambulatory Visit: Payer: Self-pay

## 2018-11-22 DIAGNOSIS — I82409 Acute embolism and thrombosis of unspecified deep veins of unspecified lower extremity: Secondary | ICD-10-CM | POA: Diagnosis not present

## 2018-11-22 DIAGNOSIS — N186 End stage renal disease: Secondary | ICD-10-CM | POA: Diagnosis not present

## 2018-11-22 DIAGNOSIS — N2581 Secondary hyperparathyroidism of renal origin: Secondary | ICD-10-CM | POA: Diagnosis not present

## 2018-11-22 DIAGNOSIS — D509 Iron deficiency anemia, unspecified: Secondary | ICD-10-CM | POA: Diagnosis not present

## 2018-11-22 DIAGNOSIS — D631 Anemia in chronic kidney disease: Secondary | ICD-10-CM | POA: Diagnosis not present

## 2018-11-22 NOTE — Patient Outreach (Addendum)
[place] [facility]) Care Management  11/22/2018  Joy Anderson 1950/11/12 [id number]   Transition of Care  Week #2    Outreach attempt #1 to patient. Spoke briefly with patient as she is getting ready to go to dialysis. She denies any acute issues or concerns at present. She states that her pain remains controlled. She denies any pain at present. She does report that during HD txs she sometimes gets uncomfortable from having to be in the same position for an extended time. She normally pre-medicates with Tylenol prior to treatments and states that this helps. She denies any RNCM needs or concerns at this time.   THN CM Care Plan Problem One     Most Recent Value  Care Plan Problem One  Patient at risk for hospital readmission due to co-morbidities.  Role Documenting the Problem One  Care Management Telephonic [place] for Problem One  Active  [facility] Long Term Goal   Patient will have no hosptial readmission within the next 31 days.  THN Long Term Goal Start Date  11/15/18  Interventions for Problem One Long Term Goal  RN CM assessed for any acute issues or concerns. RN CM reviewed action plan with pt.  THN CM Short Term Goal #1   patient will complete all post discharge MD follow up appts over the next 30 days.  THN CM Short Term Goal #1 Start Date  11/15/18  Interventions for Short Term Goal #1  RN CM confirmed that pt. has spoken with [facility] SW re: transportation options  THN CM Short Term Goal #2   Patient will report that surgical site heals without s/s of infection or complications within the next 30 days.  THN CM Short Term Goal #2 Start Date  11/15/18  Interventions for Short Term Goal #2  RN CM assessed for any s/s of infection and increased pain. RN CM confirmed with pt that area is healing properly.  THN CM Short Term Goal #3  Patient will actively participate and engage with HHPT to improve mobility and strength. over the next 30 days.  THN CM Short Term  Goal #3 Start Date  11/15/18  Interventions for Short Tern Goal #3  RN CM assessed for any barriers preventing pt from working/progressing with HHPT. RN CM completed pain assmt and discussed importance of adequate pain control.     Plan: RN CM discussed with patient next outreach within a week. Patient gave verbal consent and in agreement with RN CM follow up timeframe. Patient aware that they may contact RN CM sooner for any issues or concerns.   Enzo Montgomery, RN,BSN,CCM [place] Management Telephonic Care Management Coordinator Direct Phone: [phone] Toll Free: [phone] Fax: [phone]

## 2018-11-23 DIAGNOSIS — M159 Polyosteoarthritis, unspecified: Secondary | ICD-10-CM | POA: Diagnosis not present

## 2018-11-23 DIAGNOSIS — N186 End stage renal disease: Secondary | ICD-10-CM | POA: Diagnosis not present

## 2018-11-23 DIAGNOSIS — M109 Gout, unspecified: Secondary | ICD-10-CM | POA: Diagnosis not present

## 2018-11-23 DIAGNOSIS — M1611 Unilateral primary osteoarthritis, right hip: Secondary | ICD-10-CM | POA: Diagnosis not present

## 2018-11-23 DIAGNOSIS — I120 Hypertensive chronic kidney disease with stage 5 chronic kidney disease or end stage renal disease: Secondary | ICD-10-CM | POA: Diagnosis not present

## 2018-11-23 DIAGNOSIS — E559 Vitamin D deficiency, unspecified: Secondary | ICD-10-CM | POA: Diagnosis not present

## 2018-11-23 DIAGNOSIS — M329 Systemic lupus erythematosus, unspecified: Secondary | ICD-10-CM | POA: Diagnosis not present

## 2018-11-23 DIAGNOSIS — D500 Iron deficiency anemia secondary to blood loss (chronic): Secondary | ICD-10-CM | POA: Diagnosis not present

## 2018-11-23 DIAGNOSIS — E1122 Type 2 diabetes mellitus with diabetic chronic kidney disease: Secondary | ICD-10-CM | POA: Diagnosis not present

## 2018-11-23 DIAGNOSIS — Z471 Aftercare following joint replacement surgery: Secondary | ICD-10-CM | POA: Diagnosis not present

## 2018-11-24 DIAGNOSIS — D631 Anemia in chronic kidney disease: Secondary | ICD-10-CM | POA: Diagnosis not present

## 2018-11-24 DIAGNOSIS — N186 End stage renal disease: Secondary | ICD-10-CM | POA: Diagnosis not present

## 2018-11-24 DIAGNOSIS — N2581 Secondary hyperparathyroidism of renal origin: Secondary | ICD-10-CM | POA: Diagnosis not present

## 2018-11-24 DIAGNOSIS — D509 Iron deficiency anemia, unspecified: Secondary | ICD-10-CM | POA: Diagnosis not present

## 2018-11-25 DIAGNOSIS — D500 Iron deficiency anemia secondary to blood loss (chronic): Secondary | ICD-10-CM | POA: Diagnosis not present

## 2018-11-25 DIAGNOSIS — I120 Hypertensive chronic kidney disease with stage 5 chronic kidney disease or end stage renal disease: Secondary | ICD-10-CM | POA: Diagnosis not present

## 2018-11-25 DIAGNOSIS — E1122 Type 2 diabetes mellitus with diabetic chronic kidney disease: Secondary | ICD-10-CM | POA: Diagnosis not present

## 2018-11-25 DIAGNOSIS — N186 End stage renal disease: Secondary | ICD-10-CM | POA: Diagnosis not present

## 2018-11-25 DIAGNOSIS — M329 Systemic lupus erythematosus, unspecified: Secondary | ICD-10-CM | POA: Diagnosis not present

## 2018-11-25 DIAGNOSIS — E559 Vitamin D deficiency, unspecified: Secondary | ICD-10-CM | POA: Diagnosis not present

## 2018-11-25 DIAGNOSIS — M159 Polyosteoarthritis, unspecified: Secondary | ICD-10-CM | POA: Diagnosis not present

## 2018-11-25 DIAGNOSIS — Z471 Aftercare following joint replacement surgery: Secondary | ICD-10-CM | POA: Diagnosis not present

## 2018-11-25 DIAGNOSIS — M109 Gout, unspecified: Secondary | ICD-10-CM | POA: Diagnosis not present

## 2018-11-26 DIAGNOSIS — D509 Iron deficiency anemia, unspecified: Secondary | ICD-10-CM | POA: Diagnosis not present

## 2018-11-26 DIAGNOSIS — N2581 Secondary hyperparathyroidism of renal origin: Secondary | ICD-10-CM | POA: Diagnosis not present

## 2018-11-26 DIAGNOSIS — D631 Anemia in chronic kidney disease: Secondary | ICD-10-CM | POA: Diagnosis not present

## 2018-11-26 DIAGNOSIS — N186 End stage renal disease: Secondary | ICD-10-CM | POA: Diagnosis not present

## 2018-11-28 DIAGNOSIS — N186 End stage renal disease: Secondary | ICD-10-CM | POA: Diagnosis not present

## 2018-11-28 DIAGNOSIS — D631 Anemia in chronic kidney disease: Secondary | ICD-10-CM | POA: Diagnosis not present

## 2018-11-28 DIAGNOSIS — I82409 Acute embolism and thrombosis of unspecified deep veins of unspecified lower extremity: Secondary | ICD-10-CM | POA: Diagnosis not present

## 2018-11-28 DIAGNOSIS — N2581 Secondary hyperparathyroidism of renal origin: Secondary | ICD-10-CM | POA: Diagnosis not present

## 2018-11-28 DIAGNOSIS — D509 Iron deficiency anemia, unspecified: Secondary | ICD-10-CM | POA: Diagnosis not present

## 2018-11-29 ENCOUNTER — Other Ambulatory Visit: Payer: Self-pay

## 2018-11-29 NOTE — Patient Outreach (Signed)
[place] [facility]) Care Management  11/29/2018  Joy Anderson [birth year] [id number]    Transition of Care Week #3    Outreach attempt # 1 to patient. Spoke with patient who voices she is doing well. She denies any acute issues or concerns. She continues to report minimal to no pain and managing pain with occasional Tylenol. She states that she is up moving around and walking several times throughout the day without any issues. She denies any acute issues or concerns at present. Patient has completed ST-TOC program and is agreeable to follow up within a month. She reports that she did receive packet in the mail form [facility] SW. She has started on the [facility] application but has not completed. RN CM encouraged patient to compete it and to contact SW if she needed any assistance. She voiced understanding.    Plan: RN CM discussed with patient next outreach within a month. Patient gave verbal consent and in agreement with RN CM follow up timeframe. Patient aware that they may contact RN CM sooner for any issues or concerns.   Enzo Montgomery, RN,BSN,CCM [place] Management Telephonic Care Management Coordinator Direct Phone: [phone] Toll Free: [phone] Fax: [phone]

## 2018-11-30 DIAGNOSIS — N2581 Secondary hyperparathyroidism of renal origin: Secondary | ICD-10-CM | POA: Diagnosis not present

## 2018-11-30 DIAGNOSIS — N186 End stage renal disease: Secondary | ICD-10-CM | POA: Diagnosis not present

## 2018-11-30 DIAGNOSIS — D509 Iron deficiency anemia, unspecified: Secondary | ICD-10-CM | POA: Diagnosis not present

## 2018-11-30 DIAGNOSIS — D631 Anemia in chronic kidney disease: Secondary | ICD-10-CM | POA: Diagnosis not present

## 2018-12-01 ENCOUNTER — Other Ambulatory Visit: Payer: Self-pay

## 2018-12-01 NOTE — Patient Outreach (Signed)
[facility]) Care Management  12/01/2018  Joy Anderson 04-26-1950 [id number]   Follow up call to patient today.  She confirmed receipt of Medicaid application and transportation resources that were mailed on 11/19/18.  Patient denied need for further assistance at this time. Closing social work case but did encourage her to call if additional needs arise.  [name], BSW Social Worker [phone]

## 2018-12-03 DIAGNOSIS — N186 End stage renal disease: Secondary | ICD-10-CM | POA: Diagnosis not present

## 2018-12-03 DIAGNOSIS — N2581 Secondary hyperparathyroidism of renal origin: Secondary | ICD-10-CM | POA: Diagnosis not present

## 2018-12-03 DIAGNOSIS — D631 Anemia in chronic kidney disease: Secondary | ICD-10-CM | POA: Diagnosis not present

## 2018-12-03 DIAGNOSIS — D509 Iron deficiency anemia, unspecified: Secondary | ICD-10-CM | POA: Diagnosis not present

## 2018-12-06 DIAGNOSIS — D631 Anemia in chronic kidney disease: Secondary | ICD-10-CM | POA: Diagnosis not present

## 2018-12-06 DIAGNOSIS — I82409 Acute embolism and thrombosis of unspecified deep veins of unspecified lower extremity: Secondary | ICD-10-CM | POA: Diagnosis not present

## 2018-12-06 DIAGNOSIS — N2581 Secondary hyperparathyroidism of renal origin: Secondary | ICD-10-CM | POA: Diagnosis not present

## 2018-12-06 DIAGNOSIS — N186 End stage renal disease: Secondary | ICD-10-CM | POA: Diagnosis not present

## 2018-12-06 DIAGNOSIS — Z992 Dependence on renal dialysis: Secondary | ICD-10-CM | POA: Diagnosis not present

## 2018-12-06 DIAGNOSIS — D509 Iron deficiency anemia, unspecified: Secondary | ICD-10-CM | POA: Diagnosis not present

## 2018-12-08 DIAGNOSIS — N2581 Secondary hyperparathyroidism of renal origin: Secondary | ICD-10-CM | POA: Diagnosis not present

## 2018-12-08 DIAGNOSIS — N186 End stage renal disease: Secondary | ICD-10-CM | POA: Diagnosis not present

## 2018-12-08 DIAGNOSIS — D509 Iron deficiency anemia, unspecified: Secondary | ICD-10-CM | POA: Diagnosis not present

## 2018-12-08 DIAGNOSIS — E1139 Type 2 diabetes mellitus with other diabetic ophthalmic complication: Secondary | ICD-10-CM | POA: Diagnosis not present

## 2018-12-08 DIAGNOSIS — D631 Anemia in chronic kidney disease: Secondary | ICD-10-CM | POA: Diagnosis not present

## 2018-12-09 DIAGNOSIS — Z471 Aftercare following joint replacement surgery: Secondary | ICD-10-CM | POA: Diagnosis not present

## 2018-12-09 DIAGNOSIS — M329 Systemic lupus erythematosus, unspecified: Secondary | ICD-10-CM | POA: Diagnosis not present

## 2018-12-09 DIAGNOSIS — N186 End stage renal disease: Secondary | ICD-10-CM | POA: Diagnosis not present

## 2018-12-09 DIAGNOSIS — M109 Gout, unspecified: Secondary | ICD-10-CM | POA: Diagnosis not present

## 2018-12-09 DIAGNOSIS — E559 Vitamin D deficiency, unspecified: Secondary | ICD-10-CM | POA: Diagnosis not present

## 2018-12-09 DIAGNOSIS — I120 Hypertensive chronic kidney disease with stage 5 chronic kidney disease or end stage renal disease: Secondary | ICD-10-CM | POA: Diagnosis not present

## 2018-12-09 DIAGNOSIS — M159 Polyosteoarthritis, unspecified: Secondary | ICD-10-CM | POA: Diagnosis not present

## 2018-12-09 DIAGNOSIS — D500 Iron deficiency anemia secondary to blood loss (chronic): Secondary | ICD-10-CM | POA: Diagnosis not present

## 2018-12-09 DIAGNOSIS — E1122 Type 2 diabetes mellitus with diabetic chronic kidney disease: Secondary | ICD-10-CM | POA: Diagnosis not present

## 2018-12-10 DIAGNOSIS — N186 End stage renal disease: Secondary | ICD-10-CM | POA: Diagnosis not present

## 2018-12-10 DIAGNOSIS — D509 Iron deficiency anemia, unspecified: Secondary | ICD-10-CM | POA: Diagnosis not present

## 2018-12-10 DIAGNOSIS — D631 Anemia in chronic kidney disease: Secondary | ICD-10-CM | POA: Diagnosis not present

## 2018-12-10 DIAGNOSIS — N2581 Secondary hyperparathyroidism of renal origin: Secondary | ICD-10-CM | POA: Diagnosis not present

## 2018-12-13 DIAGNOSIS — D509 Iron deficiency anemia, unspecified: Secondary | ICD-10-CM | POA: Diagnosis not present

## 2018-12-13 DIAGNOSIS — N2581 Secondary hyperparathyroidism of renal origin: Secondary | ICD-10-CM | POA: Diagnosis not present

## 2018-12-13 DIAGNOSIS — N186 End stage renal disease: Secondary | ICD-10-CM | POA: Diagnosis not present

## 2018-12-13 DIAGNOSIS — D631 Anemia in chronic kidney disease: Secondary | ICD-10-CM | POA: Diagnosis not present

## 2018-12-13 DIAGNOSIS — I82409 Acute embolism and thrombosis of unspecified deep veins of unspecified lower extremity: Secondary | ICD-10-CM | POA: Diagnosis not present

## 2018-12-15 DIAGNOSIS — N186 End stage renal disease: Secondary | ICD-10-CM | POA: Diagnosis not present

## 2018-12-15 DIAGNOSIS — D631 Anemia in chronic kidney disease: Secondary | ICD-10-CM | POA: Diagnosis not present

## 2018-12-15 DIAGNOSIS — D509 Iron deficiency anemia, unspecified: Secondary | ICD-10-CM | POA: Diagnosis not present

## 2018-12-15 DIAGNOSIS — N2581 Secondary hyperparathyroidism of renal origin: Secondary | ICD-10-CM | POA: Diagnosis not present

## 2018-12-17 DIAGNOSIS — D631 Anemia in chronic kidney disease: Secondary | ICD-10-CM | POA: Diagnosis not present

## 2018-12-17 DIAGNOSIS — D509 Iron deficiency anemia, unspecified: Secondary | ICD-10-CM | POA: Diagnosis not present

## 2018-12-17 DIAGNOSIS — N186 End stage renal disease: Secondary | ICD-10-CM | POA: Diagnosis not present

## 2018-12-17 DIAGNOSIS — N2581 Secondary hyperparathyroidism of renal origin: Secondary | ICD-10-CM | POA: Diagnosis not present

## 2018-12-20 DIAGNOSIS — N2581 Secondary hyperparathyroidism of renal origin: Secondary | ICD-10-CM | POA: Diagnosis not present

## 2018-12-20 DIAGNOSIS — D509 Iron deficiency anemia, unspecified: Secondary | ICD-10-CM | POA: Diagnosis not present

## 2018-12-20 DIAGNOSIS — I82409 Acute embolism and thrombosis of unspecified deep veins of unspecified lower extremity: Secondary | ICD-10-CM | POA: Diagnosis not present

## 2018-12-20 DIAGNOSIS — N186 End stage renal disease: Secondary | ICD-10-CM | POA: Diagnosis not present

## 2018-12-20 DIAGNOSIS — D631 Anemia in chronic kidney disease: Secondary | ICD-10-CM | POA: Diagnosis not present

## 2018-12-22 DIAGNOSIS — N2581 Secondary hyperparathyroidism of renal origin: Secondary | ICD-10-CM | POA: Diagnosis not present

## 2018-12-22 DIAGNOSIS — N186 End stage renal disease: Secondary | ICD-10-CM | POA: Diagnosis not present

## 2018-12-22 DIAGNOSIS — D631 Anemia in chronic kidney disease: Secondary | ICD-10-CM | POA: Diagnosis not present

## 2018-12-22 DIAGNOSIS — D509 Iron deficiency anemia, unspecified: Secondary | ICD-10-CM | POA: Diagnosis not present

## 2018-12-24 DIAGNOSIS — D509 Iron deficiency anemia, unspecified: Secondary | ICD-10-CM | POA: Diagnosis not present

## 2018-12-24 DIAGNOSIS — D631 Anemia in chronic kidney disease: Secondary | ICD-10-CM | POA: Diagnosis not present

## 2018-12-24 DIAGNOSIS — N2581 Secondary hyperparathyroidism of renal origin: Secondary | ICD-10-CM | POA: Diagnosis not present

## 2018-12-24 DIAGNOSIS — N186 End stage renal disease: Secondary | ICD-10-CM | POA: Diagnosis not present

## 2018-12-27 DIAGNOSIS — N2581 Secondary hyperparathyroidism of renal origin: Secondary | ICD-10-CM | POA: Diagnosis not present

## 2018-12-27 DIAGNOSIS — I82409 Acute embolism and thrombosis of unspecified deep veins of unspecified lower extremity: Secondary | ICD-10-CM | POA: Diagnosis not present

## 2018-12-27 DIAGNOSIS — N186 End stage renal disease: Secondary | ICD-10-CM | POA: Diagnosis not present

## 2018-12-27 DIAGNOSIS — D509 Iron deficiency anemia, unspecified: Secondary | ICD-10-CM | POA: Diagnosis not present

## 2018-12-27 DIAGNOSIS — D631 Anemia in chronic kidney disease: Secondary | ICD-10-CM | POA: Diagnosis not present

## 2018-12-28 ENCOUNTER — Other Ambulatory Visit: Payer: Self-pay

## 2018-12-28 DIAGNOSIS — Z471 Aftercare following joint replacement surgery: Secondary | ICD-10-CM | POA: Diagnosis not present

## 2018-12-28 DIAGNOSIS — Z96641 Presence of right artificial hip joint: Secondary | ICD-10-CM | POA: Diagnosis not present

## 2018-12-28 NOTE — Patient Outreach (Signed)
[place] [facility]) Care Management  12/28/2018  Joy Anderson 1950/02/10 [id number]   Telephone Assessment    Outreach attempt #1 to patient. No answer at present.      Plan: RN CM will make outreach attempt to patient within 3-4 business days.    Enzo Montgomery, RN,BSN,CCM [place] Management Telephonic Care Management Coordinator Direct Phone: [phone] Toll Free: [phone] Fax: [phone]

## 2018-12-29 ENCOUNTER — Other Ambulatory Visit: Payer: Self-pay

## 2018-12-29 DIAGNOSIS — D509 Iron deficiency anemia, unspecified: Secondary | ICD-10-CM | POA: Diagnosis not present

## 2018-12-29 DIAGNOSIS — D631 Anemia in chronic kidney disease: Secondary | ICD-10-CM | POA: Diagnosis not present

## 2018-12-29 DIAGNOSIS — N186 End stage renal disease: Secondary | ICD-10-CM | POA: Diagnosis not present

## 2018-12-29 DIAGNOSIS — N2581 Secondary hyperparathyroidism of renal origin: Secondary | ICD-10-CM | POA: Diagnosis not present

## 2018-12-29 NOTE — Patient Outreach (Signed)
[place] [facility]) Care Management  12/29/2018  ADALENE GULOTTA 01-18-1950 [id number]   Telephone Assessment    Outreach attempt #2 to patient. No answer at present.      Plan: RN CM will send unsuccessful outreach letter to patient. RN CM will make outreach attempt to patient within 3-4 business days.  Enzo Montgomery, RN,BSN,CCM [place] Management Telephonic Care Management Coordinator Direct Phone: [phone] Toll Free: [phone] Fax: [phone]

## 2019-01-01 DIAGNOSIS — D631 Anemia in chronic kidney disease: Secondary | ICD-10-CM | POA: Diagnosis not present

## 2019-01-01 DIAGNOSIS — D509 Iron deficiency anemia, unspecified: Secondary | ICD-10-CM | POA: Diagnosis not present

## 2019-01-01 DIAGNOSIS — N2581 Secondary hyperparathyroidism of renal origin: Secondary | ICD-10-CM | POA: Diagnosis not present

## 2019-01-01 DIAGNOSIS — N186 End stage renal disease: Secondary | ICD-10-CM | POA: Diagnosis not present

## 2019-01-03 DIAGNOSIS — D631 Anemia in chronic kidney disease: Secondary | ICD-10-CM | POA: Diagnosis not present

## 2019-01-03 DIAGNOSIS — N186 End stage renal disease: Secondary | ICD-10-CM | POA: Diagnosis not present

## 2019-01-03 DIAGNOSIS — I82409 Acute embolism and thrombosis of unspecified deep veins of unspecified lower extremity: Secondary | ICD-10-CM | POA: Diagnosis not present

## 2019-01-03 DIAGNOSIS — N2581 Secondary hyperparathyroidism of renal origin: Secondary | ICD-10-CM | POA: Diagnosis not present

## 2019-01-03 DIAGNOSIS — D509 Iron deficiency anemia, unspecified: Secondary | ICD-10-CM | POA: Diagnosis not present

## 2019-01-04 ENCOUNTER — Other Ambulatory Visit: Payer: Self-pay

## 2019-01-04 NOTE — Patient Outreach (Signed)
[place] [facility]) Care Management  01/04/2019  SHERLIE BOYUM [birth year] [id number]   Telephone Assessment    Outreach attempt # 3 to patient. No answer at present.     Plan: RN CM will make outreach attempt to patient within the month of January.   Enzo Montgomery, RN,BSN,CCM [place] Management Telephonic Care Management Coordinator Direct Phone: [phone] Toll Free: [phone] Fax: [phone]

## 2019-01-05 DIAGNOSIS — D631 Anemia in chronic kidney disease: Secondary | ICD-10-CM | POA: Diagnosis not present

## 2019-01-05 DIAGNOSIS — N186 End stage renal disease: Secondary | ICD-10-CM | POA: Diagnosis not present

## 2019-01-05 DIAGNOSIS — D509 Iron deficiency anemia, unspecified: Secondary | ICD-10-CM | POA: Diagnosis not present

## 2019-01-05 DIAGNOSIS — N2581 Secondary hyperparathyroidism of renal origin: Secondary | ICD-10-CM | POA: Diagnosis not present

## 2019-01-06 DIAGNOSIS — Z992 Dependence on renal dialysis: Secondary | ICD-10-CM | POA: Diagnosis not present

## 2019-01-06 DIAGNOSIS — N186 End stage renal disease: Secondary | ICD-10-CM | POA: Diagnosis not present

## 2019-01-07 DIAGNOSIS — D509 Iron deficiency anemia, unspecified: Secondary | ICD-10-CM | POA: Diagnosis not present

## 2019-01-07 DIAGNOSIS — N186 End stage renal disease: Secondary | ICD-10-CM | POA: Diagnosis not present

## 2019-01-07 DIAGNOSIS — N2581 Secondary hyperparathyroidism of renal origin: Secondary | ICD-10-CM | POA: Diagnosis not present

## 2019-01-07 DIAGNOSIS — D631 Anemia in chronic kidney disease: Secondary | ICD-10-CM | POA: Diagnosis not present

## 2019-01-10 DIAGNOSIS — D509 Iron deficiency anemia, unspecified: Secondary | ICD-10-CM | POA: Diagnosis not present

## 2019-01-10 DIAGNOSIS — D631 Anemia in chronic kidney disease: Secondary | ICD-10-CM | POA: Diagnosis not present

## 2019-01-10 DIAGNOSIS — N186 End stage renal disease: Secondary | ICD-10-CM | POA: Diagnosis not present

## 2019-01-10 DIAGNOSIS — I82409 Acute embolism and thrombosis of unspecified deep veins of unspecified lower extremity: Secondary | ICD-10-CM | POA: Diagnosis not present

## 2019-01-10 DIAGNOSIS — N2581 Secondary hyperparathyroidism of renal origin: Secondary | ICD-10-CM | POA: Diagnosis not present

## 2019-01-12 DIAGNOSIS — D631 Anemia in chronic kidney disease: Secondary | ICD-10-CM | POA: Diagnosis not present

## 2019-01-12 DIAGNOSIS — D509 Iron deficiency anemia, unspecified: Secondary | ICD-10-CM | POA: Diagnosis not present

## 2019-01-12 DIAGNOSIS — E1139 Type 2 diabetes mellitus with other diabetic ophthalmic complication: Secondary | ICD-10-CM | POA: Diagnosis not present

## 2019-01-12 DIAGNOSIS — N186 End stage renal disease: Secondary | ICD-10-CM | POA: Diagnosis not present

## 2019-01-12 DIAGNOSIS — N2581 Secondary hyperparathyroidism of renal origin: Secondary | ICD-10-CM | POA: Diagnosis not present

## 2019-01-14 DIAGNOSIS — D509 Iron deficiency anemia, unspecified: Secondary | ICD-10-CM | POA: Diagnosis not present

## 2019-01-14 DIAGNOSIS — N2581 Secondary hyperparathyroidism of renal origin: Secondary | ICD-10-CM | POA: Diagnosis not present

## 2019-01-14 DIAGNOSIS — N186 End stage renal disease: Secondary | ICD-10-CM | POA: Diagnosis not present

## 2019-01-14 DIAGNOSIS — D631 Anemia in chronic kidney disease: Secondary | ICD-10-CM | POA: Diagnosis not present

## 2019-01-17 DIAGNOSIS — I82409 Acute embolism and thrombosis of unspecified deep veins of unspecified lower extremity: Secondary | ICD-10-CM | POA: Diagnosis not present

## 2019-01-17 DIAGNOSIS — D509 Iron deficiency anemia, unspecified: Secondary | ICD-10-CM | POA: Diagnosis not present

## 2019-01-17 DIAGNOSIS — N2581 Secondary hyperparathyroidism of renal origin: Secondary | ICD-10-CM | POA: Diagnosis not present

## 2019-01-17 DIAGNOSIS — N186 End stage renal disease: Secondary | ICD-10-CM | POA: Diagnosis not present

## 2019-01-19 DIAGNOSIS — N186 End stage renal disease: Secondary | ICD-10-CM | POA: Diagnosis not present

## 2019-01-19 DIAGNOSIS — N2581 Secondary hyperparathyroidism of renal origin: Secondary | ICD-10-CM | POA: Diagnosis not present

## 2019-01-19 DIAGNOSIS — D509 Iron deficiency anemia, unspecified: Secondary | ICD-10-CM | POA: Diagnosis not present

## 2019-01-21 DIAGNOSIS — D509 Iron deficiency anemia, unspecified: Secondary | ICD-10-CM | POA: Diagnosis not present

## 2019-01-21 DIAGNOSIS — N2581 Secondary hyperparathyroidism of renal origin: Secondary | ICD-10-CM | POA: Diagnosis not present

## 2019-01-21 DIAGNOSIS — N186 End stage renal disease: Secondary | ICD-10-CM | POA: Diagnosis not present

## 2019-01-24 DIAGNOSIS — N186 End stage renal disease: Secondary | ICD-10-CM | POA: Diagnosis not present

## 2019-01-24 DIAGNOSIS — D509 Iron deficiency anemia, unspecified: Secondary | ICD-10-CM | POA: Diagnosis not present

## 2019-01-24 DIAGNOSIS — I82409 Acute embolism and thrombosis of unspecified deep veins of unspecified lower extremity: Secondary | ICD-10-CM | POA: Diagnosis not present

## 2019-01-24 DIAGNOSIS — N2581 Secondary hyperparathyroidism of renal origin: Secondary | ICD-10-CM | POA: Diagnosis not present

## 2019-01-26 DIAGNOSIS — N2581 Secondary hyperparathyroidism of renal origin: Secondary | ICD-10-CM | POA: Diagnosis not present

## 2019-01-26 DIAGNOSIS — D509 Iron deficiency anemia, unspecified: Secondary | ICD-10-CM | POA: Diagnosis not present

## 2019-01-26 DIAGNOSIS — Z1159 Encounter for screening for other viral diseases: Secondary | ICD-10-CM | POA: Diagnosis not present

## 2019-01-26 DIAGNOSIS — Z114 Encounter for screening for human immunodeficiency virus [HIV]: Secondary | ICD-10-CM | POA: Diagnosis not present

## 2019-01-26 DIAGNOSIS — N186 End stage renal disease: Secondary | ICD-10-CM | POA: Diagnosis not present

## 2019-01-28 DIAGNOSIS — N186 End stage renal disease: Secondary | ICD-10-CM | POA: Diagnosis not present

## 2019-01-28 DIAGNOSIS — D631 Anemia in chronic kidney disease: Secondary | ICD-10-CM | POA: Diagnosis not present

## 2019-01-28 DIAGNOSIS — D509 Iron deficiency anemia, unspecified: Secondary | ICD-10-CM | POA: Diagnosis not present

## 2019-01-31 DIAGNOSIS — I82409 Acute embolism and thrombosis of unspecified deep veins of unspecified lower extremity: Secondary | ICD-10-CM | POA: Diagnosis not present

## 2019-01-31 DIAGNOSIS — D509 Iron deficiency anemia, unspecified: Secondary | ICD-10-CM | POA: Diagnosis not present

## 2019-01-31 DIAGNOSIS — N186 End stage renal disease: Secondary | ICD-10-CM | POA: Diagnosis not present

## 2019-01-31 DIAGNOSIS — D631 Anemia in chronic kidney disease: Secondary | ICD-10-CM | POA: Diagnosis not present

## 2019-02-01 ENCOUNTER — Other Ambulatory Visit: Payer: Self-pay

## 2019-02-01 NOTE — Patient Outreach (Signed)
[place] [facility]) Care Management  02/01/2019  MAKYNLEIGH BRESLIN 09/05/1950 [id number]   Telephone Assessment    Outreach attempt to patient. No answer. RN CM left HIPAA compliant voicemail message along with contact info.      Plan: RN CM will make outreach attempt to patient within the month of February.   Enzo Montgomery, RN,BSN,CCM [place] Management Telephonic Care Management Coordinator Direct Phone: [phone] Toll Free: [phone] Fax: [phone]

## 2019-02-02 ENCOUNTER — Ambulatory Visit: Payer: Self-pay

## 2019-02-02 DIAGNOSIS — D509 Iron deficiency anemia, unspecified: Secondary | ICD-10-CM | POA: Diagnosis not present

## 2019-02-02 DIAGNOSIS — N186 End stage renal disease: Secondary | ICD-10-CM | POA: Diagnosis not present

## 2019-02-02 DIAGNOSIS — D631 Anemia in chronic kidney disease: Secondary | ICD-10-CM | POA: Diagnosis not present

## 2019-02-04 DIAGNOSIS — D509 Iron deficiency anemia, unspecified: Secondary | ICD-10-CM | POA: Diagnosis not present

## 2019-02-04 DIAGNOSIS — D631 Anemia in chronic kidney disease: Secondary | ICD-10-CM | POA: Diagnosis not present

## 2019-02-04 DIAGNOSIS — N186 End stage renal disease: Secondary | ICD-10-CM | POA: Diagnosis not present

## 2019-02-06 DIAGNOSIS — Z992 Dependence on renal dialysis: Secondary | ICD-10-CM | POA: Diagnosis not present

## 2019-02-06 DIAGNOSIS — N186 End stage renal disease: Secondary | ICD-10-CM | POA: Diagnosis not present

## 2019-02-07 DIAGNOSIS — D631 Anemia in chronic kidney disease: Secondary | ICD-10-CM | POA: Diagnosis not present

## 2019-02-07 DIAGNOSIS — N2581 Secondary hyperparathyroidism of renal origin: Secondary | ICD-10-CM | POA: Diagnosis not present

## 2019-02-07 DIAGNOSIS — D509 Iron deficiency anemia, unspecified: Secondary | ICD-10-CM | POA: Diagnosis not present

## 2019-02-07 DIAGNOSIS — N186 End stage renal disease: Secondary | ICD-10-CM | POA: Diagnosis not present

## 2019-02-07 DIAGNOSIS — I82409 Acute embolism and thrombosis of unspecified deep veins of unspecified lower extremity: Secondary | ICD-10-CM | POA: Diagnosis not present

## 2019-02-09 DIAGNOSIS — E119 Type 2 diabetes mellitus without complications: Secondary | ICD-10-CM | POA: Diagnosis not present

## 2019-02-09 DIAGNOSIS — D509 Iron deficiency anemia, unspecified: Secondary | ICD-10-CM | POA: Diagnosis not present

## 2019-02-09 DIAGNOSIS — N2581 Secondary hyperparathyroidism of renal origin: Secondary | ICD-10-CM | POA: Diagnosis not present

## 2019-02-09 DIAGNOSIS — N186 End stage renal disease: Secondary | ICD-10-CM | POA: Diagnosis not present

## 2019-02-09 DIAGNOSIS — D631 Anemia in chronic kidney disease: Secondary | ICD-10-CM | POA: Diagnosis not present

## 2019-02-11 DIAGNOSIS — D509 Iron deficiency anemia, unspecified: Secondary | ICD-10-CM | POA: Diagnosis not present

## 2019-02-11 DIAGNOSIS — N186 End stage renal disease: Secondary | ICD-10-CM | POA: Diagnosis not present

## 2019-02-11 DIAGNOSIS — N2581 Secondary hyperparathyroidism of renal origin: Secondary | ICD-10-CM | POA: Diagnosis not present

## 2019-02-11 DIAGNOSIS — D631 Anemia in chronic kidney disease: Secondary | ICD-10-CM | POA: Diagnosis not present

## 2019-02-14 DIAGNOSIS — N2581 Secondary hyperparathyroidism of renal origin: Secondary | ICD-10-CM | POA: Diagnosis not present

## 2019-02-14 DIAGNOSIS — I82409 Acute embolism and thrombosis of unspecified deep veins of unspecified lower extremity: Secondary | ICD-10-CM | POA: Diagnosis not present

## 2019-02-14 DIAGNOSIS — D631 Anemia in chronic kidney disease: Secondary | ICD-10-CM | POA: Diagnosis not present

## 2019-02-14 DIAGNOSIS — N186 End stage renal disease: Secondary | ICD-10-CM | POA: Diagnosis not present

## 2019-02-14 DIAGNOSIS — D509 Iron deficiency anemia, unspecified: Secondary | ICD-10-CM | POA: Diagnosis not present

## 2019-02-16 DIAGNOSIS — D509 Iron deficiency anemia, unspecified: Secondary | ICD-10-CM | POA: Diagnosis not present

## 2019-02-16 DIAGNOSIS — D631 Anemia in chronic kidney disease: Secondary | ICD-10-CM | POA: Diagnosis not present

## 2019-02-16 DIAGNOSIS — N2581 Secondary hyperparathyroidism of renal origin: Secondary | ICD-10-CM | POA: Diagnosis not present

## 2019-02-16 DIAGNOSIS — N186 End stage renal disease: Secondary | ICD-10-CM | POA: Diagnosis not present

## 2019-02-18 DIAGNOSIS — N2581 Secondary hyperparathyroidism of renal origin: Secondary | ICD-10-CM | POA: Diagnosis not present

## 2019-02-18 DIAGNOSIS — Z23 Encounter for immunization: Secondary | ICD-10-CM | POA: Diagnosis not present

## 2019-02-18 DIAGNOSIS — N186 End stage renal disease: Secondary | ICD-10-CM | POA: Diagnosis not present

## 2019-02-21 DIAGNOSIS — Z23 Encounter for immunization: Secondary | ICD-10-CM | POA: Diagnosis not present

## 2019-02-21 DIAGNOSIS — N2581 Secondary hyperparathyroidism of renal origin: Secondary | ICD-10-CM | POA: Diagnosis not present

## 2019-02-21 DIAGNOSIS — I82409 Acute embolism and thrombosis of unspecified deep veins of unspecified lower extremity: Secondary | ICD-10-CM | POA: Diagnosis not present

## 2019-02-21 DIAGNOSIS — N186 End stage renal disease: Secondary | ICD-10-CM | POA: Diagnosis not present

## 2019-02-23 DIAGNOSIS — N2581 Secondary hyperparathyroidism of renal origin: Secondary | ICD-10-CM | POA: Diagnosis not present

## 2019-02-23 DIAGNOSIS — Z23 Encounter for immunization: Secondary | ICD-10-CM | POA: Diagnosis not present

## 2019-02-23 DIAGNOSIS — N186 End stage renal disease: Secondary | ICD-10-CM | POA: Diagnosis not present

## 2019-02-24 ENCOUNTER — Ambulatory Visit: Payer: Medicare HMO | Admitting: Family Medicine

## 2019-02-25 ENCOUNTER — Telehealth: Payer: Self-pay | Admitting: Family Medicine

## 2019-02-25 DIAGNOSIS — N2581 Secondary hyperparathyroidism of renal origin: Secondary | ICD-10-CM | POA: Diagnosis not present

## 2019-02-25 DIAGNOSIS — Z23 Encounter for immunization: Secondary | ICD-10-CM | POA: Diagnosis not present

## 2019-02-25 DIAGNOSIS — N186 End stage renal disease: Secondary | ICD-10-CM | POA: Diagnosis not present

## 2019-02-25 NOTE — Telephone Encounter (Signed)
Please call patient and see if she ever returned her Cologuard kit.  We never received her results and will just try to follow-up on that.  Also please see if she had her flu shot this past fall.  Is also been 6 months since her last visit so it is about time to get scheduled that sometime this month.

## 2019-02-28 DIAGNOSIS — D631 Anemia in chronic kidney disease: Secondary | ICD-10-CM | POA: Diagnosis not present

## 2019-02-28 DIAGNOSIS — I82409 Acute embolism and thrombosis of unspecified deep veins of unspecified lower extremity: Secondary | ICD-10-CM | POA: Diagnosis not present

## 2019-02-28 DIAGNOSIS — Z23 Encounter for immunization: Secondary | ICD-10-CM | POA: Diagnosis not present

## 2019-02-28 DIAGNOSIS — N186 End stage renal disease: Secondary | ICD-10-CM | POA: Diagnosis not present

## 2019-02-28 DIAGNOSIS — N2581 Secondary hyperparathyroidism of renal origin: Secondary | ICD-10-CM | POA: Diagnosis not present

## 2019-02-28 NOTE — Telephone Encounter (Signed)
Pt has not sent the kit back in. She has a f/u on 03/03/2019.Marland KitchenMarland KitchenElouise Munroe, [place]

## 2019-03-01 ENCOUNTER — Other Ambulatory Visit: Payer: Self-pay

## 2019-03-01 NOTE — Patient Outreach (Addendum)
[facility]) Care Management  03/01/2019  Joy Anderson 07-Oct-1950 [id number]   Telephone Assessment   Outreach attempt to patient. Spoke with patient who denies any acute issues or concerns at present. 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 Appetite remains WNL for patient wgt loss/gain. Patient reports no recent med changes. She has PCP appt on 03/03/2019. Patient reports that MD wanted her to do cologuard testing but she was unable to provide a sample. She will discuss further with MD at appt. She denies any RN CM needs or concerns at present.   Medications Reviewed Today    Reviewed by Hayden Pedro, RN (Registered Nurse) on 03/01/19 at [phone]  Med List Status: <None>  Medication Order Taking? Sig Documenting Provider Last Dose Status Informant  acetaminophen (TYLENOL) 500 MG tablet [id number] No Take 500 mg by mouth every 6 (six) hours as needed. Two tablets [provider] Taking Active Self  amLODipine (NORVASC) 5 MG tablet [id number] No Take 5 mg by mouth every evening. [provider] Taking Active   atorvastatin (LIPITOR) 20 MG tablet [id number] No Take 1 tablet (20 mg total) by mouth at bedtime. Due for follow up visit Hali Marry, MD Taking Active   CALCIUM PO [id number] No Take by mouth. [provider] Taking Active   cholecalciferol (VITAMIN D) 1000 units tablet [id number] No Take 1,000 Units by mouth daily. [provider] Not Taking Active   cinacalcet (SENSIPAR) 30 MG tablet [id number] No Take 2 tablets by mouth daily. [provider] Taking Active   HYDROcodone-acetaminophen (NORCO/VICODIN) 5-325 MG tablet [id number] No Take 1 tablet by mouth every 4 (four) hours as needed for moderate pain. [provider] Taking Active Self  hydrocortisone 2.5 % cream [id number] No Apply 1 application topically 2  (two) times daily as needed. Hali Marry, MD Taking Active   hydroxychloroquine (PLAQUENIL) 200 MG tablet [id number] No Take 200 mg by mouth 2 (two) times daily.   [provider] Taking Active   losartan (COZAAR) 100 MG tablet [id number] No Take 100 mg by mouth daily. [provider] Taking Active   multivitamin (RENA-VIT) TABS tablet [id number] No  [provider] Taking Active            Med Note Maryruth Eve, Leanna Battles   Thu Aug 16, 2015  1:22 PM) Received from: External Pharmacy  warfarin (COUMADIN) 1 MG tablet [id number] No  [provider] Not Taking Active            Med Note Teddy Spike   Thu Aug 16, 2015  1:22 PM) Received from: External Pharmacy  warfarin (COUMADIN) 4 MG tablet [id number] No Take by mouth. [provider] Not Taking Active   warfarin (COUMADIN) 5 MG tablet [id number] No TAKE 1 TABLET BY MOUTH ON SUNDAY, WENESDAY, THURSDAY, FRIDAY, AND SATURDAY. THEN TAKE 1 AND 1/2 TABLET ON MONDAY AND TUESDAY  Patient taking differently: Patient states taking 2.'5mg'$ (half tablet) daily   Hali Marry, MD Taking Active Self         [facility] CM Care Plan Problem One     Most Recent Value  Care Plan Problem One  Patient at risk for hospital readmission due to co-morbidities.  Role Documenting the Problem One  Care Management Telephonic [place] for Problem One  Active  [facility] Long Term Goal   Patient will be able to  tolerate HD txs without any complications over the next 90 days.  [facility] Long Term Goal Start Date  03/01/19  [facility] Long Term Goal Met Date  03/01/19  [facility] for Problem One Long Term Goal  RN CM assessed for any complicaitons/SWEs from txs. RN CM reviewed action plan with patient and when to seek medical attention.  [facility] CM Short Term Goal #1   patient will complete all post discharge MD follow up appts over the next 30 days.  [facility] CM Short Term Goal #1 Start Date  11/15/18  [facility] CM Short Term Goal #1 Met  Date  03/01/19  [facility] CM Short Term Goal #2   Patient will report that surgical site heals without s/s of infection or complications within the next 30 days.  [facility] CM Short Term Goal #2 Start Date  11/15/18  [facility] CM Short Term Goal #2 Met Date  03/01/19  [facility] CM Short Term Goal #3  Patient will actively participate and engage with HHPT to improve mobility and strength. over the next 30 days.  [facility] CM Short Term Goal #3 Start Date  11/15/18  [facility] CM Short Term Goal #3 Met Date  03/01/19      Plan: RN CM discussed with patient next outreach within the month of April. Patient gave verbal consent and in agreement with RN CM follow up and timeframe. Patient aware that they may contact RN CM sooner for any issues or concerns. RN CM will send quarterly update to PCP.  Enzo Montgomery, RN,BSN,CCM [place] Management Telephonic Care Management Coordinator Direct Phone: [phone] Toll Free: [phone] Fax: [phone]

## 2019-03-02 DIAGNOSIS — N2581 Secondary hyperparathyroidism of renal origin: Secondary | ICD-10-CM | POA: Diagnosis not present

## 2019-03-02 DIAGNOSIS — Z23 Encounter for immunization: Secondary | ICD-10-CM | POA: Diagnosis not present

## 2019-03-02 DIAGNOSIS — D631 Anemia in chronic kidney disease: Secondary | ICD-10-CM | POA: Diagnosis not present

## 2019-03-02 DIAGNOSIS — N186 End stage renal disease: Secondary | ICD-10-CM | POA: Diagnosis not present

## 2019-03-03 ENCOUNTER — Ambulatory Visit (INDEPENDENT_AMBULATORY_CARE_PROVIDER_SITE_OTHER): Payer: Medicare HMO | Admitting: Family Medicine

## 2019-03-03 ENCOUNTER — Encounter: Payer: Self-pay | Admitting: Family Medicine

## 2019-03-03 ENCOUNTER — Other Ambulatory Visit: Payer: Self-pay

## 2019-03-03 VITALS — BP 108/60 | HR 80 | Ht 62.0 in | Wt 145.0 lb

## 2019-03-03 DIAGNOSIS — I10 Essential (primary) hypertension: Secondary | ICD-10-CM | POA: Diagnosis not present

## 2019-03-03 DIAGNOSIS — K6289 Other specified diseases of anus and rectum: Secondary | ICD-10-CM

## 2019-03-03 DIAGNOSIS — R0781 Pleurodynia: Secondary | ICD-10-CM | POA: Diagnosis not present

## 2019-03-03 DIAGNOSIS — R109 Unspecified abdominal pain: Secondary | ICD-10-CM

## 2019-03-03 DIAGNOSIS — K649 Unspecified hemorrhoids: Secondary | ICD-10-CM

## 2019-03-03 LAB — POC HEMOCCULT BLD/STL (OFFICE/1-CARD/DIAGNOSTIC): Fecal Occult Blood, POC: NEGATIVE

## 2019-03-03 MED ORDER — AMBULATORY NON FORMULARY MEDICATION: 0 refills | Status: DC

## 2019-03-03 MED ORDER — HYDROCORTISONE 2.5 % EX CREA: 1.0000 "application " | TOPICAL_CREAM | Freq: Two times a day (BID) | CUTANEOUS | 1 refills | Status: DC | PRN

## 2019-03-03 NOTE — Patient Instructions (Signed)
Call me if the hydrocortisone doesn't help and can refer you to the colorectal surgeon for other treatment options.

## 2019-03-03 NOTE — Progress Notes (Signed)
Established Patient Office Visit  Subjective:  Patient ID: Joy Anderson, female    DOB: [birth year]  Age: 69 y.o. MRN: [id number]  CC:  Chief Complaint  Patient presents with  . Hypertension    HPI [name] presents for   Hypertension- Pt denies chest pain, SOB, dizziness, or heart palpitations.  Taking meds as directed w/o problems.  Denies medication side effects.    She still continues to notice a fullness in the rectal area particularly with bowel movements.  She says the area "blows up".  She often has frequent loose stools and she says usually up by about the third bowel movement everything just feels really tight and swollen.  She is not noticed any blood in the stool.  She is tried Tucks, lidocaine, and Preparation H without significant improvement.  She also wanted to ask about the shingles vaccine.  She had an outbreak of shingles previously and says sometimes she gets the sensation or tingling in the area where she had it before almost like it wants to come back.  She also complains of right-sided pain most over the flank towards the rib area.  She says its a little bit more bothersome with movement.  It started about a week ago.  She denies any known injury or trauma to the area.  No cough or shortness of breath.  It pretty much stays in the same area.  No rash.    Past Medical History:  Diagnosis Date  . Antiphospholipid antibody with hypercoagulable state ([place]) 08/27/2010  . DVT (deep venous thrombosis) ([facility])    anti phospholipid antibody + -- Dr Lewanda Rife  . Hypertension   . OAB (overactive bladder)    off meds  . Post-menopausal   . Proteinuria    Dr Tressie Ellis  . Raynaud's disease     Past Surgical History:  Procedure Laterality Date  . AV FISTULA PLACEMENT  2016  . CATARACT EXTRACTION, BILATERAL  2017  . TOTAL ABDOMINAL HYSTERECTOMY  2001   for fibroids w/ 1 oophorectomy    Family History  Problem Relation Age of Onset  . Alcohol abuse Father    . Colon cancer Brother   . Prostate cancer Brother   . Cerebral palsy Brother   . Diabetes Other     Social History   Socioeconomic History  . Marital status: Divorced    Spouse name: Not on file  . Number of children: Not on file  . Years of education: Not on file  . Highest education level: Not on file  Occupational History  . Not on file  Tobacco Use  . Smoking status: Never Smoker  . Smokeless tobacco: Never Used  Substance and Sexual Activity  . Alcohol use: Yes    Comment: occasional  . Drug use: No  . Sexual activity: Not on file  Other Topics Concern  . Not on file  Social History Narrative  . Not on file   Social Determinants of Health   Financial Resource Strain:   . Difficulty of Paying Living Expenses: Not on file  Food Insecurity: No Food Insecurity  . Worried About Charity fundraiser in the Last Year: Never true  . Ran Out of Food in the Last Year: Never true  Transportation Needs: Unmet Transportation Needs  . Lack of Transportation (Medical): Yes  . Lack of Transportation (Non-Medical): Yes  Physical Activity:   . Days of Exercise per Week: Not on file  . Minutes of Exercise per  Session: Not on file  Stress:   . Feeling of Stress : Not on file  Social Connections:   . Frequency of Communication with Friends and Family: Not on file  . Frequency of Social Gatherings with Friends and Family: Not on file  . Attends Religious Services: Not on file  . Active Member of Clubs or Organizations: Not on file  . Attends Archivist Meetings: Not on file  . Marital Status: Not on file  Intimate Partner Violence:   . Fear of Current or Ex-Partner: Not on file  . Emotionally Abused: Not on file  . Physically Abused: Not on file  . Sexually Abused: Not on file    Outpatient Medications Prior to Visit  Medication Sig Dispense Refill  . acetaminophen (TYLENOL) 500 MG tablet Take 500 mg by mouth every 6 (six) hours as needed. Two tablets    .  amLODipine (NORVASC) 5 MG tablet Take 5 mg by mouth every evening.    Marland Kitchen atorvastatin (LIPITOR) 20 MG tablet Take 1 tablet (20 mg total) by mouth at bedtime. Due for follow up visit 90 tablet 3  . CALCIUM PO Take by mouth.    . cinacalcet (SENSIPAR) 30 MG tablet Take 2 tablets by mouth daily.    Marland Kitchen HYDROcodone-acetaminophen (NORCO/VICODIN) 5-325 MG tablet Take 1 tablet by mouth every 4 (four) hours as needed for moderate pain.    . hydroxychloroquine (PLAQUENIL) 200 MG tablet Take 200 mg by mouth 2 (two) times daily.      Marland Kitchen losartan (COZAAR) 100 MG tablet Take 100 mg by mouth daily.    . multivitamin (RENA-VIT) TABS tablet     . warfarin (COUMADIN) 1 MG tablet     . warfarin (COUMADIN) 4 MG tablet Take by mouth.    . warfarin (COUMADIN) 5 MG tablet TAKE 1 TABLET BY MOUTH ON SUNDAY, WENESDAY, THURSDAY, FRIDAY, AND SATURDAY. THEN TAKE 1 AND 1/2 TABLET ON MONDAY AND TUESDAY (Patient taking differently: Patient states taking 2.5mg (half tablet) daily) 97 tablet 1  . hydrocortisone 2.5 % cream Apply 1 application topically 2 (two) times daily as needed. 45 g 1  . cholecalciferol (VITAMIN D) 1000 units tablet Take 1,000 Units by mouth daily.     No facility-administered medications prior to visit.    Allergies  Allergen Reactions  . Ace Inhibitors     REACTION: cough Other reaction(s): Other (See Comments) REACTION: cough  . Metformin     Other reaction(s): Other (See Comments) Nausea, decreased appetite Nausea, decreased appetite  . Fish Oil Other (See Comments)    Other reaction(s): Other (See Comments)  . Metformin And Related Other (See Comments)    Nausea, decreased appetite    ROS Review of Systems    Objective:    Physical Exam  Constitutional: She is oriented to person, place, and time. She appears well-developed and well-nourished.  HENT:  Head: Normocephalic and atraumatic.  Eyes: Conjunctivae and EOM are normal.  Cardiovascular: Normal rate.  Pulmonary/Chest: Effort  normal.  Genitourinary:    Genitourinary Comments: rectum with Normal tone. She has bulging tissue on the left side of the rectal area.  No significant swelling or thrombosis.  No rectal mass on digital exam.    Musculoskeletal:     Comments: Tender to palpation over her right lateral rib area.  She also had some pain with turning and twisting.  I did not feel any popping or movement.  Neurological: She is alert and oriented to person, place, and  time.  Skin: Skin is dry. No pallor.  Incision over right hip well-healed.  Psychiatric: She has a normal mood and affect. Her behavior is normal.  Vitals reviewed.   BP 108/60   Pulse 80   Ht 5\' 2"  (1.575 m)   Wt 145 lb (65.8 kg)   LMP 03/18/1999   SpO2 94%   BMI 26.52 kg/m  Wt Readings from Last 3 Encounters:  03/03/19 145 lb (65.8 kg)  08/24/18 153 lb (69.4 kg)  02/23/18 153 lb (69.4 kg)     Health Maintenance Due  Topic Date Due  . DEXA SCAN  09/18/2017  . COLONOSCOPY  04/15/2018    There are no preventive care reminders to display for this patient.  Lab Results  Component Value Date   TSH 2.357 04/25/2014   Lab Results  Component Value Date   WBC 7.5 09/09/2017   HGB 10.0 (A) 09/09/2017   HCT 30 (A) 09/09/2017   MCV 83.0 04/25/2014   PLT 230 09/09/2017   Lab Results  Component Value Date   NA 142 09/02/2018   K 4.2 09/02/2018   CO2 30 09/02/2018   GLUCOSE 90 09/02/2018   BUN 18 09/02/2018   CREATININE 7.79 (H) 09/02/2018   BILITOT 0.4 09/02/2018   ALKPHOS 78 09/09/2017   AST 14 09/02/2018   ALT 15 09/02/2018   PROT 7.5 09/02/2018   ALBUMIN 4.5 08/06/2016   ALBUMIN 4.5 08/06/2016   CALCIUM 9.7 09/02/2018   Lab Results  Component Value Date   CHOL 131 09/02/2018   Lab Results  Component Value Date   HDL 34 (L) 09/02/2018   Lab Results  Component Value Date   LDLCALC 69 09/02/2018   Lab Results  Component Value Date   TRIG 227 (H) 09/02/2018   Lab Results  Component Value Date   CHOLHDL 3.9  09/02/2018   Lab Results  Component Value Date   HGBA1C 5.2 09/01/2017      Assessment & Plan:   Problem List Items Addressed This Visit      Cardiovascular and Mediastinum   ESSENTIAL HYPERTENSION, BENIGN - Primary    Well controlled. Continue current regimen. Follow up in  6 mo       Other Visit Diagnoses    Hemorrhoids, unspecified hemorrhoid type       Relevant Orders   POC Hemoccult Bld/Stl (1-Cd Office Dx) (Completed)   Right sided abdominal pain       Rectal pain       Rib pain       Relevant Orders   DG Ribs Unilateral W/Chest Right     Right rib pain -unclear etiology.  I am concerned that she could have actually cracked rib though she does not remember any injury or trauma and she is not been coughing.  But she is tender to touch.  She says she has not noticed a rash in that area.  We will get x-rays for further work-up.  Will call with results once available.  Rectal pain-looks consistent with external hemorrhoid.  No rectal mass.  No acute swelling or inflammation.  Recommend a trial of the topical hydrocortisone 2.5% at home.  Use as needed.  If not helping then recommend consultation with colorectal surgeon.   Meds ordered this encounter  Medications  . hydrocortisone 2.5 % cream    Sig: Apply 1 application topically 2 (two) times daily as needed.    Dispense:  45 g    Refill:  1  .  AMBULATORY NON FORMULARY MEDICATION    Sig: Medication Name: Shingrix IM x 1. Then repeat in 2-6 months.    Dispense:  1 vial    Refill:  0    Follow-up: Return in 6 months (on 08/31/2019), or if symptoms worsen or fail to improve, for bp.    [name], MD

## 2019-03-03 NOTE — Assessment & Plan Note (Signed)
Well controlled. Continue current regimen. Follow up in  6 mo  

## 2019-03-04 DIAGNOSIS — N2581 Secondary hyperparathyroidism of renal origin: Secondary | ICD-10-CM | POA: Diagnosis not present

## 2019-03-04 DIAGNOSIS — D631 Anemia in chronic kidney disease: Secondary | ICD-10-CM | POA: Diagnosis not present

## 2019-03-04 DIAGNOSIS — N186 End stage renal disease: Secondary | ICD-10-CM | POA: Diagnosis not present

## 2019-03-04 DIAGNOSIS — Z23 Encounter for immunization: Secondary | ICD-10-CM | POA: Diagnosis not present

## 2019-03-06 DIAGNOSIS — Z992 Dependence on renal dialysis: Secondary | ICD-10-CM | POA: Diagnosis not present

## 2019-03-06 DIAGNOSIS — N186 End stage renal disease: Secondary | ICD-10-CM | POA: Diagnosis not present

## 2019-03-07 DIAGNOSIS — N2581 Secondary hyperparathyroidism of renal origin: Secondary | ICD-10-CM | POA: Diagnosis not present

## 2019-03-07 DIAGNOSIS — Z23 Encounter for immunization: Secondary | ICD-10-CM | POA: Diagnosis not present

## 2019-03-07 DIAGNOSIS — I82409 Acute embolism and thrombosis of unspecified deep veins of unspecified lower extremity: Secondary | ICD-10-CM | POA: Diagnosis not present

## 2019-03-07 DIAGNOSIS — D631 Anemia in chronic kidney disease: Secondary | ICD-10-CM | POA: Diagnosis not present

## 2019-03-07 DIAGNOSIS — N186 End stage renal disease: Secondary | ICD-10-CM | POA: Diagnosis not present

## 2019-03-07 DIAGNOSIS — D509 Iron deficiency anemia, unspecified: Secondary | ICD-10-CM | POA: Diagnosis not present

## 2019-03-09 DIAGNOSIS — N186 End stage renal disease: Secondary | ICD-10-CM | POA: Diagnosis not present

## 2019-03-09 DIAGNOSIS — N2581 Secondary hyperparathyroidism of renal origin: Secondary | ICD-10-CM | POA: Diagnosis not present

## 2019-03-09 DIAGNOSIS — E119 Type 2 diabetes mellitus without complications: Secondary | ICD-10-CM | POA: Diagnosis not present

## 2019-03-09 DIAGNOSIS — D509 Iron deficiency anemia, unspecified: Secondary | ICD-10-CM | POA: Diagnosis not present

## 2019-03-09 DIAGNOSIS — Z23 Encounter for immunization: Secondary | ICD-10-CM | POA: Diagnosis not present

## 2019-03-09 DIAGNOSIS — D631 Anemia in chronic kidney disease: Secondary | ICD-10-CM | POA: Diagnosis not present

## 2019-03-11 DIAGNOSIS — Z23 Encounter for immunization: Secondary | ICD-10-CM | POA: Diagnosis not present

## 2019-03-11 DIAGNOSIS — D631 Anemia in chronic kidney disease: Secondary | ICD-10-CM | POA: Diagnosis not present

## 2019-03-11 DIAGNOSIS — N2581 Secondary hyperparathyroidism of renal origin: Secondary | ICD-10-CM | POA: Diagnosis not present

## 2019-03-11 DIAGNOSIS — D509 Iron deficiency anemia, unspecified: Secondary | ICD-10-CM | POA: Diagnosis not present

## 2019-03-11 DIAGNOSIS — N186 End stage renal disease: Secondary | ICD-10-CM | POA: Diagnosis not present

## 2019-03-14 DIAGNOSIS — Z23 Encounter for immunization: Secondary | ICD-10-CM | POA: Diagnosis not present

## 2019-03-14 DIAGNOSIS — D631 Anemia in chronic kidney disease: Secondary | ICD-10-CM | POA: Diagnosis not present

## 2019-03-14 DIAGNOSIS — N2581 Secondary hyperparathyroidism of renal origin: Secondary | ICD-10-CM | POA: Diagnosis not present

## 2019-03-14 DIAGNOSIS — I82409 Acute embolism and thrombosis of unspecified deep veins of unspecified lower extremity: Secondary | ICD-10-CM | POA: Diagnosis not present

## 2019-03-14 DIAGNOSIS — D509 Iron deficiency anemia, unspecified: Secondary | ICD-10-CM | POA: Diagnosis not present

## 2019-03-14 DIAGNOSIS — N186 End stage renal disease: Secondary | ICD-10-CM | POA: Diagnosis not present

## 2019-03-16 DIAGNOSIS — D509 Iron deficiency anemia, unspecified: Secondary | ICD-10-CM | POA: Diagnosis not present

## 2019-03-16 DIAGNOSIS — Z23 Encounter for immunization: Secondary | ICD-10-CM | POA: Diagnosis not present

## 2019-03-16 DIAGNOSIS — N2581 Secondary hyperparathyroidism of renal origin: Secondary | ICD-10-CM | POA: Diagnosis not present

## 2019-03-16 DIAGNOSIS — D631 Anemia in chronic kidney disease: Secondary | ICD-10-CM | POA: Diagnosis not present

## 2019-03-16 DIAGNOSIS — N186 End stage renal disease: Secondary | ICD-10-CM | POA: Diagnosis not present

## 2019-03-18 DIAGNOSIS — D631 Anemia in chronic kidney disease: Secondary | ICD-10-CM | POA: Diagnosis not present

## 2019-03-18 DIAGNOSIS — N186 End stage renal disease: Secondary | ICD-10-CM | POA: Diagnosis not present

## 2019-03-18 DIAGNOSIS — N2581 Secondary hyperparathyroidism of renal origin: Secondary | ICD-10-CM | POA: Diagnosis not present

## 2019-03-18 DIAGNOSIS — Z23 Encounter for immunization: Secondary | ICD-10-CM | POA: Diagnosis not present

## 2019-03-21 DIAGNOSIS — D631 Anemia in chronic kidney disease: Secondary | ICD-10-CM | POA: Diagnosis not present

## 2019-03-21 DIAGNOSIS — N186 End stage renal disease: Secondary | ICD-10-CM | POA: Diagnosis not present

## 2019-03-21 DIAGNOSIS — Z23 Encounter for immunization: Secondary | ICD-10-CM | POA: Diagnosis not present

## 2019-03-21 DIAGNOSIS — I82409 Acute embolism and thrombosis of unspecified deep veins of unspecified lower extremity: Secondary | ICD-10-CM | POA: Diagnosis not present

## 2019-03-21 DIAGNOSIS — N2581 Secondary hyperparathyroidism of renal origin: Secondary | ICD-10-CM | POA: Diagnosis not present

## 2019-03-23 DIAGNOSIS — Z23 Encounter for immunization: Secondary | ICD-10-CM | POA: Diagnosis not present

## 2019-03-23 DIAGNOSIS — N2581 Secondary hyperparathyroidism of renal origin: Secondary | ICD-10-CM | POA: Diagnosis not present

## 2019-03-23 DIAGNOSIS — D631 Anemia in chronic kidney disease: Secondary | ICD-10-CM | POA: Diagnosis not present

## 2019-03-23 DIAGNOSIS — N186 End stage renal disease: Secondary | ICD-10-CM | POA: Diagnosis not present

## 2019-03-24 DIAGNOSIS — M329 Systemic lupus erythematosus, unspecified: Secondary | ICD-10-CM | POA: Diagnosis not present

## 2019-03-24 DIAGNOSIS — I7300 Raynaud's syndrome without gangrene: Secondary | ICD-10-CM | POA: Diagnosis not present

## 2019-03-24 DIAGNOSIS — N186 End stage renal disease: Secondary | ICD-10-CM | POA: Diagnosis not present

## 2019-03-24 DIAGNOSIS — R760 Raised antibody titer: Secondary | ICD-10-CM | POA: Diagnosis not present

## 2019-03-25 DIAGNOSIS — N2581 Secondary hyperparathyroidism of renal origin: Secondary | ICD-10-CM | POA: Diagnosis not present

## 2019-03-25 DIAGNOSIS — Z23 Encounter for immunization: Secondary | ICD-10-CM | POA: Diagnosis not present

## 2019-03-25 DIAGNOSIS — N186 End stage renal disease: Secondary | ICD-10-CM | POA: Diagnosis not present

## 2019-03-25 DIAGNOSIS — D631 Anemia in chronic kidney disease: Secondary | ICD-10-CM | POA: Diagnosis not present

## 2019-03-28 DIAGNOSIS — N186 End stage renal disease: Secondary | ICD-10-CM | POA: Diagnosis not present

## 2019-03-28 DIAGNOSIS — Z23 Encounter for immunization: Secondary | ICD-10-CM | POA: Diagnosis not present

## 2019-03-28 DIAGNOSIS — I82409 Acute embolism and thrombosis of unspecified deep veins of unspecified lower extremity: Secondary | ICD-10-CM | POA: Diagnosis not present

## 2019-03-28 DIAGNOSIS — D631 Anemia in chronic kidney disease: Secondary | ICD-10-CM | POA: Diagnosis not present

## 2019-03-28 DIAGNOSIS — N2581 Secondary hyperparathyroidism of renal origin: Secondary | ICD-10-CM | POA: Diagnosis not present

## 2019-03-30 DIAGNOSIS — N2581 Secondary hyperparathyroidism of renal origin: Secondary | ICD-10-CM | POA: Diagnosis not present

## 2019-03-30 DIAGNOSIS — Z23 Encounter for immunization: Secondary | ICD-10-CM | POA: Diagnosis not present

## 2019-03-30 DIAGNOSIS — D631 Anemia in chronic kidney disease: Secondary | ICD-10-CM | POA: Diagnosis not present

## 2019-03-30 DIAGNOSIS — N186 End stage renal disease: Secondary | ICD-10-CM | POA: Diagnosis not present

## 2019-04-01 DIAGNOSIS — N186 End stage renal disease: Secondary | ICD-10-CM | POA: Diagnosis not present

## 2019-04-01 DIAGNOSIS — D631 Anemia in chronic kidney disease: Secondary | ICD-10-CM | POA: Diagnosis not present

## 2019-04-01 DIAGNOSIS — N2581 Secondary hyperparathyroidism of renal origin: Secondary | ICD-10-CM | POA: Diagnosis not present

## 2019-04-01 DIAGNOSIS — Z23 Encounter for immunization: Secondary | ICD-10-CM | POA: Diagnosis not present

## 2019-04-04 DIAGNOSIS — Z23 Encounter for immunization: Secondary | ICD-10-CM | POA: Diagnosis not present

## 2019-04-04 DIAGNOSIS — N2581 Secondary hyperparathyroidism of renal origin: Secondary | ICD-10-CM | POA: Diagnosis not present

## 2019-04-04 DIAGNOSIS — D631 Anemia in chronic kidney disease: Secondary | ICD-10-CM | POA: Diagnosis not present

## 2019-04-04 DIAGNOSIS — N186 End stage renal disease: Secondary | ICD-10-CM | POA: Diagnosis not present

## 2019-04-04 DIAGNOSIS — I82409 Acute embolism and thrombosis of unspecified deep veins of unspecified lower extremity: Secondary | ICD-10-CM | POA: Diagnosis not present

## 2019-04-06 DIAGNOSIS — D631 Anemia in chronic kidney disease: Secondary | ICD-10-CM | POA: Diagnosis not present

## 2019-04-06 DIAGNOSIS — N186 End stage renal disease: Secondary | ICD-10-CM | POA: Diagnosis not present

## 2019-04-06 DIAGNOSIS — Z23 Encounter for immunization: Secondary | ICD-10-CM | POA: Diagnosis not present

## 2019-04-06 DIAGNOSIS — Z992 Dependence on renal dialysis: Secondary | ICD-10-CM | POA: Diagnosis not present

## 2019-04-06 DIAGNOSIS — N2581 Secondary hyperparathyroidism of renal origin: Secondary | ICD-10-CM | POA: Diagnosis not present

## 2019-04-08 DIAGNOSIS — N2581 Secondary hyperparathyroidism of renal origin: Secondary | ICD-10-CM | POA: Diagnosis not present

## 2019-04-08 DIAGNOSIS — N186 End stage renal disease: Secondary | ICD-10-CM | POA: Diagnosis not present

## 2019-04-08 DIAGNOSIS — D631 Anemia in chronic kidney disease: Secondary | ICD-10-CM | POA: Diagnosis not present

## 2019-04-08 DIAGNOSIS — D509 Iron deficiency anemia, unspecified: Secondary | ICD-10-CM | POA: Diagnosis not present

## 2019-04-11 DIAGNOSIS — N2581 Secondary hyperparathyroidism of renal origin: Secondary | ICD-10-CM | POA: Diagnosis not present

## 2019-04-11 DIAGNOSIS — D509 Iron deficiency anemia, unspecified: Secondary | ICD-10-CM | POA: Diagnosis not present

## 2019-04-11 DIAGNOSIS — I82409 Acute embolism and thrombosis of unspecified deep veins of unspecified lower extremity: Secondary | ICD-10-CM | POA: Diagnosis not present

## 2019-04-11 DIAGNOSIS — D631 Anemia in chronic kidney disease: Secondary | ICD-10-CM | POA: Diagnosis not present

## 2019-04-11 DIAGNOSIS — N186 End stage renal disease: Secondary | ICD-10-CM | POA: Diagnosis not present

## 2019-04-13 DIAGNOSIS — N186 End stage renal disease: Secondary | ICD-10-CM | POA: Diagnosis not present

## 2019-04-13 DIAGNOSIS — N2581 Secondary hyperparathyroidism of renal origin: Secondary | ICD-10-CM | POA: Diagnosis not present

## 2019-04-13 DIAGNOSIS — D509 Iron deficiency anemia, unspecified: Secondary | ICD-10-CM | POA: Diagnosis not present

## 2019-04-13 DIAGNOSIS — E119 Type 2 diabetes mellitus without complications: Secondary | ICD-10-CM | POA: Diagnosis not present

## 2019-04-13 DIAGNOSIS — D631 Anemia in chronic kidney disease: Secondary | ICD-10-CM | POA: Diagnosis not present

## 2019-04-15 DIAGNOSIS — N186 End stage renal disease: Secondary | ICD-10-CM | POA: Diagnosis not present

## 2019-04-15 DIAGNOSIS — N2581 Secondary hyperparathyroidism of renal origin: Secondary | ICD-10-CM | POA: Diagnosis not present

## 2019-04-15 DIAGNOSIS — D631 Anemia in chronic kidney disease: Secondary | ICD-10-CM | POA: Diagnosis not present

## 2019-04-15 DIAGNOSIS — D509 Iron deficiency anemia, unspecified: Secondary | ICD-10-CM | POA: Diagnosis not present

## 2019-04-18 DIAGNOSIS — I82409 Acute embolism and thrombosis of unspecified deep veins of unspecified lower extremity: Secondary | ICD-10-CM | POA: Diagnosis not present

## 2019-04-18 DIAGNOSIS — D509 Iron deficiency anemia, unspecified: Secondary | ICD-10-CM | POA: Diagnosis not present

## 2019-04-18 DIAGNOSIS — N2581 Secondary hyperparathyroidism of renal origin: Secondary | ICD-10-CM | POA: Diagnosis not present

## 2019-04-18 DIAGNOSIS — N186 End stage renal disease: Secondary | ICD-10-CM | POA: Diagnosis not present

## 2019-04-18 DIAGNOSIS — D631 Anemia in chronic kidney disease: Secondary | ICD-10-CM | POA: Diagnosis not present

## 2019-04-19 ENCOUNTER — Other Ambulatory Visit: Payer: Self-pay

## 2019-04-19 NOTE — Patient Outreach (Signed)
[facility]) Care Management  04/19/2019  Joy Anderson 04/01/1950 [id number]   Telephone Assessment    Outreach attempt #1 to patient. No answer after several rings.       Plan: RN CM will make outreach attempt to patient within the month of June.   Enzo Montgomery, RN,BSN,CCM [place] Management Telephonic Care Management Coordinator Direct Phone: [phone] Toll Free: [phone] Fax: [phone]

## 2019-04-20 DIAGNOSIS — D631 Anemia in chronic kidney disease: Secondary | ICD-10-CM | POA: Diagnosis not present

## 2019-04-20 DIAGNOSIS — D509 Iron deficiency anemia, unspecified: Secondary | ICD-10-CM | POA: Diagnosis not present

## 2019-04-20 DIAGNOSIS — N186 End stage renal disease: Secondary | ICD-10-CM | POA: Diagnosis not present

## 2019-04-20 DIAGNOSIS — N2581 Secondary hyperparathyroidism of renal origin: Secondary | ICD-10-CM | POA: Diagnosis not present

## 2019-04-22 DIAGNOSIS — N2581 Secondary hyperparathyroidism of renal origin: Secondary | ICD-10-CM | POA: Diagnosis not present

## 2019-04-22 DIAGNOSIS — D509 Iron deficiency anemia, unspecified: Secondary | ICD-10-CM | POA: Diagnosis not present

## 2019-04-22 DIAGNOSIS — N186 End stage renal disease: Secondary | ICD-10-CM | POA: Diagnosis not present

## 2019-04-22 DIAGNOSIS — D631 Anemia in chronic kidney disease: Secondary | ICD-10-CM | POA: Diagnosis not present

## 2019-04-25 DIAGNOSIS — D631 Anemia in chronic kidney disease: Secondary | ICD-10-CM | POA: Diagnosis not present

## 2019-04-25 DIAGNOSIS — I82409 Acute embolism and thrombosis of unspecified deep veins of unspecified lower extremity: Secondary | ICD-10-CM | POA: Diagnosis not present

## 2019-04-25 DIAGNOSIS — N2581 Secondary hyperparathyroidism of renal origin: Secondary | ICD-10-CM | POA: Diagnosis not present

## 2019-04-25 DIAGNOSIS — D509 Iron deficiency anemia, unspecified: Secondary | ICD-10-CM | POA: Diagnosis not present

## 2019-04-25 DIAGNOSIS — N186 End stage renal disease: Secondary | ICD-10-CM | POA: Diagnosis not present

## 2019-04-27 DIAGNOSIS — D631 Anemia in chronic kidney disease: Secondary | ICD-10-CM | POA: Diagnosis not present

## 2019-04-27 DIAGNOSIS — D509 Iron deficiency anemia, unspecified: Secondary | ICD-10-CM | POA: Diagnosis not present

## 2019-04-27 DIAGNOSIS — N186 End stage renal disease: Secondary | ICD-10-CM | POA: Diagnosis not present

## 2019-04-27 DIAGNOSIS — N2581 Secondary hyperparathyroidism of renal origin: Secondary | ICD-10-CM | POA: Diagnosis not present

## 2019-04-29 DIAGNOSIS — D631 Anemia in chronic kidney disease: Secondary | ICD-10-CM | POA: Diagnosis not present

## 2019-04-29 DIAGNOSIS — N186 End stage renal disease: Secondary | ICD-10-CM | POA: Diagnosis not present

## 2019-04-29 DIAGNOSIS — N2581 Secondary hyperparathyroidism of renal origin: Secondary | ICD-10-CM | POA: Diagnosis not present

## 2019-04-29 DIAGNOSIS — D509 Iron deficiency anemia, unspecified: Secondary | ICD-10-CM | POA: Diagnosis not present

## 2019-05-02 ENCOUNTER — Other Ambulatory Visit: Payer: Self-pay | Admitting: *Deleted

## 2019-05-02 DIAGNOSIS — N2581 Secondary hyperparathyroidism of renal origin: Secondary | ICD-10-CM | POA: Diagnosis not present

## 2019-05-02 DIAGNOSIS — D509 Iron deficiency anemia, unspecified: Secondary | ICD-10-CM | POA: Diagnosis not present

## 2019-05-02 DIAGNOSIS — D631 Anemia in chronic kidney disease: Secondary | ICD-10-CM | POA: Diagnosis not present

## 2019-05-02 DIAGNOSIS — Z1211 Encounter for screening for malignant neoplasm of colon: Secondary | ICD-10-CM

## 2019-05-02 DIAGNOSIS — I82409 Acute embolism and thrombosis of unspecified deep veins of unspecified lower extremity: Secondary | ICD-10-CM | POA: Diagnosis not present

## 2019-05-02 DIAGNOSIS — N186 End stage renal disease: Secondary | ICD-10-CM | POA: Diagnosis not present

## 2019-05-02 LAB — POC HEMOCCULT BLD/STL (HOME/3-CARD/SCREEN)
Card #2 Fecal Occult Blod, POC: POSITIVE
Card #3 Fecal Occult Blood, POC: NEGATIVE
Fecal Occult Blood, POC: POSITIVE — AB

## 2019-05-03 ENCOUNTER — Other Ambulatory Visit: Payer: Self-pay | Admitting: *Deleted

## 2019-05-03 DIAGNOSIS — Z1211 Encounter for screening for malignant neoplasm of colon: Secondary | ICD-10-CM

## 2019-05-04 DIAGNOSIS — D509 Iron deficiency anemia, unspecified: Secondary | ICD-10-CM | POA: Diagnosis not present

## 2019-05-04 DIAGNOSIS — N2581 Secondary hyperparathyroidism of renal origin: Secondary | ICD-10-CM | POA: Diagnosis not present

## 2019-05-04 DIAGNOSIS — N186 End stage renal disease: Secondary | ICD-10-CM | POA: Diagnosis not present

## 2019-05-04 DIAGNOSIS — D631 Anemia in chronic kidney disease: Secondary | ICD-10-CM | POA: Diagnosis not present

## 2019-05-06 DIAGNOSIS — Z992 Dependence on renal dialysis: Secondary | ICD-10-CM | POA: Diagnosis not present

## 2019-05-06 DIAGNOSIS — D631 Anemia in chronic kidney disease: Secondary | ICD-10-CM | POA: Diagnosis not present

## 2019-05-06 DIAGNOSIS — N186 End stage renal disease: Secondary | ICD-10-CM | POA: Diagnosis not present

## 2019-05-06 DIAGNOSIS — N2581 Secondary hyperparathyroidism of renal origin: Secondary | ICD-10-CM | POA: Diagnosis not present

## 2019-05-06 DIAGNOSIS — D509 Iron deficiency anemia, unspecified: Secondary | ICD-10-CM | POA: Diagnosis not present

## 2019-05-09 DIAGNOSIS — D631 Anemia in chronic kidney disease: Secondary | ICD-10-CM | POA: Diagnosis not present

## 2019-05-09 DIAGNOSIS — N2581 Secondary hyperparathyroidism of renal origin: Secondary | ICD-10-CM | POA: Diagnosis not present

## 2019-05-09 DIAGNOSIS — I82409 Acute embolism and thrombosis of unspecified deep veins of unspecified lower extremity: Secondary | ICD-10-CM | POA: Diagnosis not present

## 2019-05-09 DIAGNOSIS — D509 Iron deficiency anemia, unspecified: Secondary | ICD-10-CM | POA: Diagnosis not present

## 2019-05-09 DIAGNOSIS — N186 End stage renal disease: Secondary | ICD-10-CM | POA: Diagnosis not present

## 2019-05-11 DIAGNOSIS — N2581 Secondary hyperparathyroidism of renal origin: Secondary | ICD-10-CM | POA: Diagnosis not present

## 2019-05-11 DIAGNOSIS — D509 Iron deficiency anemia, unspecified: Secondary | ICD-10-CM | POA: Diagnosis not present

## 2019-05-11 DIAGNOSIS — E119 Type 2 diabetes mellitus without complications: Secondary | ICD-10-CM | POA: Diagnosis not present

## 2019-05-11 DIAGNOSIS — N186 End stage renal disease: Secondary | ICD-10-CM | POA: Diagnosis not present

## 2019-05-11 DIAGNOSIS — D631 Anemia in chronic kidney disease: Secondary | ICD-10-CM | POA: Diagnosis not present

## 2019-05-13 DIAGNOSIS — D509 Iron deficiency anemia, unspecified: Secondary | ICD-10-CM | POA: Diagnosis not present

## 2019-05-13 DIAGNOSIS — D631 Anemia in chronic kidney disease: Secondary | ICD-10-CM | POA: Diagnosis not present

## 2019-05-13 DIAGNOSIS — N2581 Secondary hyperparathyroidism of renal origin: Secondary | ICD-10-CM | POA: Diagnosis not present

## 2019-05-13 DIAGNOSIS — N186 End stage renal disease: Secondary | ICD-10-CM | POA: Diagnosis not present

## 2019-05-16 DIAGNOSIS — D631 Anemia in chronic kidney disease: Secondary | ICD-10-CM | POA: Diagnosis not present

## 2019-05-16 DIAGNOSIS — N186 End stage renal disease: Secondary | ICD-10-CM | POA: Diagnosis not present

## 2019-05-16 DIAGNOSIS — N2581 Secondary hyperparathyroidism of renal origin: Secondary | ICD-10-CM | POA: Diagnosis not present

## 2019-05-16 DIAGNOSIS — I82409 Acute embolism and thrombosis of unspecified deep veins of unspecified lower extremity: Secondary | ICD-10-CM | POA: Diagnosis not present

## 2019-05-16 DIAGNOSIS — D509 Iron deficiency anemia, unspecified: Secondary | ICD-10-CM | POA: Diagnosis not present

## 2019-05-18 DIAGNOSIS — N2581 Secondary hyperparathyroidism of renal origin: Secondary | ICD-10-CM | POA: Diagnosis not present

## 2019-05-18 DIAGNOSIS — D509 Iron deficiency anemia, unspecified: Secondary | ICD-10-CM | POA: Diagnosis not present

## 2019-05-18 DIAGNOSIS — N186 End stage renal disease: Secondary | ICD-10-CM | POA: Diagnosis not present

## 2019-05-18 DIAGNOSIS — D631 Anemia in chronic kidney disease: Secondary | ICD-10-CM | POA: Diagnosis not present

## 2019-05-19 DIAGNOSIS — K625 Hemorrhage of anus and rectum: Secondary | ICD-10-CM | POA: Diagnosis not present

## 2019-05-19 DIAGNOSIS — N186 End stage renal disease: Secondary | ICD-10-CM | POA: Diagnosis not present

## 2019-05-19 DIAGNOSIS — Z86718 Personal history of other venous thrombosis and embolism: Secondary | ICD-10-CM | POA: Diagnosis not present

## 2019-05-20 DIAGNOSIS — D631 Anemia in chronic kidney disease: Secondary | ICD-10-CM | POA: Diagnosis not present

## 2019-05-20 DIAGNOSIS — N186 End stage renal disease: Secondary | ICD-10-CM | POA: Diagnosis not present

## 2019-05-20 DIAGNOSIS — D509 Iron deficiency anemia, unspecified: Secondary | ICD-10-CM | POA: Diagnosis not present

## 2019-05-20 DIAGNOSIS — N2581 Secondary hyperparathyroidism of renal origin: Secondary | ICD-10-CM | POA: Diagnosis not present

## 2019-05-23 DIAGNOSIS — D509 Iron deficiency anemia, unspecified: Secondary | ICD-10-CM | POA: Diagnosis not present

## 2019-05-23 DIAGNOSIS — N2581 Secondary hyperparathyroidism of renal origin: Secondary | ICD-10-CM | POA: Diagnosis not present

## 2019-05-23 DIAGNOSIS — D631 Anemia in chronic kidney disease: Secondary | ICD-10-CM | POA: Diagnosis not present

## 2019-05-23 DIAGNOSIS — I82409 Acute embolism and thrombosis of unspecified deep veins of unspecified lower extremity: Secondary | ICD-10-CM | POA: Diagnosis not present

## 2019-05-23 DIAGNOSIS — N186 End stage renal disease: Secondary | ICD-10-CM | POA: Diagnosis not present

## 2019-05-25 DIAGNOSIS — N186 End stage renal disease: Secondary | ICD-10-CM | POA: Diagnosis not present

## 2019-05-25 DIAGNOSIS — N2581 Secondary hyperparathyroidism of renal origin: Secondary | ICD-10-CM | POA: Diagnosis not present

## 2019-05-25 DIAGNOSIS — D509 Iron deficiency anemia, unspecified: Secondary | ICD-10-CM | POA: Diagnosis not present

## 2019-05-25 DIAGNOSIS — D631 Anemia in chronic kidney disease: Secondary | ICD-10-CM | POA: Diagnosis not present

## 2019-05-27 DIAGNOSIS — D509 Iron deficiency anemia, unspecified: Secondary | ICD-10-CM | POA: Diagnosis not present

## 2019-05-27 DIAGNOSIS — N2581 Secondary hyperparathyroidism of renal origin: Secondary | ICD-10-CM | POA: Diagnosis not present

## 2019-05-27 DIAGNOSIS — D631 Anemia in chronic kidney disease: Secondary | ICD-10-CM | POA: Diagnosis not present

## 2019-05-27 DIAGNOSIS — N186 End stage renal disease: Secondary | ICD-10-CM | POA: Diagnosis not present

## 2019-05-30 DIAGNOSIS — N2581 Secondary hyperparathyroidism of renal origin: Secondary | ICD-10-CM | POA: Diagnosis not present

## 2019-05-30 DIAGNOSIS — D509 Iron deficiency anemia, unspecified: Secondary | ICD-10-CM | POA: Diagnosis not present

## 2019-05-30 DIAGNOSIS — N186 End stage renal disease: Secondary | ICD-10-CM | POA: Diagnosis not present

## 2019-05-30 DIAGNOSIS — I82409 Acute embolism and thrombosis of unspecified deep veins of unspecified lower extremity: Secondary | ICD-10-CM | POA: Diagnosis not present

## 2019-05-30 DIAGNOSIS — D631 Anemia in chronic kidney disease: Secondary | ICD-10-CM | POA: Diagnosis not present

## 2019-06-01 DIAGNOSIS — N186 End stage renal disease: Secondary | ICD-10-CM | POA: Diagnosis not present

## 2019-06-01 DIAGNOSIS — D509 Iron deficiency anemia, unspecified: Secondary | ICD-10-CM | POA: Diagnosis not present

## 2019-06-01 DIAGNOSIS — N2581 Secondary hyperparathyroidism of renal origin: Secondary | ICD-10-CM | POA: Diagnosis not present

## 2019-06-01 DIAGNOSIS — D631 Anemia in chronic kidney disease: Secondary | ICD-10-CM | POA: Diagnosis not present

## 2019-06-03 DIAGNOSIS — N2581 Secondary hyperparathyroidism of renal origin: Secondary | ICD-10-CM | POA: Diagnosis not present

## 2019-06-03 DIAGNOSIS — N186 End stage renal disease: Secondary | ICD-10-CM | POA: Diagnosis not present

## 2019-06-03 DIAGNOSIS — D509 Iron deficiency anemia, unspecified: Secondary | ICD-10-CM | POA: Diagnosis not present

## 2019-06-03 DIAGNOSIS — D631 Anemia in chronic kidney disease: Secondary | ICD-10-CM | POA: Diagnosis not present

## 2019-06-06 DIAGNOSIS — D631 Anemia in chronic kidney disease: Secondary | ICD-10-CM | POA: Diagnosis not present

## 2019-06-06 DIAGNOSIS — N186 End stage renal disease: Secondary | ICD-10-CM | POA: Diagnosis not present

## 2019-06-06 DIAGNOSIS — N2581 Secondary hyperparathyroidism of renal origin: Secondary | ICD-10-CM | POA: Diagnosis not present

## 2019-06-06 DIAGNOSIS — Z992 Dependence on renal dialysis: Secondary | ICD-10-CM | POA: Diagnosis not present

## 2019-06-06 DIAGNOSIS — I82409 Acute embolism and thrombosis of unspecified deep veins of unspecified lower extremity: Secondary | ICD-10-CM | POA: Diagnosis not present

## 2019-06-06 DIAGNOSIS — D509 Iron deficiency anemia, unspecified: Secondary | ICD-10-CM | POA: Diagnosis not present

## 2019-06-08 DIAGNOSIS — D509 Iron deficiency anemia, unspecified: Secondary | ICD-10-CM | POA: Diagnosis not present

## 2019-06-08 DIAGNOSIS — N186 End stage renal disease: Secondary | ICD-10-CM | POA: Diagnosis not present

## 2019-06-08 DIAGNOSIS — N2581 Secondary hyperparathyroidism of renal origin: Secondary | ICD-10-CM | POA: Diagnosis not present

## 2019-06-08 DIAGNOSIS — D631 Anemia in chronic kidney disease: Secondary | ICD-10-CM | POA: Diagnosis not present

## 2019-06-08 DIAGNOSIS — E119 Type 2 diabetes mellitus without complications: Secondary | ICD-10-CM | POA: Diagnosis not present

## 2019-06-10 DIAGNOSIS — D631 Anemia in chronic kidney disease: Secondary | ICD-10-CM | POA: Diagnosis not present

## 2019-06-10 DIAGNOSIS — N186 End stage renal disease: Secondary | ICD-10-CM | POA: Diagnosis not present

## 2019-06-10 DIAGNOSIS — N2581 Secondary hyperparathyroidism of renal origin: Secondary | ICD-10-CM | POA: Diagnosis not present

## 2019-06-10 DIAGNOSIS — D509 Iron deficiency anemia, unspecified: Secondary | ICD-10-CM | POA: Diagnosis not present

## 2019-06-13 DIAGNOSIS — D509 Iron deficiency anemia, unspecified: Secondary | ICD-10-CM | POA: Diagnosis not present

## 2019-06-13 DIAGNOSIS — D631 Anemia in chronic kidney disease: Secondary | ICD-10-CM | POA: Diagnosis not present

## 2019-06-13 DIAGNOSIS — N186 End stage renal disease: Secondary | ICD-10-CM | POA: Diagnosis not present

## 2019-06-13 DIAGNOSIS — N2581 Secondary hyperparathyroidism of renal origin: Secondary | ICD-10-CM | POA: Diagnosis not present

## 2019-06-13 DIAGNOSIS — I82409 Acute embolism and thrombosis of unspecified deep veins of unspecified lower extremity: Secondary | ICD-10-CM | POA: Diagnosis not present

## 2019-06-15 DIAGNOSIS — N186 End stage renal disease: Secondary | ICD-10-CM | POA: Diagnosis not present

## 2019-06-15 DIAGNOSIS — N2581 Secondary hyperparathyroidism of renal origin: Secondary | ICD-10-CM | POA: Diagnosis not present

## 2019-06-15 DIAGNOSIS — D631 Anemia in chronic kidney disease: Secondary | ICD-10-CM | POA: Diagnosis not present

## 2019-06-15 DIAGNOSIS — D509 Iron deficiency anemia, unspecified: Secondary | ICD-10-CM | POA: Diagnosis not present

## 2019-06-17 DIAGNOSIS — D509 Iron deficiency anemia, unspecified: Secondary | ICD-10-CM | POA: Diagnosis not present

## 2019-06-17 DIAGNOSIS — N186 End stage renal disease: Secondary | ICD-10-CM | POA: Diagnosis not present

## 2019-06-17 DIAGNOSIS — D631 Anemia in chronic kidney disease: Secondary | ICD-10-CM | POA: Diagnosis not present

## 2019-06-17 DIAGNOSIS — N2581 Secondary hyperparathyroidism of renal origin: Secondary | ICD-10-CM | POA: Diagnosis not present

## 2019-06-20 DIAGNOSIS — I82409 Acute embolism and thrombosis of unspecified deep veins of unspecified lower extremity: Secondary | ICD-10-CM | POA: Diagnosis not present

## 2019-06-20 DIAGNOSIS — D509 Iron deficiency anemia, unspecified: Secondary | ICD-10-CM | POA: Diagnosis not present

## 2019-06-20 DIAGNOSIS — D631 Anemia in chronic kidney disease: Secondary | ICD-10-CM | POA: Diagnosis not present

## 2019-06-20 DIAGNOSIS — N2581 Secondary hyperparathyroidism of renal origin: Secondary | ICD-10-CM | POA: Diagnosis not present

## 2019-06-20 DIAGNOSIS — N186 End stage renal disease: Secondary | ICD-10-CM | POA: Diagnosis not present

## 2019-06-22 DIAGNOSIS — D509 Iron deficiency anemia, unspecified: Secondary | ICD-10-CM | POA: Diagnosis not present

## 2019-06-22 DIAGNOSIS — D631 Anemia in chronic kidney disease: Secondary | ICD-10-CM | POA: Diagnosis not present

## 2019-06-22 DIAGNOSIS — N186 End stage renal disease: Secondary | ICD-10-CM | POA: Diagnosis not present

## 2019-06-22 DIAGNOSIS — N2581 Secondary hyperparathyroidism of renal origin: Secondary | ICD-10-CM | POA: Diagnosis not present

## 2019-06-23 ENCOUNTER — Other Ambulatory Visit: Payer: Self-pay

## 2019-06-23 NOTE — Patient Outreach (Signed)
Joy Anderson) Care Management  06/23/2019  Joy Anderson Mar 09, 1950 [id number]   Telephone Assessment    Unsuccessful outreach attempt to patient.      Plan: RN CM will make quarterly outreach attempt in the month of Sept.    Salaam Battershell Verl Blalock [place] Management Telephonic Care Management Coordinator Direct Phone: [phone] Toll Free: [phone] Fax: [phone]

## 2019-06-24 DIAGNOSIS — I120 Hypertensive chronic kidney disease with stage 5 chronic kidney disease or end stage renal disease: Secondary | ICD-10-CM | POA: Diagnosis not present

## 2019-06-24 DIAGNOSIS — E785 Hyperlipidemia, unspecified: Secondary | ICD-10-CM | POA: Diagnosis not present

## 2019-06-24 DIAGNOSIS — E7800 Pure hypercholesterolemia, unspecified: Secondary | ICD-10-CM | POA: Diagnosis not present

## 2019-06-24 DIAGNOSIS — N186 End stage renal disease: Secondary | ICD-10-CM | POA: Diagnosis not present

## 2019-06-24 DIAGNOSIS — K633 Ulcer of intestine: Secondary | ICD-10-CM | POA: Diagnosis not present

## 2019-06-24 DIAGNOSIS — Z86718 Personal history of other venous thrombosis and embolism: Secondary | ICD-10-CM | POA: Diagnosis not present

## 2019-06-24 DIAGNOSIS — K5730 Diverticulosis of large intestine without perforation or abscess without bleeding: Secondary | ICD-10-CM | POA: Diagnosis not present

## 2019-06-24 DIAGNOSIS — E1122 Type 2 diabetes mellitus with diabetic chronic kidney disease: Secondary | ICD-10-CM | POA: Diagnosis not present

## 2019-06-24 DIAGNOSIS — I7300 Raynaud's syndrome without gangrene: Secondary | ICD-10-CM | POA: Diagnosis not present

## 2019-06-24 DIAGNOSIS — K635 Polyp of colon: Secondary | ICD-10-CM | POA: Diagnosis not present

## 2019-06-24 DIAGNOSIS — K5140 Inflammatory polyps of colon without complications: Secondary | ICD-10-CM | POA: Diagnosis not present

## 2019-06-24 DIAGNOSIS — D6862 Lupus anticoagulant syndrome: Secondary | ICD-10-CM | POA: Diagnosis not present

## 2019-06-24 DIAGNOSIS — K5289 Other specified noninfective gastroenteritis and colitis: Secondary | ICD-10-CM | POA: Diagnosis not present

## 2019-06-24 DIAGNOSIS — K641 Second degree hemorrhoids: Secondary | ICD-10-CM | POA: Diagnosis not present

## 2019-06-24 DIAGNOSIS — M329 Systemic lupus erythematosus, unspecified: Secondary | ICD-10-CM | POA: Diagnosis not present

## 2019-06-24 DIAGNOSIS — K625 Hemorrhage of anus and rectum: Secondary | ICD-10-CM | POA: Diagnosis not present

## 2019-06-24 DIAGNOSIS — D122 Benign neoplasm of ascending colon: Secondary | ICD-10-CM | POA: Diagnosis not present

## 2019-06-24 DIAGNOSIS — Z992 Dependence on renal dialysis: Secondary | ICD-10-CM | POA: Diagnosis not present

## 2019-06-24 DIAGNOSIS — I279 Pulmonary heart disease, unspecified: Secondary | ICD-10-CM | POA: Diagnosis not present

## 2019-06-25 DIAGNOSIS — D631 Anemia in chronic kidney disease: Secondary | ICD-10-CM | POA: Diagnosis not present

## 2019-06-25 DIAGNOSIS — N2581 Secondary hyperparathyroidism of renal origin: Secondary | ICD-10-CM | POA: Diagnosis not present

## 2019-06-25 DIAGNOSIS — N186 End stage renal disease: Secondary | ICD-10-CM | POA: Diagnosis not present

## 2019-06-25 DIAGNOSIS — D509 Iron deficiency anemia, unspecified: Secondary | ICD-10-CM | POA: Diagnosis not present

## 2019-06-27 DIAGNOSIS — N2581 Secondary hyperparathyroidism of renal origin: Secondary | ICD-10-CM | POA: Diagnosis not present

## 2019-06-27 DIAGNOSIS — D509 Iron deficiency anemia, unspecified: Secondary | ICD-10-CM | POA: Diagnosis not present

## 2019-06-27 DIAGNOSIS — N186 End stage renal disease: Secondary | ICD-10-CM | POA: Diagnosis not present

## 2019-06-27 DIAGNOSIS — D631 Anemia in chronic kidney disease: Secondary | ICD-10-CM | POA: Diagnosis not present

## 2019-06-27 DIAGNOSIS — I82409 Acute embolism and thrombosis of unspecified deep veins of unspecified lower extremity: Secondary | ICD-10-CM | POA: Diagnosis not present

## 2019-06-29 DIAGNOSIS — N2581 Secondary hyperparathyroidism of renal origin: Secondary | ICD-10-CM | POA: Diagnosis not present

## 2019-06-29 DIAGNOSIS — N186 End stage renal disease: Secondary | ICD-10-CM | POA: Diagnosis not present

## 2019-06-29 DIAGNOSIS — D509 Iron deficiency anemia, unspecified: Secondary | ICD-10-CM | POA: Diagnosis not present

## 2019-06-29 DIAGNOSIS — D631 Anemia in chronic kidney disease: Secondary | ICD-10-CM | POA: Diagnosis not present

## 2019-06-29 LAB — HM COLONOSCOPY

## 2019-07-01 DIAGNOSIS — D509 Iron deficiency anemia, unspecified: Secondary | ICD-10-CM | POA: Diagnosis not present

## 2019-07-01 DIAGNOSIS — N2581 Secondary hyperparathyroidism of renal origin: Secondary | ICD-10-CM | POA: Diagnosis not present

## 2019-07-01 DIAGNOSIS — N186 End stage renal disease: Secondary | ICD-10-CM | POA: Diagnosis not present

## 2019-07-01 DIAGNOSIS — D631 Anemia in chronic kidney disease: Secondary | ICD-10-CM | POA: Diagnosis not present

## 2019-07-04 DIAGNOSIS — D509 Iron deficiency anemia, unspecified: Secondary | ICD-10-CM | POA: Diagnosis not present

## 2019-07-04 DIAGNOSIS — D631 Anemia in chronic kidney disease: Secondary | ICD-10-CM | POA: Diagnosis not present

## 2019-07-04 DIAGNOSIS — I82409 Acute embolism and thrombosis of unspecified deep veins of unspecified lower extremity: Secondary | ICD-10-CM | POA: Diagnosis not present

## 2019-07-04 DIAGNOSIS — N2581 Secondary hyperparathyroidism of renal origin: Secondary | ICD-10-CM | POA: Diagnosis not present

## 2019-07-04 DIAGNOSIS — N186 End stage renal disease: Secondary | ICD-10-CM | POA: Diagnosis not present

## 2019-07-06 ENCOUNTER — Encounter: Payer: Self-pay | Admitting: Family Medicine

## 2019-07-06 DIAGNOSIS — Z992 Dependence on renal dialysis: Secondary | ICD-10-CM | POA: Diagnosis not present

## 2019-07-06 DIAGNOSIS — N2581 Secondary hyperparathyroidism of renal origin: Secondary | ICD-10-CM | POA: Diagnosis not present

## 2019-07-06 DIAGNOSIS — D509 Iron deficiency anemia, unspecified: Secondary | ICD-10-CM | POA: Diagnosis not present

## 2019-07-06 DIAGNOSIS — N186 End stage renal disease: Secondary | ICD-10-CM | POA: Diagnosis not present

## 2019-07-06 DIAGNOSIS — D631 Anemia in chronic kidney disease: Secondary | ICD-10-CM | POA: Diagnosis not present

## 2019-07-08 DIAGNOSIS — D509 Iron deficiency anemia, unspecified: Secondary | ICD-10-CM | POA: Diagnosis not present

## 2019-07-08 DIAGNOSIS — N186 End stage renal disease: Secondary | ICD-10-CM | POA: Diagnosis not present

## 2019-07-08 DIAGNOSIS — D631 Anemia in chronic kidney disease: Secondary | ICD-10-CM | POA: Diagnosis not present

## 2019-07-08 DIAGNOSIS — N2581 Secondary hyperparathyroidism of renal origin: Secondary | ICD-10-CM | POA: Diagnosis not present

## 2019-07-11 DIAGNOSIS — D631 Anemia in chronic kidney disease: Secondary | ICD-10-CM | POA: Diagnosis not present

## 2019-07-11 DIAGNOSIS — N2581 Secondary hyperparathyroidism of renal origin: Secondary | ICD-10-CM | POA: Diagnosis not present

## 2019-07-11 DIAGNOSIS — N186 End stage renal disease: Secondary | ICD-10-CM | POA: Diagnosis not present

## 2019-07-11 DIAGNOSIS — D509 Iron deficiency anemia, unspecified: Secondary | ICD-10-CM | POA: Diagnosis not present

## 2019-07-11 DIAGNOSIS — I82409 Acute embolism and thrombosis of unspecified deep veins of unspecified lower extremity: Secondary | ICD-10-CM | POA: Diagnosis not present

## 2019-07-13 DIAGNOSIS — D509 Iron deficiency anemia, unspecified: Secondary | ICD-10-CM | POA: Diagnosis not present

## 2019-07-13 DIAGNOSIS — N2581 Secondary hyperparathyroidism of renal origin: Secondary | ICD-10-CM | POA: Diagnosis not present

## 2019-07-13 DIAGNOSIS — N186 End stage renal disease: Secondary | ICD-10-CM | POA: Diagnosis not present

## 2019-07-13 DIAGNOSIS — D631 Anemia in chronic kidney disease: Secondary | ICD-10-CM | POA: Diagnosis not present

## 2019-07-13 DIAGNOSIS — E119 Type 2 diabetes mellitus without complications: Secondary | ICD-10-CM | POA: Diagnosis not present

## 2019-07-15 DIAGNOSIS — N2581 Secondary hyperparathyroidism of renal origin: Secondary | ICD-10-CM | POA: Diagnosis not present

## 2019-07-15 DIAGNOSIS — D631 Anemia in chronic kidney disease: Secondary | ICD-10-CM | POA: Diagnosis not present

## 2019-07-15 DIAGNOSIS — D509 Iron deficiency anemia, unspecified: Secondary | ICD-10-CM | POA: Diagnosis not present

## 2019-07-15 DIAGNOSIS — N186 End stage renal disease: Secondary | ICD-10-CM | POA: Diagnosis not present

## 2019-07-18 DIAGNOSIS — D631 Anemia in chronic kidney disease: Secondary | ICD-10-CM | POA: Diagnosis not present

## 2019-07-18 DIAGNOSIS — M25552 Pain in left hip: Secondary | ICD-10-CM | POA: Diagnosis not present

## 2019-07-18 DIAGNOSIS — N2581 Secondary hyperparathyroidism of renal origin: Secondary | ICD-10-CM | POA: Diagnosis not present

## 2019-07-18 DIAGNOSIS — M1612 Unilateral primary osteoarthritis, left hip: Secondary | ICD-10-CM | POA: Diagnosis not present

## 2019-07-18 DIAGNOSIS — D509 Iron deficiency anemia, unspecified: Secondary | ICD-10-CM | POA: Diagnosis not present

## 2019-07-18 DIAGNOSIS — N186 End stage renal disease: Secondary | ICD-10-CM | POA: Diagnosis not present

## 2019-07-18 DIAGNOSIS — Z86718 Personal history of other venous thrombosis and embolism: Secondary | ICD-10-CM | POA: Diagnosis not present

## 2019-07-18 DIAGNOSIS — M87052 Idiopathic aseptic necrosis of left femur: Secondary | ICD-10-CM | POA: Diagnosis not present

## 2019-07-18 DIAGNOSIS — R760 Raised antibody titer: Secondary | ICD-10-CM | POA: Diagnosis not present

## 2019-07-18 DIAGNOSIS — Z96641 Presence of right artificial hip joint: Secondary | ICD-10-CM | POA: Diagnosis not present

## 2019-07-18 DIAGNOSIS — M329 Systemic lupus erythematosus, unspecified: Secondary | ICD-10-CM | POA: Diagnosis not present

## 2019-07-18 DIAGNOSIS — I82409 Acute embolism and thrombosis of unspecified deep veins of unspecified lower extremity: Secondary | ICD-10-CM | POA: Diagnosis not present

## 2019-07-20 DIAGNOSIS — D631 Anemia in chronic kidney disease: Secondary | ICD-10-CM | POA: Diagnosis not present

## 2019-07-20 DIAGNOSIS — N186 End stage renal disease: Secondary | ICD-10-CM | POA: Diagnosis not present

## 2019-07-20 DIAGNOSIS — D509 Iron deficiency anemia, unspecified: Secondary | ICD-10-CM | POA: Diagnosis not present

## 2019-07-20 DIAGNOSIS — N2581 Secondary hyperparathyroidism of renal origin: Secondary | ICD-10-CM | POA: Diagnosis not present

## 2019-07-21 ENCOUNTER — Other Ambulatory Visit: Payer: Self-pay

## 2019-07-21 ENCOUNTER — Ambulatory Visit (INDEPENDENT_AMBULATORY_CARE_PROVIDER_SITE_OTHER): Payer: Medicare HMO | Admitting: Family Medicine

## 2019-07-21 ENCOUNTER — Encounter: Payer: Self-pay | Admitting: Family Medicine

## 2019-07-21 VITALS — BP 94/54 | HR 100 | Ht 62.0 in | Wt 147.0 lb

## 2019-07-21 DIAGNOSIS — Z0000 Encounter for general adult medical examination without abnormal findings: Secondary | ICD-10-CM | POA: Diagnosis not present

## 2019-07-21 DIAGNOSIS — M8580 Other specified disorders of bone density and structure, unspecified site: Secondary | ICD-10-CM | POA: Diagnosis not present

## 2019-07-21 DIAGNOSIS — Z1231 Encounter for screening mammogram for malignant neoplasm of breast: Secondary | ICD-10-CM | POA: Diagnosis not present

## 2019-07-21 NOTE — Patient Instructions (Addendum)
Cut your losartan in half. Take half a tab daily.  Keep an eye on BPs at home.      Joy Anderson , Thank you for taking time to come for your Medicare Wellness Visit. I appreciate your ongoing commitment to your health goals. Please review the following plan we discussed and let me know if I can assist you in the future.   These are the goals we discussed: Goals    . Exercise 150 min/wk Moderate Activity     Keep up your walking for 20 minutes a day!! This is great!        This is a list of the screening recommended for you and due dates:  Health Maintenance  Topic Date Due  . DEXA scan (bone density measurement)  09/18/2017  . Flu Shot  08/07/2019  . Mammogram  10/16/2019  . Colon Cancer Screening  06/29/2022  . Tetanus Vaccine  09/06/2024  . COVID-19 Vaccine  Completed  .  Hepatitis C: One time screening is recommended by Center for Disease Control  (CDC) for  adults born from [birth year] through 1966.   Completed  . Pneumonia vaccines  Completed     Please schedule your mammogram.

## 2019-07-21 NOTE — Progress Notes (Signed)
Subjective:   Joy Anderson is a 69 y.o. female who presents for Medicare Annual (Subsequent) preventive examination.  Review of Systems    Negative.        Objective:    Today's Vitals   07/21/19 1530  BP: (!) 94/54  Pulse: 100  SpO2: 98%  Weight: 147 lb (66.7 kg)  Height: 5\' 2"  (1.575 m)   Body mass index is 26.89 kg/m.  Advanced Directives 07/21/2019 11/15/2018 08/20/2017 08/19/2016 05/23/2013 04/01/2013  Does Patient Have a Medical Advance Directive? Yes No No No Patient does not have advance directive;Patient would like information Patient does not have advance directive;Patient would like information  Type of Advance Directive [place] in Chart? No - copy requested - - - - -  Would patient like information on creating a medical advance directive? - No - Patient declined Yes (MAU/Ambulatory/Procedural Areas - Information given) Yes (MAU/Ambulatory/Procedural Areas - Information given) Advance directive brochure given (Outpatient ONLY) Advance directive packet given    Current Medications (verified) Outpatient Encounter Medications as of 07/21/2019  Medication Sig  . acetaminophen (TYLENOL) 500 MG tablet Take 500 mg by mouth every 6 (six) hours as needed. Two tablets  . amLODipine (NORVASC) 5 MG tablet Take 5 mg by mouth every evening.  Marland Kitchen atorvastatin (LIPITOR) 20 MG tablet Take 1 tablet (20 mg total) by mouth at bedtime. Due for follow up visit  . CALCIUM PO Take by mouth.  . cinacalcet (SENSIPAR) 30 MG tablet Take 2 tablets by mouth daily.  Marland Kitchen HYDROcodone-acetaminophen (NORCO/VICODIN) 5-325 MG tablet Take 1 tablet by mouth every 4 (four) hours as needed for moderate pain.  . hydrocortisone 2.5 % cream Apply 1 application topically 2 (two) times daily as needed.  . hydroxychloroquine (PLAQUENIL) 200 MG tablet Take 200 mg by mouth 2 (two) times daily.    Marland Kitchen losartan (COZAAR) 100 MG tablet Take 100 mg by mouth  daily.  . multivitamin (RENA-VIT) TABS tablet   . warfarin (COUMADIN) 1 MG tablet   . warfarin (COUMADIN) 4 MG tablet Take by mouth.  . warfarin (COUMADIN) 5 MG tablet TAKE 1 TABLET BY MOUTH ON SUNDAY, WENESDAY, THURSDAY, FRIDAY, AND SATURDAY. THEN TAKE 1 AND 1/2 TABLET ON MONDAY AND TUESDAY (Patient taking differently: Patient states taking 2.5mg (half tablet) daily)  . [DISCONTINUED] AMBULATORY NON FORMULARY MEDICATION Medication Name: Shingrix IM x 1. Then repeat in 2-6 months.   No facility-administered encounter medications on file as of 07/21/2019.    Allergies (verified) Ace inhibitors, Metformin, Fish oil, and Metformin and related   History: Past Medical History:  Diagnosis Date  . Antiphospholipid antibody with hypercoagulable state ([place]) 08/27/2010  . DVT (deep venous thrombosis) ([facility])    anti phospholipid antibody + -- Dr Lewanda Rife  . Hypertension   . OAB (overactive bladder)    off meds  . Post-menopausal   . Proteinuria    Dr Tressie Ellis  . Raynaud's disease    Past Surgical History:  Procedure Laterality Date  . AV FISTULA PLACEMENT  2016  . CATARACT EXTRACTION, BILATERAL  2017  . TOTAL ABDOMINAL HYSTERECTOMY  2001   for fibroids w/ 1 oophorectomy   Family History  Problem Relation Age of Onset  . Alcohol abuse Father   . Colon cancer Brother   . Prostate cancer Brother   . Cerebral palsy Brother   . Diabetes Other    Social History   Socioeconomic History  .  Marital status: Divorced    Spouse name: Not on file  . Number of children: Not on file  . Years of education: Not on file  . Highest education level: Not on file  Occupational History  . Not on file  Tobacco Use  . Smoking status: Never Smoker  . Smokeless tobacco: Never Used  Substance and Sexual Activity  . Alcohol use: Yes    Comment: occasional  . Drug use: No  . Sexual activity: Not on file  Other Topics Concern  . Not on file  Social History Narrative  . Not on file   Social  Determinants of Health   Financial Resource Strain:   . Difficulty of Paying Living Expenses:   Food Insecurity: No Food Insecurity  . Worried About Charity fundraiser in the Last Year: Never true  . Ran Out of Food in the Last Year: Never true  Transportation Needs: Unmet Transportation Needs  . Lack of Transportation (Medical): Yes  . Lack of Transportation (Non-Medical): Yes  Physical Activity:   . Days of Exercise per Week:   . Minutes of Exercise per Session:   Stress:   . Feeling of Stress :   Social Connections:   . Frequency of Communication with Friends and Family:   . Frequency of Social Gatherings with Friends and Family:   . Attends Religious Services:   . Active Member of Clubs or Organizations:   . Attends Archivist Meetings:   Marland Kitchen Marital Status:     Tobacco Counseling Counseling given: Not Answered   Clinical Intake:  Pre-visit preparation completed: Yes        BMI - recorded: 26 Nutritional Status: BMI 25 -29 Overweight Nutritional Risks: None Diabetes: No     Diabetic?No  Interpreter Needed?: No      Activities of Daily Living In your present state of health, do you have any difficulty performing the following activities: 07/21/2019 11/15/2018  Hearing? N N  Vision? N N  Difficulty concentrating or making decisions? N N  Walking or climbing stairs? Y Y  Comment HIp pain recent hip replacement surgery  Dressing or bathing? N N  Doing errands, shopping? N Y  Comment - unable to drive-recent hip surgery  Preparing Food and eating ? N N  Using the Toilet? N N  In the past six months, have you accidently leaked urine? N N  Do you have problems with loss of bowel control? N N  Managing your Medications? N N  Managing your Finances? N N  Housekeeping or managing your Housekeeping? N Y  Comment - lives with daughter who handles it  Some recent data might be hidden    Patient Care Team: Hali Marry, MD as PCP - General  (Family Medicine) Aretta Nip, MD as Referring Physician (Rheumatology) Katherina Right. Nicole Kindred, MD (Nephrology) Dr. Haynes Bast (Ophthalmology) [place], Tomasa Blase, RN as [place] Management  Indicate any recent Medical Services you may have received from other than [facility] providers in the past year (date may be approximate).     Assessment:   This is a routine wellness examination for [place].  Hearing/Vision screen No exam data present  Dietary issues and exercise activities discussed: Current Exercise Habits: Home exercise routine, Type of exercise: walking, Time (Minutes): 20, Frequency (Times/Week): 7, Weekly Exercise (Minutes/Week): 140, Intensity: Mild, Exercise limited by: orthopedic condition(s)  Goals    . Exercise 150 min/wk Moderate Activity     Keep up  your walking for 20 minutes a day!! This is great!       Depression Screen PHQ 2/9 Scores 11/15/2018 08/20/2017 08/19/2016 08/16/2015 08/16/2015 04/25/2014 10/28/2012  PHQ - 2 Score 0 0 0 0 0 0 0    Fall Risk Fall Risk  07/21/2019 03/01/2019 11/15/2018 08/24/2018 08/20/2017  Falls in the past year? 0 0 1 0 No  Number falls in past yr: 0 0 0 - -  Injury with Fall? - 0 0 - -  Risk for fall due to : Impaired mobility Medication side effect History of fall(s) Impaired balance/gait Impaired balance/gait  Follow up - Falls evaluation completed;Education provided Falls evaluation completed;Education provided - -    Any stairs in or around the home? Yes    ASSISTIVE DEVICES UTILIZED TO PREVENT FALLS:  Life alert? No  Use of a cane, walker or w/c? Yes  Grab bars in the bathroom? Not asked   TIMED UP AND GO:  Was the test performed? No .  Length of time to ambulate 10 feet: less than 10  sec.   Gait steady and fast with assistive device  Cognitive Function:     6CIT Screen 07/21/2019 08/20/2017 08/20/2016  What Year? 0 points 0 points 0 points  What month? 0 points 0 points 0 points  What time?  0 points 0 points 0 points  Count back from 20 0 points 0 points 0 points  Months in reverse 0 points 0 points 0 points  Repeat phrase 0 points 2 points 0 points  Total Score 0 2 0    Immunizations Immunization History  Administered Date(s) Administered  . Hepatitis B, adult 10/25/2014, 11/24/2014, 05/14/2015  . Influenza Whole 12/12/2009  . Influenza, High Dose Seasonal PF 10/06/2016, 10/25/2018, 10/25/2018  . Influenza,inj,Quad PF,6+ Mos 10/14/2012, 11/01/2013  . Influenza-Unspecified 10/25/2014  . Moderna SARS-COVID-2 Vaccination 02/18/2019, 03/18/2019  . Pneumococcal Conjugate-13 04/25/2014  . Pneumococcal Polysaccharide-23 12/12/2009, 12/20/2014  . Tdap 09/06/2004, 09/07/2014    TDAP status: Up to date Flu Vaccine status: Up to date Pneumococcal vaccine status: Up to date Covid-19 vaccine status: Completed vaccines  Qualifies for Shingles Vaccine? No   Zostavax completed No   Shingrix Completed?: No.    Education has been provided regarding the importance of this vaccine. Patient has been advised to call insurance company to determine out of pocket expense if they have not yet received this vaccine. Advised may also receive vaccine at local pharmacy or Health Dept. Verbalized acceptance and understanding.  Screening Tests Health Maintenance  Topic Date Due  . DEXA SCAN  09/18/2017  . INFLUENZA VACCINE  08/07/2019  . MAMMOGRAM  10/16/2019  . COLONOSCOPY  06/29/2022  . TETANUS/TDAP  09/06/2024  . COVID-19 Vaccine  Completed  . Hepatitis C Screening  Completed  . PNA vac Low Risk Adult  Completed    Health Maintenance  Health Maintenance Due  Topic Date Due  . DEXA SCAN  09/18/2017      Colorectal cancer screening: Completed 06/29/2019. Repeat every 3 years Mammogram status: Completed 2019. Repeat every year Bone Density status: Completed 2017. Results reflect: Bone density results: OSTEOPENIA. Repeat every 2-3 years.  Lung Cancer Screening: (Low Dose CT Chest  recommended if Age 14-80 years, 30 pack-year currently smoking OR have quit w/in 15years.) does not qualify.   Lung Cancer Screening Referral: NA  Additional Screening:  Hepatitis C Screening: does qualify; Completed -Yes  Vision Screening: Recommended annual ophthalmology exams for early detection of glaucoma and other disorders of  the eye. Is the patient up to date with their annual eye exam?  Yes  Who is the provider or what is the name of the office in which the patient attends annual eye exams? Dr. Kathlen Mody   Dental Screening: Recommended annual dental exams for proper oral hygiene  Community Resource Referral / Chronic Care Management: CRR required this visit?  No   CCM required this visit?  No      Plan:     I have personally reviewed and noted the following in the patient's chart:   . Medical and social history . Use of alcohol, tobacco or illicit drugs  . Current medications and supplements . Functional ability and status . Nutritional status . Physical activity . Advanced directives . List of other physicians . Hospitalizations, surgeries, and ER visits in previous 12 months . Vitals . Screenings to include cognitive, depression, and falls . Referrals and appointments  In addition, I have reviewed and discussed with patient certain preventive protocols, quality metrics, and best practice recommendations. A written personalized care plan for preventive services as well as general preventive health recommendations were provided to patient.    Hypertension-blood pressure is low here in the office today she had dialysis yesterday.  She is actually been tracking her blood pressures lately and they have been running under 110 so we discussed cutting her lisinopril in half and taking a half a tab daily continue the 5 mg of amlodipine.  Monitor blood pressure over the next 2 to 3 weeks.  I want the systolic pressure still under 130 but greater than 100.    [name], MD   07/21/2019

## 2019-07-22 DIAGNOSIS — N2581 Secondary hyperparathyroidism of renal origin: Secondary | ICD-10-CM | POA: Diagnosis not present

## 2019-07-22 DIAGNOSIS — D631 Anemia in chronic kidney disease: Secondary | ICD-10-CM | POA: Diagnosis not present

## 2019-07-22 DIAGNOSIS — N186 End stage renal disease: Secondary | ICD-10-CM | POA: Diagnosis not present

## 2019-07-22 DIAGNOSIS — D509 Iron deficiency anemia, unspecified: Secondary | ICD-10-CM | POA: Diagnosis not present

## 2019-07-25 DIAGNOSIS — N186 End stage renal disease: Secondary | ICD-10-CM | POA: Diagnosis not present

## 2019-07-25 DIAGNOSIS — I82409 Acute embolism and thrombosis of unspecified deep veins of unspecified lower extremity: Secondary | ICD-10-CM | POA: Diagnosis not present

## 2019-07-25 DIAGNOSIS — D631 Anemia in chronic kidney disease: Secondary | ICD-10-CM | POA: Diagnosis not present

## 2019-07-25 DIAGNOSIS — N2581 Secondary hyperparathyroidism of renal origin: Secondary | ICD-10-CM | POA: Diagnosis not present

## 2019-07-25 DIAGNOSIS — D509 Iron deficiency anemia, unspecified: Secondary | ICD-10-CM | POA: Diagnosis not present

## 2019-07-27 DIAGNOSIS — D631 Anemia in chronic kidney disease: Secondary | ICD-10-CM | POA: Diagnosis not present

## 2019-07-27 DIAGNOSIS — N2581 Secondary hyperparathyroidism of renal origin: Secondary | ICD-10-CM | POA: Diagnosis not present

## 2019-07-27 DIAGNOSIS — N186 End stage renal disease: Secondary | ICD-10-CM | POA: Diagnosis not present

## 2019-07-27 DIAGNOSIS — D509 Iron deficiency anemia, unspecified: Secondary | ICD-10-CM | POA: Diagnosis not present

## 2019-07-28 DIAGNOSIS — H524 Presbyopia: Secondary | ICD-10-CM | POA: Diagnosis not present

## 2019-07-28 DIAGNOSIS — Z961 Presence of intraocular lens: Secondary | ICD-10-CM | POA: Diagnosis not present

## 2019-07-28 DIAGNOSIS — H52223 Regular astigmatism, bilateral: Secondary | ICD-10-CM | POA: Diagnosis not present

## 2019-07-28 DIAGNOSIS — H401131 Primary open-angle glaucoma, bilateral, mild stage: Secondary | ICD-10-CM | POA: Diagnosis not present

## 2019-07-29 DIAGNOSIS — N2581 Secondary hyperparathyroidism of renal origin: Secondary | ICD-10-CM | POA: Diagnosis not present

## 2019-07-29 DIAGNOSIS — D509 Iron deficiency anemia, unspecified: Secondary | ICD-10-CM | POA: Diagnosis not present

## 2019-07-29 DIAGNOSIS — N186 End stage renal disease: Secondary | ICD-10-CM | POA: Diagnosis not present

## 2019-07-29 DIAGNOSIS — D631 Anemia in chronic kidney disease: Secondary | ICD-10-CM | POA: Diagnosis not present

## 2019-08-01 DIAGNOSIS — N2581 Secondary hyperparathyroidism of renal origin: Secondary | ICD-10-CM | POA: Diagnosis not present

## 2019-08-01 DIAGNOSIS — N186 End stage renal disease: Secondary | ICD-10-CM | POA: Diagnosis not present

## 2019-08-01 DIAGNOSIS — I82409 Acute embolism and thrombosis of unspecified deep veins of unspecified lower extremity: Secondary | ICD-10-CM | POA: Diagnosis not present

## 2019-08-01 DIAGNOSIS — D509 Iron deficiency anemia, unspecified: Secondary | ICD-10-CM | POA: Diagnosis not present

## 2019-08-01 DIAGNOSIS — D631 Anemia in chronic kidney disease: Secondary | ICD-10-CM | POA: Diagnosis not present

## 2019-08-03 DIAGNOSIS — N186 End stage renal disease: Secondary | ICD-10-CM | POA: Diagnosis not present

## 2019-08-03 DIAGNOSIS — D631 Anemia in chronic kidney disease: Secondary | ICD-10-CM | POA: Diagnosis not present

## 2019-08-03 DIAGNOSIS — N2581 Secondary hyperparathyroidism of renal origin: Secondary | ICD-10-CM | POA: Diagnosis not present

## 2019-08-03 DIAGNOSIS — D509 Iron deficiency anemia, unspecified: Secondary | ICD-10-CM | POA: Diagnosis not present

## 2019-08-05 DIAGNOSIS — D509 Iron deficiency anemia, unspecified: Secondary | ICD-10-CM | POA: Diagnosis not present

## 2019-08-05 DIAGNOSIS — D631 Anemia in chronic kidney disease: Secondary | ICD-10-CM | POA: Diagnosis not present

## 2019-08-05 DIAGNOSIS — N186 End stage renal disease: Secondary | ICD-10-CM | POA: Diagnosis not present

## 2019-08-05 DIAGNOSIS — N2581 Secondary hyperparathyroidism of renal origin: Secondary | ICD-10-CM | POA: Diagnosis not present

## 2019-08-06 DIAGNOSIS — N186 End stage renal disease: Secondary | ICD-10-CM | POA: Diagnosis not present

## 2019-08-06 DIAGNOSIS — Z992 Dependence on renal dialysis: Secondary | ICD-10-CM | POA: Diagnosis not present

## 2019-08-08 DIAGNOSIS — N2581 Secondary hyperparathyroidism of renal origin: Secondary | ICD-10-CM | POA: Diagnosis not present

## 2019-08-08 DIAGNOSIS — D509 Iron deficiency anemia, unspecified: Secondary | ICD-10-CM | POA: Diagnosis not present

## 2019-08-08 DIAGNOSIS — D631 Anemia in chronic kidney disease: Secondary | ICD-10-CM | POA: Diagnosis not present

## 2019-08-08 DIAGNOSIS — N186 End stage renal disease: Secondary | ICD-10-CM | POA: Diagnosis not present

## 2019-08-08 DIAGNOSIS — I82409 Acute embolism and thrombosis of unspecified deep veins of unspecified lower extremity: Secondary | ICD-10-CM | POA: Diagnosis not present

## 2019-08-10 DIAGNOSIS — D631 Anemia in chronic kidney disease: Secondary | ICD-10-CM | POA: Diagnosis not present

## 2019-08-10 DIAGNOSIS — D509 Iron deficiency anemia, unspecified: Secondary | ICD-10-CM | POA: Diagnosis not present

## 2019-08-10 DIAGNOSIS — E119 Type 2 diabetes mellitus without complications: Secondary | ICD-10-CM | POA: Diagnosis not present

## 2019-08-10 DIAGNOSIS — N186 End stage renal disease: Secondary | ICD-10-CM | POA: Diagnosis not present

## 2019-08-10 DIAGNOSIS — N2581 Secondary hyperparathyroidism of renal origin: Secondary | ICD-10-CM | POA: Diagnosis not present

## 2019-08-12 DIAGNOSIS — D631 Anemia in chronic kidney disease: Secondary | ICD-10-CM | POA: Diagnosis not present

## 2019-08-12 DIAGNOSIS — N186 End stage renal disease: Secondary | ICD-10-CM | POA: Diagnosis not present

## 2019-08-12 DIAGNOSIS — N2581 Secondary hyperparathyroidism of renal origin: Secondary | ICD-10-CM | POA: Diagnosis not present

## 2019-08-12 DIAGNOSIS — D509 Iron deficiency anemia, unspecified: Secondary | ICD-10-CM | POA: Diagnosis not present

## 2019-08-15 DIAGNOSIS — I82409 Acute embolism and thrombosis of unspecified deep veins of unspecified lower extremity: Secondary | ICD-10-CM | POA: Diagnosis not present

## 2019-08-15 DIAGNOSIS — N2581 Secondary hyperparathyroidism of renal origin: Secondary | ICD-10-CM | POA: Diagnosis not present

## 2019-08-15 DIAGNOSIS — D631 Anemia in chronic kidney disease: Secondary | ICD-10-CM | POA: Diagnosis not present

## 2019-08-15 DIAGNOSIS — D509 Iron deficiency anemia, unspecified: Secondary | ICD-10-CM | POA: Diagnosis not present

## 2019-08-15 DIAGNOSIS — N186 End stage renal disease: Secondary | ICD-10-CM | POA: Diagnosis not present

## 2019-08-17 DIAGNOSIS — D509 Iron deficiency anemia, unspecified: Secondary | ICD-10-CM | POA: Diagnosis not present

## 2019-08-17 DIAGNOSIS — N2581 Secondary hyperparathyroidism of renal origin: Secondary | ICD-10-CM | POA: Diagnosis not present

## 2019-08-17 DIAGNOSIS — N186 End stage renal disease: Secondary | ICD-10-CM | POA: Diagnosis not present

## 2019-08-17 DIAGNOSIS — D631 Anemia in chronic kidney disease: Secondary | ICD-10-CM | POA: Diagnosis not present

## 2019-08-19 DIAGNOSIS — D509 Iron deficiency anemia, unspecified: Secondary | ICD-10-CM | POA: Diagnosis not present

## 2019-08-19 DIAGNOSIS — N2581 Secondary hyperparathyroidism of renal origin: Secondary | ICD-10-CM | POA: Diagnosis not present

## 2019-08-19 DIAGNOSIS — N186 End stage renal disease: Secondary | ICD-10-CM | POA: Diagnosis not present

## 2019-08-19 DIAGNOSIS — D631 Anemia in chronic kidney disease: Secondary | ICD-10-CM | POA: Diagnosis not present

## 2019-08-22 DIAGNOSIS — I82409 Acute embolism and thrombosis of unspecified deep veins of unspecified lower extremity: Secondary | ICD-10-CM | POA: Diagnosis not present

## 2019-08-22 DIAGNOSIS — N2581 Secondary hyperparathyroidism of renal origin: Secondary | ICD-10-CM | POA: Diagnosis not present

## 2019-08-22 DIAGNOSIS — D509 Iron deficiency anemia, unspecified: Secondary | ICD-10-CM | POA: Diagnosis not present

## 2019-08-22 DIAGNOSIS — N186 End stage renal disease: Secondary | ICD-10-CM | POA: Diagnosis not present

## 2019-08-22 DIAGNOSIS — D631 Anemia in chronic kidney disease: Secondary | ICD-10-CM | POA: Diagnosis not present

## 2019-08-24 DIAGNOSIS — N186 End stage renal disease: Secondary | ICD-10-CM | POA: Diagnosis not present

## 2019-08-24 DIAGNOSIS — D509 Iron deficiency anemia, unspecified: Secondary | ICD-10-CM | POA: Diagnosis not present

## 2019-08-24 DIAGNOSIS — N2581 Secondary hyperparathyroidism of renal origin: Secondary | ICD-10-CM | POA: Diagnosis not present

## 2019-08-24 DIAGNOSIS — D631 Anemia in chronic kidney disease: Secondary | ICD-10-CM | POA: Diagnosis not present

## 2019-08-26 DIAGNOSIS — D631 Anemia in chronic kidney disease: Secondary | ICD-10-CM | POA: Diagnosis not present

## 2019-08-26 DIAGNOSIS — D509 Iron deficiency anemia, unspecified: Secondary | ICD-10-CM | POA: Diagnosis not present

## 2019-08-26 DIAGNOSIS — N2581 Secondary hyperparathyroidism of renal origin: Secondary | ICD-10-CM | POA: Diagnosis not present

## 2019-08-26 DIAGNOSIS — N186 End stage renal disease: Secondary | ICD-10-CM | POA: Diagnosis not present

## 2019-08-29 DIAGNOSIS — D631 Anemia in chronic kidney disease: Secondary | ICD-10-CM | POA: Diagnosis not present

## 2019-08-29 DIAGNOSIS — N186 End stage renal disease: Secondary | ICD-10-CM | POA: Diagnosis not present

## 2019-08-29 DIAGNOSIS — D509 Iron deficiency anemia, unspecified: Secondary | ICD-10-CM | POA: Diagnosis not present

## 2019-08-29 DIAGNOSIS — I82409 Acute embolism and thrombosis of unspecified deep veins of unspecified lower extremity: Secondary | ICD-10-CM | POA: Diagnosis not present

## 2019-08-29 DIAGNOSIS — N2581 Secondary hyperparathyroidism of renal origin: Secondary | ICD-10-CM | POA: Diagnosis not present

## 2019-08-31 DIAGNOSIS — D509 Iron deficiency anemia, unspecified: Secondary | ICD-10-CM | POA: Diagnosis not present

## 2019-08-31 DIAGNOSIS — D631 Anemia in chronic kidney disease: Secondary | ICD-10-CM | POA: Diagnosis not present

## 2019-08-31 DIAGNOSIS — N2581 Secondary hyperparathyroidism of renal origin: Secondary | ICD-10-CM | POA: Diagnosis not present

## 2019-08-31 DIAGNOSIS — N186 End stage renal disease: Secondary | ICD-10-CM | POA: Diagnosis not present

## 2019-09-01 ENCOUNTER — Ambulatory Visit (INDEPENDENT_AMBULATORY_CARE_PROVIDER_SITE_OTHER): Payer: Medicare HMO

## 2019-09-01 ENCOUNTER — Ambulatory Visit: Payer: Medicare HMO | Admitting: Family Medicine

## 2019-09-01 ENCOUNTER — Other Ambulatory Visit: Payer: Self-pay

## 2019-09-01 DIAGNOSIS — Z1231 Encounter for screening mammogram for malignant neoplasm of breast: Secondary | ICD-10-CM

## 2019-09-02 DIAGNOSIS — D631 Anemia in chronic kidney disease: Secondary | ICD-10-CM | POA: Diagnosis not present

## 2019-09-02 DIAGNOSIS — N186 End stage renal disease: Secondary | ICD-10-CM | POA: Diagnosis not present

## 2019-09-02 DIAGNOSIS — D509 Iron deficiency anemia, unspecified: Secondary | ICD-10-CM | POA: Diagnosis not present

## 2019-09-02 DIAGNOSIS — N2581 Secondary hyperparathyroidism of renal origin: Secondary | ICD-10-CM | POA: Diagnosis not present

## 2019-09-05 DIAGNOSIS — N2581 Secondary hyperparathyroidism of renal origin: Secondary | ICD-10-CM | POA: Diagnosis not present

## 2019-09-05 DIAGNOSIS — I82409 Acute embolism and thrombosis of unspecified deep veins of unspecified lower extremity: Secondary | ICD-10-CM | POA: Diagnosis not present

## 2019-09-05 DIAGNOSIS — N186 End stage renal disease: Secondary | ICD-10-CM | POA: Diagnosis not present

## 2019-09-05 DIAGNOSIS — D509 Iron deficiency anemia, unspecified: Secondary | ICD-10-CM | POA: Diagnosis not present

## 2019-09-05 DIAGNOSIS — D631 Anemia in chronic kidney disease: Secondary | ICD-10-CM | POA: Diagnosis not present

## 2019-09-06 DIAGNOSIS — Z992 Dependence on renal dialysis: Secondary | ICD-10-CM | POA: Diagnosis not present

## 2019-09-06 DIAGNOSIS — N186 End stage renal disease: Secondary | ICD-10-CM | POA: Diagnosis not present

## 2019-09-07 DIAGNOSIS — D509 Iron deficiency anemia, unspecified: Secondary | ICD-10-CM | POA: Diagnosis not present

## 2019-09-07 DIAGNOSIS — D631 Anemia in chronic kidney disease: Secondary | ICD-10-CM | POA: Diagnosis not present

## 2019-09-07 DIAGNOSIS — E119 Type 2 diabetes mellitus without complications: Secondary | ICD-10-CM | POA: Diagnosis not present

## 2019-09-07 DIAGNOSIS — N2581 Secondary hyperparathyroidism of renal origin: Secondary | ICD-10-CM | POA: Diagnosis not present

## 2019-09-07 DIAGNOSIS — N186 End stage renal disease: Secondary | ICD-10-CM | POA: Diagnosis not present

## 2019-09-07 DIAGNOSIS — Z23 Encounter for immunization: Secondary | ICD-10-CM | POA: Diagnosis not present

## 2019-09-09 DIAGNOSIS — Z23 Encounter for immunization: Secondary | ICD-10-CM | POA: Diagnosis not present

## 2019-09-09 DIAGNOSIS — N2581 Secondary hyperparathyroidism of renal origin: Secondary | ICD-10-CM | POA: Diagnosis not present

## 2019-09-09 DIAGNOSIS — N186 End stage renal disease: Secondary | ICD-10-CM | POA: Diagnosis not present

## 2019-09-09 DIAGNOSIS — D631 Anemia in chronic kidney disease: Secondary | ICD-10-CM | POA: Diagnosis not present

## 2019-09-09 DIAGNOSIS — D509 Iron deficiency anemia, unspecified: Secondary | ICD-10-CM | POA: Diagnosis not present

## 2019-09-12 DIAGNOSIS — D631 Anemia in chronic kidney disease: Secondary | ICD-10-CM | POA: Diagnosis not present

## 2019-09-12 DIAGNOSIS — I82409 Acute embolism and thrombosis of unspecified deep veins of unspecified lower extremity: Secondary | ICD-10-CM | POA: Diagnosis not present

## 2019-09-12 DIAGNOSIS — D509 Iron deficiency anemia, unspecified: Secondary | ICD-10-CM | POA: Diagnosis not present

## 2019-09-12 DIAGNOSIS — N2581 Secondary hyperparathyroidism of renal origin: Secondary | ICD-10-CM | POA: Diagnosis not present

## 2019-09-12 DIAGNOSIS — Z23 Encounter for immunization: Secondary | ICD-10-CM | POA: Diagnosis not present

## 2019-09-12 DIAGNOSIS — N186 End stage renal disease: Secondary | ICD-10-CM | POA: Diagnosis not present

## 2019-09-14 DIAGNOSIS — D631 Anemia in chronic kidney disease: Secondary | ICD-10-CM | POA: Diagnosis not present

## 2019-09-14 DIAGNOSIS — N2581 Secondary hyperparathyroidism of renal origin: Secondary | ICD-10-CM | POA: Diagnosis not present

## 2019-09-14 DIAGNOSIS — D509 Iron deficiency anemia, unspecified: Secondary | ICD-10-CM | POA: Diagnosis not present

## 2019-09-14 DIAGNOSIS — Z23 Encounter for immunization: Secondary | ICD-10-CM | POA: Diagnosis not present

## 2019-09-14 DIAGNOSIS — N186 End stage renal disease: Secondary | ICD-10-CM | POA: Diagnosis not present

## 2019-09-16 DIAGNOSIS — D509 Iron deficiency anemia, unspecified: Secondary | ICD-10-CM | POA: Diagnosis not present

## 2019-09-16 DIAGNOSIS — N186 End stage renal disease: Secondary | ICD-10-CM | POA: Diagnosis not present

## 2019-09-16 DIAGNOSIS — Z23 Encounter for immunization: Secondary | ICD-10-CM | POA: Diagnosis not present

## 2019-09-16 DIAGNOSIS — D631 Anemia in chronic kidney disease: Secondary | ICD-10-CM | POA: Diagnosis not present

## 2019-09-16 DIAGNOSIS — N2581 Secondary hyperparathyroidism of renal origin: Secondary | ICD-10-CM | POA: Diagnosis not present

## 2019-09-19 DIAGNOSIS — D631 Anemia in chronic kidney disease: Secondary | ICD-10-CM | POA: Diagnosis not present

## 2019-09-19 DIAGNOSIS — N186 End stage renal disease: Secondary | ICD-10-CM | POA: Diagnosis not present

## 2019-09-19 DIAGNOSIS — I82409 Acute embolism and thrombosis of unspecified deep veins of unspecified lower extremity: Secondary | ICD-10-CM | POA: Diagnosis not present

## 2019-09-19 DIAGNOSIS — N2581 Secondary hyperparathyroidism of renal origin: Secondary | ICD-10-CM | POA: Diagnosis not present

## 2019-09-19 DIAGNOSIS — Z23 Encounter for immunization: Secondary | ICD-10-CM | POA: Diagnosis not present

## 2019-09-19 DIAGNOSIS — D509 Iron deficiency anemia, unspecified: Secondary | ICD-10-CM | POA: Diagnosis not present

## 2019-09-20 ENCOUNTER — Other Ambulatory Visit: Payer: Self-pay

## 2019-09-20 NOTE — Patient Outreach (Signed)
[facility]) Care Management  09/20/2019  ZYKERIAH MATHIA [birth year] [id number]   Telephone Assessment Quarterly Call     Unsuccessful outreach attempt to patient.       Plan: RN CM will attempt quarterly outreach call to patient within the month of December if no return call from patient.   Enzo Montgomery, RN,BSN,CCM [place] Management Telephonic Care Management Coordinator Direct Phone: [phone] Toll Free: [phone] Fax: [phone]

## 2019-09-21 ENCOUNTER — Ambulatory Visit: Payer: Self-pay

## 2019-09-21 DIAGNOSIS — N186 End stage renal disease: Secondary | ICD-10-CM | POA: Diagnosis not present

## 2019-09-21 DIAGNOSIS — D509 Iron deficiency anemia, unspecified: Secondary | ICD-10-CM | POA: Diagnosis not present

## 2019-09-21 DIAGNOSIS — N2581 Secondary hyperparathyroidism of renal origin: Secondary | ICD-10-CM | POA: Diagnosis not present

## 2019-09-21 DIAGNOSIS — D631 Anemia in chronic kidney disease: Secondary | ICD-10-CM | POA: Diagnosis not present

## 2019-09-21 DIAGNOSIS — Z23 Encounter for immunization: Secondary | ICD-10-CM | POA: Diagnosis not present

## 2019-09-23 DIAGNOSIS — N2581 Secondary hyperparathyroidism of renal origin: Secondary | ICD-10-CM | POA: Diagnosis not present

## 2019-09-23 DIAGNOSIS — D509 Iron deficiency anemia, unspecified: Secondary | ICD-10-CM | POA: Diagnosis not present

## 2019-09-23 DIAGNOSIS — N186 End stage renal disease: Secondary | ICD-10-CM | POA: Diagnosis not present

## 2019-09-23 DIAGNOSIS — Z23 Encounter for immunization: Secondary | ICD-10-CM | POA: Diagnosis not present

## 2019-09-23 DIAGNOSIS — D631 Anemia in chronic kidney disease: Secondary | ICD-10-CM | POA: Diagnosis not present

## 2019-09-26 DIAGNOSIS — N2581 Secondary hyperparathyroidism of renal origin: Secondary | ICD-10-CM | POA: Diagnosis not present

## 2019-09-26 DIAGNOSIS — D509 Iron deficiency anemia, unspecified: Secondary | ICD-10-CM | POA: Diagnosis not present

## 2019-09-26 DIAGNOSIS — D631 Anemia in chronic kidney disease: Secondary | ICD-10-CM | POA: Diagnosis not present

## 2019-09-26 DIAGNOSIS — Z23 Encounter for immunization: Secondary | ICD-10-CM | POA: Diagnosis not present

## 2019-09-26 DIAGNOSIS — N186 End stage renal disease: Secondary | ICD-10-CM | POA: Diagnosis not present

## 2019-09-26 DIAGNOSIS — I82409 Acute embolism and thrombosis of unspecified deep veins of unspecified lower extremity: Secondary | ICD-10-CM | POA: Diagnosis not present

## 2019-09-28 DIAGNOSIS — D509 Iron deficiency anemia, unspecified: Secondary | ICD-10-CM | POA: Diagnosis not present

## 2019-09-28 DIAGNOSIS — N186 End stage renal disease: Secondary | ICD-10-CM | POA: Diagnosis not present

## 2019-09-28 DIAGNOSIS — D631 Anemia in chronic kidney disease: Secondary | ICD-10-CM | POA: Diagnosis not present

## 2019-09-28 DIAGNOSIS — N2581 Secondary hyperparathyroidism of renal origin: Secondary | ICD-10-CM | POA: Diagnosis not present

## 2019-09-28 DIAGNOSIS — Z23 Encounter for immunization: Secondary | ICD-10-CM | POA: Diagnosis not present

## 2019-09-30 DIAGNOSIS — Z23 Encounter for immunization: Secondary | ICD-10-CM | POA: Diagnosis not present

## 2019-09-30 DIAGNOSIS — D509 Iron deficiency anemia, unspecified: Secondary | ICD-10-CM | POA: Diagnosis not present

## 2019-09-30 DIAGNOSIS — N186 End stage renal disease: Secondary | ICD-10-CM | POA: Diagnosis not present

## 2019-09-30 DIAGNOSIS — N2581 Secondary hyperparathyroidism of renal origin: Secondary | ICD-10-CM | POA: Diagnosis not present

## 2019-09-30 DIAGNOSIS — D631 Anemia in chronic kidney disease: Secondary | ICD-10-CM | POA: Diagnosis not present

## 2019-10-03 DIAGNOSIS — M1612 Unilateral primary osteoarthritis, left hip: Secondary | ICD-10-CM | POA: Diagnosis not present

## 2019-10-03 DIAGNOSIS — I82409 Acute embolism and thrombosis of unspecified deep veins of unspecified lower extremity: Secondary | ICD-10-CM | POA: Diagnosis not present

## 2019-10-03 DIAGNOSIS — N2581 Secondary hyperparathyroidism of renal origin: Secondary | ICD-10-CM | POA: Diagnosis not present

## 2019-10-03 DIAGNOSIS — M25552 Pain in left hip: Secondary | ICD-10-CM | POA: Diagnosis not present

## 2019-10-03 DIAGNOSIS — N186 End stage renal disease: Secondary | ICD-10-CM | POA: Diagnosis not present

## 2019-10-03 DIAGNOSIS — D509 Iron deficiency anemia, unspecified: Secondary | ICD-10-CM | POA: Diagnosis not present

## 2019-10-03 DIAGNOSIS — D631 Anemia in chronic kidney disease: Secondary | ICD-10-CM | POA: Diagnosis not present

## 2019-10-03 DIAGNOSIS — Z23 Encounter for immunization: Secondary | ICD-10-CM | POA: Diagnosis not present

## 2019-10-05 DIAGNOSIS — M329 Systemic lupus erythematosus, unspecified: Secondary | ICD-10-CM | POA: Diagnosis not present

## 2019-10-05 DIAGNOSIS — M25552 Pain in left hip: Secondary | ICD-10-CM | POA: Diagnosis not present

## 2019-10-05 DIAGNOSIS — D649 Anemia, unspecified: Secondary | ICD-10-CM | POA: Diagnosis not present

## 2019-10-05 DIAGNOSIS — D631 Anemia in chronic kidney disease: Secondary | ICD-10-CM | POA: Diagnosis not present

## 2019-10-05 DIAGNOSIS — Z01818 Encounter for other preprocedural examination: Secondary | ICD-10-CM | POA: Diagnosis not present

## 2019-10-05 DIAGNOSIS — Z95828 Presence of other vascular implants and grafts: Secondary | ICD-10-CM | POA: Diagnosis not present

## 2019-10-05 DIAGNOSIS — D509 Iron deficiency anemia, unspecified: Secondary | ICD-10-CM | POA: Diagnosis not present

## 2019-10-05 DIAGNOSIS — Z23 Encounter for immunization: Secondary | ICD-10-CM | POA: Diagnosis not present

## 2019-10-05 DIAGNOSIS — I10 Essential (primary) hypertension: Secondary | ICD-10-CM | POA: Diagnosis not present

## 2019-10-05 DIAGNOSIS — Z86718 Personal history of other venous thrombosis and embolism: Secondary | ICD-10-CM | POA: Diagnosis not present

## 2019-10-05 DIAGNOSIS — Z01810 Encounter for preprocedural cardiovascular examination: Secondary | ICD-10-CM | POA: Diagnosis not present

## 2019-10-05 DIAGNOSIS — N2581 Secondary hyperparathyroidism of renal origin: Secondary | ICD-10-CM | POA: Diagnosis not present

## 2019-10-05 DIAGNOSIS — M1612 Unilateral primary osteoarthritis, left hip: Secondary | ICD-10-CM | POA: Diagnosis not present

## 2019-10-05 DIAGNOSIS — R760 Raised antibody titer: Secondary | ICD-10-CM | POA: Diagnosis not present

## 2019-10-05 DIAGNOSIS — N186 End stage renal disease: Secondary | ICD-10-CM | POA: Diagnosis not present

## 2019-10-06 DIAGNOSIS — Z992 Dependence on renal dialysis: Secondary | ICD-10-CM | POA: Diagnosis not present

## 2019-10-06 DIAGNOSIS — N186 End stage renal disease: Secondary | ICD-10-CM | POA: Diagnosis not present

## 2019-10-07 DIAGNOSIS — N186 End stage renal disease: Secondary | ICD-10-CM | POA: Diagnosis not present

## 2019-10-07 DIAGNOSIS — N2581 Secondary hyperparathyroidism of renal origin: Secondary | ICD-10-CM | POA: Diagnosis not present

## 2019-10-07 DIAGNOSIS — D509 Iron deficiency anemia, unspecified: Secondary | ICD-10-CM | POA: Diagnosis not present

## 2019-10-07 DIAGNOSIS — Z23 Encounter for immunization: Secondary | ICD-10-CM | POA: Diagnosis not present

## 2019-10-07 DIAGNOSIS — D631 Anemia in chronic kidney disease: Secondary | ICD-10-CM | POA: Diagnosis not present

## 2019-10-10 DIAGNOSIS — N186 End stage renal disease: Secondary | ICD-10-CM | POA: Diagnosis not present

## 2019-10-10 DIAGNOSIS — D631 Anemia in chronic kidney disease: Secondary | ICD-10-CM | POA: Diagnosis not present

## 2019-10-10 DIAGNOSIS — I82409 Acute embolism and thrombosis of unspecified deep veins of unspecified lower extremity: Secondary | ICD-10-CM | POA: Diagnosis not present

## 2019-10-10 DIAGNOSIS — D509 Iron deficiency anemia, unspecified: Secondary | ICD-10-CM | POA: Diagnosis not present

## 2019-10-10 DIAGNOSIS — Z23 Encounter for immunization: Secondary | ICD-10-CM | POA: Diagnosis not present

## 2019-10-10 DIAGNOSIS — N2581 Secondary hyperparathyroidism of renal origin: Secondary | ICD-10-CM | POA: Diagnosis not present

## 2019-10-12 DIAGNOSIS — N186 End stage renal disease: Secondary | ICD-10-CM | POA: Diagnosis not present

## 2019-10-12 DIAGNOSIS — D509 Iron deficiency anemia, unspecified: Secondary | ICD-10-CM | POA: Diagnosis not present

## 2019-10-12 DIAGNOSIS — Z23 Encounter for immunization: Secondary | ICD-10-CM | POA: Diagnosis not present

## 2019-10-12 DIAGNOSIS — E119 Type 2 diabetes mellitus without complications: Secondary | ICD-10-CM | POA: Diagnosis not present

## 2019-10-12 DIAGNOSIS — N2581 Secondary hyperparathyroidism of renal origin: Secondary | ICD-10-CM | POA: Diagnosis not present

## 2019-10-12 DIAGNOSIS — D631 Anemia in chronic kidney disease: Secondary | ICD-10-CM | POA: Diagnosis not present

## 2019-10-14 DIAGNOSIS — N2581 Secondary hyperparathyroidism of renal origin: Secondary | ICD-10-CM | POA: Diagnosis not present

## 2019-10-14 DIAGNOSIS — N186 End stage renal disease: Secondary | ICD-10-CM | POA: Diagnosis not present

## 2019-10-14 DIAGNOSIS — D631 Anemia in chronic kidney disease: Secondary | ICD-10-CM | POA: Diagnosis not present

## 2019-10-14 DIAGNOSIS — Z23 Encounter for immunization: Secondary | ICD-10-CM | POA: Diagnosis not present

## 2019-10-14 DIAGNOSIS — D509 Iron deficiency anemia, unspecified: Secondary | ICD-10-CM | POA: Diagnosis not present

## 2019-10-17 DIAGNOSIS — D509 Iron deficiency anemia, unspecified: Secondary | ICD-10-CM | POA: Diagnosis not present

## 2019-10-17 DIAGNOSIS — D631 Anemia in chronic kidney disease: Secondary | ICD-10-CM | POA: Diagnosis not present

## 2019-10-17 DIAGNOSIS — N186 End stage renal disease: Secondary | ICD-10-CM | POA: Diagnosis not present

## 2019-10-17 DIAGNOSIS — Z23 Encounter for immunization: Secondary | ICD-10-CM | POA: Diagnosis not present

## 2019-10-17 DIAGNOSIS — I82409 Acute embolism and thrombosis of unspecified deep veins of unspecified lower extremity: Secondary | ICD-10-CM | POA: Diagnosis not present

## 2019-10-17 DIAGNOSIS — N2581 Secondary hyperparathyroidism of renal origin: Secondary | ICD-10-CM | POA: Diagnosis not present

## 2019-10-18 DIAGNOSIS — N2581 Secondary hyperparathyroidism of renal origin: Secondary | ICD-10-CM | POA: Diagnosis not present

## 2019-10-18 DIAGNOSIS — N186 End stage renal disease: Secondary | ICD-10-CM | POA: Diagnosis not present

## 2019-10-18 DIAGNOSIS — D631 Anemia in chronic kidney disease: Secondary | ICD-10-CM | POA: Diagnosis not present

## 2019-10-18 DIAGNOSIS — D509 Iron deficiency anemia, unspecified: Secondary | ICD-10-CM | POA: Diagnosis not present

## 2019-10-18 DIAGNOSIS — Z23 Encounter for immunization: Secondary | ICD-10-CM | POA: Diagnosis not present

## 2019-10-19 DIAGNOSIS — M87352 Other secondary osteonecrosis, left femur: Secondary | ICD-10-CM | POA: Diagnosis not present

## 2019-10-19 DIAGNOSIS — D631 Anemia in chronic kidney disease: Secondary | ICD-10-CM | POA: Diagnosis not present

## 2019-10-19 DIAGNOSIS — Z471 Aftercare following joint replacement surgery: Secondary | ICD-10-CM | POA: Diagnosis not present

## 2019-10-19 DIAGNOSIS — M87052 Idiopathic aseptic necrosis of left femur: Secondary | ICD-10-CM | POA: Diagnosis not present

## 2019-10-19 DIAGNOSIS — E785 Hyperlipidemia, unspecified: Secondary | ICD-10-CM | POA: Diagnosis not present

## 2019-10-19 DIAGNOSIS — R760 Raised antibody titer: Secondary | ICD-10-CM | POA: Diagnosis not present

## 2019-10-19 DIAGNOSIS — I7300 Raynaud's syndrome without gangrene: Secondary | ICD-10-CM | POA: Diagnosis not present

## 2019-10-19 DIAGNOSIS — M329 Systemic lupus erythematosus, unspecified: Secondary | ICD-10-CM | POA: Diagnosis not present

## 2019-10-19 DIAGNOSIS — M167 Other unilateral secondary osteoarthritis of hip: Secondary | ICD-10-CM | POA: Diagnosis not present

## 2019-10-19 DIAGNOSIS — D62 Acute posthemorrhagic anemia: Secondary | ICD-10-CM | POA: Diagnosis not present

## 2019-10-19 DIAGNOSIS — Z7901 Long term (current) use of anticoagulants: Secondary | ICD-10-CM | POA: Diagnosis not present

## 2019-10-19 DIAGNOSIS — M6588 Other synovitis and tenosynovitis, other site: Secondary | ICD-10-CM | POA: Diagnosis not present

## 2019-10-19 DIAGNOSIS — Z992 Dependence on renal dialysis: Secondary | ICD-10-CM | POA: Diagnosis not present

## 2019-10-19 DIAGNOSIS — N186 End stage renal disease: Secondary | ICD-10-CM | POA: Diagnosis not present

## 2019-10-19 DIAGNOSIS — I120 Hypertensive chronic kidney disease with stage 5 chronic kidney disease or end stage renal disease: Secondary | ICD-10-CM | POA: Diagnosis not present

## 2019-10-19 DIAGNOSIS — Z86718 Personal history of other venous thrombosis and embolism: Secondary | ICD-10-CM | POA: Diagnosis not present

## 2019-10-19 DIAGNOSIS — Z96642 Presence of left artificial hip joint: Secondary | ICD-10-CM | POA: Diagnosis not present

## 2019-10-19 DIAGNOSIS — M1612 Unilateral primary osteoarthritis, left hip: Secondary | ICD-10-CM | POA: Diagnosis not present

## 2019-10-20 DIAGNOSIS — N186 End stage renal disease: Secondary | ICD-10-CM | POA: Diagnosis not present

## 2019-10-20 DIAGNOSIS — M329 Systemic lupus erythematosus, unspecified: Secondary | ICD-10-CM | POA: Diagnosis not present

## 2019-10-20 DIAGNOSIS — Z7901 Long term (current) use of anticoagulants: Secondary | ICD-10-CM | POA: Diagnosis not present

## 2019-10-20 DIAGNOSIS — Z86718 Personal history of other venous thrombosis and embolism: Secondary | ICD-10-CM | POA: Diagnosis not present

## 2019-10-20 DIAGNOSIS — R760 Raised antibody titer: Secondary | ICD-10-CM | POA: Diagnosis not present

## 2019-10-20 DIAGNOSIS — D631 Anemia in chronic kidney disease: Secondary | ICD-10-CM | POA: Diagnosis not present

## 2019-10-20 DIAGNOSIS — M1612 Unilateral primary osteoarthritis, left hip: Secondary | ICD-10-CM | POA: Diagnosis not present

## 2019-10-20 DIAGNOSIS — D62 Acute posthemorrhagic anemia: Secondary | ICD-10-CM | POA: Diagnosis not present

## 2019-10-20 DIAGNOSIS — I7300 Raynaud's syndrome without gangrene: Secondary | ICD-10-CM | POA: Diagnosis not present

## 2019-10-21 DIAGNOSIS — I7300 Raynaud's syndrome without gangrene: Secondary | ICD-10-CM | POA: Diagnosis not present

## 2019-10-21 DIAGNOSIS — Z7901 Long term (current) use of anticoagulants: Secondary | ICD-10-CM | POA: Diagnosis not present

## 2019-10-21 DIAGNOSIS — R760 Raised antibody titer: Secondary | ICD-10-CM | POA: Diagnosis not present

## 2019-10-21 DIAGNOSIS — M329 Systemic lupus erythematosus, unspecified: Secondary | ICD-10-CM | POA: Diagnosis not present

## 2019-10-21 DIAGNOSIS — Z86718 Personal history of other venous thrombosis and embolism: Secondary | ICD-10-CM | POA: Diagnosis not present

## 2019-10-21 DIAGNOSIS — N186 End stage renal disease: Secondary | ICD-10-CM | POA: Diagnosis not present

## 2019-10-21 DIAGNOSIS — M1612 Unilateral primary osteoarthritis, left hip: Secondary | ICD-10-CM | POA: Diagnosis not present

## 2019-10-22 DIAGNOSIS — M329 Systemic lupus erythematosus, unspecified: Secondary | ICD-10-CM | POA: Diagnosis not present

## 2019-10-22 DIAGNOSIS — I7300 Raynaud's syndrome without gangrene: Secondary | ICD-10-CM | POA: Diagnosis not present

## 2019-10-22 DIAGNOSIS — M167 Other unilateral secondary osteoarthritis of hip: Secondary | ICD-10-CM | POA: Diagnosis not present

## 2019-10-22 DIAGNOSIS — M1612 Unilateral primary osteoarthritis, left hip: Secondary | ICD-10-CM | POA: Diagnosis not present

## 2019-10-22 DIAGNOSIS — Z7901 Long term (current) use of anticoagulants: Secondary | ICD-10-CM | POA: Diagnosis not present

## 2019-10-22 DIAGNOSIS — Z992 Dependence on renal dialysis: Secondary | ICD-10-CM | POA: Diagnosis not present

## 2019-10-22 DIAGNOSIS — Z86718 Personal history of other venous thrombosis and embolism: Secondary | ICD-10-CM | POA: Diagnosis not present

## 2019-10-22 DIAGNOSIS — M87052 Idiopathic aseptic necrosis of left femur: Secondary | ICD-10-CM | POA: Diagnosis not present

## 2019-10-22 DIAGNOSIS — N186 End stage renal disease: Secondary | ICD-10-CM | POA: Diagnosis not present

## 2019-10-22 DIAGNOSIS — R760 Raised antibody titer: Secondary | ICD-10-CM | POA: Diagnosis not present

## 2019-10-24 DIAGNOSIS — D509 Iron deficiency anemia, unspecified: Secondary | ICD-10-CM | POA: Diagnosis not present

## 2019-10-24 DIAGNOSIS — N2581 Secondary hyperparathyroidism of renal origin: Secondary | ICD-10-CM | POA: Diagnosis not present

## 2019-10-24 DIAGNOSIS — Z23 Encounter for immunization: Secondary | ICD-10-CM | POA: Diagnosis not present

## 2019-10-24 DIAGNOSIS — I82409 Acute embolism and thrombosis of unspecified deep veins of unspecified lower extremity: Secondary | ICD-10-CM | POA: Diagnosis not present

## 2019-10-24 DIAGNOSIS — D631 Anemia in chronic kidney disease: Secondary | ICD-10-CM | POA: Diagnosis not present

## 2019-10-24 DIAGNOSIS — N186 End stage renal disease: Secondary | ICD-10-CM | POA: Diagnosis not present

## 2019-10-25 DIAGNOSIS — N186 End stage renal disease: Secondary | ICD-10-CM | POA: Diagnosis not present

## 2019-10-25 DIAGNOSIS — I7300 Raynaud's syndrome without gangrene: Secondary | ICD-10-CM | POA: Diagnosis not present

## 2019-10-25 DIAGNOSIS — I120 Hypertensive chronic kidney disease with stage 5 chronic kidney disease or end stage renal disease: Secondary | ICD-10-CM | POA: Diagnosis not present

## 2019-10-25 DIAGNOSIS — M150 Primary generalized (osteo)arthritis: Secondary | ICD-10-CM | POA: Diagnosis not present

## 2019-10-25 DIAGNOSIS — E1151 Type 2 diabetes mellitus with diabetic peripheral angiopathy without gangrene: Secondary | ICD-10-CM | POA: Diagnosis not present

## 2019-10-25 DIAGNOSIS — M329 Systemic lupus erythematosus, unspecified: Secondary | ICD-10-CM | POA: Diagnosis not present

## 2019-10-25 DIAGNOSIS — D631 Anemia in chronic kidney disease: Secondary | ICD-10-CM | POA: Diagnosis not present

## 2019-10-25 DIAGNOSIS — I2789 Other specified pulmonary heart diseases: Secondary | ICD-10-CM | POA: Diagnosis not present

## 2019-10-25 DIAGNOSIS — Z471 Aftercare following joint replacement surgery: Secondary | ICD-10-CM | POA: Diagnosis not present

## 2019-10-26 DIAGNOSIS — D509 Iron deficiency anemia, unspecified: Secondary | ICD-10-CM | POA: Diagnosis not present

## 2019-10-26 DIAGNOSIS — N2581 Secondary hyperparathyroidism of renal origin: Secondary | ICD-10-CM | POA: Diagnosis not present

## 2019-10-26 DIAGNOSIS — N186 End stage renal disease: Secondary | ICD-10-CM | POA: Diagnosis not present

## 2019-10-26 DIAGNOSIS — Z23 Encounter for immunization: Secondary | ICD-10-CM | POA: Diagnosis not present

## 2019-10-26 DIAGNOSIS — D631 Anemia in chronic kidney disease: Secondary | ICD-10-CM | POA: Diagnosis not present

## 2019-10-28 DIAGNOSIS — N186 End stage renal disease: Secondary | ICD-10-CM | POA: Diagnosis not present

## 2019-10-28 DIAGNOSIS — Z23 Encounter for immunization: Secondary | ICD-10-CM | POA: Diagnosis not present

## 2019-10-28 DIAGNOSIS — N2581 Secondary hyperparathyroidism of renal origin: Secondary | ICD-10-CM | POA: Diagnosis not present

## 2019-10-28 DIAGNOSIS — D631 Anemia in chronic kidney disease: Secondary | ICD-10-CM | POA: Diagnosis not present

## 2019-10-28 DIAGNOSIS — D509 Iron deficiency anemia, unspecified: Secondary | ICD-10-CM | POA: Diagnosis not present

## 2019-10-31 DIAGNOSIS — N186 End stage renal disease: Secondary | ICD-10-CM | POA: Diagnosis not present

## 2019-10-31 DIAGNOSIS — N2581 Secondary hyperparathyroidism of renal origin: Secondary | ICD-10-CM | POA: Diagnosis not present

## 2019-10-31 DIAGNOSIS — D509 Iron deficiency anemia, unspecified: Secondary | ICD-10-CM | POA: Diagnosis not present

## 2019-10-31 DIAGNOSIS — Z23 Encounter for immunization: Secondary | ICD-10-CM | POA: Diagnosis not present

## 2019-10-31 DIAGNOSIS — I82409 Acute embolism and thrombosis of unspecified deep veins of unspecified lower extremity: Secondary | ICD-10-CM | POA: Diagnosis not present

## 2019-10-31 DIAGNOSIS — D631 Anemia in chronic kidney disease: Secondary | ICD-10-CM | POA: Diagnosis not present

## 2019-11-02 DIAGNOSIS — N186 End stage renal disease: Secondary | ICD-10-CM | POA: Diagnosis not present

## 2019-11-02 DIAGNOSIS — D509 Iron deficiency anemia, unspecified: Secondary | ICD-10-CM | POA: Diagnosis not present

## 2019-11-02 DIAGNOSIS — N2581 Secondary hyperparathyroidism of renal origin: Secondary | ICD-10-CM | POA: Diagnosis not present

## 2019-11-02 DIAGNOSIS — Z23 Encounter for immunization: Secondary | ICD-10-CM | POA: Diagnosis not present

## 2019-11-02 DIAGNOSIS — D631 Anemia in chronic kidney disease: Secondary | ICD-10-CM | POA: Diagnosis not present

## 2019-11-03 DIAGNOSIS — I7300 Raynaud's syndrome without gangrene: Secondary | ICD-10-CM | POA: Diagnosis not present

## 2019-11-03 DIAGNOSIS — I120 Hypertensive chronic kidney disease with stage 5 chronic kidney disease or end stage renal disease: Secondary | ICD-10-CM | POA: Diagnosis not present

## 2019-11-03 DIAGNOSIS — N186 End stage renal disease: Secondary | ICD-10-CM | POA: Diagnosis not present

## 2019-11-03 DIAGNOSIS — Z471 Aftercare following joint replacement surgery: Secondary | ICD-10-CM | POA: Diagnosis not present

## 2019-11-03 DIAGNOSIS — D631 Anemia in chronic kidney disease: Secondary | ICD-10-CM | POA: Diagnosis not present

## 2019-11-03 DIAGNOSIS — M150 Primary generalized (osteo)arthritis: Secondary | ICD-10-CM | POA: Diagnosis not present

## 2019-11-03 DIAGNOSIS — E1151 Type 2 diabetes mellitus with diabetic peripheral angiopathy without gangrene: Secondary | ICD-10-CM | POA: Diagnosis not present

## 2019-11-03 DIAGNOSIS — M329 Systemic lupus erythematosus, unspecified: Secondary | ICD-10-CM | POA: Diagnosis not present

## 2019-11-03 DIAGNOSIS — I2789 Other specified pulmonary heart diseases: Secondary | ICD-10-CM | POA: Diagnosis not present

## 2019-11-04 DIAGNOSIS — D631 Anemia in chronic kidney disease: Secondary | ICD-10-CM | POA: Diagnosis not present

## 2019-11-04 DIAGNOSIS — N186 End stage renal disease: Secondary | ICD-10-CM | POA: Diagnosis not present

## 2019-11-04 DIAGNOSIS — Z23 Encounter for immunization: Secondary | ICD-10-CM | POA: Diagnosis not present

## 2019-11-04 DIAGNOSIS — N2581 Secondary hyperparathyroidism of renal origin: Secondary | ICD-10-CM | POA: Diagnosis not present

## 2019-11-04 DIAGNOSIS — D509 Iron deficiency anemia, unspecified: Secondary | ICD-10-CM | POA: Diagnosis not present

## 2019-11-05 DIAGNOSIS — M150 Primary generalized (osteo)arthritis: Secondary | ICD-10-CM | POA: Diagnosis not present

## 2019-11-05 DIAGNOSIS — I7300 Raynaud's syndrome without gangrene: Secondary | ICD-10-CM | POA: Diagnosis not present

## 2019-11-05 DIAGNOSIS — Z471 Aftercare following joint replacement surgery: Secondary | ICD-10-CM | POA: Diagnosis not present

## 2019-11-05 DIAGNOSIS — E1151 Type 2 diabetes mellitus with diabetic peripheral angiopathy without gangrene: Secondary | ICD-10-CM | POA: Diagnosis not present

## 2019-11-05 DIAGNOSIS — I2789 Other specified pulmonary heart diseases: Secondary | ICD-10-CM | POA: Diagnosis not present

## 2019-11-05 DIAGNOSIS — D631 Anemia in chronic kidney disease: Secondary | ICD-10-CM | POA: Diagnosis not present

## 2019-11-05 DIAGNOSIS — N186 End stage renal disease: Secondary | ICD-10-CM | POA: Diagnosis not present

## 2019-11-05 DIAGNOSIS — M329 Systemic lupus erythematosus, unspecified: Secondary | ICD-10-CM | POA: Diagnosis not present

## 2019-11-05 DIAGNOSIS — I120 Hypertensive chronic kidney disease with stage 5 chronic kidney disease or end stage renal disease: Secondary | ICD-10-CM | POA: Diagnosis not present

## 2019-11-06 DIAGNOSIS — N186 End stage renal disease: Secondary | ICD-10-CM | POA: Diagnosis not present

## 2019-11-06 DIAGNOSIS — Z7901 Long term (current) use of anticoagulants: Secondary | ICD-10-CM | POA: Diagnosis not present

## 2019-11-06 DIAGNOSIS — R195 Other fecal abnormalities: Secondary | ICD-10-CM | POA: Diagnosis not present

## 2019-11-06 DIAGNOSIS — Z888 Allergy status to other drugs, medicaments and biological substances status: Secondary | ICD-10-CM | POA: Diagnosis not present

## 2019-11-06 DIAGNOSIS — Z91013 Allergy to seafood: Secondary | ICD-10-CM | POA: Diagnosis not present

## 2019-11-06 DIAGNOSIS — K921 Melena: Secondary | ICD-10-CM | POA: Diagnosis not present

## 2019-11-06 DIAGNOSIS — Z992 Dependence on renal dialysis: Secondary | ICD-10-CM | POA: Diagnosis not present

## 2019-11-06 DIAGNOSIS — Z86718 Personal history of other venous thrombosis and embolism: Secondary | ICD-10-CM | POA: Diagnosis not present

## 2019-11-06 DIAGNOSIS — D649 Anemia, unspecified: Secondary | ICD-10-CM | POA: Diagnosis not present

## 2019-11-06 DIAGNOSIS — I120 Hypertensive chronic kidney disease with stage 5 chronic kidney disease or end stage renal disease: Secondary | ICD-10-CM | POA: Diagnosis not present

## 2019-11-07 DIAGNOSIS — N186 End stage renal disease: Secondary | ICD-10-CM | POA: Diagnosis not present

## 2019-11-07 DIAGNOSIS — D631 Anemia in chronic kidney disease: Secondary | ICD-10-CM | POA: Diagnosis not present

## 2019-11-07 DIAGNOSIS — D509 Iron deficiency anemia, unspecified: Secondary | ICD-10-CM | POA: Diagnosis not present

## 2019-11-07 DIAGNOSIS — I82409 Acute embolism and thrombosis of unspecified deep veins of unspecified lower extremity: Secondary | ICD-10-CM | POA: Diagnosis not present

## 2019-11-07 DIAGNOSIS — N2581 Secondary hyperparathyroidism of renal origin: Secondary | ICD-10-CM | POA: Diagnosis not present

## 2019-11-08 DIAGNOSIS — D631 Anemia in chronic kidney disease: Secondary | ICD-10-CM | POA: Diagnosis not present

## 2019-11-08 DIAGNOSIS — N186 End stage renal disease: Secondary | ICD-10-CM | POA: Diagnosis not present

## 2019-11-08 DIAGNOSIS — I7300 Raynaud's syndrome without gangrene: Secondary | ICD-10-CM | POA: Diagnosis not present

## 2019-11-08 DIAGNOSIS — I2789 Other specified pulmonary heart diseases: Secondary | ICD-10-CM | POA: Diagnosis not present

## 2019-11-08 DIAGNOSIS — M329 Systemic lupus erythematosus, unspecified: Secondary | ICD-10-CM | POA: Diagnosis not present

## 2019-11-08 DIAGNOSIS — I120 Hypertensive chronic kidney disease with stage 5 chronic kidney disease or end stage renal disease: Secondary | ICD-10-CM | POA: Diagnosis not present

## 2019-11-08 DIAGNOSIS — Z471 Aftercare following joint replacement surgery: Secondary | ICD-10-CM | POA: Diagnosis not present

## 2019-11-08 DIAGNOSIS — M150 Primary generalized (osteo)arthritis: Secondary | ICD-10-CM | POA: Diagnosis not present

## 2019-11-08 DIAGNOSIS — E1151 Type 2 diabetes mellitus with diabetic peripheral angiopathy without gangrene: Secondary | ICD-10-CM | POA: Diagnosis not present

## 2019-11-09 DIAGNOSIS — N2581 Secondary hyperparathyroidism of renal origin: Secondary | ICD-10-CM | POA: Diagnosis not present

## 2019-11-09 DIAGNOSIS — D631 Anemia in chronic kidney disease: Secondary | ICD-10-CM | POA: Diagnosis not present

## 2019-11-09 DIAGNOSIS — D509 Iron deficiency anemia, unspecified: Secondary | ICD-10-CM | POA: Diagnosis not present

## 2019-11-09 DIAGNOSIS — N186 End stage renal disease: Secondary | ICD-10-CM | POA: Diagnosis not present

## 2019-11-09 DIAGNOSIS — E119 Type 2 diabetes mellitus without complications: Secondary | ICD-10-CM | POA: Diagnosis not present

## 2019-11-10 DIAGNOSIS — I2789 Other specified pulmonary heart diseases: Secondary | ICD-10-CM | POA: Diagnosis not present

## 2019-11-10 DIAGNOSIS — Z471 Aftercare following joint replacement surgery: Secondary | ICD-10-CM | POA: Diagnosis not present

## 2019-11-10 DIAGNOSIS — I120 Hypertensive chronic kidney disease with stage 5 chronic kidney disease or end stage renal disease: Secondary | ICD-10-CM | POA: Diagnosis not present

## 2019-11-10 DIAGNOSIS — D631 Anemia in chronic kidney disease: Secondary | ICD-10-CM | POA: Diagnosis not present

## 2019-11-10 DIAGNOSIS — M329 Systemic lupus erythematosus, unspecified: Secondary | ICD-10-CM | POA: Diagnosis not present

## 2019-11-10 DIAGNOSIS — M150 Primary generalized (osteo)arthritis: Secondary | ICD-10-CM | POA: Diagnosis not present

## 2019-11-10 DIAGNOSIS — E1151 Type 2 diabetes mellitus with diabetic peripheral angiopathy without gangrene: Secondary | ICD-10-CM | POA: Diagnosis not present

## 2019-11-10 DIAGNOSIS — N186 End stage renal disease: Secondary | ICD-10-CM | POA: Diagnosis not present

## 2019-11-10 DIAGNOSIS — I7300 Raynaud's syndrome without gangrene: Secondary | ICD-10-CM | POA: Diagnosis not present

## 2019-11-11 DIAGNOSIS — D509 Iron deficiency anemia, unspecified: Secondary | ICD-10-CM | POA: Diagnosis not present

## 2019-11-11 DIAGNOSIS — D631 Anemia in chronic kidney disease: Secondary | ICD-10-CM | POA: Diagnosis not present

## 2019-11-11 DIAGNOSIS — N2581 Secondary hyperparathyroidism of renal origin: Secondary | ICD-10-CM | POA: Diagnosis not present

## 2019-11-11 DIAGNOSIS — N186 End stage renal disease: Secondary | ICD-10-CM | POA: Diagnosis not present

## 2019-11-14 DIAGNOSIS — N2581 Secondary hyperparathyroidism of renal origin: Secondary | ICD-10-CM | POA: Diagnosis not present

## 2019-11-14 DIAGNOSIS — D509 Iron deficiency anemia, unspecified: Secondary | ICD-10-CM | POA: Diagnosis not present

## 2019-11-14 DIAGNOSIS — I82409 Acute embolism and thrombosis of unspecified deep veins of unspecified lower extremity: Secondary | ICD-10-CM | POA: Diagnosis not present

## 2019-11-14 DIAGNOSIS — N186 End stage renal disease: Secondary | ICD-10-CM | POA: Diagnosis not present

## 2019-11-14 DIAGNOSIS — D631 Anemia in chronic kidney disease: Secondary | ICD-10-CM | POA: Diagnosis not present

## 2019-11-15 DIAGNOSIS — Z96642 Presence of left artificial hip joint: Secondary | ICD-10-CM | POA: Diagnosis not present

## 2019-11-15 DIAGNOSIS — Z471 Aftercare following joint replacement surgery: Secondary | ICD-10-CM | POA: Diagnosis not present

## 2019-11-15 DIAGNOSIS — M329 Systemic lupus erythematosus, unspecified: Secondary | ICD-10-CM | POA: Diagnosis not present

## 2019-11-15 DIAGNOSIS — M25552 Pain in left hip: Secondary | ICD-10-CM | POA: Diagnosis not present

## 2019-11-15 DIAGNOSIS — N186 End stage renal disease: Secondary | ICD-10-CM | POA: Diagnosis not present

## 2019-11-15 DIAGNOSIS — I120 Hypertensive chronic kidney disease with stage 5 chronic kidney disease or end stage renal disease: Secondary | ICD-10-CM | POA: Diagnosis not present

## 2019-11-15 DIAGNOSIS — M150 Primary generalized (osteo)arthritis: Secondary | ICD-10-CM | POA: Diagnosis not present

## 2019-11-15 DIAGNOSIS — I2789 Other specified pulmonary heart diseases: Secondary | ICD-10-CM | POA: Diagnosis not present

## 2019-11-15 DIAGNOSIS — E1151 Type 2 diabetes mellitus with diabetic peripheral angiopathy without gangrene: Secondary | ICD-10-CM | POA: Diagnosis not present

## 2019-11-15 DIAGNOSIS — I7300 Raynaud's syndrome without gangrene: Secondary | ICD-10-CM | POA: Diagnosis not present

## 2019-11-15 DIAGNOSIS — D631 Anemia in chronic kidney disease: Secondary | ICD-10-CM | POA: Diagnosis not present

## 2019-11-16 DIAGNOSIS — N186 End stage renal disease: Secondary | ICD-10-CM | POA: Diagnosis not present

## 2019-11-16 DIAGNOSIS — D509 Iron deficiency anemia, unspecified: Secondary | ICD-10-CM | POA: Diagnosis not present

## 2019-11-16 DIAGNOSIS — D631 Anemia in chronic kidney disease: Secondary | ICD-10-CM | POA: Diagnosis not present

## 2019-11-16 DIAGNOSIS — N2581 Secondary hyperparathyroidism of renal origin: Secondary | ICD-10-CM | POA: Diagnosis not present

## 2019-11-17 DIAGNOSIS — E1151 Type 2 diabetes mellitus with diabetic peripheral angiopathy without gangrene: Secondary | ICD-10-CM | POA: Diagnosis not present

## 2019-11-17 DIAGNOSIS — I2789 Other specified pulmonary heart diseases: Secondary | ICD-10-CM | POA: Diagnosis not present

## 2019-11-17 DIAGNOSIS — N186 End stage renal disease: Secondary | ICD-10-CM | POA: Diagnosis not present

## 2019-11-17 DIAGNOSIS — D631 Anemia in chronic kidney disease: Secondary | ICD-10-CM | POA: Diagnosis not present

## 2019-11-17 DIAGNOSIS — M150 Primary generalized (osteo)arthritis: Secondary | ICD-10-CM | POA: Diagnosis not present

## 2019-11-17 DIAGNOSIS — I7300 Raynaud's syndrome without gangrene: Secondary | ICD-10-CM | POA: Diagnosis not present

## 2019-11-17 DIAGNOSIS — I120 Hypertensive chronic kidney disease with stage 5 chronic kidney disease or end stage renal disease: Secondary | ICD-10-CM | POA: Diagnosis not present

## 2019-11-17 DIAGNOSIS — Z471 Aftercare following joint replacement surgery: Secondary | ICD-10-CM | POA: Diagnosis not present

## 2019-11-17 DIAGNOSIS — M329 Systemic lupus erythematosus, unspecified: Secondary | ICD-10-CM | POA: Diagnosis not present

## 2019-11-18 DIAGNOSIS — N186 End stage renal disease: Secondary | ICD-10-CM | POA: Diagnosis not present

## 2019-11-18 DIAGNOSIS — D509 Iron deficiency anemia, unspecified: Secondary | ICD-10-CM | POA: Diagnosis not present

## 2019-11-18 DIAGNOSIS — D631 Anemia in chronic kidney disease: Secondary | ICD-10-CM | POA: Diagnosis not present

## 2019-11-18 DIAGNOSIS — N2581 Secondary hyperparathyroidism of renal origin: Secondary | ICD-10-CM | POA: Diagnosis not present

## 2019-11-21 DIAGNOSIS — I82409 Acute embolism and thrombosis of unspecified deep veins of unspecified lower extremity: Secondary | ICD-10-CM | POA: Diagnosis not present

## 2019-11-21 DIAGNOSIS — N2581 Secondary hyperparathyroidism of renal origin: Secondary | ICD-10-CM | POA: Diagnosis not present

## 2019-11-21 DIAGNOSIS — N186 End stage renal disease: Secondary | ICD-10-CM | POA: Diagnosis not present

## 2019-11-21 DIAGNOSIS — D631 Anemia in chronic kidney disease: Secondary | ICD-10-CM | POA: Diagnosis not present

## 2019-11-21 DIAGNOSIS — D509 Iron deficiency anemia, unspecified: Secondary | ICD-10-CM | POA: Diagnosis not present

## 2019-11-22 DIAGNOSIS — N186 End stage renal disease: Secondary | ICD-10-CM | POA: Diagnosis not present

## 2019-11-22 DIAGNOSIS — Z471 Aftercare following joint replacement surgery: Secondary | ICD-10-CM | POA: Diagnosis not present

## 2019-11-22 DIAGNOSIS — M329 Systemic lupus erythematosus, unspecified: Secondary | ICD-10-CM | POA: Diagnosis not present

## 2019-11-22 DIAGNOSIS — I2789 Other specified pulmonary heart diseases: Secondary | ICD-10-CM | POA: Diagnosis not present

## 2019-11-22 DIAGNOSIS — I7300 Raynaud's syndrome without gangrene: Secondary | ICD-10-CM | POA: Diagnosis not present

## 2019-11-22 DIAGNOSIS — M150 Primary generalized (osteo)arthritis: Secondary | ICD-10-CM | POA: Diagnosis not present

## 2019-11-22 DIAGNOSIS — E1151 Type 2 diabetes mellitus with diabetic peripheral angiopathy without gangrene: Secondary | ICD-10-CM | POA: Diagnosis not present

## 2019-11-22 DIAGNOSIS — D631 Anemia in chronic kidney disease: Secondary | ICD-10-CM | POA: Diagnosis not present

## 2019-11-22 DIAGNOSIS — I120 Hypertensive chronic kidney disease with stage 5 chronic kidney disease or end stage renal disease: Secondary | ICD-10-CM | POA: Diagnosis not present

## 2019-11-23 DIAGNOSIS — D631 Anemia in chronic kidney disease: Secondary | ICD-10-CM | POA: Diagnosis not present

## 2019-11-23 DIAGNOSIS — D509 Iron deficiency anemia, unspecified: Secondary | ICD-10-CM | POA: Diagnosis not present

## 2019-11-23 DIAGNOSIS — N186 End stage renal disease: Secondary | ICD-10-CM | POA: Diagnosis not present

## 2019-11-23 DIAGNOSIS — N2581 Secondary hyperparathyroidism of renal origin: Secondary | ICD-10-CM | POA: Diagnosis not present

## 2019-11-24 DIAGNOSIS — E1151 Type 2 diabetes mellitus with diabetic peripheral angiopathy without gangrene: Secondary | ICD-10-CM | POA: Diagnosis not present

## 2019-11-24 DIAGNOSIS — I7300 Raynaud's syndrome without gangrene: Secondary | ICD-10-CM | POA: Diagnosis not present

## 2019-11-24 DIAGNOSIS — N186 End stage renal disease: Secondary | ICD-10-CM | POA: Diagnosis not present

## 2019-11-24 DIAGNOSIS — M150 Primary generalized (osteo)arthritis: Secondary | ICD-10-CM | POA: Diagnosis not present

## 2019-11-24 DIAGNOSIS — D631 Anemia in chronic kidney disease: Secondary | ICD-10-CM | POA: Diagnosis not present

## 2019-11-24 DIAGNOSIS — M329 Systemic lupus erythematosus, unspecified: Secondary | ICD-10-CM | POA: Diagnosis not present

## 2019-11-24 DIAGNOSIS — Z471 Aftercare following joint replacement surgery: Secondary | ICD-10-CM | POA: Diagnosis not present

## 2019-11-24 DIAGNOSIS — I120 Hypertensive chronic kidney disease with stage 5 chronic kidney disease or end stage renal disease: Secondary | ICD-10-CM | POA: Diagnosis not present

## 2019-11-24 DIAGNOSIS — I2789 Other specified pulmonary heart diseases: Secondary | ICD-10-CM | POA: Diagnosis not present

## 2019-11-25 DIAGNOSIS — N2581 Secondary hyperparathyroidism of renal origin: Secondary | ICD-10-CM | POA: Diagnosis not present

## 2019-11-25 DIAGNOSIS — N186 End stage renal disease: Secondary | ICD-10-CM | POA: Diagnosis not present

## 2019-11-25 DIAGNOSIS — D509 Iron deficiency anemia, unspecified: Secondary | ICD-10-CM | POA: Diagnosis not present

## 2019-11-25 DIAGNOSIS — D631 Anemia in chronic kidney disease: Secondary | ICD-10-CM | POA: Diagnosis not present

## 2019-11-27 DIAGNOSIS — N2581 Secondary hyperparathyroidism of renal origin: Secondary | ICD-10-CM | POA: Diagnosis not present

## 2019-11-27 DIAGNOSIS — D631 Anemia in chronic kidney disease: Secondary | ICD-10-CM | POA: Diagnosis not present

## 2019-11-27 DIAGNOSIS — N186 End stage renal disease: Secondary | ICD-10-CM | POA: Diagnosis not present

## 2019-11-27 DIAGNOSIS — I82409 Acute embolism and thrombosis of unspecified deep veins of unspecified lower extremity: Secondary | ICD-10-CM | POA: Diagnosis not present

## 2019-11-27 DIAGNOSIS — D509 Iron deficiency anemia, unspecified: Secondary | ICD-10-CM | POA: Diagnosis not present

## 2019-11-28 DIAGNOSIS — E1151 Type 2 diabetes mellitus with diabetic peripheral angiopathy without gangrene: Secondary | ICD-10-CM | POA: Diagnosis not present

## 2019-11-28 DIAGNOSIS — M150 Primary generalized (osteo)arthritis: Secondary | ICD-10-CM | POA: Diagnosis not present

## 2019-11-28 DIAGNOSIS — D631 Anemia in chronic kidney disease: Secondary | ICD-10-CM | POA: Diagnosis not present

## 2019-11-28 DIAGNOSIS — M329 Systemic lupus erythematosus, unspecified: Secondary | ICD-10-CM | POA: Diagnosis not present

## 2019-11-28 DIAGNOSIS — I7300 Raynaud's syndrome without gangrene: Secondary | ICD-10-CM | POA: Diagnosis not present

## 2019-11-28 DIAGNOSIS — N186 End stage renal disease: Secondary | ICD-10-CM | POA: Diagnosis not present

## 2019-11-28 DIAGNOSIS — Z471 Aftercare following joint replacement surgery: Secondary | ICD-10-CM | POA: Diagnosis not present

## 2019-11-28 DIAGNOSIS — I120 Hypertensive chronic kidney disease with stage 5 chronic kidney disease or end stage renal disease: Secondary | ICD-10-CM | POA: Diagnosis not present

## 2019-11-28 DIAGNOSIS — I2789 Other specified pulmonary heart diseases: Secondary | ICD-10-CM | POA: Diagnosis not present

## 2019-11-29 DIAGNOSIS — D631 Anemia in chronic kidney disease: Secondary | ICD-10-CM | POA: Diagnosis not present

## 2019-11-29 DIAGNOSIS — N2581 Secondary hyperparathyroidism of renal origin: Secondary | ICD-10-CM | POA: Diagnosis not present

## 2019-11-29 DIAGNOSIS — D509 Iron deficiency anemia, unspecified: Secondary | ICD-10-CM | POA: Diagnosis not present

## 2019-11-29 DIAGNOSIS — N186 End stage renal disease: Secondary | ICD-10-CM | POA: Diagnosis not present

## 2019-11-30 DIAGNOSIS — M150 Primary generalized (osteo)arthritis: Secondary | ICD-10-CM | POA: Diagnosis not present

## 2019-11-30 DIAGNOSIS — E1151 Type 2 diabetes mellitus with diabetic peripheral angiopathy without gangrene: Secondary | ICD-10-CM | POA: Diagnosis not present

## 2019-11-30 DIAGNOSIS — D631 Anemia in chronic kidney disease: Secondary | ICD-10-CM | POA: Diagnosis not present

## 2019-11-30 DIAGNOSIS — I2789 Other specified pulmonary heart diseases: Secondary | ICD-10-CM | POA: Diagnosis not present

## 2019-11-30 DIAGNOSIS — M329 Systemic lupus erythematosus, unspecified: Secondary | ICD-10-CM | POA: Diagnosis not present

## 2019-11-30 DIAGNOSIS — Z471 Aftercare following joint replacement surgery: Secondary | ICD-10-CM | POA: Diagnosis not present

## 2019-11-30 DIAGNOSIS — N186 End stage renal disease: Secondary | ICD-10-CM | POA: Diagnosis not present

## 2019-11-30 DIAGNOSIS — I7300 Raynaud's syndrome without gangrene: Secondary | ICD-10-CM | POA: Diagnosis not present

## 2019-11-30 DIAGNOSIS — I120 Hypertensive chronic kidney disease with stage 5 chronic kidney disease or end stage renal disease: Secondary | ICD-10-CM | POA: Diagnosis not present

## 2019-12-02 DIAGNOSIS — N2581 Secondary hyperparathyroidism of renal origin: Secondary | ICD-10-CM | POA: Diagnosis not present

## 2019-12-02 DIAGNOSIS — D509 Iron deficiency anemia, unspecified: Secondary | ICD-10-CM | POA: Diagnosis not present

## 2019-12-02 DIAGNOSIS — N186 End stage renal disease: Secondary | ICD-10-CM | POA: Diagnosis not present

## 2019-12-02 DIAGNOSIS — D631 Anemia in chronic kidney disease: Secondary | ICD-10-CM | POA: Diagnosis not present

## 2019-12-05 DIAGNOSIS — N186 End stage renal disease: Secondary | ICD-10-CM | POA: Diagnosis not present

## 2019-12-05 DIAGNOSIS — N2581 Secondary hyperparathyroidism of renal origin: Secondary | ICD-10-CM | POA: Diagnosis not present

## 2019-12-05 DIAGNOSIS — D509 Iron deficiency anemia, unspecified: Secondary | ICD-10-CM | POA: Diagnosis not present

## 2019-12-05 DIAGNOSIS — D631 Anemia in chronic kidney disease: Secondary | ICD-10-CM | POA: Diagnosis not present

## 2019-12-05 DIAGNOSIS — I82409 Acute embolism and thrombosis of unspecified deep veins of unspecified lower extremity: Secondary | ICD-10-CM | POA: Diagnosis not present

## 2019-12-06 DIAGNOSIS — Z992 Dependence on renal dialysis: Secondary | ICD-10-CM | POA: Diagnosis not present

## 2019-12-06 DIAGNOSIS — N186 End stage renal disease: Secondary | ICD-10-CM | POA: Diagnosis not present

## 2019-12-07 DIAGNOSIS — N2581 Secondary hyperparathyroidism of renal origin: Secondary | ICD-10-CM | POA: Diagnosis not present

## 2019-12-07 DIAGNOSIS — E119 Type 2 diabetes mellitus without complications: Secondary | ICD-10-CM | POA: Diagnosis not present

## 2019-12-07 DIAGNOSIS — D509 Iron deficiency anemia, unspecified: Secondary | ICD-10-CM | POA: Diagnosis not present

## 2019-12-07 DIAGNOSIS — D631 Anemia in chronic kidney disease: Secondary | ICD-10-CM | POA: Diagnosis not present

## 2019-12-07 DIAGNOSIS — N186 End stage renal disease: Secondary | ICD-10-CM | POA: Diagnosis not present

## 2019-12-09 DIAGNOSIS — N2581 Secondary hyperparathyroidism of renal origin: Secondary | ICD-10-CM | POA: Diagnosis not present

## 2019-12-09 DIAGNOSIS — D631 Anemia in chronic kidney disease: Secondary | ICD-10-CM | POA: Diagnosis not present

## 2019-12-09 DIAGNOSIS — D509 Iron deficiency anemia, unspecified: Secondary | ICD-10-CM | POA: Diagnosis not present

## 2019-12-09 DIAGNOSIS — N186 End stage renal disease: Secondary | ICD-10-CM | POA: Diagnosis not present

## 2019-12-12 DIAGNOSIS — I82409 Acute embolism and thrombosis of unspecified deep veins of unspecified lower extremity: Secondary | ICD-10-CM | POA: Diagnosis not present

## 2019-12-12 DIAGNOSIS — D631 Anemia in chronic kidney disease: Secondary | ICD-10-CM | POA: Diagnosis not present

## 2019-12-12 DIAGNOSIS — N2581 Secondary hyperparathyroidism of renal origin: Secondary | ICD-10-CM | POA: Diagnosis not present

## 2019-12-12 DIAGNOSIS — N186 End stage renal disease: Secondary | ICD-10-CM | POA: Diagnosis not present

## 2019-12-12 DIAGNOSIS — D509 Iron deficiency anemia, unspecified: Secondary | ICD-10-CM | POA: Diagnosis not present

## 2019-12-13 DIAGNOSIS — Z471 Aftercare following joint replacement surgery: Secondary | ICD-10-CM | POA: Diagnosis not present

## 2019-12-13 DIAGNOSIS — Z96643 Presence of artificial hip joint, bilateral: Secondary | ICD-10-CM | POA: Diagnosis not present

## 2019-12-13 DIAGNOSIS — Z96642 Presence of left artificial hip joint: Secondary | ICD-10-CM | POA: Diagnosis not present

## 2019-12-14 DIAGNOSIS — N186 End stage renal disease: Secondary | ICD-10-CM | POA: Diagnosis not present

## 2019-12-14 DIAGNOSIS — D509 Iron deficiency anemia, unspecified: Secondary | ICD-10-CM | POA: Diagnosis not present

## 2019-12-14 DIAGNOSIS — D631 Anemia in chronic kidney disease: Secondary | ICD-10-CM | POA: Diagnosis not present

## 2019-12-14 DIAGNOSIS — N2581 Secondary hyperparathyroidism of renal origin: Secondary | ICD-10-CM | POA: Diagnosis not present

## 2019-12-16 DIAGNOSIS — N2581 Secondary hyperparathyroidism of renal origin: Secondary | ICD-10-CM | POA: Diagnosis not present

## 2019-12-16 DIAGNOSIS — N186 End stage renal disease: Secondary | ICD-10-CM | POA: Diagnosis not present

## 2019-12-16 DIAGNOSIS — D509 Iron deficiency anemia, unspecified: Secondary | ICD-10-CM | POA: Diagnosis not present

## 2019-12-16 DIAGNOSIS — D631 Anemia in chronic kidney disease: Secondary | ICD-10-CM | POA: Diagnosis not present

## 2019-12-19 DIAGNOSIS — I82409 Acute embolism and thrombosis of unspecified deep veins of unspecified lower extremity: Secondary | ICD-10-CM | POA: Diagnosis not present

## 2019-12-19 DIAGNOSIS — D509 Iron deficiency anemia, unspecified: Secondary | ICD-10-CM | POA: Diagnosis not present

## 2019-12-19 DIAGNOSIS — D631 Anemia in chronic kidney disease: Secondary | ICD-10-CM | POA: Diagnosis not present

## 2019-12-19 DIAGNOSIS — N2581 Secondary hyperparathyroidism of renal origin: Secondary | ICD-10-CM | POA: Diagnosis not present

## 2019-12-19 DIAGNOSIS — N186 End stage renal disease: Secondary | ICD-10-CM | POA: Diagnosis not present

## 2019-12-21 DIAGNOSIS — D631 Anemia in chronic kidney disease: Secondary | ICD-10-CM | POA: Diagnosis not present

## 2019-12-21 DIAGNOSIS — D509 Iron deficiency anemia, unspecified: Secondary | ICD-10-CM | POA: Diagnosis not present

## 2019-12-21 DIAGNOSIS — N2581 Secondary hyperparathyroidism of renal origin: Secondary | ICD-10-CM | POA: Diagnosis not present

## 2019-12-21 DIAGNOSIS — N186 End stage renal disease: Secondary | ICD-10-CM | POA: Diagnosis not present

## 2019-12-22 ENCOUNTER — Other Ambulatory Visit: Payer: Self-pay

## 2019-12-22 NOTE — Patient Outreach (Signed)
[facility]) Care Management  12/22/2019  TAFFIE ECKMANN 1950/01/25 [id number]   Telephone Assessment Quarterly Call    Unsuccessful quarterly outreach attempt to patient.   Plan: RN CM will make quarterly outreach attempt to patient within the month of March if no return call from patient. RN CM will send unsuccessful outreach letter to patient.  Enzo Montgomery, RN,BSN,CCM [place] Management Telephonic Care Management Coordinator Direct Phone: [phone] Toll Free: [phone] Fax: [phone]

## 2019-12-23 DIAGNOSIS — D509 Iron deficiency anemia, unspecified: Secondary | ICD-10-CM | POA: Diagnosis not present

## 2019-12-23 DIAGNOSIS — N2581 Secondary hyperparathyroidism of renal origin: Secondary | ICD-10-CM | POA: Diagnosis not present

## 2019-12-23 DIAGNOSIS — N186 End stage renal disease: Secondary | ICD-10-CM | POA: Diagnosis not present

## 2019-12-23 DIAGNOSIS — D631 Anemia in chronic kidney disease: Secondary | ICD-10-CM | POA: Diagnosis not present

## 2019-12-26 DIAGNOSIS — D631 Anemia in chronic kidney disease: Secondary | ICD-10-CM | POA: Diagnosis not present

## 2019-12-26 DIAGNOSIS — N186 End stage renal disease: Secondary | ICD-10-CM | POA: Diagnosis not present

## 2019-12-26 DIAGNOSIS — D509 Iron deficiency anemia, unspecified: Secondary | ICD-10-CM | POA: Diagnosis not present

## 2019-12-26 DIAGNOSIS — N2581 Secondary hyperparathyroidism of renal origin: Secondary | ICD-10-CM | POA: Diagnosis not present

## 2019-12-26 DIAGNOSIS — I82409 Acute embolism and thrombosis of unspecified deep veins of unspecified lower extremity: Secondary | ICD-10-CM | POA: Diagnosis not present

## 2019-12-28 DIAGNOSIS — D509 Iron deficiency anemia, unspecified: Secondary | ICD-10-CM | POA: Diagnosis not present

## 2019-12-28 DIAGNOSIS — D631 Anemia in chronic kidney disease: Secondary | ICD-10-CM | POA: Diagnosis not present

## 2019-12-28 DIAGNOSIS — N186 End stage renal disease: Secondary | ICD-10-CM | POA: Diagnosis not present

## 2019-12-28 DIAGNOSIS — N2581 Secondary hyperparathyroidism of renal origin: Secondary | ICD-10-CM | POA: Diagnosis not present

## 2019-12-30 DIAGNOSIS — N186 End stage renal disease: Secondary | ICD-10-CM | POA: Diagnosis not present

## 2019-12-30 DIAGNOSIS — N2581 Secondary hyperparathyroidism of renal origin: Secondary | ICD-10-CM | POA: Diagnosis not present

## 2019-12-30 DIAGNOSIS — D509 Iron deficiency anemia, unspecified: Secondary | ICD-10-CM | POA: Diagnosis not present

## 2019-12-30 DIAGNOSIS — D631 Anemia in chronic kidney disease: Secondary | ICD-10-CM | POA: Diagnosis not present

## 2020-01-02 DIAGNOSIS — D631 Anemia in chronic kidney disease: Secondary | ICD-10-CM | POA: Diagnosis not present

## 2020-01-02 DIAGNOSIS — D509 Iron deficiency anemia, unspecified: Secondary | ICD-10-CM | POA: Diagnosis not present

## 2020-01-02 DIAGNOSIS — N186 End stage renal disease: Secondary | ICD-10-CM | POA: Diagnosis not present

## 2020-01-02 DIAGNOSIS — N2581 Secondary hyperparathyroidism of renal origin: Secondary | ICD-10-CM | POA: Diagnosis not present

## 2020-01-02 DIAGNOSIS — I82409 Acute embolism and thrombosis of unspecified deep veins of unspecified lower extremity: Secondary | ICD-10-CM | POA: Diagnosis not present

## 2020-01-03 DIAGNOSIS — R760 Raised antibody titer: Secondary | ICD-10-CM | POA: Diagnosis not present

## 2020-01-03 DIAGNOSIS — I7300 Raynaud's syndrome without gangrene: Secondary | ICD-10-CM | POA: Diagnosis not present

## 2020-01-03 DIAGNOSIS — M329 Systemic lupus erythematosus, unspecified: Secondary | ICD-10-CM | POA: Diagnosis not present

## 2020-01-03 DIAGNOSIS — N186 End stage renal disease: Secondary | ICD-10-CM | POA: Diagnosis not present

## 2020-01-04 DIAGNOSIS — N2581 Secondary hyperparathyroidism of renal origin: Secondary | ICD-10-CM | POA: Diagnosis not present

## 2020-01-04 DIAGNOSIS — D509 Iron deficiency anemia, unspecified: Secondary | ICD-10-CM | POA: Diagnosis not present

## 2020-01-04 DIAGNOSIS — D631 Anemia in chronic kidney disease: Secondary | ICD-10-CM | POA: Diagnosis not present

## 2020-01-04 DIAGNOSIS — N186 End stage renal disease: Secondary | ICD-10-CM | POA: Diagnosis not present

## 2020-01-06 DIAGNOSIS — Z992 Dependence on renal dialysis: Secondary | ICD-10-CM | POA: Diagnosis not present

## 2020-01-06 DIAGNOSIS — N2581 Secondary hyperparathyroidism of renal origin: Secondary | ICD-10-CM | POA: Diagnosis not present

## 2020-01-06 DIAGNOSIS — D509 Iron deficiency anemia, unspecified: Secondary | ICD-10-CM | POA: Diagnosis not present

## 2020-01-06 DIAGNOSIS — D631 Anemia in chronic kidney disease: Secondary | ICD-10-CM | POA: Diagnosis not present

## 2020-01-06 DIAGNOSIS — N186 End stage renal disease: Secondary | ICD-10-CM | POA: Diagnosis not present

## 2020-01-09 DIAGNOSIS — N186 End stage renal disease: Secondary | ICD-10-CM | POA: Diagnosis not present

## 2020-01-09 DIAGNOSIS — N2581 Secondary hyperparathyroidism of renal origin: Secondary | ICD-10-CM | POA: Diagnosis not present

## 2020-01-09 DIAGNOSIS — D631 Anemia in chronic kidney disease: Secondary | ICD-10-CM | POA: Diagnosis not present

## 2020-01-09 DIAGNOSIS — D509 Iron deficiency anemia, unspecified: Secondary | ICD-10-CM | POA: Diagnosis not present

## 2020-01-09 DIAGNOSIS — I82409 Acute embolism and thrombosis of unspecified deep veins of unspecified lower extremity: Secondary | ICD-10-CM | POA: Diagnosis not present

## 2020-01-11 DIAGNOSIS — D631 Anemia in chronic kidney disease: Secondary | ICD-10-CM | POA: Diagnosis not present

## 2020-01-11 DIAGNOSIS — N186 End stage renal disease: Secondary | ICD-10-CM | POA: Diagnosis not present

## 2020-01-11 DIAGNOSIS — N2581 Secondary hyperparathyroidism of renal origin: Secondary | ICD-10-CM | POA: Diagnosis not present

## 2020-01-11 DIAGNOSIS — E119 Type 2 diabetes mellitus without complications: Secondary | ICD-10-CM | POA: Diagnosis not present

## 2020-01-11 DIAGNOSIS — D509 Iron deficiency anemia, unspecified: Secondary | ICD-10-CM | POA: Diagnosis not present

## 2020-01-13 DIAGNOSIS — D631 Anemia in chronic kidney disease: Secondary | ICD-10-CM | POA: Diagnosis not present

## 2020-01-13 DIAGNOSIS — D509 Iron deficiency anemia, unspecified: Secondary | ICD-10-CM | POA: Diagnosis not present

## 2020-01-13 DIAGNOSIS — N186 End stage renal disease: Secondary | ICD-10-CM | POA: Diagnosis not present

## 2020-01-13 DIAGNOSIS — N2581 Secondary hyperparathyroidism of renal origin: Secondary | ICD-10-CM | POA: Diagnosis not present

## 2020-01-16 DIAGNOSIS — D631 Anemia in chronic kidney disease: Secondary | ICD-10-CM | POA: Diagnosis not present

## 2020-01-16 DIAGNOSIS — I82409 Acute embolism and thrombosis of unspecified deep veins of unspecified lower extremity: Secondary | ICD-10-CM | POA: Diagnosis not present

## 2020-01-16 DIAGNOSIS — N186 End stage renal disease: Secondary | ICD-10-CM | POA: Diagnosis not present

## 2020-01-16 DIAGNOSIS — D509 Iron deficiency anemia, unspecified: Secondary | ICD-10-CM | POA: Diagnosis not present

## 2020-01-16 DIAGNOSIS — N2581 Secondary hyperparathyroidism of renal origin: Secondary | ICD-10-CM | POA: Diagnosis not present

## 2020-01-18 DIAGNOSIS — N186 End stage renal disease: Secondary | ICD-10-CM | POA: Diagnosis not present

## 2020-01-18 DIAGNOSIS — D509 Iron deficiency anemia, unspecified: Secondary | ICD-10-CM | POA: Diagnosis not present

## 2020-01-18 DIAGNOSIS — D631 Anemia in chronic kidney disease: Secondary | ICD-10-CM | POA: Diagnosis not present

## 2020-01-18 DIAGNOSIS — N2581 Secondary hyperparathyroidism of renal origin: Secondary | ICD-10-CM | POA: Diagnosis not present

## 2020-01-19 ENCOUNTER — Ambulatory Visit (INDEPENDENT_AMBULATORY_CARE_PROVIDER_SITE_OTHER): Payer: Medicare HMO | Admitting: Family Medicine

## 2020-01-19 ENCOUNTER — Encounter: Payer: Self-pay | Admitting: Family Medicine

## 2020-01-19 ENCOUNTER — Other Ambulatory Visit: Payer: Self-pay

## 2020-01-19 VITALS — BP 105/54 | HR 89 | Ht 62.0 in

## 2020-01-19 DIAGNOSIS — I10 Essential (primary) hypertension: Secondary | ICD-10-CM

## 2020-01-19 DIAGNOSIS — E785 Hyperlipidemia, unspecified: Secondary | ICD-10-CM | POA: Diagnosis not present

## 2020-01-19 DIAGNOSIS — M25552 Pain in left hip: Secondary | ICD-10-CM | POA: Diagnosis not present

## 2020-01-19 DIAGNOSIS — E782 Mixed hyperlipidemia: Secondary | ICD-10-CM | POA: Diagnosis not present

## 2020-01-19 DIAGNOSIS — N186 End stage renal disease: Secondary | ICD-10-CM

## 2020-01-19 DIAGNOSIS — Z992 Dependence on renal dialysis: Secondary | ICD-10-CM | POA: Diagnosis not present

## 2020-01-19 MED ORDER — AMLODIPINE BESYLATE 2.5 MG PO TABS: 2.5000 mg | ORAL_TABLET | Freq: Every evening | ORAL | 0 refills | Status: DC

## 2020-01-19 NOTE — Assessment & Plan Note (Signed)
Stable on dialysis 

## 2020-01-19 NOTE — Assessment & Plan Note (Signed)
Pressure is well controlled in fact it is a little too low so we discussed cutting back on her amlodipine to 2.5 mg for now.  We might even be able to discontinue it completely.

## 2020-01-19 NOTE — Assessment & Plan Note (Signed)
Tolerating statin well without any side effects.  Due for recheck of lipids its been a year and a half since we have checked her blood work.

## 2020-01-19 NOTE — Assessment & Plan Note (Signed)
That is post hip replacement she says the surgery went well and her pain is much improved she still has a little soreness from time to time she still uses her cane for balance.

## 2020-01-19 NOTE — Progress Notes (Signed)
Established Patient Office Visit  Subjective:  Patient ID: Joy Anderson, female    DOB: 27-May-1950  Age: 70 y.o. MRN: [id number]  CC:  Chief Complaint  Patient presents with  . Hypertension    HPI Joy Anderson presents for 6 months follow up.    Hypertension- Pt denies chest pain, SOB, dizziness, or heart palpitations.  Taking meds as directed w/o problems.  She reports that she has been experiencing some low blood pressures.  She says usually when she gets to dialysis it is in the 120s and then at the end of dialysis her systolic is often times in the 80s or low 90s and then she will have to sit and wait until she is able to go home she says it always makes her little bit nervous when she gets home and she knows her blood pressure is low she is not been having any recent chest pain or shortness of breath.  Hyperlipidemia - tolerating stating well with no myalgias or significant side effects.  Lab Results  Component Value Date   CHOL 131 09/02/2018   HDL 34 (L) 09/02/2018   LDLCALC 69 09/02/2018   LDLDIRECT 89 05/01/2014   TRIG 227 (H) 09/02/2018   CHOLHDL 3.9 09/02/2018        Past Medical History:  Diagnosis Date  . Antiphospholipid antibody with hypercoagulable state ([place]) 08/27/2010  . DVT (deep venous thrombosis) ([facility])    anti phospholipid antibody + -- Dr Lewanda Rife  . Hypertension   . OAB (overactive bladder)    off meds  . Post-menopausal   . Proteinuria    Dr Tressie Ellis  . Raynaud's disease     Past Surgical History:  Procedure Laterality Date  . AV FISTULA PLACEMENT  2016  . CATARACT EXTRACTION, BILATERAL  2017  . TOTAL ABDOMINAL HYSTERECTOMY  2001   for fibroids w/ 1 oophorectomy    Family History  Problem Relation Age of Onset  . Alcohol abuse Father   . Colon cancer Brother   . Prostate cancer Brother   . Cerebral palsy Brother   . Diabetes Other     Social History   Socioeconomic History  . Marital status: Divorced    Spouse name: Not  on file  . Number of children: Not on file  . Years of education: Not on file  . Highest education level: Not on file  Occupational History  . Not on file  Tobacco Use  . Smoking status: Never Smoker  . Smokeless tobacco: Never Used  Substance and Sexual Activity  . Alcohol use: Yes    Comment: occasional  . Drug use: No  . Sexual activity: Not on file  Other Topics Concern  . Not on file  Social History Narrative  . Not on file   Social Determinants of Health   Financial Resource Strain: Not on file  Food Insecurity: Not on file  Transportation Needs: Not on file  Physical Activity: Not on file  Stress: Not on file  Social Connections: Not on file  Intimate Partner Violence: Not on file    Outpatient Medications Prior to Visit  Medication Sig Dispense Refill  . acetaminophen (TYLENOL) 500 MG tablet Take 500 mg by mouth every 6 (six) hours as needed. Two tablets    . atorvastatin (LIPITOR) 20 MG tablet Take 1 tablet (20 mg total) by mouth at bedtime. Due for follow up visit 90 tablet 3  . CALCIUM PO Take by mouth.    Marland Kitchen  cinacalcet (SENSIPAR) 30 MG tablet Take 2 tablets by mouth daily.    Marland Kitchen HYDROcodone-acetaminophen (NORCO/VICODIN) 5-325 MG tablet Take 1 tablet by mouth every 4 (four) hours as needed for moderate pain.    . hydrocortisone 2.5 % cream Apply 1 application topically 2 (two) times daily as needed. 45 g 1  . hydroxychloroquine (PLAQUENIL) 200 MG tablet Take 200 mg by mouth 2 (two) times daily.    . multivitamin (RENA-VIT) TABS tablet     . warfarin (COUMADIN) 1 MG tablet     . warfarin (COUMADIN) 4 MG tablet Take by mouth.    . warfarin (COUMADIN) 5 MG tablet TAKE 1 TABLET BY MOUTH ON SUNDAY, WENESDAY, THURSDAY, FRIDAY, AND SATURDAY. THEN TAKE 1 AND 1/2 TABLET ON MONDAY AND TUESDAY (Patient taking differently: Patient states taking 2.5mg (half tablet) daily) 97 tablet 1  . amLODipine (NORVASC) 5 MG tablet Take 5 mg by mouth every evening.    Marland Kitchen losartan (COZAAR)  100 MG tablet Take 100 mg by mouth daily.     No facility-administered medications prior to visit.    Allergies  Allergen Reactions  . Ace Inhibitors     REACTION: cough Other reaction(s): Other (See Comments) REACTION: cough  . Metformin     Other reaction(s): Other (See Comments) Nausea, decreased appetite Nausea, decreased appetite  . Fish Oil Other (See Comments)    Other reaction(s): Other (See Comments)  . Metformin And Related Other (See Comments)    Nausea, decreased appetite    ROS Review of Systems    Objective:    Physical Exam Constitutional:      Appearance: She is well-developed and well-nourished.  HENT:     Head: Normocephalic and atraumatic.  Cardiovascular:     Rate and Rhythm: Normal rate and regular rhythm.     Heart sounds: Normal heart sounds.  Pulmonary:     Effort: Pulmonary effort is normal.     Breath sounds: Normal breath sounds.  Skin:    General: Skin is warm and dry.  Neurological:     Mental Status: She is alert and oriented to person, place, and time.  Psychiatric:        Mood and Affect: Mood and affect normal.        Behavior: Behavior normal.     BP (!) 105/54   Pulse 89   Ht 5\' 2"  (1.575 m)   LMP 03/18/1999   SpO2 100%   BMI 26.89 kg/m  Wt Readings from Last 3 Encounters:  07/21/19 147 lb (66.7 kg)  03/03/19 145 lb (65.8 kg)  08/24/18 153 lb (69.4 kg)     Health Maintenance Due  Topic Date Due  . DEXA SCAN  09/18/2017    There are no preventive care reminders to display for this patient.  Lab Results  Component Value Date   TSH 2.357 04/25/2014   Lab Results  Component Value Date   WBC 7.5 09/09/2017   HGB 10.0 (A) 09/09/2017   HCT 30 (A) 09/09/2017   MCV 83.0 04/25/2014   PLT 230 09/09/2017   Lab Results  Component Value Date   NA 142 09/02/2018   K 4.2 09/02/2018   CO2 30 09/02/2018   GLUCOSE 90 09/02/2018   BUN 18 09/02/2018   CREATININE 7.79 (H) 09/02/2018   BILITOT 0.4 09/02/2018    ALKPHOS 78 09/09/2017   AST 14 09/02/2018   ALT 15 09/02/2018   PROT 7.5 09/02/2018   ALBUMIN 4.5 08/06/2016   ALBUMIN 4.5  08/06/2016   CALCIUM 9.7 09/02/2018   Lab Results  Component Value Date   CHOL 131 09/02/2018   Lab Results  Component Value Date   HDL 34 (L) 09/02/2018   Lab Results  Component Value Date   LDLCALC 69 09/02/2018   Lab Results  Component Value Date   TRIG 227 (H) 09/02/2018   Lab Results  Component Value Date   CHOLHDL 3.9 09/02/2018   Lab Results  Component Value Date   HGBA1C 5.2 09/01/2017      Assessment & Plan:   Problem List Items Addressed This Visit      Cardiovascular and Mediastinum   ESSENTIAL HYPERTENSION, BENIGN - Primary    Pressure is well controlled in fact it is a little too low so we discussed cutting back on her amlodipine to 2.5 mg for now.  We might even be able to discontinue it completely.      Relevant Medications   amLODipine (NORVASC) 2.5 MG tablet   Other Relevant Orders   Lipid panel   Hemoglobin A1c     Genitourinary   ESRD on dialysis ([place])    Stable on dialysis.        Other   Pain in joint of left hip    That is post hip replacement she says the surgery went well and her pain is much improved she still has a little soreness from time to time she still uses her cane for balance.      Elevated triglycerides with high cholesterol    Tolerating statin well without any side effects.  Due for recheck of lipids its been a year and a half since we have checked her blood work.      Relevant Medications   amLODipine (NORVASC) 2.5 MG tablet   Other Relevant Orders   Lipid panel   Hemoglobin A1c    Other Visit Diagnoses    Hyperlipidemia, unspecified hyperlipidemia type       Relevant Medications   amLODipine (NORVASC) 2.5 MG tablet   Other Relevant Orders   Hemoglobin A1c      Meds ordered this encounter  Medications  . amLODipine (NORVASC) 2.5 MG tablet    Sig: Take 1 tablet (2.5 mg total) by  mouth every evening.    Dispense:  90 tablet    Refill:  0    Follow-up: Return in about 6 months (around 07/18/2020) for Hypertension.    [name], MD

## 2020-01-20 DIAGNOSIS — D509 Iron deficiency anemia, unspecified: Secondary | ICD-10-CM | POA: Diagnosis not present

## 2020-01-20 DIAGNOSIS — N2581 Secondary hyperparathyroidism of renal origin: Secondary | ICD-10-CM | POA: Diagnosis not present

## 2020-01-20 DIAGNOSIS — D631 Anemia in chronic kidney disease: Secondary | ICD-10-CM | POA: Diagnosis not present

## 2020-01-20 DIAGNOSIS — N186 End stage renal disease: Secondary | ICD-10-CM | POA: Diagnosis not present

## 2020-01-25 DIAGNOSIS — D631 Anemia in chronic kidney disease: Secondary | ICD-10-CM | POA: Diagnosis not present

## 2020-01-25 DIAGNOSIS — D509 Iron deficiency anemia, unspecified: Secondary | ICD-10-CM | POA: Diagnosis not present

## 2020-01-25 DIAGNOSIS — I82409 Acute embolism and thrombosis of unspecified deep veins of unspecified lower extremity: Secondary | ICD-10-CM | POA: Diagnosis not present

## 2020-01-25 DIAGNOSIS — N2581 Secondary hyperparathyroidism of renal origin: Secondary | ICD-10-CM | POA: Diagnosis not present

## 2020-01-25 DIAGNOSIS — N186 End stage renal disease: Secondary | ICD-10-CM | POA: Diagnosis not present

## 2020-01-27 DIAGNOSIS — Z1159 Encounter for screening for other viral diseases: Secondary | ICD-10-CM | POA: Diagnosis not present

## 2020-01-27 DIAGNOSIS — N186 End stage renal disease: Secondary | ICD-10-CM | POA: Diagnosis not present

## 2020-01-27 DIAGNOSIS — N2581 Secondary hyperparathyroidism of renal origin: Secondary | ICD-10-CM | POA: Diagnosis not present

## 2020-01-27 DIAGNOSIS — D509 Iron deficiency anemia, unspecified: Secondary | ICD-10-CM | POA: Diagnosis not present

## 2020-01-27 DIAGNOSIS — D631 Anemia in chronic kidney disease: Secondary | ICD-10-CM | POA: Diagnosis not present

## 2020-01-27 DIAGNOSIS — Z114 Encounter for screening for human immunodeficiency virus [HIV]: Secondary | ICD-10-CM | POA: Diagnosis not present

## 2020-01-30 DIAGNOSIS — I82409 Acute embolism and thrombosis of unspecified deep veins of unspecified lower extremity: Secondary | ICD-10-CM | POA: Diagnosis not present

## 2020-01-30 DIAGNOSIS — N186 End stage renal disease: Secondary | ICD-10-CM | POA: Diagnosis not present

## 2020-01-30 DIAGNOSIS — N2581 Secondary hyperparathyroidism of renal origin: Secondary | ICD-10-CM | POA: Diagnosis not present

## 2020-01-30 DIAGNOSIS — D631 Anemia in chronic kidney disease: Secondary | ICD-10-CM | POA: Diagnosis not present

## 2020-01-30 DIAGNOSIS — D509 Iron deficiency anemia, unspecified: Secondary | ICD-10-CM | POA: Diagnosis not present

## 2020-02-01 DIAGNOSIS — N186 End stage renal disease: Secondary | ICD-10-CM | POA: Diagnosis not present

## 2020-02-01 DIAGNOSIS — D631 Anemia in chronic kidney disease: Secondary | ICD-10-CM | POA: Diagnosis not present

## 2020-02-01 DIAGNOSIS — N2581 Secondary hyperparathyroidism of renal origin: Secondary | ICD-10-CM | POA: Diagnosis not present

## 2020-02-01 DIAGNOSIS — D509 Iron deficiency anemia, unspecified: Secondary | ICD-10-CM | POA: Diagnosis not present

## 2020-02-03 DIAGNOSIS — D631 Anemia in chronic kidney disease: Secondary | ICD-10-CM | POA: Diagnosis not present

## 2020-02-03 DIAGNOSIS — D509 Iron deficiency anemia, unspecified: Secondary | ICD-10-CM | POA: Diagnosis not present

## 2020-02-03 DIAGNOSIS — N2581 Secondary hyperparathyroidism of renal origin: Secondary | ICD-10-CM | POA: Diagnosis not present

## 2020-02-03 DIAGNOSIS — N186 End stage renal disease: Secondary | ICD-10-CM | POA: Diagnosis not present

## 2020-02-06 DIAGNOSIS — N186 End stage renal disease: Secondary | ICD-10-CM | POA: Diagnosis not present

## 2020-02-06 DIAGNOSIS — D631 Anemia in chronic kidney disease: Secondary | ICD-10-CM | POA: Diagnosis not present

## 2020-02-06 DIAGNOSIS — Z992 Dependence on renal dialysis: Secondary | ICD-10-CM | POA: Diagnosis not present

## 2020-02-06 DIAGNOSIS — N2581 Secondary hyperparathyroidism of renal origin: Secondary | ICD-10-CM | POA: Diagnosis not present

## 2020-02-06 DIAGNOSIS — I82409 Acute embolism and thrombosis of unspecified deep veins of unspecified lower extremity: Secondary | ICD-10-CM | POA: Diagnosis not present

## 2020-02-06 DIAGNOSIS — D509 Iron deficiency anemia, unspecified: Secondary | ICD-10-CM | POA: Diagnosis not present

## 2020-02-07 DIAGNOSIS — D631 Anemia in chronic kidney disease: Secondary | ICD-10-CM | POA: Diagnosis not present

## 2020-02-07 DIAGNOSIS — D509 Iron deficiency anemia, unspecified: Secondary | ICD-10-CM | POA: Diagnosis not present

## 2020-02-07 DIAGNOSIS — N186 End stage renal disease: Secondary | ICD-10-CM | POA: Diagnosis not present

## 2020-02-07 DIAGNOSIS — N2581 Secondary hyperparathyroidism of renal origin: Secondary | ICD-10-CM | POA: Diagnosis not present

## 2020-02-08 DIAGNOSIS — E119 Type 2 diabetes mellitus without complications: Secondary | ICD-10-CM | POA: Diagnosis not present

## 2020-02-08 DIAGNOSIS — D509 Iron deficiency anemia, unspecified: Secondary | ICD-10-CM | POA: Diagnosis not present

## 2020-02-08 DIAGNOSIS — D631 Anemia in chronic kidney disease: Secondary | ICD-10-CM | POA: Diagnosis not present

## 2020-02-08 DIAGNOSIS — N186 End stage renal disease: Secondary | ICD-10-CM | POA: Diagnosis not present

## 2020-02-08 DIAGNOSIS — N2581 Secondary hyperparathyroidism of renal origin: Secondary | ICD-10-CM | POA: Diagnosis not present

## 2020-02-10 DIAGNOSIS — N2581 Secondary hyperparathyroidism of renal origin: Secondary | ICD-10-CM | POA: Diagnosis not present

## 2020-02-10 DIAGNOSIS — N186 End stage renal disease: Secondary | ICD-10-CM | POA: Diagnosis not present

## 2020-02-10 DIAGNOSIS — D631 Anemia in chronic kidney disease: Secondary | ICD-10-CM | POA: Diagnosis not present

## 2020-02-10 DIAGNOSIS — D509 Iron deficiency anemia, unspecified: Secondary | ICD-10-CM | POA: Diagnosis not present

## 2020-02-13 DIAGNOSIS — N2581 Secondary hyperparathyroidism of renal origin: Secondary | ICD-10-CM | POA: Diagnosis not present

## 2020-02-13 DIAGNOSIS — D509 Iron deficiency anemia, unspecified: Secondary | ICD-10-CM | POA: Diagnosis not present

## 2020-02-13 DIAGNOSIS — D631 Anemia in chronic kidney disease: Secondary | ICD-10-CM | POA: Diagnosis not present

## 2020-02-13 DIAGNOSIS — I82409 Acute embolism and thrombosis of unspecified deep veins of unspecified lower extremity: Secondary | ICD-10-CM | POA: Diagnosis not present

## 2020-02-13 DIAGNOSIS — N186 End stage renal disease: Secondary | ICD-10-CM | POA: Diagnosis not present

## 2020-02-15 DIAGNOSIS — N2581 Secondary hyperparathyroidism of renal origin: Secondary | ICD-10-CM | POA: Diagnosis not present

## 2020-02-15 DIAGNOSIS — D509 Iron deficiency anemia, unspecified: Secondary | ICD-10-CM | POA: Diagnosis not present

## 2020-02-15 DIAGNOSIS — D631 Anemia in chronic kidney disease: Secondary | ICD-10-CM | POA: Diagnosis not present

## 2020-02-15 DIAGNOSIS — N186 End stage renal disease: Secondary | ICD-10-CM | POA: Diagnosis not present

## 2020-02-17 DIAGNOSIS — N2581 Secondary hyperparathyroidism of renal origin: Secondary | ICD-10-CM | POA: Diagnosis not present

## 2020-02-17 DIAGNOSIS — D509 Iron deficiency anemia, unspecified: Secondary | ICD-10-CM | POA: Diagnosis not present

## 2020-02-17 DIAGNOSIS — N186 End stage renal disease: Secondary | ICD-10-CM | POA: Diagnosis not present

## 2020-02-17 DIAGNOSIS — D631 Anemia in chronic kidney disease: Secondary | ICD-10-CM | POA: Diagnosis not present

## 2020-02-20 DIAGNOSIS — N186 End stage renal disease: Secondary | ICD-10-CM | POA: Diagnosis not present

## 2020-02-20 DIAGNOSIS — N2581 Secondary hyperparathyroidism of renal origin: Secondary | ICD-10-CM | POA: Diagnosis not present

## 2020-02-20 DIAGNOSIS — I82409 Acute embolism and thrombosis of unspecified deep veins of unspecified lower extremity: Secondary | ICD-10-CM | POA: Diagnosis not present

## 2020-02-20 DIAGNOSIS — D509 Iron deficiency anemia, unspecified: Secondary | ICD-10-CM | POA: Diagnosis not present

## 2020-02-20 DIAGNOSIS — D631 Anemia in chronic kidney disease: Secondary | ICD-10-CM | POA: Diagnosis not present

## 2020-02-22 DIAGNOSIS — D509 Iron deficiency anemia, unspecified: Secondary | ICD-10-CM | POA: Diagnosis not present

## 2020-02-22 DIAGNOSIS — N186 End stage renal disease: Secondary | ICD-10-CM | POA: Diagnosis not present

## 2020-02-22 DIAGNOSIS — N2581 Secondary hyperparathyroidism of renal origin: Secondary | ICD-10-CM | POA: Diagnosis not present

## 2020-02-22 DIAGNOSIS — D631 Anemia in chronic kidney disease: Secondary | ICD-10-CM | POA: Diagnosis not present

## 2020-02-24 DIAGNOSIS — D631 Anemia in chronic kidney disease: Secondary | ICD-10-CM | POA: Diagnosis not present

## 2020-02-24 DIAGNOSIS — D509 Iron deficiency anemia, unspecified: Secondary | ICD-10-CM | POA: Diagnosis not present

## 2020-02-24 DIAGNOSIS — N186 End stage renal disease: Secondary | ICD-10-CM | POA: Diagnosis not present

## 2020-02-24 DIAGNOSIS — N2581 Secondary hyperparathyroidism of renal origin: Secondary | ICD-10-CM | POA: Diagnosis not present

## 2020-02-27 DIAGNOSIS — D509 Iron deficiency anemia, unspecified: Secondary | ICD-10-CM | POA: Diagnosis not present

## 2020-02-27 DIAGNOSIS — N186 End stage renal disease: Secondary | ICD-10-CM | POA: Diagnosis not present

## 2020-02-27 DIAGNOSIS — D631 Anemia in chronic kidney disease: Secondary | ICD-10-CM | POA: Diagnosis not present

## 2020-02-27 DIAGNOSIS — N2581 Secondary hyperparathyroidism of renal origin: Secondary | ICD-10-CM | POA: Diagnosis not present

## 2020-02-27 DIAGNOSIS — I82409 Acute embolism and thrombosis of unspecified deep veins of unspecified lower extremity: Secondary | ICD-10-CM | POA: Diagnosis not present

## 2020-02-28 DIAGNOSIS — E785 Hyperlipidemia, unspecified: Secondary | ICD-10-CM | POA: Diagnosis not present

## 2020-02-28 DIAGNOSIS — I10 Essential (primary) hypertension: Secondary | ICD-10-CM | POA: Diagnosis not present

## 2020-02-28 DIAGNOSIS — E782 Mixed hyperlipidemia: Secondary | ICD-10-CM | POA: Diagnosis not present

## 2020-02-29 DIAGNOSIS — N186 End stage renal disease: Secondary | ICD-10-CM | POA: Diagnosis not present

## 2020-02-29 DIAGNOSIS — N2581 Secondary hyperparathyroidism of renal origin: Secondary | ICD-10-CM | POA: Diagnosis not present

## 2020-02-29 DIAGNOSIS — D631 Anemia in chronic kidney disease: Secondary | ICD-10-CM | POA: Diagnosis not present

## 2020-02-29 DIAGNOSIS — D509 Iron deficiency anemia, unspecified: Secondary | ICD-10-CM | POA: Diagnosis not present

## 2020-02-29 LAB — HEMOGLOBIN A1C
Hgb A1c MFr Bld: 5.2 % of total Hgb (ref <5.7)
Mean Plasma Glucose: 103 mg/dL
eAG (mmol/L): 5.7 mmol/L

## 2020-02-29 LAB — LIPID PANEL
Cholesterol: 162 mg/dL (ref <200)
HDL: 41 mg/dL — ABNORMAL LOW (ref >=50)
LDL Cholesterol (Calc): 90 mg/dL (calc)
Non-HDL Cholesterol (Calc): 121 mg/dL (calc) (ref <130)
Total CHOL/HDL Ratio: 4 (calc) (ref <5.0)
Triglycerides: 213 mg/dL — ABNORMAL HIGH (ref <150)

## 2020-03-02 DIAGNOSIS — D631 Anemia in chronic kidney disease: Secondary | ICD-10-CM | POA: Diagnosis not present

## 2020-03-02 DIAGNOSIS — N186 End stage renal disease: Secondary | ICD-10-CM | POA: Diagnosis not present

## 2020-03-02 DIAGNOSIS — N2581 Secondary hyperparathyroidism of renal origin: Secondary | ICD-10-CM | POA: Diagnosis not present

## 2020-03-02 DIAGNOSIS — D509 Iron deficiency anemia, unspecified: Secondary | ICD-10-CM | POA: Diagnosis not present

## 2020-03-05 DIAGNOSIS — N186 End stage renal disease: Secondary | ICD-10-CM | POA: Diagnosis not present

## 2020-03-05 DIAGNOSIS — Z992 Dependence on renal dialysis: Secondary | ICD-10-CM | POA: Diagnosis not present

## 2020-03-05 DIAGNOSIS — N2581 Secondary hyperparathyroidism of renal origin: Secondary | ICD-10-CM | POA: Diagnosis not present

## 2020-03-05 DIAGNOSIS — D631 Anemia in chronic kidney disease: Secondary | ICD-10-CM | POA: Diagnosis not present

## 2020-03-05 DIAGNOSIS — D509 Iron deficiency anemia, unspecified: Secondary | ICD-10-CM | POA: Diagnosis not present

## 2020-03-05 DIAGNOSIS — I82409 Acute embolism and thrombosis of unspecified deep veins of unspecified lower extremity: Secondary | ICD-10-CM | POA: Diagnosis not present

## 2020-03-06 DIAGNOSIS — N186 End stage renal disease: Secondary | ICD-10-CM | POA: Diagnosis not present

## 2020-03-06 DIAGNOSIS — D631 Anemia in chronic kidney disease: Secondary | ICD-10-CM | POA: Diagnosis not present

## 2020-03-06 DIAGNOSIS — N2581 Secondary hyperparathyroidism of renal origin: Secondary | ICD-10-CM | POA: Diagnosis not present

## 2020-03-06 DIAGNOSIS — D509 Iron deficiency anemia, unspecified: Secondary | ICD-10-CM | POA: Diagnosis not present

## 2020-03-07 DIAGNOSIS — D509 Iron deficiency anemia, unspecified: Secondary | ICD-10-CM | POA: Diagnosis not present

## 2020-03-07 DIAGNOSIS — N2581 Secondary hyperparathyroidism of renal origin: Secondary | ICD-10-CM | POA: Diagnosis not present

## 2020-03-07 DIAGNOSIS — E119 Type 2 diabetes mellitus without complications: Secondary | ICD-10-CM | POA: Diagnosis not present

## 2020-03-07 DIAGNOSIS — Z1159 Encounter for screening for other viral diseases: Secondary | ICD-10-CM | POA: Diagnosis not present

## 2020-03-07 DIAGNOSIS — N186 End stage renal disease: Secondary | ICD-10-CM | POA: Diagnosis not present

## 2020-03-07 DIAGNOSIS — D631 Anemia in chronic kidney disease: Secondary | ICD-10-CM | POA: Diagnosis not present

## 2020-03-09 DIAGNOSIS — N186 End stage renal disease: Secondary | ICD-10-CM | POA: Diagnosis not present

## 2020-03-09 DIAGNOSIS — D631 Anemia in chronic kidney disease: Secondary | ICD-10-CM | POA: Diagnosis not present

## 2020-03-09 DIAGNOSIS — D509 Iron deficiency anemia, unspecified: Secondary | ICD-10-CM | POA: Diagnosis not present

## 2020-03-09 DIAGNOSIS — N2581 Secondary hyperparathyroidism of renal origin: Secondary | ICD-10-CM | POA: Diagnosis not present

## 2020-03-12 DIAGNOSIS — N186 End stage renal disease: Secondary | ICD-10-CM | POA: Diagnosis not present

## 2020-03-12 DIAGNOSIS — I82409 Acute embolism and thrombosis of unspecified deep veins of unspecified lower extremity: Secondary | ICD-10-CM | POA: Diagnosis not present

## 2020-03-12 DIAGNOSIS — D509 Iron deficiency anemia, unspecified: Secondary | ICD-10-CM | POA: Diagnosis not present

## 2020-03-12 DIAGNOSIS — D631 Anemia in chronic kidney disease: Secondary | ICD-10-CM | POA: Diagnosis not present

## 2020-03-12 DIAGNOSIS — N2581 Secondary hyperparathyroidism of renal origin: Secondary | ICD-10-CM | POA: Diagnosis not present

## 2020-03-14 DIAGNOSIS — N186 End stage renal disease: Secondary | ICD-10-CM | POA: Diagnosis not present

## 2020-03-14 DIAGNOSIS — D631 Anemia in chronic kidney disease: Secondary | ICD-10-CM | POA: Diagnosis not present

## 2020-03-14 DIAGNOSIS — D509 Iron deficiency anemia, unspecified: Secondary | ICD-10-CM | POA: Diagnosis not present

## 2020-03-14 DIAGNOSIS — N2581 Secondary hyperparathyroidism of renal origin: Secondary | ICD-10-CM | POA: Diagnosis not present

## 2020-03-16 DIAGNOSIS — N2581 Secondary hyperparathyroidism of renal origin: Secondary | ICD-10-CM | POA: Diagnosis not present

## 2020-03-16 DIAGNOSIS — N186 End stage renal disease: Secondary | ICD-10-CM | POA: Diagnosis not present

## 2020-03-16 DIAGNOSIS — D631 Anemia in chronic kidney disease: Secondary | ICD-10-CM | POA: Diagnosis not present

## 2020-03-16 DIAGNOSIS — D509 Iron deficiency anemia, unspecified: Secondary | ICD-10-CM | POA: Diagnosis not present

## 2020-03-19 DIAGNOSIS — N186 End stage renal disease: Secondary | ICD-10-CM | POA: Diagnosis not present

## 2020-03-19 DIAGNOSIS — N2581 Secondary hyperparathyroidism of renal origin: Secondary | ICD-10-CM | POA: Diagnosis not present

## 2020-03-19 DIAGNOSIS — D509 Iron deficiency anemia, unspecified: Secondary | ICD-10-CM | POA: Diagnosis not present

## 2020-03-19 DIAGNOSIS — I82409 Acute embolism and thrombosis of unspecified deep veins of unspecified lower extremity: Secondary | ICD-10-CM | POA: Diagnosis not present

## 2020-03-19 DIAGNOSIS — D631 Anemia in chronic kidney disease: Secondary | ICD-10-CM | POA: Diagnosis not present

## 2020-03-21 ENCOUNTER — Other Ambulatory Visit: Payer: Self-pay

## 2020-03-21 DIAGNOSIS — N186 End stage renal disease: Secondary | ICD-10-CM | POA: Diagnosis not present

## 2020-03-21 DIAGNOSIS — D631 Anemia in chronic kidney disease: Secondary | ICD-10-CM | POA: Diagnosis not present

## 2020-03-21 DIAGNOSIS — N2581 Secondary hyperparathyroidism of renal origin: Secondary | ICD-10-CM | POA: Diagnosis not present

## 2020-03-21 DIAGNOSIS — D509 Iron deficiency anemia, unspecified: Secondary | ICD-10-CM | POA: Diagnosis not present

## 2020-03-21 NOTE — Patient Outreach (Signed)
[facility]) Care Management  03/21/2020  Joy Anderson [birth year] [id number]   Telephone Assessment Quarterly Call    Unsuccessful outreach attempt to patient-no answer at present.    Plan: RN CM will make quarterly outreach attempt within the month of Junes RN CM will send unsuccessful outreach letter to patient.   Enzo Montgomery, RN,BSN,CCM [place] Management Telephonic Care Management Coordinator Direct Phone: [phone] Toll Free: [phone] Fax: [phone]

## 2020-03-23 DIAGNOSIS — D509 Iron deficiency anemia, unspecified: Secondary | ICD-10-CM | POA: Diagnosis not present

## 2020-03-23 DIAGNOSIS — N2581 Secondary hyperparathyroidism of renal origin: Secondary | ICD-10-CM | POA: Diagnosis not present

## 2020-03-23 DIAGNOSIS — D631 Anemia in chronic kidney disease: Secondary | ICD-10-CM | POA: Diagnosis not present

## 2020-03-23 DIAGNOSIS — N186 End stage renal disease: Secondary | ICD-10-CM | POA: Diagnosis not present

## 2020-03-26 DIAGNOSIS — D509 Iron deficiency anemia, unspecified: Secondary | ICD-10-CM | POA: Diagnosis not present

## 2020-03-26 DIAGNOSIS — N2581 Secondary hyperparathyroidism of renal origin: Secondary | ICD-10-CM | POA: Diagnosis not present

## 2020-03-26 DIAGNOSIS — I82409 Acute embolism and thrombosis of unspecified deep veins of unspecified lower extremity: Secondary | ICD-10-CM | POA: Diagnosis not present

## 2020-03-26 DIAGNOSIS — D631 Anemia in chronic kidney disease: Secondary | ICD-10-CM | POA: Diagnosis not present

## 2020-03-26 DIAGNOSIS — N186 End stage renal disease: Secondary | ICD-10-CM | POA: Diagnosis not present

## 2020-03-28 DIAGNOSIS — N2581 Secondary hyperparathyroidism of renal origin: Secondary | ICD-10-CM | POA: Diagnosis not present

## 2020-03-28 DIAGNOSIS — N186 End stage renal disease: Secondary | ICD-10-CM | POA: Diagnosis not present

## 2020-03-28 DIAGNOSIS — D631 Anemia in chronic kidney disease: Secondary | ICD-10-CM | POA: Diagnosis not present

## 2020-03-28 DIAGNOSIS — D509 Iron deficiency anemia, unspecified: Secondary | ICD-10-CM | POA: Diagnosis not present

## 2020-03-30 DIAGNOSIS — D509 Iron deficiency anemia, unspecified: Secondary | ICD-10-CM | POA: Diagnosis not present

## 2020-03-30 DIAGNOSIS — N2581 Secondary hyperparathyroidism of renal origin: Secondary | ICD-10-CM | POA: Diagnosis not present

## 2020-03-30 DIAGNOSIS — N186 End stage renal disease: Secondary | ICD-10-CM | POA: Diagnosis not present

## 2020-03-30 DIAGNOSIS — D631 Anemia in chronic kidney disease: Secondary | ICD-10-CM | POA: Diagnosis not present

## 2020-04-02 DIAGNOSIS — D631 Anemia in chronic kidney disease: Secondary | ICD-10-CM | POA: Diagnosis not present

## 2020-04-02 DIAGNOSIS — N186 End stage renal disease: Secondary | ICD-10-CM | POA: Diagnosis not present

## 2020-04-02 DIAGNOSIS — N2581 Secondary hyperparathyroidism of renal origin: Secondary | ICD-10-CM | POA: Diagnosis not present

## 2020-04-02 DIAGNOSIS — D509 Iron deficiency anemia, unspecified: Secondary | ICD-10-CM | POA: Diagnosis not present

## 2020-04-04 DIAGNOSIS — N2581 Secondary hyperparathyroidism of renal origin: Secondary | ICD-10-CM | POA: Diagnosis not present

## 2020-04-04 DIAGNOSIS — D631 Anemia in chronic kidney disease: Secondary | ICD-10-CM | POA: Diagnosis not present

## 2020-04-04 DIAGNOSIS — D509 Iron deficiency anemia, unspecified: Secondary | ICD-10-CM | POA: Diagnosis not present

## 2020-04-04 DIAGNOSIS — N186 End stage renal disease: Secondary | ICD-10-CM | POA: Diagnosis not present

## 2020-04-05 DIAGNOSIS — Z992 Dependence on renal dialysis: Secondary | ICD-10-CM | POA: Diagnosis not present

## 2020-04-05 DIAGNOSIS — N186 End stage renal disease: Secondary | ICD-10-CM | POA: Diagnosis not present

## 2020-04-06 DIAGNOSIS — D631 Anemia in chronic kidney disease: Secondary | ICD-10-CM | POA: Diagnosis not present

## 2020-04-06 DIAGNOSIS — D509 Iron deficiency anemia, unspecified: Secondary | ICD-10-CM | POA: Diagnosis not present

## 2020-04-06 DIAGNOSIS — N186 End stage renal disease: Secondary | ICD-10-CM | POA: Diagnosis not present

## 2020-04-06 DIAGNOSIS — N2581 Secondary hyperparathyroidism of renal origin: Secondary | ICD-10-CM | POA: Diagnosis not present

## 2020-04-09 DIAGNOSIS — D631 Anemia in chronic kidney disease: Secondary | ICD-10-CM | POA: Diagnosis not present

## 2020-04-09 DIAGNOSIS — D509 Iron deficiency anemia, unspecified: Secondary | ICD-10-CM | POA: Diagnosis not present

## 2020-04-09 DIAGNOSIS — N2581 Secondary hyperparathyroidism of renal origin: Secondary | ICD-10-CM | POA: Diagnosis not present

## 2020-04-09 DIAGNOSIS — N186 End stage renal disease: Secondary | ICD-10-CM | POA: Diagnosis not present

## 2020-04-11 DIAGNOSIS — D631 Anemia in chronic kidney disease: Secondary | ICD-10-CM | POA: Diagnosis not present

## 2020-04-11 DIAGNOSIS — D509 Iron deficiency anemia, unspecified: Secondary | ICD-10-CM | POA: Diagnosis not present

## 2020-04-11 DIAGNOSIS — N186 End stage renal disease: Secondary | ICD-10-CM | POA: Diagnosis not present

## 2020-04-11 DIAGNOSIS — N2581 Secondary hyperparathyroidism of renal origin: Secondary | ICD-10-CM | POA: Diagnosis not present

## 2020-04-12 ENCOUNTER — Other Ambulatory Visit: Payer: Self-pay | Admitting: Family Medicine

## 2020-04-12 DIAGNOSIS — I10 Essential (primary) hypertension: Secondary | ICD-10-CM

## 2020-04-13 DIAGNOSIS — D631 Anemia in chronic kidney disease: Secondary | ICD-10-CM | POA: Diagnosis not present

## 2020-04-13 DIAGNOSIS — D509 Iron deficiency anemia, unspecified: Secondary | ICD-10-CM | POA: Diagnosis not present

## 2020-04-13 DIAGNOSIS — N2581 Secondary hyperparathyroidism of renal origin: Secondary | ICD-10-CM | POA: Diagnosis not present

## 2020-04-13 DIAGNOSIS — N186 End stage renal disease: Secondary | ICD-10-CM | POA: Diagnosis not present

## 2020-04-16 DIAGNOSIS — D509 Iron deficiency anemia, unspecified: Secondary | ICD-10-CM | POA: Diagnosis not present

## 2020-04-16 DIAGNOSIS — N2581 Secondary hyperparathyroidism of renal origin: Secondary | ICD-10-CM | POA: Diagnosis not present

## 2020-04-16 DIAGNOSIS — N186 End stage renal disease: Secondary | ICD-10-CM | POA: Diagnosis not present

## 2020-04-16 DIAGNOSIS — D631 Anemia in chronic kidney disease: Secondary | ICD-10-CM | POA: Diagnosis not present

## 2020-04-18 DIAGNOSIS — D631 Anemia in chronic kidney disease: Secondary | ICD-10-CM | POA: Diagnosis not present

## 2020-04-18 DIAGNOSIS — N2581 Secondary hyperparathyroidism of renal origin: Secondary | ICD-10-CM | POA: Diagnosis not present

## 2020-04-18 DIAGNOSIS — D509 Iron deficiency anemia, unspecified: Secondary | ICD-10-CM | POA: Diagnosis not present

## 2020-04-18 DIAGNOSIS — N186 End stage renal disease: Secondary | ICD-10-CM | POA: Diagnosis not present

## 2020-04-20 DIAGNOSIS — N186 End stage renal disease: Secondary | ICD-10-CM | POA: Diagnosis not present

## 2020-04-20 DIAGNOSIS — N2581 Secondary hyperparathyroidism of renal origin: Secondary | ICD-10-CM | POA: Diagnosis not present

## 2020-04-20 DIAGNOSIS — D509 Iron deficiency anemia, unspecified: Secondary | ICD-10-CM | POA: Diagnosis not present

## 2020-04-20 DIAGNOSIS — D631 Anemia in chronic kidney disease: Secondary | ICD-10-CM | POA: Diagnosis not present

## 2020-04-23 DIAGNOSIS — D509 Iron deficiency anemia, unspecified: Secondary | ICD-10-CM | POA: Diagnosis not present

## 2020-04-23 DIAGNOSIS — N2581 Secondary hyperparathyroidism of renal origin: Secondary | ICD-10-CM | POA: Diagnosis not present

## 2020-04-23 DIAGNOSIS — D631 Anemia in chronic kidney disease: Secondary | ICD-10-CM | POA: Diagnosis not present

## 2020-04-23 DIAGNOSIS — I82409 Acute embolism and thrombosis of unspecified deep veins of unspecified lower extremity: Secondary | ICD-10-CM | POA: Diagnosis not present

## 2020-04-23 DIAGNOSIS — N186 End stage renal disease: Secondary | ICD-10-CM | POA: Diagnosis not present

## 2020-04-25 DIAGNOSIS — N186 End stage renal disease: Secondary | ICD-10-CM | POA: Diagnosis not present

## 2020-04-25 DIAGNOSIS — D509 Iron deficiency anemia, unspecified: Secondary | ICD-10-CM | POA: Diagnosis not present

## 2020-04-25 DIAGNOSIS — D631 Anemia in chronic kidney disease: Secondary | ICD-10-CM | POA: Diagnosis not present

## 2020-04-25 DIAGNOSIS — N2581 Secondary hyperparathyroidism of renal origin: Secondary | ICD-10-CM | POA: Diagnosis not present

## 2020-04-27 DIAGNOSIS — D509 Iron deficiency anemia, unspecified: Secondary | ICD-10-CM | POA: Diagnosis not present

## 2020-04-27 DIAGNOSIS — N186 End stage renal disease: Secondary | ICD-10-CM | POA: Diagnosis not present

## 2020-04-27 DIAGNOSIS — N2581 Secondary hyperparathyroidism of renal origin: Secondary | ICD-10-CM | POA: Diagnosis not present

## 2020-04-27 DIAGNOSIS — D631 Anemia in chronic kidney disease: Secondary | ICD-10-CM | POA: Diagnosis not present

## 2020-04-30 DIAGNOSIS — D631 Anemia in chronic kidney disease: Secondary | ICD-10-CM | POA: Diagnosis not present

## 2020-04-30 DIAGNOSIS — I82409 Acute embolism and thrombosis of unspecified deep veins of unspecified lower extremity: Secondary | ICD-10-CM | POA: Diagnosis not present

## 2020-04-30 DIAGNOSIS — N2581 Secondary hyperparathyroidism of renal origin: Secondary | ICD-10-CM | POA: Diagnosis not present

## 2020-04-30 DIAGNOSIS — D509 Iron deficiency anemia, unspecified: Secondary | ICD-10-CM | POA: Diagnosis not present

## 2020-04-30 DIAGNOSIS — N186 End stage renal disease: Secondary | ICD-10-CM | POA: Diagnosis not present

## 2020-05-01 ENCOUNTER — Other Ambulatory Visit: Payer: Self-pay

## 2020-05-01 ENCOUNTER — Ambulatory Visit (INDEPENDENT_AMBULATORY_CARE_PROVIDER_SITE_OTHER): Payer: Medicare HMO

## 2020-05-01 ENCOUNTER — Encounter: Payer: Self-pay | Admitting: Family Medicine

## 2020-05-01 ENCOUNTER — Ambulatory Visit (INDEPENDENT_AMBULATORY_CARE_PROVIDER_SITE_OTHER): Payer: Medicare HMO | Admitting: Family Medicine

## 2020-05-01 VITALS — BP 124/54 | HR 89 | Ht 62.0 in | Wt 147.0 lb

## 2020-05-01 DIAGNOSIS — R0781 Pleurodynia: Secondary | ICD-10-CM | POA: Diagnosis not present

## 2020-05-01 DIAGNOSIS — R109 Unspecified abdominal pain: Secondary | ICD-10-CM

## 2020-05-01 MED ORDER — HYDROCODONE-ACETAMINOPHEN 5-325 MG PO TABS: 1.0000 | ORAL_TABLET | Freq: Four times a day (QID) | ORAL | 0 refills | Status: AC | PRN

## 2020-05-01 NOTE — Patient Instructions (Signed)
If you take the hydrocodone then do not not use your Tylenol.  Do either or every 6-8 hours as needed.

## 2020-05-01 NOTE — Progress Notes (Signed)
Acute Office Visit  Subjective:    Patient ID: Joy Anderson, female    DOB: 02-10-1950, 70 y.o.   MRN: [id number]  Chief Complaint  Patient presents with  . Generalized Body Aches         HPI Patient is in today for   Pt reports that for the past 2 wks she has experienced soreness on her R side.  She points to her side and a just a little bit posterior over the rib area.  She said that it got worse over the weekend. Also worse if she steps forward with her left ( opp) foot.  She has been taking Tylenol ES for this.  She says over the last 3 weeks she has been trying to exercise more.  She has been doing some stretching some bending twisting she has been try to walk for 30 minutes a day sheet.  She does not remember specifically injuring herself falling or hurting herself but did just notice about 2 weeks ago that the pain started.  She has not noticed any fevers chills or sweats.  No blood in the urine though she is on dialysis.  No prior history of kidney stones.  She has been having normal bowel movements.  She did try heat/ice once.  He says the pain is constant but is worsened by activity.  But it never completely goes away she feels like its been a little bit worse this week compared to last week.  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 I asked her if she contacted her orthopedist and she has not.   Past Medical History:  Diagnosis Date  . Antiphospholipid antibody with hypercoagulable state ([place]) 08/27/2010  . DVT (deep venous thrombosis) ([facility])    anti phospholipid antibody + -- Dr Lewanda Rife  . Hypertension   . OAB (overactive bladder)    off meds  . Post-menopausal   . Proteinuria    Dr Tressie Ellis  . Raynaud's disease     Past Surgical History:  Procedure Laterality Date  . AV FISTULA PLACEMENT  2016  . CATARACT EXTRACTION, BILATERAL  2017  . TOTAL ABDOMINAL HYSTERECTOMY  2001   for fibroids w/ 1 oophorectomy    Family History  Problem Relation  Age of Onset  . Alcohol abuse Father   . Colon cancer Brother   . Prostate cancer Brother   . Cerebral palsy Brother   . Diabetes Other     Social History   Socioeconomic History  . Marital status: Divorced    Spouse name: Not on file  . Number of children: Not on file  . Years of education: Not on file  . Highest education level: Not on file  Occupational History  . Not on file  Tobacco Use  . Smoking status: Never Smoker  . Smokeless tobacco: Never Used  Substance and Sexual Activity  . Alcohol use: Yes    Comment: occasional  . Drug use: No  . Sexual activity: Not on file  Other Topics Concern  . Not on file  Social History Narrative  . Not on file   Social Determinants of Health   Financial Resource Strain: Not on file  Food Insecurity: Not on file  Transportation Needs: Not on file  Physical Activity: Not on file  Stress: Not on file  Social Connections: Not on file  Intimate Partner Violence: Not on file    Outpatient Medications Prior to Visit  Medication Sig Dispense Refill  .  acetaminophen (TYLENOL) 500 MG tablet Take 500 mg by mouth every 6 (six) hours as needed. Two tablets    . amLODipine (NORVASC) 2.5 MG tablet Take 1 tablet by mouth in the evening 90 tablet 0  . atorvastatin (LIPITOR) 20 MG tablet Take 1 tablet (20 mg total) by mouth at bedtime. Due for follow up visit 90 tablet 3  . CALCIUM PO Take by mouth.    . cinacalcet (SENSIPAR) 30 MG tablet Take 2 tablets by mouth daily.    Marland Kitchen HYDROcodone-acetaminophen (NORCO/VICODIN) 5-325 MG tablet Take 1 tablet by mouth every 4 (four) hours as needed for moderate pain.    . hydrocortisone 2.5 % cream Apply 1 application topically 2 (two) times daily as needed. 45 g 1  . hydroxychloroquine (PLAQUENIL) 200 MG tablet Take 200 mg by mouth 2 (two) times daily.    . multivitamin (RENA-VIT) TABS tablet     . warfarin (COUMADIN) 1 MG tablet     . warfarin (COUMADIN) 4 MG tablet Take by mouth.    . warfarin  (COUMADIN) 5 MG tablet TAKE 1 TABLET BY MOUTH ON SUNDAY, WENESDAY, THURSDAY, FRIDAY, AND SATURDAY. THEN TAKE 1 AND 1/2 TABLET ON MONDAY AND TUESDAY (Patient taking differently: Patient states taking 2.5mg (half tablet) daily) 97 tablet 1   No facility-administered medications prior to visit.    Allergies  Allergen Reactions  . Ace Inhibitors     REACTION: cough Other reaction(s): Other (See Comments) REACTION: cough  . Metformin     Other reaction(s): Other (See Comments) Nausea, decreased appetite Nausea, decreased appetite  . Fish Oil Other (See Comments)    Other reaction(s): Other (See Comments)  . Metformin And Related Other (See Comments)    Nausea, decreased appetite    Review of Systems     Objective:    Physical Exam Constitutional:      Appearance: She is well-developed.  HENT:     Head: Normocephalic and atraumatic.  Cardiovascular:     Rate and Rhythm: Normal rate and regular rhythm.     Heart sounds: Normal heart sounds.  Pulmonary:     Effort: Pulmonary effort is normal.     Breath sounds: Normal breath sounds.  Abdominal:     General: Abdomen is flat.     Palpations: Abdomen is soft.     Tenderness: There is no abdominal tenderness.  Musculoskeletal:     Comments: Tender over the lumbar spine or SI joints.  She is tender over the right flank area over the rib cage in fact she is exquisitely tender there.  I do not see any rash or hematoma etc.  Skin:    General: Skin is warm and dry.  Neurological:     Mental Status: She is alert and oriented to person, place, and time.  Psychiatric:        Behavior: Behavior normal.     BP (!) 124/54   Pulse 89   Ht 5\' 2"  (1.575 m)   Wt 147 lb (66.7 kg)   LMP 03/18/1999   SpO2 99%   BMI 26.89 kg/m  Wt Readings from Last 3 Encounters:  05/01/20 147 lb (66.7 kg)  07/21/19 147 lb (66.7 kg)  03/03/19 145 lb (65.8 kg)    Health Maintenance Due  Topic Date Due  . DEXA SCAN  09/18/2017    There are no  preventive care reminders to display for this patient.   Lab Results  Component Value Date   TSH 2.357 04/25/2014  Lab Results  Component Value Date   WBC 7.5 09/09/2017   HGB 10.0 (A) 09/09/2017   HCT 30 (A) 09/09/2017   MCV 83.0 04/25/2014   PLT 230 09/09/2017   Lab Results  Component Value Date   NA 142 09/02/2018   K 4.2 09/02/2018   CO2 30 09/02/2018   GLUCOSE 90 09/02/2018   BUN 18 09/02/2018   CREATININE 7.79 (H) 09/02/2018   BILITOT 0.4 09/02/2018   ALKPHOS 78 09/09/2017   AST 14 09/02/2018   ALT 15 09/02/2018   PROT 7.5 09/02/2018   ALBUMIN 4.5 08/06/2016   ALBUMIN 4.5 08/06/2016   CALCIUM 9.7 09/02/2018   Lab Results  Component Value Date   CHOL 162 02/28/2020   Lab Results  Component Value Date   HDL 41 (L) 02/28/2020   Lab Results  Component Value Date   LDLCALC 90 02/28/2020   Lab Results  Component Value Date   TRIG 213 (H) 02/28/2020   Lab Results  Component Value Date   CHOLHDL 4.0 02/28/2020   Lab Results  Component Value Date   HGBA1C 5.2 02/28/2020       Assessment & Plan:   Problem List Items Addressed This Visit   None   Visit Diagnoses    Right flank pain    -  Primary   Relevant Medications   HYDROcodone-acetaminophen (NORCO/VICODIN) 5-325 MG tablet   Other Relevant Orders   DG Ribs Unilateral W/Chest Right     Pain is directly over the right flank area and is tender to palpation most consistent with a musculoskeletal injury.  Because its been 2 weeks and is gradually getting a little worse we did discuss getting an x-ray just to rule out rib fracture.  But I am less suspicious of an internal problem or injury.  Though we did discuss that if its not improving to consider doing a CBC she is actually getting get labs drawn next week with dialysis so they could certainly check it at that point in time.  No red flag symptoms.  I do wonder if she may have injured herself during exercise.  Give her a small quantity of  hydrocodone to use for her pain since the Tylenol only helps minimally.  Meds ordered this encounter  Medications  . HYDROcodone-acetaminophen (NORCO/VICODIN) 5-325 MG tablet    Sig: Take 1 tablet by mouth every 6 (six) hours as needed for up to 5 days for moderate pain.    Dispense:  30 tablet    Refill:  0     [name], MD

## 2020-05-01 NOTE — Progress Notes (Signed)
Pt reports that for the past 2 wks she has experienced soreness on her R side. She said that it got worse over the weekend. She has been taking Tylenol ES for this.   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 I asked her if she contacted her orthopedist and she has not.

## 2020-05-02 DIAGNOSIS — N186 End stage renal disease: Secondary | ICD-10-CM | POA: Diagnosis not present

## 2020-05-02 DIAGNOSIS — D631 Anemia in chronic kidney disease: Secondary | ICD-10-CM | POA: Diagnosis not present

## 2020-05-02 DIAGNOSIS — N2581 Secondary hyperparathyroidism of renal origin: Secondary | ICD-10-CM | POA: Diagnosis not present

## 2020-05-02 DIAGNOSIS — D509 Iron deficiency anemia, unspecified: Secondary | ICD-10-CM | POA: Diagnosis not present

## 2020-05-04 DIAGNOSIS — N2581 Secondary hyperparathyroidism of renal origin: Secondary | ICD-10-CM | POA: Diagnosis not present

## 2020-05-04 DIAGNOSIS — D509 Iron deficiency anemia, unspecified: Secondary | ICD-10-CM | POA: Diagnosis not present

## 2020-05-04 DIAGNOSIS — D631 Anemia in chronic kidney disease: Secondary | ICD-10-CM | POA: Diagnosis not present

## 2020-05-04 DIAGNOSIS — N186 End stage renal disease: Secondary | ICD-10-CM | POA: Diagnosis not present

## 2020-05-05 DIAGNOSIS — N186 End stage renal disease: Secondary | ICD-10-CM | POA: Diagnosis not present

## 2020-05-05 DIAGNOSIS — Z992 Dependence on renal dialysis: Secondary | ICD-10-CM | POA: Diagnosis not present

## 2020-05-07 DIAGNOSIS — D509 Iron deficiency anemia, unspecified: Secondary | ICD-10-CM | POA: Diagnosis not present

## 2020-05-07 DIAGNOSIS — N186 End stage renal disease: Secondary | ICD-10-CM | POA: Diagnosis not present

## 2020-05-07 DIAGNOSIS — D631 Anemia in chronic kidney disease: Secondary | ICD-10-CM | POA: Diagnosis not present

## 2020-05-07 DIAGNOSIS — I82409 Acute embolism and thrombosis of unspecified deep veins of unspecified lower extremity: Secondary | ICD-10-CM | POA: Diagnosis not present

## 2020-05-07 DIAGNOSIS — N2581 Secondary hyperparathyroidism of renal origin: Secondary | ICD-10-CM | POA: Diagnosis not present

## 2020-05-09 DIAGNOSIS — N2581 Secondary hyperparathyroidism of renal origin: Secondary | ICD-10-CM | POA: Diagnosis not present

## 2020-05-09 DIAGNOSIS — N186 End stage renal disease: Secondary | ICD-10-CM | POA: Diagnosis not present

## 2020-05-09 DIAGNOSIS — D631 Anemia in chronic kidney disease: Secondary | ICD-10-CM | POA: Diagnosis not present

## 2020-05-09 DIAGNOSIS — E119 Type 2 diabetes mellitus without complications: Secondary | ICD-10-CM | POA: Diagnosis not present

## 2020-05-09 DIAGNOSIS — D509 Iron deficiency anemia, unspecified: Secondary | ICD-10-CM | POA: Diagnosis not present

## 2020-05-11 DIAGNOSIS — N186 End stage renal disease: Secondary | ICD-10-CM | POA: Diagnosis not present

## 2020-05-11 DIAGNOSIS — N2581 Secondary hyperparathyroidism of renal origin: Secondary | ICD-10-CM | POA: Diagnosis not present

## 2020-05-11 DIAGNOSIS — D631 Anemia in chronic kidney disease: Secondary | ICD-10-CM | POA: Diagnosis not present

## 2020-05-11 DIAGNOSIS — D509 Iron deficiency anemia, unspecified: Secondary | ICD-10-CM | POA: Diagnosis not present

## 2020-05-14 DIAGNOSIS — D631 Anemia in chronic kidney disease: Secondary | ICD-10-CM | POA: Diagnosis not present

## 2020-05-14 DIAGNOSIS — I82409 Acute embolism and thrombosis of unspecified deep veins of unspecified lower extremity: Secondary | ICD-10-CM | POA: Diagnosis not present

## 2020-05-14 DIAGNOSIS — D509 Iron deficiency anemia, unspecified: Secondary | ICD-10-CM | POA: Diagnosis not present

## 2020-05-14 DIAGNOSIS — N186 End stage renal disease: Secondary | ICD-10-CM | POA: Diagnosis not present

## 2020-05-14 DIAGNOSIS — N2581 Secondary hyperparathyroidism of renal origin: Secondary | ICD-10-CM | POA: Diagnosis not present

## 2020-05-16 DIAGNOSIS — D631 Anemia in chronic kidney disease: Secondary | ICD-10-CM | POA: Diagnosis not present

## 2020-05-16 DIAGNOSIS — N186 End stage renal disease: Secondary | ICD-10-CM | POA: Diagnosis not present

## 2020-05-16 DIAGNOSIS — D509 Iron deficiency anemia, unspecified: Secondary | ICD-10-CM | POA: Diagnosis not present

## 2020-05-16 DIAGNOSIS — N2581 Secondary hyperparathyroidism of renal origin: Secondary | ICD-10-CM | POA: Diagnosis not present

## 2020-05-18 DIAGNOSIS — D631 Anemia in chronic kidney disease: Secondary | ICD-10-CM | POA: Diagnosis not present

## 2020-05-18 DIAGNOSIS — N2581 Secondary hyperparathyroidism of renal origin: Secondary | ICD-10-CM | POA: Diagnosis not present

## 2020-05-18 DIAGNOSIS — D509 Iron deficiency anemia, unspecified: Secondary | ICD-10-CM | POA: Diagnosis not present

## 2020-05-18 DIAGNOSIS — N186 End stage renal disease: Secondary | ICD-10-CM | POA: Diagnosis not present

## 2020-05-21 DIAGNOSIS — D631 Anemia in chronic kidney disease: Secondary | ICD-10-CM | POA: Diagnosis not present

## 2020-05-21 DIAGNOSIS — I82409 Acute embolism and thrombosis of unspecified deep veins of unspecified lower extremity: Secondary | ICD-10-CM | POA: Diagnosis not present

## 2020-05-21 DIAGNOSIS — N2581 Secondary hyperparathyroidism of renal origin: Secondary | ICD-10-CM | POA: Diagnosis not present

## 2020-05-21 DIAGNOSIS — D509 Iron deficiency anemia, unspecified: Secondary | ICD-10-CM | POA: Diagnosis not present

## 2020-05-21 DIAGNOSIS — N186 End stage renal disease: Secondary | ICD-10-CM | POA: Diagnosis not present

## 2020-05-23 DIAGNOSIS — D631 Anemia in chronic kidney disease: Secondary | ICD-10-CM | POA: Diagnosis not present

## 2020-05-23 DIAGNOSIS — N186 End stage renal disease: Secondary | ICD-10-CM | POA: Diagnosis not present

## 2020-05-23 DIAGNOSIS — D509 Iron deficiency anemia, unspecified: Secondary | ICD-10-CM | POA: Diagnosis not present

## 2020-05-23 DIAGNOSIS — N2581 Secondary hyperparathyroidism of renal origin: Secondary | ICD-10-CM | POA: Diagnosis not present

## 2020-05-25 DIAGNOSIS — D509 Iron deficiency anemia, unspecified: Secondary | ICD-10-CM | POA: Diagnosis not present

## 2020-05-25 DIAGNOSIS — N186 End stage renal disease: Secondary | ICD-10-CM | POA: Diagnosis not present

## 2020-05-25 DIAGNOSIS — D631 Anemia in chronic kidney disease: Secondary | ICD-10-CM | POA: Diagnosis not present

## 2020-05-25 DIAGNOSIS — N2581 Secondary hyperparathyroidism of renal origin: Secondary | ICD-10-CM | POA: Diagnosis not present

## 2020-05-28 DIAGNOSIS — I82409 Acute embolism and thrombosis of unspecified deep veins of unspecified lower extremity: Secondary | ICD-10-CM | POA: Diagnosis not present

## 2020-05-28 DIAGNOSIS — N186 End stage renal disease: Secondary | ICD-10-CM | POA: Diagnosis not present

## 2020-05-28 DIAGNOSIS — D631 Anemia in chronic kidney disease: Secondary | ICD-10-CM | POA: Diagnosis not present

## 2020-05-28 DIAGNOSIS — N2581 Secondary hyperparathyroidism of renal origin: Secondary | ICD-10-CM | POA: Diagnosis not present

## 2020-05-28 DIAGNOSIS — D509 Iron deficiency anemia, unspecified: Secondary | ICD-10-CM | POA: Diagnosis not present

## 2020-05-29 ENCOUNTER — Telehealth: Payer: Self-pay | Admitting: Family Medicine

## 2020-05-29 NOTE — Chronic Care Management (AMB) (Signed)
  Chronic Care Management   Outreach Note  05/29/2020 Name: Joy Anderson MRN: [id number] DOB: Dec 08, 1950  Referred by: Hali Marry, MD Reason for referral : No chief complaint on file.   An unsuccessful telephone outreach was attempted today. The patient was referred to the pharmacist for assistance with care management and care coordination.   Follow Up Plan:   Lauretta Grill Upstream Scheduler

## 2020-05-30 DIAGNOSIS — N186 End stage renal disease: Secondary | ICD-10-CM | POA: Diagnosis not present

## 2020-05-30 DIAGNOSIS — D631 Anemia in chronic kidney disease: Secondary | ICD-10-CM | POA: Diagnosis not present

## 2020-05-30 DIAGNOSIS — N2581 Secondary hyperparathyroidism of renal origin: Secondary | ICD-10-CM | POA: Diagnosis not present

## 2020-05-30 DIAGNOSIS — D509 Iron deficiency anemia, unspecified: Secondary | ICD-10-CM | POA: Diagnosis not present

## 2020-06-01 DIAGNOSIS — N186 End stage renal disease: Secondary | ICD-10-CM | POA: Diagnosis not present

## 2020-06-01 DIAGNOSIS — N2581 Secondary hyperparathyroidism of renal origin: Secondary | ICD-10-CM | POA: Diagnosis not present

## 2020-06-01 DIAGNOSIS — D509 Iron deficiency anemia, unspecified: Secondary | ICD-10-CM | POA: Diagnosis not present

## 2020-06-01 DIAGNOSIS — D631 Anemia in chronic kidney disease: Secondary | ICD-10-CM | POA: Diagnosis not present

## 2020-06-04 DIAGNOSIS — I82409 Acute embolism and thrombosis of unspecified deep veins of unspecified lower extremity: Secondary | ICD-10-CM | POA: Diagnosis not present

## 2020-06-04 DIAGNOSIS — D509 Iron deficiency anemia, unspecified: Secondary | ICD-10-CM | POA: Diagnosis not present

## 2020-06-04 DIAGNOSIS — N2581 Secondary hyperparathyroidism of renal origin: Secondary | ICD-10-CM | POA: Diagnosis not present

## 2020-06-04 DIAGNOSIS — N186 End stage renal disease: Secondary | ICD-10-CM | POA: Diagnosis not present

## 2020-06-04 DIAGNOSIS — D631 Anemia in chronic kidney disease: Secondary | ICD-10-CM | POA: Diagnosis not present

## 2020-06-05 DIAGNOSIS — Z992 Dependence on renal dialysis: Secondary | ICD-10-CM | POA: Diagnosis not present

## 2020-06-05 DIAGNOSIS — N186 End stage renal disease: Secondary | ICD-10-CM | POA: Diagnosis not present

## 2020-06-06 DIAGNOSIS — E213 Hyperparathyroidism, unspecified: Secondary | ICD-10-CM | POA: Diagnosis not present

## 2020-06-06 DIAGNOSIS — N186 End stage renal disease: Secondary | ICD-10-CM | POA: Diagnosis not present

## 2020-06-06 DIAGNOSIS — E119 Type 2 diabetes mellitus without complications: Secondary | ICD-10-CM | POA: Diagnosis not present

## 2020-06-06 DIAGNOSIS — D509 Iron deficiency anemia, unspecified: Secondary | ICD-10-CM | POA: Diagnosis not present

## 2020-06-06 DIAGNOSIS — N2581 Secondary hyperparathyroidism of renal origin: Secondary | ICD-10-CM | POA: Diagnosis not present

## 2020-06-06 DIAGNOSIS — D631 Anemia in chronic kidney disease: Secondary | ICD-10-CM | POA: Diagnosis not present

## 2020-06-08 DIAGNOSIS — E213 Hyperparathyroidism, unspecified: Secondary | ICD-10-CM | POA: Diagnosis not present

## 2020-06-08 DIAGNOSIS — D509 Iron deficiency anemia, unspecified: Secondary | ICD-10-CM | POA: Diagnosis not present

## 2020-06-08 DIAGNOSIS — N2581 Secondary hyperparathyroidism of renal origin: Secondary | ICD-10-CM | POA: Diagnosis not present

## 2020-06-08 DIAGNOSIS — N186 End stage renal disease: Secondary | ICD-10-CM | POA: Diagnosis not present

## 2020-06-08 DIAGNOSIS — D631 Anemia in chronic kidney disease: Secondary | ICD-10-CM | POA: Diagnosis not present

## 2020-06-11 DIAGNOSIS — N186 End stage renal disease: Secondary | ICD-10-CM | POA: Diagnosis not present

## 2020-06-11 DIAGNOSIS — E213 Hyperparathyroidism, unspecified: Secondary | ICD-10-CM | POA: Diagnosis not present

## 2020-06-11 DIAGNOSIS — D631 Anemia in chronic kidney disease: Secondary | ICD-10-CM | POA: Diagnosis not present

## 2020-06-11 DIAGNOSIS — D509 Iron deficiency anemia, unspecified: Secondary | ICD-10-CM | POA: Diagnosis not present

## 2020-06-11 DIAGNOSIS — I82409 Acute embolism and thrombosis of unspecified deep veins of unspecified lower extremity: Secondary | ICD-10-CM | POA: Diagnosis not present

## 2020-06-11 DIAGNOSIS — N2581 Secondary hyperparathyroidism of renal origin: Secondary | ICD-10-CM | POA: Diagnosis not present

## 2020-06-12 ENCOUNTER — Other Ambulatory Visit: Payer: Self-pay

## 2020-06-12 NOTE — Patient Outreach (Signed)
[facility]) Care Management  06/12/2020  Joy Anderson [birth year] [id number]     Telephone Assessment Quarterly Call   Unsuccessful outreach attempt to patient. No answer after multiple rings and unable to leave message.      Plan: RN CM will make quarterly outreach attempt to patient within the month of Sept.   Joy Anderson Joy Anderson [place] Management Telephonic Care Management Coordinator Direct Phone: [phone] Toll Free: [phone] Fax: [phone]

## 2020-06-13 ENCOUNTER — Telehealth: Payer: Self-pay | Admitting: Family Medicine

## 2020-06-13 DIAGNOSIS — D509 Iron deficiency anemia, unspecified: Secondary | ICD-10-CM | POA: Diagnosis not present

## 2020-06-13 DIAGNOSIS — E213 Hyperparathyroidism, unspecified: Secondary | ICD-10-CM | POA: Diagnosis not present

## 2020-06-13 DIAGNOSIS — N2581 Secondary hyperparathyroidism of renal origin: Secondary | ICD-10-CM | POA: Diagnosis not present

## 2020-06-13 DIAGNOSIS — D631 Anemia in chronic kidney disease: Secondary | ICD-10-CM | POA: Diagnosis not present

## 2020-06-13 DIAGNOSIS — N186 End stage renal disease: Secondary | ICD-10-CM | POA: Diagnosis not present

## 2020-06-13 NOTE — Chronic Care Management (AMB) (Signed)
  Chronic Care Management   Outreach Note  06/13/2020 Name: Joy Anderson MRN: [id number] DOB: 06/17/1950  Referred by: Hali Marry, MD Reason for referral : No chief complaint on file.   A second unsuccessful telephone outreach was attempted today. The patient was referred to pharmacist for assistance with care management and care coordination.  Follow Up Plan:   Lauretta Grill Upstream Scheduler

## 2020-06-14 ENCOUNTER — Encounter: Payer: Self-pay | Admitting: Emergency Medicine

## 2020-06-14 ENCOUNTER — Ambulatory Visit: Payer: Self-pay

## 2020-06-14 ENCOUNTER — Emergency Department
Admission: EM | Admit: 2020-06-14 | Discharge: 2020-06-14 | Disposition: A | Discharge status: Home / Self Care | Payer: Medicare HMO | Source: Home / Self Care

## 2020-06-14 ENCOUNTER — Other Ambulatory Visit: Payer: Self-pay

## 2020-06-14 ENCOUNTER — Emergency Department (INDEPENDENT_AMBULATORY_CARE_PROVIDER_SITE_OTHER): Payer: Medicare HMO

## 2020-06-14 DIAGNOSIS — M25562 Pain in left knee: Secondary | ICD-10-CM | POA: Diagnosis not present

## 2020-06-14 DIAGNOSIS — R0781 Pleurodynia: Secondary | ICD-10-CM

## 2020-06-14 DIAGNOSIS — M21852 Other specified acquired deformities of left thigh: Secondary | ICD-10-CM | POA: Diagnosis not present

## 2020-06-14 DIAGNOSIS — S299XXA Unspecified injury of thorax, initial encounter: Secondary | ICD-10-CM | POA: Diagnosis not present

## 2020-06-14 DIAGNOSIS — W19XXXA Unspecified fall, initial encounter: Secondary | ICD-10-CM | POA: Diagnosis not present

## 2020-06-14 MED ORDER — HYDROCODONE-ACETAMINOPHEN 5-325 MG PO TABS: 1.0000 | ORAL_TABLET | Freq: Three times a day (TID) | ORAL | 0 refills | Status: AC | PRN

## 2020-06-14 NOTE — ED Triage Notes (Signed)
Patient states that she was in the shower today and her left leg "buckled" causing her to fall and injury her left knee and left side of ribs.  Did not loose consciousness.  Patient did take a Hydrocodone for pain today.

## 2020-06-14 NOTE — Discharge Instructions (Addendum)
Advised patient may use/take pain medication as prescribed.  Advised patient to follow-up with orthopedic provider (formation provided above) tomorrow, Friday, 06/15/2020.

## 2020-06-14 NOTE — ED Provider Notes (Signed)
[facility]    CSN: [id number] Arrival date & time: 06/14/20  1644      History   Chief Complaint Chief Complaint  Patient presents with  . Fall    HPI Joy Anderson is a 70 y.o. female.   HPI 70 year old female presents for left rib pain and left knee pain.  Reports she fell in shower today ~1130 am falling up against shower wall against her left ribs and her left lower leg buckled underneath her.  She denies striking her head or losing consciousness.  Reports taking hydrocodone from previous prescription for pain.  Patient is accompanied by her daughter this afternoon.  Past Medical History:  Diagnosis Date  . Antiphospholipid antibody with hypercoagulable state ([place]) 08/27/2010  . DVT (deep venous thrombosis) ([facility])    anti phospholipid antibody + -- Dr Lewanda Rife  . Hypertension   . OAB (overactive bladder)    off meds  . Post-menopausal   . Proteinuria    Dr Tressie Ellis  . Raynaud's disease     Patient Active Problem List   Diagnosis Date Noted  . Elevated triglycerides with high cholesterol 04/13/2017  . Adnexal cyst 01/17/2017  . Osteoarthritis, hip, bilateral 10/31/2015  . Arteriovenous fistula for hemodialysis in place, primary ([place]) 04/30/2015  . ESRD on dialysis ([place]) 03/27/2015  . Uremia 10/12/2014  . Glaucoma of both eyes 07/06/2014  . Pain in joint of left hip 05/25/2012  . 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 Antiphospholipid antibody with hypercoagulable state ([place]) 08/27/2010  . 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  . Vitamin D deficiency 08/08/2010  . 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 Gout 04/17/2010  . PROPTOSIS 12/12/2009  . ANEMIA, IRON DEFICIENCY 11/20/2009  . FATTY LIVER DISEASE 10/26/2009  . PULMONARY HYPERTENSION, MILD 04/14/2008  . PALPITATIONS 03/22/2008  . RAYNAUD'S DISEASE 02/01/2008  . SLE 12/06/2007  . ESSENTIAL HYPERTENSION, BENIGN 11/30/2007  .  POLYARTHRITIS 11/30/2007    Past Surgical History:  Procedure Laterality Date  . AV FISTULA PLACEMENT  01/06/2014  . CATARACT EXTRACTION, BILATERAL  01/07/2015  . HIP SURGERY Right 10/2018   left one done 10/2019  . TOTAL ABDOMINAL HYSTERECTOMY  01/07/1999   for fibroids w/ 1 oophorectomy    OB History   No obstetric history on file.      Home Medications    Prior to Admission medications   Medication Sig Start Date End Date Taking? Authorizing Provider  amLODipine (NORVASC) 2.5 MG tablet Take 1 tablet by mouth in the evening 04/13/20  Yes Hali Marry, MD  atorvastatin (LIPITOR) 20 MG tablet Take 1 tablet (20 mg total) by mouth at bedtime. Due for follow up visit 08/19/16  Yes Hali Marry, MD  CALCIUM PO Take by mouth.   Yes [provider]  cinacalcet (SENSIPAR) 30 MG tablet Take 2 tablets by mouth daily. 11/16/15  Yes [provider]  HYDROcodone-acetaminophen (NORCO/VICODIN) 5-325 MG tablet Take 1 tablet by mouth every 4 (four) hours as needed for moderate pain.   Yes [provider]  hydrocortisone 2.5 % cream Apply 1 application topically 2 (two) times daily as needed. 03/03/19  Yes Hali Marry, MD  hydroxychloroquine (PLAQUENIL) 200 MG tablet Take 200 mg by mouth 2 (two) times daily.   Yes [provider]  multivitamin (RENA-VIT) TABS tablet  07/11/15  Yes [provider]  warfarin (COUMADIN) 1 MG tablet  07/10/15  Yes [provider]  warfarin (COUMADIN) 4 MG tablet  Take by mouth. 01/14/17  Yes [provider]  warfarin (COUMADIN) 5 MG tablet TAKE 1 TABLET BY MOUTH ON SUNDAY, WENESDAY, [place], FRIDAY, AND SATURDAY. THEN TAKE 1 AND 1/2 TABLET ON MONDAY AND TUESDAY Patient taking differently: Patient states taking 2.5mg (half tablet) daily 04/25/14  Yes Hali Marry, MD  acetaminophen (TYLENOL) 500 MG tablet Take 500 mg by mouth every 6 (six) hours as needed. Two tablets    [provider]    Family History Family History  Problem Relation Age of Onset  . Alcohol abuse Father   . Colon cancer Brother   . Prostate cancer Brother   . Cerebral palsy Brother   . Diabetes Other     Social History Social History   Tobacco Use  . Smoking status: Never  . Smokeless tobacco: Never  Substance Use Topics  . Alcohol use: Yes    Comment: occasional  . Drug use: No     Allergies   Ace inhibitors, Metformin, Fish oil, and Metformin and related   Review of Systems Review of Systems  Constitutional: Negative.   HENT: Negative.    Eyes: Negative.   Respiratory: Negative.    Cardiovascular: Negative.   Gastrointestinal: Negative.   Genitourinary: Negative.   Musculoskeletal:        Left sided rib pain and left knee pain  Skin: Negative.     Physical Exam Triage Vital Signs ED Triage Vitals  Enc Vitals Group     BP 06/14/20 1710 139/80     Pulse Rate 06/14/20 1710 92     Resp --      Temp --      Temp src --      SpO2 06/14/20 1710 98 %     Weight --      Height --      Head Circumference --      Peak Flow --      Pain Score 06/14/20 1711 10     Pain Loc --      Pain Edu? --      Excl. in [place]? --    No data found.  Updated Vital Signs BP 139/80 (BP Location: Left Arm)   Pulse 92   LMP 03/18/1999   SpO2 98%   Physical Exam Vitals and nursing note reviewed.  Constitutional:      General: She is not in acute distress.    Appearance: Normal appearance. She is normal weight. She is not ill-appearing.  HENT:     Head: Normocephalic and atraumatic.     Mouth/Throat:     Mouth: Mucous membranes are moist.     Pharynx: Oropharynx is clear.  Eyes:     Extraocular Movements: Extraocular movements intact.     Conjunctiva/sclera: Conjunctivae normal.     Pupils: Pupils are equal, round, and reactive to light.  Cardiovascular:     Rate and Rhythm: Normal rate and regular rhythm.     Pulses: Normal pulses.     Heart sounds: Normal  heart sounds.  Pulmonary:     Effort: Pulmonary effort is normal. No respiratory distress.     Breath sounds: Normal breath sounds. No wheezing, rhonchi or rales.  Musculoskeletal:     Cervical back: Normal range of motion and neck supple.     Comments: Left lateral ribs (superior aspect): TTP over fourth-sixth ribs, deformity noted  Left knee (anterior superior aspects): TTP superior medial aspect of patella, LROM with flexion/extension, exam limited due to pain  Skin:    General: Skin is warm and dry.  Neurological:     General: No focal deficit present.     Mental Status: She is alert and oriented to person, place, and time.  Psychiatric:        Mood and Affect: Mood normal.        Behavior: Behavior normal.     UC Treatments / Results  Labs (all labs ordered are listed, but only abnormal results are displayed) Labs Reviewed - No data to display  EKG   Radiology DG Ribs Unilateral W/Chest Left  Result Date: 06/14/2020 CLINICAL DATA:  Status post injury with left rib pain in left pain. EXAM: LEFT RIBS AND CHEST - 3+ VIEW COMPARISON:  May 01, 2020 FINDINGS: No fracture or other bone lesions are seen involving the ribs. There is no evidence of pneumothorax or pleural effusion. Both lungs are clear. Heart size and mediastinal contours are within normal limits. IMPRESSION: Negative. Electronically Signed   By: Abelardo Diesel M.D.   On: 06/14/2020 19:02   DG Knee Complete 4 Views Left  Result Date: 06/14/2020 CLINICAL DATA:  Status post fall with left knee pain. EXAM: LEFT KNEE - COMPLETE 4+ VIEW COMPARISON:  None. FINDINGS: There is question lucency with question minimal cortical discontinuity of the mid distal femur. There is no dislocation. IMPRESSION: There is question lucency with question minimal cortical discontinuity of the mid distal femur, fracture is not entirely excluded. Consider further evaluation with CT scan. Electronically Signed   By: Abelardo Diesel M.D.   On:  06/14/2020 19:04    Procedures Procedures (including critical care time)  Medications Ordered in UC Medications - No data to display  Initial Impression / Assessment and Plan / UC Course  I have reviewed the triage vital signs and the nursing notes.  Pertinent labs & imaging results that were available during my care of the patient were reviewed by me and considered in my medical decision making (see chart for details).    MDM: 1.  Fall, 2.  Left-sided rib pain, 3.  Left knee pain, 4.  Deformity of left femur.  Patient discharged home, hemodynamically stable advised orthopedic follow-up with CT of the left femur recommended by radiology. Final Clinical Impressions(s) / UC Diagnoses   Final diagnoses:  Fall, initial encounter  Rib pain on left side  Acute pain of left knee  Deformity of left femur   Discharge Instructions   None    ED Prescriptions   None    PDMP not reviewed this encounter.   Eliezer Lofts, Joy Anderson 06/14/20 [phone]

## 2020-06-15 ENCOUNTER — Encounter: Payer: Medicare HMO | Admitting: Sports Medicine

## 2020-06-15 DIAGNOSIS — S20212A Contusion of left front wall of thorax, initial encounter: Secondary | ICD-10-CM | POA: Diagnosis not present

## 2020-06-15 DIAGNOSIS — Z96643 Presence of artificial hip joint, bilateral: Secondary | ICD-10-CM | POA: Diagnosis not present

## 2020-06-15 DIAGNOSIS — Z95828 Presence of other vascular implants and grafts: Secondary | ICD-10-CM | POA: Diagnosis not present

## 2020-06-15 DIAGNOSIS — G8911 Acute pain due to trauma: Secondary | ICD-10-CM | POA: Diagnosis not present

## 2020-06-15 DIAGNOSIS — M25562 Pain in left knee: Secondary | ICD-10-CM | POA: Diagnosis not present

## 2020-06-15 DIAGNOSIS — Z7901 Long term (current) use of anticoagulants: Secondary | ICD-10-CM | POA: Diagnosis not present

## 2020-06-15 DIAGNOSIS — S299XXA Unspecified injury of thorax, initial encounter: Secondary | ICD-10-CM | POA: Diagnosis not present

## 2020-06-15 DIAGNOSIS — Z471 Aftercare following joint replacement surgery: Secondary | ICD-10-CM | POA: Diagnosis not present

## 2020-06-15 DIAGNOSIS — D631 Anemia in chronic kidney disease: Secondary | ICD-10-CM | POA: Diagnosis not present

## 2020-06-15 DIAGNOSIS — N186 End stage renal disease: Secondary | ICD-10-CM | POA: Diagnosis not present

## 2020-06-15 DIAGNOSIS — M329 Systemic lupus erythematosus, unspecified: Secondary | ICD-10-CM | POA: Diagnosis not present

## 2020-06-15 DIAGNOSIS — I120 Hypertensive chronic kidney disease with stage 5 chronic kidney disease or end stage renal disease: Secondary | ICD-10-CM | POA: Diagnosis not present

## 2020-06-15 DIAGNOSIS — D689 Coagulation defect, unspecified: Secondary | ICD-10-CM | POA: Diagnosis not present

## 2020-06-15 DIAGNOSIS — S8992XA Unspecified injury of left lower leg, initial encounter: Secondary | ICD-10-CM | POA: Diagnosis not present

## 2020-06-18 DIAGNOSIS — D509 Iron deficiency anemia, unspecified: Secondary | ICD-10-CM | POA: Diagnosis not present

## 2020-06-18 DIAGNOSIS — N186 End stage renal disease: Secondary | ICD-10-CM | POA: Diagnosis not present

## 2020-06-18 DIAGNOSIS — E213 Hyperparathyroidism, unspecified: Secondary | ICD-10-CM | POA: Diagnosis not present

## 2020-06-18 DIAGNOSIS — N2581 Secondary hyperparathyroidism of renal origin: Secondary | ICD-10-CM | POA: Diagnosis not present

## 2020-06-18 DIAGNOSIS — D631 Anemia in chronic kidney disease: Secondary | ICD-10-CM | POA: Diagnosis not present

## 2020-06-18 DIAGNOSIS — I82409 Acute embolism and thrombosis of unspecified deep veins of unspecified lower extremity: Secondary | ICD-10-CM | POA: Diagnosis not present

## 2020-06-20 ENCOUNTER — Other Ambulatory Visit: Payer: Self-pay

## 2020-06-20 ENCOUNTER — Ambulatory Visit (INDEPENDENT_AMBULATORY_CARE_PROVIDER_SITE_OTHER): Payer: Medicare HMO

## 2020-06-20 ENCOUNTER — Ambulatory Visit (INDEPENDENT_AMBULATORY_CARE_PROVIDER_SITE_OTHER): Payer: Medicare HMO | Admitting: Sports Medicine

## 2020-06-20 DIAGNOSIS — M25462 Effusion, left knee: Secondary | ICD-10-CM | POA: Diagnosis not present

## 2020-06-20 DIAGNOSIS — E213 Hyperparathyroidism, unspecified: Secondary | ICD-10-CM | POA: Diagnosis not present

## 2020-06-20 DIAGNOSIS — S72492A Other fracture of lower end of left femur, initial encounter for closed fracture: Secondary | ICD-10-CM

## 2020-06-20 DIAGNOSIS — N186 End stage renal disease: Secondary | ICD-10-CM | POA: Diagnosis not present

## 2020-06-20 DIAGNOSIS — M87052 Idiopathic aseptic necrosis of left femur: Secondary | ICD-10-CM | POA: Diagnosis not present

## 2020-06-20 DIAGNOSIS — M25562 Pain in left knee: Secondary | ICD-10-CM | POA: Diagnosis not present

## 2020-06-20 DIAGNOSIS — D509 Iron deficiency anemia, unspecified: Secondary | ICD-10-CM | POA: Diagnosis not present

## 2020-06-20 DIAGNOSIS — M85862 Other specified disorders of bone density and structure, left lower leg: Secondary | ICD-10-CM | POA: Diagnosis not present

## 2020-06-20 DIAGNOSIS — N2581 Secondary hyperparathyroidism of renal origin: Secondary | ICD-10-CM | POA: Diagnosis not present

## 2020-06-20 DIAGNOSIS — I739 Peripheral vascular disease, unspecified: Secondary | ICD-10-CM | POA: Diagnosis not present

## 2020-06-20 DIAGNOSIS — D631 Anemia in chronic kidney disease: Secondary | ICD-10-CM | POA: Diagnosis not present

## 2020-06-20 MED ORDER — HYDROCODONE-ACETAMINOPHEN 10-325 MG PO TABS: 1.0000 | ORAL_TABLET | Freq: Three times a day (TID) | ORAL | 0 refills | Status: DC | PRN

## 2020-06-20 NOTE — Assessment & Plan Note (Addendum)
This is a pleasant 70 year old female, end-stage renal disease on dialysis, she recently had a fall, impacted her left knee which resulted in swelling and difficulty bearing weight. She was seen in urgent care where x-rays showed a potential lucency through the distal femur at the lateral condyle, CT scan was recommended but not done. She was then seen in the emergency department, another x-ray was done, she is here for further evaluation and definitive treatment. I did review her x-rays and I do see a lucency through her lateral femoral condyle, she has tenderness at the lateral femoral condyle, significant swelling. We absolutely need the CT scan, we will do this today, adding crutches, knee immobilizer, high-dose hydrocodone. Return to see me in 1 to 2 weeks.  Update: CT does show osteoarthritis and bone infarcts without obvious fracture. We can bring her back at my next opening for consideration of an injection. We will probably also get her back with her PCP for evaluation for PAD, though this is likely all related to her ESRD on HD.

## 2020-06-20 NOTE — Progress Notes (Addendum)
    Procedures performed today:    None.  Independent interpretation of notes and tests performed by another provider:   I did personally review her knee x-rays, she does have a lucency through the lateral femoral condyle suspicious for a nondisplaced fracture.  Brief History, Exam, Impression, and Recommendations:    Bone infarct of distal femur, left with osteoarthritis This is a pleasant 70 year old female, end-stage renal disease on dialysis, she recently had a fall, impacted her left knee which resulted in swelling and difficulty bearing weight. She was seen in urgent care where x-rays showed a potential lucency through the distal femur at the lateral condyle, CT scan was recommended but not done. She was then seen in the emergency department, another x-ray was done, she is here for further evaluation and definitive treatment. I did review her x-rays and I do see a lucency through her lateral femoral condyle, she has tenderness at the lateral femoral condyle, significant swelling. We absolutely need the CT scan, we will do this today, adding crutches, knee immobilizer, high-dose hydrocodone. Return to see me in 1 to 2 weeks.  Update: CT does show osteoarthritis and bone infarcts without obvious fracture. We can bring her back at my next opening for consideration of an injection. We will probably also get her back with her PCP for evaluation for PAD, though this is likely all related to her ESRD on HD.    ___________________________________________ Gwen Her. Dianah Field, M.D., ABFM., CAQSM. Primary Care and [place] [employer] [place] [employer] [facility]

## 2020-06-22 DIAGNOSIS — D509 Iron deficiency anemia, unspecified: Secondary | ICD-10-CM | POA: Diagnosis not present

## 2020-06-22 DIAGNOSIS — D631 Anemia in chronic kidney disease: Secondary | ICD-10-CM | POA: Diagnosis not present

## 2020-06-22 DIAGNOSIS — N2581 Secondary hyperparathyroidism of renal origin: Secondary | ICD-10-CM | POA: Diagnosis not present

## 2020-06-22 DIAGNOSIS — N186 End stage renal disease: Secondary | ICD-10-CM | POA: Diagnosis not present

## 2020-06-22 DIAGNOSIS — E213 Hyperparathyroidism, unspecified: Secondary | ICD-10-CM | POA: Diagnosis not present

## 2020-06-25 DIAGNOSIS — N2581 Secondary hyperparathyroidism of renal origin: Secondary | ICD-10-CM | POA: Diagnosis not present

## 2020-06-25 DIAGNOSIS — E213 Hyperparathyroidism, unspecified: Secondary | ICD-10-CM | POA: Diagnosis not present

## 2020-06-25 DIAGNOSIS — I82409 Acute embolism and thrombosis of unspecified deep veins of unspecified lower extremity: Secondary | ICD-10-CM | POA: Diagnosis not present

## 2020-06-25 DIAGNOSIS — D631 Anemia in chronic kidney disease: Secondary | ICD-10-CM | POA: Diagnosis not present

## 2020-06-25 DIAGNOSIS — N186 End stage renal disease: Secondary | ICD-10-CM | POA: Diagnosis not present

## 2020-06-25 DIAGNOSIS — D509 Iron deficiency anemia, unspecified: Secondary | ICD-10-CM | POA: Diagnosis not present

## 2020-06-27 DIAGNOSIS — N2581 Secondary hyperparathyroidism of renal origin: Secondary | ICD-10-CM | POA: Diagnosis not present

## 2020-06-27 DIAGNOSIS — D509 Iron deficiency anemia, unspecified: Secondary | ICD-10-CM | POA: Diagnosis not present

## 2020-06-27 DIAGNOSIS — E213 Hyperparathyroidism, unspecified: Secondary | ICD-10-CM | POA: Diagnosis not present

## 2020-06-27 DIAGNOSIS — D631 Anemia in chronic kidney disease: Secondary | ICD-10-CM | POA: Diagnosis not present

## 2020-06-27 DIAGNOSIS — N186 End stage renal disease: Secondary | ICD-10-CM | POA: Diagnosis not present

## 2020-06-29 DIAGNOSIS — D509 Iron deficiency anemia, unspecified: Secondary | ICD-10-CM | POA: Diagnosis not present

## 2020-06-29 DIAGNOSIS — N2581 Secondary hyperparathyroidism of renal origin: Secondary | ICD-10-CM | POA: Diagnosis not present

## 2020-06-29 DIAGNOSIS — D631 Anemia in chronic kidney disease: Secondary | ICD-10-CM | POA: Diagnosis not present

## 2020-06-29 DIAGNOSIS — N186 End stage renal disease: Secondary | ICD-10-CM | POA: Diagnosis not present

## 2020-06-29 DIAGNOSIS — E213 Hyperparathyroidism, unspecified: Secondary | ICD-10-CM | POA: Diagnosis not present

## 2020-07-02 DIAGNOSIS — D509 Iron deficiency anemia, unspecified: Secondary | ICD-10-CM | POA: Diagnosis not present

## 2020-07-02 DIAGNOSIS — N2581 Secondary hyperparathyroidism of renal origin: Secondary | ICD-10-CM | POA: Diagnosis not present

## 2020-07-02 DIAGNOSIS — E213 Hyperparathyroidism, unspecified: Secondary | ICD-10-CM | POA: Diagnosis not present

## 2020-07-02 DIAGNOSIS — I82409 Acute embolism and thrombosis of unspecified deep veins of unspecified lower extremity: Secondary | ICD-10-CM | POA: Diagnosis not present

## 2020-07-02 DIAGNOSIS — N186 End stage renal disease: Secondary | ICD-10-CM | POA: Diagnosis not present

## 2020-07-02 DIAGNOSIS — D631 Anemia in chronic kidney disease: Secondary | ICD-10-CM | POA: Diagnosis not present

## 2020-07-03 ENCOUNTER — Ambulatory Visit: Payer: Medicare HMO | Admitting: Sports Medicine

## 2020-07-03 ENCOUNTER — Telehealth: Payer: Self-pay | Admitting: Family Medicine

## 2020-07-03 NOTE — Chronic Care Management (AMB) (Signed)
  Chronic Care Management   Outreach Note  07/03/2020 Name: Joy Anderson MRN: [id number] DOB: [birth year]  Referred by: Hali Marry, MD Reason for referral : No chief complaint on file.   Third unsuccessful telephone outreach was attempted today. The patient was referred to the pharmacist for assistance with care management and care coordination.   Follow Up Plan:   Lauretta Grill Upstream Scheduler

## 2020-07-04 DIAGNOSIS — D509 Iron deficiency anemia, unspecified: Secondary | ICD-10-CM | POA: Diagnosis not present

## 2020-07-04 DIAGNOSIS — D631 Anemia in chronic kidney disease: Secondary | ICD-10-CM | POA: Diagnosis not present

## 2020-07-04 DIAGNOSIS — E213 Hyperparathyroidism, unspecified: Secondary | ICD-10-CM | POA: Diagnosis not present

## 2020-07-04 DIAGNOSIS — N186 End stage renal disease: Secondary | ICD-10-CM | POA: Diagnosis not present

## 2020-07-04 DIAGNOSIS — N2581 Secondary hyperparathyroidism of renal origin: Secondary | ICD-10-CM | POA: Diagnosis not present

## 2020-07-05 DIAGNOSIS — Z992 Dependence on renal dialysis: Secondary | ICD-10-CM | POA: Diagnosis not present

## 2020-07-05 DIAGNOSIS — N186 End stage renal disease: Secondary | ICD-10-CM | POA: Diagnosis not present

## 2020-07-06 DIAGNOSIS — D631 Anemia in chronic kidney disease: Secondary | ICD-10-CM | POA: Diagnosis not present

## 2020-07-06 DIAGNOSIS — N186 End stage renal disease: Secondary | ICD-10-CM | POA: Diagnosis not present

## 2020-07-06 DIAGNOSIS — N2581 Secondary hyperparathyroidism of renal origin: Secondary | ICD-10-CM | POA: Diagnosis not present

## 2020-07-06 DIAGNOSIS — D509 Iron deficiency anemia, unspecified: Secondary | ICD-10-CM | POA: Diagnosis not present

## 2020-07-09 DIAGNOSIS — D631 Anemia in chronic kidney disease: Secondary | ICD-10-CM | POA: Diagnosis not present

## 2020-07-09 DIAGNOSIS — D509 Iron deficiency anemia, unspecified: Secondary | ICD-10-CM | POA: Diagnosis not present

## 2020-07-09 DIAGNOSIS — N2581 Secondary hyperparathyroidism of renal origin: Secondary | ICD-10-CM | POA: Diagnosis not present

## 2020-07-09 DIAGNOSIS — N186 End stage renal disease: Secondary | ICD-10-CM | POA: Diagnosis not present

## 2020-07-09 DIAGNOSIS — I82409 Acute embolism and thrombosis of unspecified deep veins of unspecified lower extremity: Secondary | ICD-10-CM | POA: Diagnosis not present

## 2020-07-11 DIAGNOSIS — N186 End stage renal disease: Secondary | ICD-10-CM | POA: Diagnosis not present

## 2020-07-11 DIAGNOSIS — D631 Anemia in chronic kidney disease: Secondary | ICD-10-CM | POA: Diagnosis not present

## 2020-07-11 DIAGNOSIS — N2581 Secondary hyperparathyroidism of renal origin: Secondary | ICD-10-CM | POA: Diagnosis not present

## 2020-07-11 DIAGNOSIS — E119 Type 2 diabetes mellitus without complications: Secondary | ICD-10-CM | POA: Diagnosis not present

## 2020-07-11 DIAGNOSIS — D509 Iron deficiency anemia, unspecified: Secondary | ICD-10-CM | POA: Diagnosis not present

## 2020-07-12 ENCOUNTER — Other Ambulatory Visit: Payer: Self-pay | Admitting: Family Medicine

## 2020-07-12 DIAGNOSIS — I10 Essential (primary) hypertension: Secondary | ICD-10-CM

## 2020-07-13 DIAGNOSIS — N2581 Secondary hyperparathyroidism of renal origin: Secondary | ICD-10-CM | POA: Diagnosis not present

## 2020-07-13 DIAGNOSIS — D631 Anemia in chronic kidney disease: Secondary | ICD-10-CM | POA: Diagnosis not present

## 2020-07-13 DIAGNOSIS — D509 Iron deficiency anemia, unspecified: Secondary | ICD-10-CM | POA: Diagnosis not present

## 2020-07-13 DIAGNOSIS — N186 End stage renal disease: Secondary | ICD-10-CM | POA: Diagnosis not present

## 2020-07-16 DIAGNOSIS — N2581 Secondary hyperparathyroidism of renal origin: Secondary | ICD-10-CM | POA: Diagnosis not present

## 2020-07-16 DIAGNOSIS — D631 Anemia in chronic kidney disease: Secondary | ICD-10-CM | POA: Diagnosis not present

## 2020-07-16 DIAGNOSIS — I82409 Acute embolism and thrombosis of unspecified deep veins of unspecified lower extremity: Secondary | ICD-10-CM | POA: Diagnosis not present

## 2020-07-16 DIAGNOSIS — N186 End stage renal disease: Secondary | ICD-10-CM | POA: Diagnosis not present

## 2020-07-16 DIAGNOSIS — D509 Iron deficiency anemia, unspecified: Secondary | ICD-10-CM | POA: Diagnosis not present

## 2020-07-18 DIAGNOSIS — D631 Anemia in chronic kidney disease: Secondary | ICD-10-CM | POA: Diagnosis not present

## 2020-07-18 DIAGNOSIS — N2581 Secondary hyperparathyroidism of renal origin: Secondary | ICD-10-CM | POA: Diagnosis not present

## 2020-07-18 DIAGNOSIS — D509 Iron deficiency anemia, unspecified: Secondary | ICD-10-CM | POA: Diagnosis not present

## 2020-07-18 DIAGNOSIS — N186 End stage renal disease: Secondary | ICD-10-CM | POA: Diagnosis not present

## 2020-07-20 DIAGNOSIS — N186 End stage renal disease: Secondary | ICD-10-CM | POA: Diagnosis not present

## 2020-07-20 DIAGNOSIS — D631 Anemia in chronic kidney disease: Secondary | ICD-10-CM | POA: Diagnosis not present

## 2020-07-20 DIAGNOSIS — D509 Iron deficiency anemia, unspecified: Secondary | ICD-10-CM | POA: Diagnosis not present

## 2020-07-20 DIAGNOSIS — N2581 Secondary hyperparathyroidism of renal origin: Secondary | ICD-10-CM | POA: Diagnosis not present

## 2020-07-23 DIAGNOSIS — N2581 Secondary hyperparathyroidism of renal origin: Secondary | ICD-10-CM | POA: Diagnosis not present

## 2020-07-23 DIAGNOSIS — N186 End stage renal disease: Secondary | ICD-10-CM | POA: Diagnosis not present

## 2020-07-23 DIAGNOSIS — I82409 Acute embolism and thrombosis of unspecified deep veins of unspecified lower extremity: Secondary | ICD-10-CM | POA: Diagnosis not present

## 2020-07-23 DIAGNOSIS — D631 Anemia in chronic kidney disease: Secondary | ICD-10-CM | POA: Diagnosis not present

## 2020-07-23 DIAGNOSIS — D509 Iron deficiency anemia, unspecified: Secondary | ICD-10-CM | POA: Diagnosis not present

## 2020-07-24 ENCOUNTER — Other Ambulatory Visit: Payer: Self-pay

## 2020-07-24 ENCOUNTER — Encounter: Payer: Self-pay | Admitting: Family Medicine

## 2020-07-24 ENCOUNTER — Ambulatory Visit (INDEPENDENT_AMBULATORY_CARE_PROVIDER_SITE_OTHER): Payer: Medicare HMO | Admitting: Family Medicine

## 2020-07-24 VITALS — BP 138/57 | HR 88 | Ht 62.0 in | Wt 143.0 lb

## 2020-07-24 DIAGNOSIS — M87052 Idiopathic aseptic necrosis of left femur: Secondary | ICD-10-CM | POA: Diagnosis not present

## 2020-07-24 DIAGNOSIS — I10 Essential (primary) hypertension: Secondary | ICD-10-CM | POA: Diagnosis not present

## 2020-07-24 DIAGNOSIS — H938X3 Other specified disorders of ear, bilateral: Secondary | ICD-10-CM

## 2020-07-24 DIAGNOSIS — R0989 Other specified symptoms and signs involving the circulatory and respiratory systems: Secondary | ICD-10-CM

## 2020-07-24 MED ORDER — IPRATROPIUM BROMIDE 0.03 % NA SOLN: 2.0000 | Freq: Two times a day (BID) | NASAL | 2 refills | Status: DC | PRN

## 2020-07-24 MED ORDER — AMBULATORY NON FORMULARY MEDICATION: 0 refills | Status: AC

## 2020-07-24 MED ORDER — HYDROCORTISONE 2.5 % EX CREA: 1.0000 "application " | TOPICAL_CREAM | Freq: Two times a day (BID) | CUTANEOUS | 1 refills | Status: DC | PRN

## 2020-07-24 NOTE — Assessment & Plan Note (Signed)
Well controlled. Continue current regimen. Follow up in  6 mo  

## 2020-07-24 NOTE — Progress Notes (Signed)
Established Patient Office Visit  Subjective:  Patient ID: Joy Anderson, female    DOB: 1950/09/13  Age: 70 y.o. MRN: [id number]  CC:  Chief Complaint  Patient presents with   Hypertension    HPI OMUNIQUE PEDERSON presents for   Hypertension- Pt denies chest pain, SOB, dizziness, or heart palpitations.  Taking meds as directed w/o problems.  Denies medication side effects.    She fell on 6/10 and injured her ribs and left knee.  She slipped in the shower and would like a shower chair.  She says she is getting better but is still having to use her cane she has been limping a little bit she says she is been try to do some stretches on her own but she is not in formal physical therapy.  She did have a CT of that left knee.  She did not actually fractured her knee but did have some bone infarcts as well as some arthritis.  She does complain of getting some clear drainage in her nose and the back of her throat for probably a couple of months.  No discolored mucus or discharge fevers or chills.  No recent upper respiratory symptoms.  It is just more annoying than anything  She also reports hearing her pulse particularly in her right ear but sometimes in both ears.  Past Medical History:  Diagnosis Date   Antiphospholipid antibody with hypercoagulable state ([place]) 08/27/2010   DVT (deep venous thrombosis) (HCC)    anti phospholipid antibody + -- Dr Lewanda Rife   Hypertension    OAB (overactive bladder)    off meds   Post-menopausal    Proteinuria    Dr Tressie Ellis   Raynaud's disease     Past Surgical History:  Procedure Laterality Date   AV FISTULA PLACEMENT  01/06/2014   CATARACT EXTRACTION, BILATERAL  01/07/2015   HIP SURGERY Right 10/2018   left one done 10/2019   TOTAL ABDOMINAL HYSTERECTOMY  01/07/1999   for fibroids w/ 1 oophorectomy    Family History  Problem Relation Age of Onset   Alcohol abuse Father    Colon cancer Brother    Prostate cancer Brother    Cerebral palsy  Brother    Diabetes Other     Social History   Socioeconomic History   Marital status: Divorced    Spouse name: Not on file   Number of children: Not on file   Years of education: Not on file   Highest education level: Not on file  Occupational History   Not on file  Tobacco Use   Smoking status: Never   Smokeless tobacco: Never  Substance and Sexual Activity   Alcohol use: Yes    Comment: occasional   Drug use: No   Sexual activity: Not on file  Other Topics Concern   Not on file  Social History Narrative   Not on file   Social Determinants of Health   Financial Resource Strain: Not on file  Food Insecurity: Not on file  Transportation Needs: Not on file  Physical Activity: Not on file  Stress: Not on file  Social Connections: Not on file  Intimate Partner Violence: Not on file    Outpatient Medications Prior to Visit  Medication Sig Dispense Refill   acetaminophen (TYLENOL) 500 MG tablet Take 500 mg by mouth every 6 (six) hours as needed. Two tablets     amLODipine (NORVASC) 2.5 MG tablet Take 1 tablet by mouth in the evening 90  tablet 0   atorvastatin (LIPITOR) 20 MG tablet Take 1 tablet (20 mg total) by mouth at bedtime. Due for follow up visit 90 tablet 3   CALCIUM PO Take by mouth.     cinacalcet (SENSIPAR) 30 MG tablet Take 2 tablets by mouth daily.     hydroxychloroquine (PLAQUENIL) 200 MG tablet Take 200 mg by mouth 2 (two) times daily.     multivitamin (RENA-VIT) TABS tablet      warfarin (COUMADIN) 1 MG tablet      warfarin (COUMADIN) 4 MG tablet Take by mouth.     warfarin (COUMADIN) 5 MG tablet TAKE 1 TABLET BY MOUTH ON SUNDAY, WENESDAY, THURSDAY, FRIDAY, AND SATURDAY. THEN TAKE 1 AND 1/2 TABLET ON MONDAY AND TUESDAY (Patient taking differently: Patient states taking 2.5mg (half tablet) daily) 97 tablet 1   HYDROcodone-acetaminophen (NORCO) 10-325 MG tablet Take 1 tablet by mouth every 8 (eight) hours as needed. 15 tablet 0   hydrocortisone 2.5 % cream  Apply 1 application topically 2 (two) times daily as needed. 45 g 1   No facility-administered medications prior to visit.    Allergies  Allergen Reactions   Ace Inhibitors     REACTION: cough Other reaction(s): Other (See Comments) REACTION: cough   Metformin     Other reaction(s): Other (See Comments) Nausea, decreased appetite Nausea, decreased appetite   Fish Oil Other (See Comments)    Other reaction(s): Other (See Comments)   Metformin And Related Other (See Comments)    Nausea, decreased appetite    ROS Review of Systems    Objective:    Physical Exam Constitutional:      Appearance: Normal appearance. She is well-developed.  HENT:     Head: Normocephalic and atraumatic.     Right Ear: Tympanic membrane, ear canal and external ear normal.     Left Ear: Tympanic membrane, ear canal and external ear normal.     Nose: Nose normal.     Mouth/Throat:     Mouth: Mucous membranes are moist.     Pharynx: Oropharynx is clear. No oropharyngeal exudate or posterior oropharyngeal erythema.     Comments: Dentures in place. Eyes:     Conjunctiva/sclera: Conjunctivae normal.     Pupils: Pupils are equal, round, and reactive to light.  Neck:     Thyroid: No thyromegaly.  Cardiovascular:     Rate and Rhythm: Normal rate and regular rhythm.     Heart sounds: Normal heart sounds.  Pulmonary:     Effort: Pulmonary effort is normal.     Breath sounds: Normal breath sounds. No wheezing.  Musculoskeletal:     Cervical back: Neck supple.  Lymphadenopathy:     Cervical: No cervical adenopathy.  Skin:    General: Skin is warm and dry.  Neurological:     Mental Status: She is alert and oriented to person, place, and time.  Psychiatric:        Behavior: Behavior normal.    BP (!) 138/57   Pulse 88   Ht 5\' 2"  (1.575 m)   Wt 143 lb (64.9 kg)   LMP 03/18/1999   SpO2 100%   BMI 26.16 kg/m  Wt Readings from Last 3 Encounters:  07/24/20 143 lb (64.9 kg)  05/01/20 147 lb  (66.7 kg)  07/21/19 147 lb (66.7 kg)     Health Maintenance Due  Topic Date Due   Zoster Vaccines- Shingrix (1 of 2) Never done   DEXA SCAN  09/18/2017   COVID-19  Vaccine (4 - Booster for Moderna series) 01/07/2020    There are no preventive care reminders to display for this patient.  Lab Results  Component Value Date   TSH 2.357 04/25/2014   Lab Results  Component Value Date   WBC 7.5 09/09/2017   HGB 10.0 (A) 09/09/2017   HCT 30 (A) 09/09/2017   MCV 83.0 04/25/2014   PLT 230 09/09/2017   Lab Results  Component Value Date   NA 142 09/02/2018   K 4.2 09/02/2018   CO2 30 09/02/2018   GLUCOSE 90 09/02/2018   BUN 18 09/02/2018   CREATININE 7.79 (H) 09/02/2018   BILITOT 0.4 09/02/2018   ALKPHOS 78 09/09/2017   AST 14 09/02/2018   ALT 15 09/02/2018   PROT 7.5 09/02/2018   ALBUMIN 4.5 08/06/2016   ALBUMIN 4.5 08/06/2016   CALCIUM 9.7 09/02/2018   Lab Results  Component Value Date   CHOL 162 02/28/2020   Lab Results  Component Value Date   HDL 41 (L) 02/28/2020   Lab Results  Component Value Date   LDLCALC 90 02/28/2020   Lab Results  Component Value Date   TRIG 213 (H) 02/28/2020   Lab Results  Component Value Date   CHOLHDL 4.0 02/28/2020   Lab Results  Component Value Date   HGBA1C 5.2 02/28/2020      Assessment & Plan:   Problem List Items Addressed This Visit       Cardiovascular and Mediastinum   ESSENTIAL HYPERTENSION, BENIGN - Primary     Well controlled. Continue current regimen. Follow up in  6 mo          Musculoskeletal and Integument   Bone infarct of distal femur, left with osteoarthritis   Other Visit Diagnoses     Audible heartbeat in ear, bilateral       Relevant Orders   US Carotid Duplex Bilateral   Right carotid bruit       Relevant Orders   US Carotid Duplex Bilateral   Runny nose       Relevant Medications   ipratropium (ATROVENT) 0.03 % nasal spray       Recent fall in the shower she would like a  prescription for shower to chair.  Printed and provided today.  Audible heartbeat in ears with right carotid bruit on exam today we will schedule for carotid Dopplers for further work-up.  Left knee pain secondary to distal infarct-encouraged her to follow-up with Dr. Darene Lamer if she continues to have pain I do think she might benefit from some formal physical therapy though she still limping and weak in that left leg.  Postnasal drip is mostly clear.  Could be allergy related though she is not getting any itching or watery eyes.  Consider a trial of Atrovent nasal spray.  Meds ordered this encounter  Medications   hydrocortisone 2.5 % cream    Sig: Apply 1 application topically 2 (two) times daily as needed.    Dispense:  45 g    Refill:  1   AMBULATORY NON FORMULARY MEDICATION    Sig: Medication Name: Shower chair    Dispense:  1 each    Refill:  0   ipratropium (ATROVENT) 0.03 % nasal spray    Sig: Place 2 sprays into both nostrils 2 (two) times daily as needed for rhinitis. For runny nose    Dispense:  30 mL    Refill:  2     Follow-up: Return in about 6 months (  around 01/24/2021) for bp.    [name], MD

## 2020-07-25 DIAGNOSIS — N2581 Secondary hyperparathyroidism of renal origin: Secondary | ICD-10-CM | POA: Diagnosis not present

## 2020-07-25 DIAGNOSIS — D509 Iron deficiency anemia, unspecified: Secondary | ICD-10-CM | POA: Diagnosis not present

## 2020-07-25 DIAGNOSIS — N186 End stage renal disease: Secondary | ICD-10-CM | POA: Diagnosis not present

## 2020-07-25 DIAGNOSIS — D631 Anemia in chronic kidney disease: Secondary | ICD-10-CM | POA: Diagnosis not present

## 2020-07-27 DIAGNOSIS — D509 Iron deficiency anemia, unspecified: Secondary | ICD-10-CM | POA: Diagnosis not present

## 2020-07-27 DIAGNOSIS — D631 Anemia in chronic kidney disease: Secondary | ICD-10-CM | POA: Diagnosis not present

## 2020-07-27 DIAGNOSIS — N186 End stage renal disease: Secondary | ICD-10-CM | POA: Diagnosis not present

## 2020-07-27 DIAGNOSIS — N2581 Secondary hyperparathyroidism of renal origin: Secondary | ICD-10-CM | POA: Diagnosis not present

## 2020-07-30 DIAGNOSIS — I82409 Acute embolism and thrombosis of unspecified deep veins of unspecified lower extremity: Secondary | ICD-10-CM | POA: Diagnosis not present

## 2020-07-30 DIAGNOSIS — N2581 Secondary hyperparathyroidism of renal origin: Secondary | ICD-10-CM | POA: Diagnosis not present

## 2020-07-30 DIAGNOSIS — D509 Iron deficiency anemia, unspecified: Secondary | ICD-10-CM | POA: Diagnosis not present

## 2020-07-30 DIAGNOSIS — D631 Anemia in chronic kidney disease: Secondary | ICD-10-CM | POA: Diagnosis not present

## 2020-07-30 DIAGNOSIS — N186 End stage renal disease: Secondary | ICD-10-CM | POA: Diagnosis not present

## 2020-08-01 DIAGNOSIS — D509 Iron deficiency anemia, unspecified: Secondary | ICD-10-CM | POA: Diagnosis not present

## 2020-08-01 DIAGNOSIS — N2581 Secondary hyperparathyroidism of renal origin: Secondary | ICD-10-CM | POA: Diagnosis not present

## 2020-08-01 DIAGNOSIS — D631 Anemia in chronic kidney disease: Secondary | ICD-10-CM | POA: Diagnosis not present

## 2020-08-01 DIAGNOSIS — N186 End stage renal disease: Secondary | ICD-10-CM | POA: Diagnosis not present

## 2020-08-01 DIAGNOSIS — R195 Other fecal abnormalities: Secondary | ICD-10-CM | POA: Diagnosis not present

## 2020-08-01 DIAGNOSIS — D649 Anemia, unspecified: Secondary | ICD-10-CM | POA: Diagnosis not present

## 2020-08-02 DIAGNOSIS — K449 Diaphragmatic hernia without obstruction or gangrene: Secondary | ICD-10-CM | POA: Diagnosis not present

## 2020-08-02 DIAGNOSIS — E785 Hyperlipidemia, unspecified: Secondary | ICD-10-CM | POA: Diagnosis not present

## 2020-08-02 DIAGNOSIS — I7300 Raynaud's syndrome without gangrene: Secondary | ICD-10-CM | POA: Diagnosis not present

## 2020-08-02 DIAGNOSIS — Z86718 Personal history of other venous thrombosis and embolism: Secondary | ICD-10-CM | POA: Diagnosis not present

## 2020-08-02 DIAGNOSIS — Z9102 Food additives allergy status: Secondary | ICD-10-CM | POA: Diagnosis not present

## 2020-08-02 DIAGNOSIS — D509 Iron deficiency anemia, unspecified: Secondary | ICD-10-CM | POA: Diagnosis not present

## 2020-08-02 DIAGNOSIS — N186 End stage renal disease: Secondary | ICD-10-CM | POA: Diagnosis not present

## 2020-08-02 DIAGNOSIS — R195 Other fecal abnormalities: Secondary | ICD-10-CM | POA: Diagnosis not present

## 2020-08-02 DIAGNOSIS — I120 Hypertensive chronic kidney disease with stage 5 chronic kidney disease or end stage renal disease: Secondary | ICD-10-CM | POA: Diagnosis not present

## 2020-08-02 DIAGNOSIS — E119 Type 2 diabetes mellitus without complications: Secondary | ICD-10-CM | POA: Diagnosis not present

## 2020-08-02 DIAGNOSIS — Z992 Dependence on renal dialysis: Secondary | ICD-10-CM | POA: Diagnosis not present

## 2020-08-02 DIAGNOSIS — D649 Anemia, unspecified: Secondary | ICD-10-CM | POA: Diagnosis not present

## 2020-08-02 DIAGNOSIS — Z888 Allergy status to other drugs, medicaments and biological substances status: Secondary | ICD-10-CM | POA: Diagnosis not present

## 2020-08-02 DIAGNOSIS — M329 Systemic lupus erythematosus, unspecified: Secondary | ICD-10-CM | POA: Diagnosis not present

## 2020-08-02 DIAGNOSIS — I10 Essential (primary) hypertension: Secondary | ICD-10-CM | POA: Diagnosis not present

## 2020-08-03 DIAGNOSIS — D631 Anemia in chronic kidney disease: Secondary | ICD-10-CM | POA: Diagnosis not present

## 2020-08-03 DIAGNOSIS — N186 End stage renal disease: Secondary | ICD-10-CM | POA: Diagnosis not present

## 2020-08-03 DIAGNOSIS — D509 Iron deficiency anemia, unspecified: Secondary | ICD-10-CM | POA: Diagnosis not present

## 2020-08-03 DIAGNOSIS — N2581 Secondary hyperparathyroidism of renal origin: Secondary | ICD-10-CM | POA: Diagnosis not present

## 2020-08-04 DIAGNOSIS — H938X3 Other specified disorders of ear, bilateral: Secondary | ICD-10-CM | POA: Diagnosis not present

## 2020-08-04 DIAGNOSIS — R0989 Other specified symptoms and signs involving the circulatory and respiratory systems: Secondary | ICD-10-CM | POA: Diagnosis not present

## 2020-08-05 DIAGNOSIS — Z992 Dependence on renal dialysis: Secondary | ICD-10-CM | POA: Diagnosis not present

## 2020-08-05 DIAGNOSIS — N186 End stage renal disease: Secondary | ICD-10-CM | POA: Diagnosis not present

## 2020-08-06 DIAGNOSIS — D509 Iron deficiency anemia, unspecified: Secondary | ICD-10-CM | POA: Diagnosis not present

## 2020-08-06 DIAGNOSIS — I82409 Acute embolism and thrombosis of unspecified deep veins of unspecified lower extremity: Secondary | ICD-10-CM | POA: Diagnosis not present

## 2020-08-06 DIAGNOSIS — N186 End stage renal disease: Secondary | ICD-10-CM | POA: Diagnosis not present

## 2020-08-06 DIAGNOSIS — D631 Anemia in chronic kidney disease: Secondary | ICD-10-CM | POA: Diagnosis not present

## 2020-08-06 DIAGNOSIS — N2581 Secondary hyperparathyroidism of renal origin: Secondary | ICD-10-CM | POA: Diagnosis not present

## 2020-08-08 DIAGNOSIS — N186 End stage renal disease: Secondary | ICD-10-CM | POA: Diagnosis not present

## 2020-08-08 DIAGNOSIS — N2581 Secondary hyperparathyroidism of renal origin: Secondary | ICD-10-CM | POA: Diagnosis not present

## 2020-08-08 DIAGNOSIS — E119 Type 2 diabetes mellitus without complications: Secondary | ICD-10-CM | POA: Diagnosis not present

## 2020-08-08 DIAGNOSIS — D509 Iron deficiency anemia, unspecified: Secondary | ICD-10-CM | POA: Diagnosis not present

## 2020-08-08 DIAGNOSIS — D631 Anemia in chronic kidney disease: Secondary | ICD-10-CM | POA: Diagnosis not present

## 2020-08-09 DIAGNOSIS — M329 Systemic lupus erythematosus, unspecified: Secondary | ICD-10-CM | POA: Diagnosis not present

## 2020-08-09 DIAGNOSIS — R760 Raised antibody titer: Secondary | ICD-10-CM | POA: Diagnosis not present

## 2020-08-09 DIAGNOSIS — I7300 Raynaud's syndrome without gangrene: Secondary | ICD-10-CM | POA: Diagnosis not present

## 2020-08-09 DIAGNOSIS — D6862 Lupus anticoagulant syndrome: Secondary | ICD-10-CM | POA: Diagnosis not present

## 2020-08-09 DIAGNOSIS — N186 End stage renal disease: Secondary | ICD-10-CM | POA: Diagnosis not present

## 2020-08-10 DIAGNOSIS — D509 Iron deficiency anemia, unspecified: Secondary | ICD-10-CM | POA: Diagnosis not present

## 2020-08-10 DIAGNOSIS — D631 Anemia in chronic kidney disease: Secondary | ICD-10-CM | POA: Diagnosis not present

## 2020-08-10 DIAGNOSIS — N186 End stage renal disease: Secondary | ICD-10-CM | POA: Diagnosis not present

## 2020-08-10 DIAGNOSIS — N2581 Secondary hyperparathyroidism of renal origin: Secondary | ICD-10-CM | POA: Diagnosis not present

## 2020-08-13 DIAGNOSIS — N186 End stage renal disease: Secondary | ICD-10-CM | POA: Diagnosis not present

## 2020-08-13 DIAGNOSIS — I82409 Acute embolism and thrombosis of unspecified deep veins of unspecified lower extremity: Secondary | ICD-10-CM | POA: Diagnosis not present

## 2020-08-13 DIAGNOSIS — N2581 Secondary hyperparathyroidism of renal origin: Secondary | ICD-10-CM | POA: Diagnosis not present

## 2020-08-13 DIAGNOSIS — D509 Iron deficiency anemia, unspecified: Secondary | ICD-10-CM | POA: Diagnosis not present

## 2020-08-13 DIAGNOSIS — D631 Anemia in chronic kidney disease: Secondary | ICD-10-CM | POA: Diagnosis not present

## 2020-08-15 DIAGNOSIS — Z96641 Presence of right artificial hip joint: Secondary | ICD-10-CM | POA: Diagnosis not present

## 2020-08-15 DIAGNOSIS — R059 Cough, unspecified: Secondary | ICD-10-CM | POA: Diagnosis not present

## 2020-08-15 DIAGNOSIS — N2581 Secondary hyperparathyroidism of renal origin: Secondary | ICD-10-CM | POA: Diagnosis not present

## 2020-08-15 DIAGNOSIS — D631 Anemia in chronic kidney disease: Secondary | ICD-10-CM | POA: Diagnosis not present

## 2020-08-15 DIAGNOSIS — J208 Acute bronchitis due to other specified organisms: Secondary | ICD-10-CM | POA: Diagnosis not present

## 2020-08-15 DIAGNOSIS — Z20822 Contact with and (suspected) exposure to covid-19: Secondary | ICD-10-CM | POA: Diagnosis not present

## 2020-08-15 DIAGNOSIS — R791 Abnormal coagulation profile: Secondary | ICD-10-CM | POA: Diagnosis not present

## 2020-08-15 DIAGNOSIS — I120 Hypertensive chronic kidney disease with stage 5 chronic kidney disease or end stage renal disease: Secondary | ICD-10-CM | POA: Diagnosis not present

## 2020-08-15 DIAGNOSIS — D509 Iron deficiency anemia, unspecified: Secondary | ICD-10-CM | POA: Diagnosis not present

## 2020-08-15 DIAGNOSIS — N186 End stage renal disease: Secondary | ICD-10-CM | POA: Diagnosis not present

## 2020-08-15 DIAGNOSIS — Z86718 Personal history of other venous thrombosis and embolism: Secondary | ICD-10-CM | POA: Diagnosis not present

## 2020-08-15 DIAGNOSIS — B9689 Other specified bacterial agents as the cause of diseases classified elsewhere: Secondary | ICD-10-CM | POA: Diagnosis not present

## 2020-08-15 DIAGNOSIS — Z992 Dependence on renal dialysis: Secondary | ICD-10-CM | POA: Diagnosis not present

## 2020-08-17 DIAGNOSIS — D631 Anemia in chronic kidney disease: Secondary | ICD-10-CM | POA: Diagnosis not present

## 2020-08-17 DIAGNOSIS — D509 Iron deficiency anemia, unspecified: Secondary | ICD-10-CM | POA: Diagnosis not present

## 2020-08-17 DIAGNOSIS — N2581 Secondary hyperparathyroidism of renal origin: Secondary | ICD-10-CM | POA: Diagnosis not present

## 2020-08-17 DIAGNOSIS — N186 End stage renal disease: Secondary | ICD-10-CM | POA: Diagnosis not present

## 2020-08-20 DIAGNOSIS — D631 Anemia in chronic kidney disease: Secondary | ICD-10-CM | POA: Diagnosis not present

## 2020-08-20 DIAGNOSIS — N2581 Secondary hyperparathyroidism of renal origin: Secondary | ICD-10-CM | POA: Diagnosis not present

## 2020-08-20 DIAGNOSIS — N186 End stage renal disease: Secondary | ICD-10-CM | POA: Diagnosis not present

## 2020-08-20 DIAGNOSIS — D509 Iron deficiency anemia, unspecified: Secondary | ICD-10-CM | POA: Diagnosis not present

## 2020-08-20 DIAGNOSIS — I82409 Acute embolism and thrombosis of unspecified deep veins of unspecified lower extremity: Secondary | ICD-10-CM | POA: Diagnosis not present

## 2020-08-21 ENCOUNTER — Telehealth: Payer: Self-pay | Admitting: Family Medicine

## 2020-08-21 DIAGNOSIS — D508 Other iron deficiency anemias: Secondary | ICD-10-CM | POA: Diagnosis not present

## 2020-08-21 NOTE — Telephone Encounter (Signed)
Call patient please let her know that the Dopplers of her carotid arteries in her neck look okay.  No worrisome narrowing or stenosis that needs any treatment.  Please call patient and let her know that the ultrasound on her carotid arteries look good.  No worrisome or significant narrowing.

## 2020-08-22 DIAGNOSIS — N186 End stage renal disease: Secondary | ICD-10-CM | POA: Diagnosis not present

## 2020-08-22 DIAGNOSIS — D631 Anemia in chronic kidney disease: Secondary | ICD-10-CM | POA: Diagnosis not present

## 2020-08-22 DIAGNOSIS — N2581 Secondary hyperparathyroidism of renal origin: Secondary | ICD-10-CM | POA: Diagnosis not present

## 2020-08-22 DIAGNOSIS — D509 Iron deficiency anemia, unspecified: Secondary | ICD-10-CM | POA: Diagnosis not present

## 2020-08-22 NOTE — Telephone Encounter (Signed)
Pt informed of results.  Pt expressed understanding.  T. Angel Hobdy, CMA  

## 2020-08-24 DIAGNOSIS — D509 Iron deficiency anemia, unspecified: Secondary | ICD-10-CM | POA: Diagnosis not present

## 2020-08-24 DIAGNOSIS — Z7901 Long term (current) use of anticoagulants: Secondary | ICD-10-CM | POA: Diagnosis not present

## 2020-08-24 DIAGNOSIS — N2581 Secondary hyperparathyroidism of renal origin: Secondary | ICD-10-CM | POA: Diagnosis not present

## 2020-08-24 DIAGNOSIS — D631 Anemia in chronic kidney disease: Secondary | ICD-10-CM | POA: Diagnosis not present

## 2020-08-24 DIAGNOSIS — N186 End stage renal disease: Secondary | ICD-10-CM | POA: Diagnosis not present

## 2020-08-27 DIAGNOSIS — D509 Iron deficiency anemia, unspecified: Secondary | ICD-10-CM | POA: Diagnosis not present

## 2020-08-27 DIAGNOSIS — N2581 Secondary hyperparathyroidism of renal origin: Secondary | ICD-10-CM | POA: Diagnosis not present

## 2020-08-27 DIAGNOSIS — N186 End stage renal disease: Secondary | ICD-10-CM | POA: Diagnosis not present

## 2020-08-27 DIAGNOSIS — D631 Anemia in chronic kidney disease: Secondary | ICD-10-CM | POA: Diagnosis not present

## 2020-08-27 DIAGNOSIS — I82409 Acute embolism and thrombosis of unspecified deep veins of unspecified lower extremity: Secondary | ICD-10-CM | POA: Diagnosis not present

## 2020-08-28 DIAGNOSIS — R760 Raised antibody titer: Secondary | ICD-10-CM | POA: Diagnosis not present

## 2020-08-28 DIAGNOSIS — Z86718 Personal history of other venous thrombosis and embolism: Secondary | ICD-10-CM | POA: Diagnosis not present

## 2020-08-28 DIAGNOSIS — D500 Iron deficiency anemia secondary to blood loss (chronic): Secondary | ICD-10-CM | POA: Diagnosis not present

## 2020-08-28 DIAGNOSIS — M329 Systemic lupus erythematosus, unspecified: Secondary | ICD-10-CM | POA: Diagnosis not present

## 2020-08-28 DIAGNOSIS — Z7901 Long term (current) use of anticoagulants: Secondary | ICD-10-CM | POA: Diagnosis not present

## 2020-08-28 DIAGNOSIS — N186 End stage renal disease: Secondary | ICD-10-CM | POA: Diagnosis not present

## 2020-08-28 DIAGNOSIS — D6862 Lupus anticoagulant syndrome: Secondary | ICD-10-CM | POA: Diagnosis not present

## 2020-08-28 DIAGNOSIS — D649 Anemia, unspecified: Secondary | ICD-10-CM | POA: Diagnosis not present

## 2020-08-29 DIAGNOSIS — D509 Iron deficiency anemia, unspecified: Secondary | ICD-10-CM | POA: Diagnosis not present

## 2020-08-29 DIAGNOSIS — N186 End stage renal disease: Secondary | ICD-10-CM | POA: Diagnosis not present

## 2020-08-29 DIAGNOSIS — D631 Anemia in chronic kidney disease: Secondary | ICD-10-CM | POA: Diagnosis not present

## 2020-08-29 DIAGNOSIS — N2581 Secondary hyperparathyroidism of renal origin: Secondary | ICD-10-CM | POA: Diagnosis not present

## 2020-08-31 DIAGNOSIS — D631 Anemia in chronic kidney disease: Secondary | ICD-10-CM | POA: Diagnosis not present

## 2020-08-31 DIAGNOSIS — N186 End stage renal disease: Secondary | ICD-10-CM | POA: Diagnosis not present

## 2020-08-31 DIAGNOSIS — N2581 Secondary hyperparathyroidism of renal origin: Secondary | ICD-10-CM | POA: Diagnosis not present

## 2020-08-31 DIAGNOSIS — D509 Iron deficiency anemia, unspecified: Secondary | ICD-10-CM | POA: Diagnosis not present

## 2020-09-03 DIAGNOSIS — N2581 Secondary hyperparathyroidism of renal origin: Secondary | ICD-10-CM | POA: Diagnosis not present

## 2020-09-03 DIAGNOSIS — I82409 Acute embolism and thrombosis of unspecified deep veins of unspecified lower extremity: Secondary | ICD-10-CM | POA: Diagnosis not present

## 2020-09-03 DIAGNOSIS — N186 End stage renal disease: Secondary | ICD-10-CM | POA: Diagnosis not present

## 2020-09-03 DIAGNOSIS — D631 Anemia in chronic kidney disease: Secondary | ICD-10-CM | POA: Diagnosis not present

## 2020-09-03 DIAGNOSIS — D509 Iron deficiency anemia, unspecified: Secondary | ICD-10-CM | POA: Diagnosis not present

## 2020-09-05 DIAGNOSIS — Z992 Dependence on renal dialysis: Secondary | ICD-10-CM | POA: Diagnosis not present

## 2020-09-05 DIAGNOSIS — N2581 Secondary hyperparathyroidism of renal origin: Secondary | ICD-10-CM | POA: Diagnosis not present

## 2020-09-05 DIAGNOSIS — D631 Anemia in chronic kidney disease: Secondary | ICD-10-CM | POA: Diagnosis not present

## 2020-09-05 DIAGNOSIS — D509 Iron deficiency anemia, unspecified: Secondary | ICD-10-CM | POA: Diagnosis not present

## 2020-09-05 DIAGNOSIS — N186 End stage renal disease: Secondary | ICD-10-CM | POA: Diagnosis not present

## 2020-09-07 DIAGNOSIS — D509 Iron deficiency anemia, unspecified: Secondary | ICD-10-CM | POA: Diagnosis not present

## 2020-09-07 DIAGNOSIS — N2581 Secondary hyperparathyroidism of renal origin: Secondary | ICD-10-CM | POA: Diagnosis not present

## 2020-09-07 DIAGNOSIS — N186 End stage renal disease: Secondary | ICD-10-CM | POA: Diagnosis not present

## 2020-09-07 DIAGNOSIS — D631 Anemia in chronic kidney disease: Secondary | ICD-10-CM | POA: Diagnosis not present

## 2020-09-10 DIAGNOSIS — Z7901 Long term (current) use of anticoagulants: Secondary | ICD-10-CM | POA: Diagnosis not present

## 2020-09-10 DIAGNOSIS — N2581 Secondary hyperparathyroidism of renal origin: Secondary | ICD-10-CM | POA: Diagnosis not present

## 2020-09-10 DIAGNOSIS — D631 Anemia in chronic kidney disease: Secondary | ICD-10-CM | POA: Diagnosis not present

## 2020-09-10 DIAGNOSIS — R059 Cough, unspecified: Secondary | ICD-10-CM | POA: Diagnosis not present

## 2020-09-10 DIAGNOSIS — I82409 Acute embolism and thrombosis of unspecified deep veins of unspecified lower extremity: Secondary | ICD-10-CM | POA: Diagnosis not present

## 2020-09-10 DIAGNOSIS — D689 Coagulation defect, unspecified: Secondary | ICD-10-CM | POA: Diagnosis not present

## 2020-09-10 DIAGNOSIS — D509 Iron deficiency anemia, unspecified: Secondary | ICD-10-CM | POA: Diagnosis not present

## 2020-09-10 DIAGNOSIS — I120 Hypertensive chronic kidney disease with stage 5 chronic kidney disease or end stage renal disease: Secondary | ICD-10-CM | POA: Diagnosis not present

## 2020-09-10 DIAGNOSIS — Z888 Allergy status to other drugs, medicaments and biological substances status: Secondary | ICD-10-CM | POA: Diagnosis not present

## 2020-09-10 DIAGNOSIS — Z79899 Other long term (current) drug therapy: Secondary | ICD-10-CM | POA: Diagnosis not present

## 2020-09-10 DIAGNOSIS — N186 End stage renal disease: Secondary | ICD-10-CM | POA: Diagnosis not present

## 2020-09-10 DIAGNOSIS — Z86718 Personal history of other venous thrombosis and embolism: Secondary | ICD-10-CM | POA: Diagnosis not present

## 2020-09-10 DIAGNOSIS — Z20822 Contact with and (suspected) exposure to covid-19: Secondary | ICD-10-CM | POA: Diagnosis not present

## 2020-09-10 DIAGNOSIS — J018 Other acute sinusitis: Secondary | ICD-10-CM | POA: Diagnosis not present

## 2020-09-10 DIAGNOSIS — B9689 Other specified bacterial agents as the cause of diseases classified elsewhere: Secondary | ICD-10-CM | POA: Diagnosis not present

## 2020-09-10 DIAGNOSIS — J329 Chronic sinusitis, unspecified: Secondary | ICD-10-CM | POA: Diagnosis not present

## 2020-09-12 ENCOUNTER — Other Ambulatory Visit: Payer: Self-pay

## 2020-09-12 DIAGNOSIS — D509 Iron deficiency anemia, unspecified: Secondary | ICD-10-CM | POA: Diagnosis not present

## 2020-09-12 DIAGNOSIS — E119 Type 2 diabetes mellitus without complications: Secondary | ICD-10-CM | POA: Diagnosis not present

## 2020-09-12 DIAGNOSIS — D631 Anemia in chronic kidney disease: Secondary | ICD-10-CM | POA: Diagnosis not present

## 2020-09-12 DIAGNOSIS — N2581 Secondary hyperparathyroidism of renal origin: Secondary | ICD-10-CM | POA: Diagnosis not present

## 2020-09-12 DIAGNOSIS — N186 End stage renal disease: Secondary | ICD-10-CM | POA: Diagnosis not present

## 2020-09-12 NOTE — Patient Outreach (Signed)
[facility] Care Management  09/12/2020  LANDRI DORSAINVIL 03-06-1950 [id number]   Telephone Assessment    Unsuccessful quarterly outreach attempt to patient.      Plan: RN CM will make outreach attempt to patient within the month of Dec.   Anthonymichael Munday [facility] [place] Management Telephonic Care Management Coordinator Direct Phone: [phone] Toll Free: [phone] Fax: [phone]

## 2020-09-13 IMAGING — MG DIGITAL SCREENING BILAT W/ TOMO W/ CAD
8 series · 8 of 24 positions shown · non-contrast
Comparison: Previous exam(s).

CLINICAL DATA: Screening.

EXAM:
DIGITAL SCREENING BILATERAL MAMMOGRAM WITH TOMO AND CAD

[L MLO synth-2D]
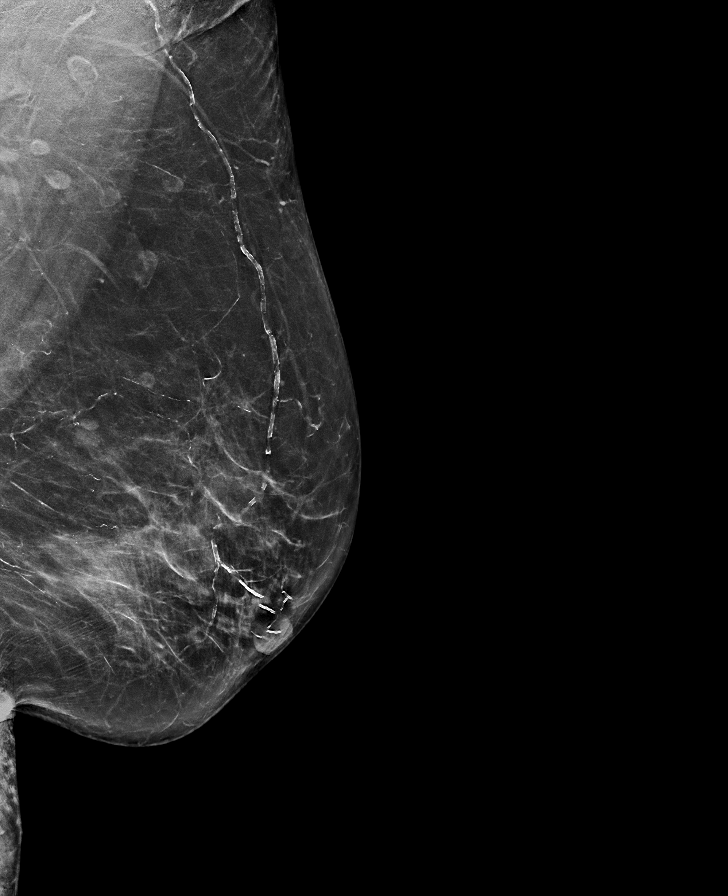

[R CC synth-2D]
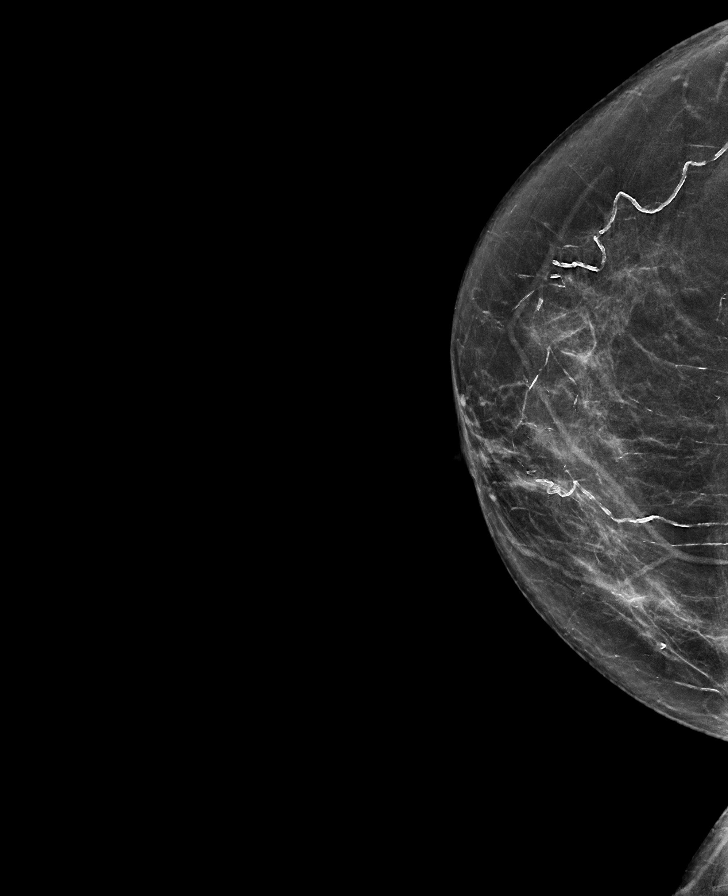

[L CC synth-2D]
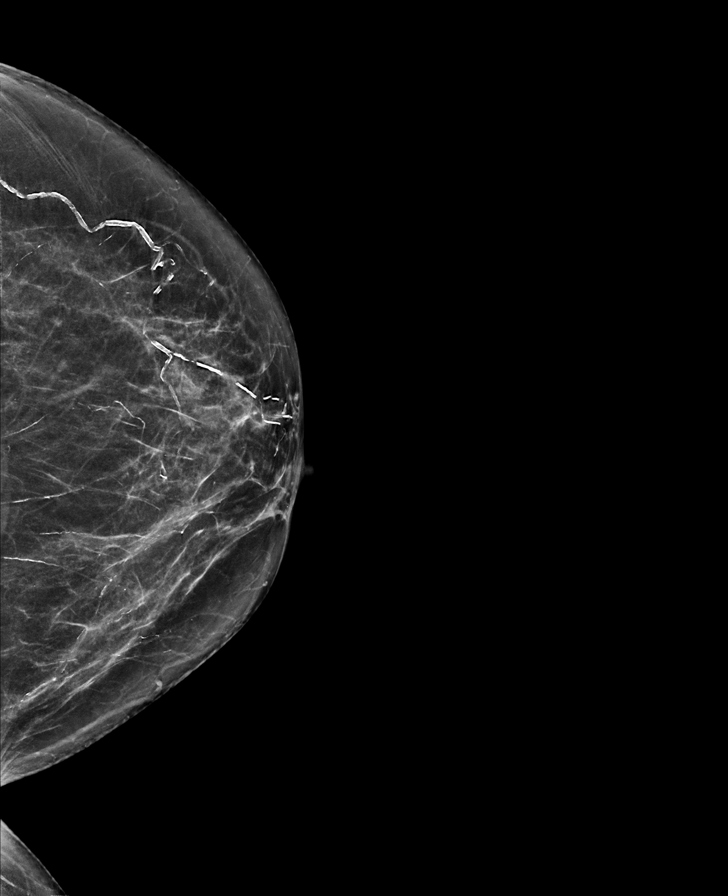

[R MLO synth-2D]
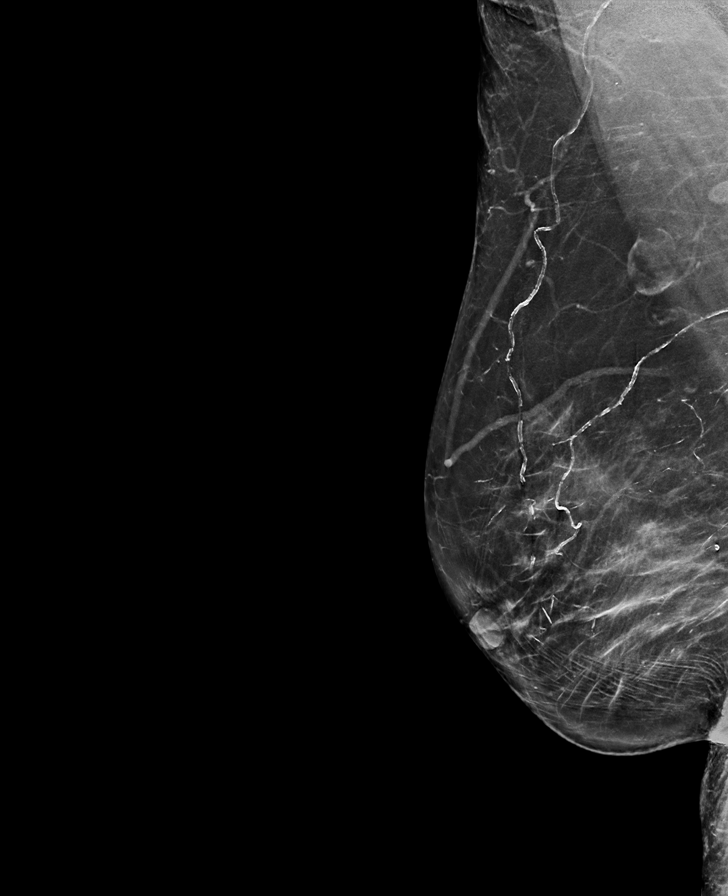

[L MLO tomo · tomo slice 39/77.0]
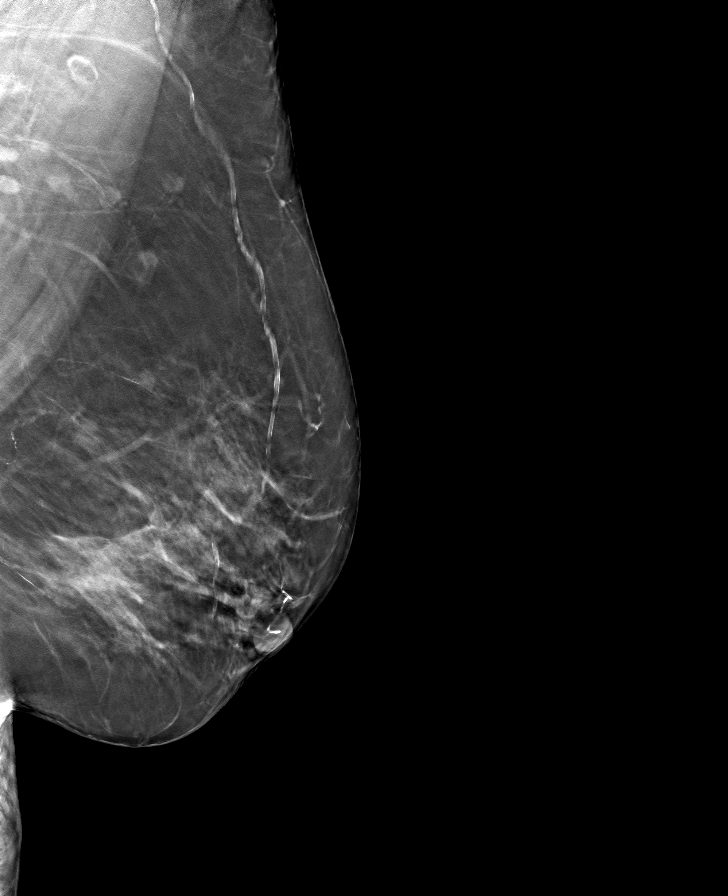

[R CC tomo · tomo slice 33/66.0]
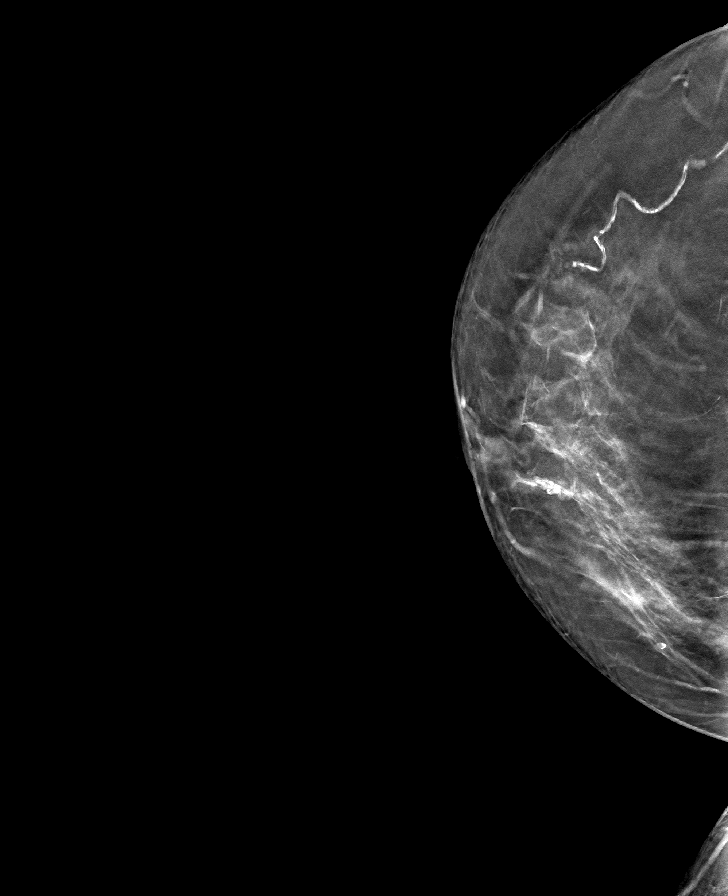

[L CC tomo · tomo slice 32/63.0]
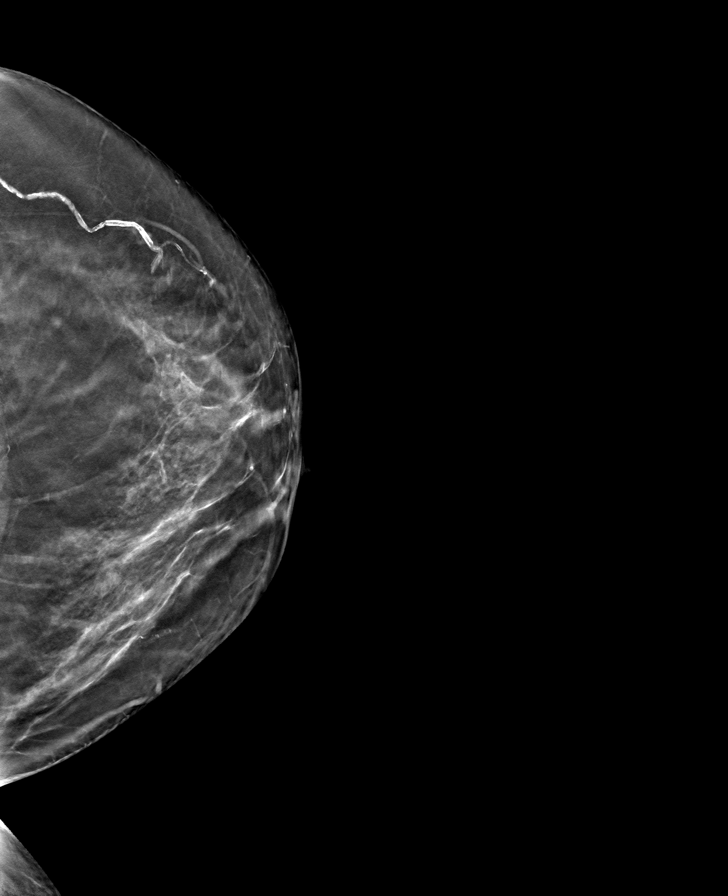

[R MLO tomo · tomo slice 37/72.0]
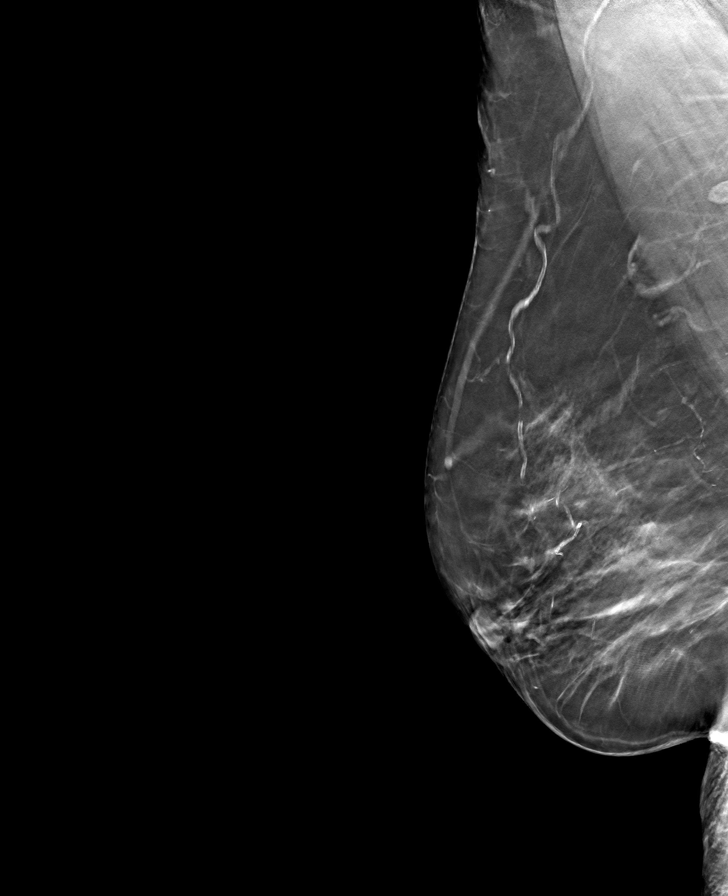

[8 of 24 positions shown; findings below may reference images not displayed]

ACR Breast Density Category b: There are scattered areas of
fibroglandular density.
FINDINGS: There are no findings suspicious for malignancy. Images were
processed with CAD.
IMPRESSION: No mammographic evidence of malignancy. A result letter of this
screening mammogram will be mailed directly to the patient.

RECOMMENDATION:
Screening mammogram in one year. (Code:CN-U-775)

BI-RADS CATEGORY  1: Negative.

## 2020-09-14 DIAGNOSIS — D509 Iron deficiency anemia, unspecified: Secondary | ICD-10-CM | POA: Diagnosis not present

## 2020-09-14 DIAGNOSIS — N186 End stage renal disease: Secondary | ICD-10-CM | POA: Diagnosis not present

## 2020-09-14 DIAGNOSIS — D631 Anemia in chronic kidney disease: Secondary | ICD-10-CM | POA: Diagnosis not present

## 2020-09-14 DIAGNOSIS — N2581 Secondary hyperparathyroidism of renal origin: Secondary | ICD-10-CM | POA: Diagnosis not present

## 2020-09-17 DIAGNOSIS — I82409 Acute embolism and thrombosis of unspecified deep veins of unspecified lower extremity: Secondary | ICD-10-CM | POA: Diagnosis not present

## 2020-09-17 DIAGNOSIS — D509 Iron deficiency anemia, unspecified: Secondary | ICD-10-CM | POA: Diagnosis not present

## 2020-09-17 DIAGNOSIS — D631 Anemia in chronic kidney disease: Secondary | ICD-10-CM | POA: Diagnosis not present

## 2020-09-17 DIAGNOSIS — N186 End stage renal disease: Secondary | ICD-10-CM | POA: Diagnosis not present

## 2020-09-17 DIAGNOSIS — N2581 Secondary hyperparathyroidism of renal origin: Secondary | ICD-10-CM | POA: Diagnosis not present

## 2020-09-19 DIAGNOSIS — D509 Iron deficiency anemia, unspecified: Secondary | ICD-10-CM | POA: Diagnosis not present

## 2020-09-19 DIAGNOSIS — N2581 Secondary hyperparathyroidism of renal origin: Secondary | ICD-10-CM | POA: Diagnosis not present

## 2020-09-19 DIAGNOSIS — N186 End stage renal disease: Secondary | ICD-10-CM | POA: Diagnosis not present

## 2020-09-19 DIAGNOSIS — D631 Anemia in chronic kidney disease: Secondary | ICD-10-CM | POA: Diagnosis not present

## 2020-09-21 DIAGNOSIS — D631 Anemia in chronic kidney disease: Secondary | ICD-10-CM | POA: Diagnosis not present

## 2020-09-21 DIAGNOSIS — D509 Iron deficiency anemia, unspecified: Secondary | ICD-10-CM | POA: Diagnosis not present

## 2020-09-21 DIAGNOSIS — N2581 Secondary hyperparathyroidism of renal origin: Secondary | ICD-10-CM | POA: Diagnosis not present

## 2020-09-21 DIAGNOSIS — N186 End stage renal disease: Secondary | ICD-10-CM | POA: Diagnosis not present

## 2020-09-24 DIAGNOSIS — D509 Iron deficiency anemia, unspecified: Secondary | ICD-10-CM | POA: Diagnosis not present

## 2020-09-24 DIAGNOSIS — N2581 Secondary hyperparathyroidism of renal origin: Secondary | ICD-10-CM | POA: Diagnosis not present

## 2020-09-24 DIAGNOSIS — D631 Anemia in chronic kidney disease: Secondary | ICD-10-CM | POA: Diagnosis not present

## 2020-09-24 DIAGNOSIS — I82409 Acute embolism and thrombosis of unspecified deep veins of unspecified lower extremity: Secondary | ICD-10-CM | POA: Diagnosis not present

## 2020-09-24 DIAGNOSIS — N186 End stage renal disease: Secondary | ICD-10-CM | POA: Diagnosis not present

## 2020-09-26 DIAGNOSIS — N2581 Secondary hyperparathyroidism of renal origin: Secondary | ICD-10-CM | POA: Diagnosis not present

## 2020-09-26 DIAGNOSIS — N186 End stage renal disease: Secondary | ICD-10-CM | POA: Diagnosis not present

## 2020-09-26 DIAGNOSIS — D509 Iron deficiency anemia, unspecified: Secondary | ICD-10-CM | POA: Diagnosis not present

## 2020-09-26 DIAGNOSIS — D631 Anemia in chronic kidney disease: Secondary | ICD-10-CM | POA: Diagnosis not present

## 2020-09-28 ENCOUNTER — Ambulatory Visit (INDEPENDENT_AMBULATORY_CARE_PROVIDER_SITE_OTHER): Payer: Medicare HMO | Admitting: Family Medicine

## 2020-09-28 ENCOUNTER — Encounter: Payer: Self-pay | Admitting: Family Medicine

## 2020-09-28 VITALS — BP 154/60 | HR 79 | Ht 62.0 in | Wt 140.0 lb

## 2020-09-28 DIAGNOSIS — K649 Unspecified hemorrhoids: Secondary | ICD-10-CM | POA: Diagnosis not present

## 2020-09-28 DIAGNOSIS — D6861 Antiphospholipid syndrome: Secondary | ICD-10-CM

## 2020-09-28 DIAGNOSIS — R0781 Pleurodynia: Secondary | ICD-10-CM

## 2020-09-28 DIAGNOSIS — M329 Systemic lupus erythematosus, unspecified: Secondary | ICD-10-CM

## 2020-09-28 DIAGNOSIS — D61818 Other pancytopenia: Secondary | ICD-10-CM

## 2020-09-28 DIAGNOSIS — N2581 Secondary hyperparathyroidism of renal origin: Secondary | ICD-10-CM | POA: Diagnosis not present

## 2020-09-28 DIAGNOSIS — R059 Cough, unspecified: Secondary | ICD-10-CM | POA: Diagnosis not present

## 2020-09-28 DIAGNOSIS — N186 End stage renal disease: Secondary | ICD-10-CM | POA: Diagnosis not present

## 2020-09-28 DIAGNOSIS — D631 Anemia in chronic kidney disease: Secondary | ICD-10-CM | POA: Diagnosis not present

## 2020-09-28 DIAGNOSIS — D509 Iron deficiency anemia, unspecified: Secondary | ICD-10-CM | POA: Diagnosis not present

## 2020-09-28 MED ORDER — LANSOPRAZOLE 30 MG PO CPDR
30.0000 mg | DELAYED_RELEASE_CAPSULE | Freq: Every morning | ORAL | 1 refills | Status: DC
Start: 2020-09-28 — End: 2022-06-10

## 2020-09-28 NOTE — Progress Notes (Signed)
She reports that she has been to the ED 2x for cough. She stated that she was given antibiotics and cough medication which helped her for about 3-4 days but the cough returned. She went back and was given more cough medication. She said that her nose gets runny once she starts coughing. She feels like there is a lot of mucus sitting in her chest although she does cough some up it still feels as if it's just sitting there. She has stopped using the nasal spray.

## 2020-09-28 NOTE — Patient Instructions (Signed)
Okay to use the hydrocortisone 2.5% cream sent in in July on the rectal area.  If we need to send a new prescription just let me know.

## 2020-09-28 NOTE — Progress Notes (Addendum)
Established Patient Office Visit  Subjective:  Patient ID: Joy Anderson, female    DOB: 09/22/1950  Age: 70 y.o. MRN: [id number]  CC:  Chief Complaint  Patient presents with   Follow-up    HPI Joy Anderson presents for  cough, hx of ESRD.  She reports that she has been to the ED 2x for cough. She stated that she was given antibiotics and cough medication which helped her for about 3-4 days but the cough returned. She went back and was given more cough medication. She said that her nose gets runny once she starts coughing. She feels like there is a lot of mucus sitting in her chest although she does cough some up it still feels as if it's just sitting there. She has stopped using the nasal spray. She does feel much better than she did a  few weeks ago, 80% better, just can't get rid of mucous and residual cough.  She has been trying some Mucinex.  Unfortunately her hemorrhoids have been flaring with all the coughing as well she wants to know if there is anything prescription that we could send to the pharmacy.  She also complains of right sided chest pain in the axillary line but over the lower rib area she says it is just felt sore.  Its been bothering her for maybe a few months.  Past Medical History:  Diagnosis Date   Antiphospholipid antibody with hypercoagulable state ([place]) 08/27/2010   DVT (deep venous thrombosis) ([facility])    anti phospholipid antibody + -- Dr Lewanda Rife   Hypertension    OAB (overactive bladder)    off meds   Post-menopausal    Proteinuria    Dr Tressie Ellis   Raynaud's disease     Past Surgical History:  Procedure Laterality Date   AV FISTULA PLACEMENT  01/06/2014   CATARACT EXTRACTION, BILATERAL  01/07/2015   HIP SURGERY Right 10/2018   left one done 10/2019   TOTAL ABDOMINAL HYSTERECTOMY  01/07/1999   for fibroids w/ 1 oophorectomy    Family History  Problem Relation Age of Onset   Alcohol abuse Father    Colon cancer Brother    Prostate cancer  Brother    Cerebral palsy Brother    Diabetes Other     Social History   Socioeconomic History   Marital status: Divorced    Spouse name: Not on file   Number of children: Not on file   Years of education: Not on file   Highest education level: Not on file  Occupational History   Not on file  Tobacco Use   Smoking status: Never   Smokeless tobacco: Never  Substance and Sexual Activity   Alcohol use: Yes    Comment: occasional   Drug use: No   Sexual activity: Not on file  Other Topics Concern   Not on file  Social History Narrative   Not on file   Social Determinants of Health   Financial Resource Strain: Not on file  Food Insecurity: Not on file  Transportation Needs: Not on file  Physical Activity: Not on file  Stress: Not on file  Social Connections: Not on file  Intimate Partner Violence: Not on file    Outpatient Medications Prior to Visit  Medication Sig Dispense Refill   acetaminophen (TYLENOL) 500 MG tablet Take 500 mg by mouth every 6 (Anderson) hours as needed. Two tablets     AMBULATORY NON FORMULARY MEDICATION Medication Name: Shower chair 1 each  0   amLODipine (NORVASC) 2.5 MG tablet Take 1 tablet by mouth in the evening 90 tablet 0   atorvastatin (LIPITOR) 20 MG tablet Take 1 tablet (20 mg total) by mouth at bedtime. Due for follow up visit 90 tablet 3   CALCIUM PO Take by mouth.     cinacalcet (SENSIPAR) 30 MG tablet Take 2 tablets by mouth daily.     hydrocortisone 2.5 % cream Apply 1 application topically 2 (two) times daily as needed. 45 g 1   hydroxychloroquine (PLAQUENIL) 200 MG tablet Take 200 mg by mouth 2 (two) times daily.     multivitamin (RENA-VIT) TABS tablet      warfarin (COUMADIN) 1 MG tablet      warfarin (COUMADIN) 4 MG tablet Take by mouth.     warfarin (COUMADIN) 5 MG tablet TAKE 1 TABLET BY MOUTH ON SUNDAY, WENESDAY, THURSDAY, FRIDAY, AND SATURDAY. THEN TAKE 1 AND 1/2 TABLET ON MONDAY AND TUESDAY (Patient taking differently: Patient  states taking 2.5mg (half tablet) daily) 97 tablet 1   ipratropium (ATROVENT) 0.03 % nasal spray Place 2 sprays into both nostrils 2 (two) times daily as needed for rhinitis. For runny nose 30 mL 2   No facility-administered medications prior to visit.    Allergies  Allergen Reactions   Ace Inhibitors     REACTION: cough Other reaction(s): Other (See Comments) REACTION: cough   Metformin     Other reaction(s): Other (See Comments) Nausea, decreased appetite Nausea, decreased appetite   Fish Oil Other (See Comments)    Other reaction(s): Other (See Comments)   Metformin And Related Other (See Comments)    Nausea, decreased appetite    ROS Review of Systems    Objective:    Physical Exam  BP (!) 154/60   Pulse 79   Ht 5\' 2"  (1.575 m)   Wt 140 lb (63.5 kg)   LMP 03/18/1999   SpO2 99%   BMI 25.61 kg/m  Wt Readings from Last 3 Encounters:  09/28/20 140 lb (63.5 kg)  07/24/20 143 lb (64.9 kg)  05/01/20 147 lb (66.7 kg)     Health Maintenance Due  Topic Date Due   Zoster Vaccines- Shingrix (1 of 2) Never done   DEXA SCAN  09/18/2017   COVID-19 Vaccine (4 - Booster for Moderna series) 01/07/2020   INFLUENZA VACCINE  08/06/2020    There are no preventive care reminders to display for this patient.  Lab Results  Component Value Date   TSH 2.357 04/25/2014   Lab Results  Component Value Date   WBC 7.5 09/09/2017   HGB 10.0 (A) 09/09/2017   HCT 30 (A) 09/09/2017   MCV 83.0 04/25/2014   PLT 230 09/09/2017   Lab Results  Component Value Date   NA 142 09/02/2018   K 4.2 09/02/2018   CO2 30 09/02/2018   GLUCOSE 90 09/02/2018   BUN 18 09/02/2018   CREATININE 7.79 (H) 09/02/2018   BILITOT 0.4 09/02/2018   ALKPHOS 78 09/09/2017   AST 14 09/02/2018   ALT 15 09/02/2018   PROT 7.5 09/02/2018   ALBUMIN 4.5 08/06/2016   ALBUMIN 4.5 08/06/2016   CALCIUM 9.7 09/02/2018   Lab Results  Component Value Date   CHOL 162 02/28/2020   Lab Results  Component  Value Date   HDL 41 (L) 02/28/2020   Lab Results  Component Value Date   LDLCALC 90 02/28/2020   Lab Results  Component Value Date   TRIG 213 (H) 02/28/2020  Lab Results  Component Value Date   CHOLHDL 4.0 02/28/2020   Lab Results  Component Value Date   HGBA1C 5.2 02/28/2020      Assessment & Plan:   Problem List Items Addressed This Visit       Cardiovascular and Mediastinum   Hemorrhoids    Ok to use the 2.5% hydrocortisone cream given in July. If doesn't have tube at hoe call back and we will send to pharmacy.          Musculoskeletal and Integument   SLE (systemic lupus erythematosus related syndrome) ([place])    Follows with Dr. Franki Monte.  On Plaquenil and coumadin.          Hematopoietic and Hemostatic   Antiphospholipid antibody with hypercoagulable state ([place])    Hx of DVT.  On coumadin. Managed by [facility]      Other Visit Diagnoses     Cough    -  Primary   Relevant Medications   lansoprazole (PREVACID) 30 MG capsule   Rib pain on right side       Relevant Orders   DG Ribs Unilateral Right   Other pancytopenia ([facility])   (Chronic)         Post infectious cough  - CXR in Ed ws normal. Chest exam is nromal.  Recommend continue nasal allergy spray and start pevacid for reflux.  If not better in 10 days then consider further workup.  She does feel like the mucous if more on the right side of her chest.  Consider repeat xray if not improving.    Right side pain over her ribs - no known injury but she has been coughing for weeks.  Will get x-ray. She says she want to do it another day.    Meds ordered this encounter  Medications   lansoprazole (PREVACID) 30 MG capsule    Sig: Take 1 capsule (30 mg total) by mouth in the morning. About 20 min before first meal of day    Dispense:  30 capsule    Refill:  1   Orders Placed This Encounter  Procedures   DG Ribs Unilateral Right    Standing Status:   Future    Standing Expiration Date:   09/29/2021     Order Specific Question:   Reason for Exam (SYMPTOM  OR DIAGNOSIS REQUIRED)    Answer:   pain over rib on the right side below axillary line on lower end.  has been weeks of tenderness. no known trauma. she is worried about lytic lesions.    Order Specific Question:   Preferred imaging location?    Answer:   [facility]     Follow-up: No follow-ups on file.    [name], MD

## 2020-09-29 DIAGNOSIS — K649 Unspecified hemorrhoids: Secondary | ICD-10-CM | POA: Insufficient documentation

## 2020-09-29 NOTE — Assessment & Plan Note (Signed)
Follows with Dr. Franki Monte.  On Plaquenil and coumadin.

## 2020-09-29 NOTE — Assessment & Plan Note (Signed)
Ok to use the 2.5% hydrocortisone cream given in July. If doesn't have tube at hoe call back and we will send to pharmacy.

## 2020-09-29 NOTE — Assessment & Plan Note (Addendum)
Hx of DVT.  On coumadin. Managed by [facility]

## 2020-10-01 DIAGNOSIS — N186 End stage renal disease: Secondary | ICD-10-CM | POA: Diagnosis not present

## 2020-10-01 DIAGNOSIS — I82409 Acute embolism and thrombosis of unspecified deep veins of unspecified lower extremity: Secondary | ICD-10-CM | POA: Diagnosis not present

## 2020-10-01 DIAGNOSIS — D509 Iron deficiency anemia, unspecified: Secondary | ICD-10-CM | POA: Diagnosis not present

## 2020-10-01 DIAGNOSIS — N2581 Secondary hyperparathyroidism of renal origin: Secondary | ICD-10-CM | POA: Diagnosis not present

## 2020-10-01 DIAGNOSIS — D631 Anemia in chronic kidney disease: Secondary | ICD-10-CM | POA: Diagnosis not present

## 2020-10-03 DIAGNOSIS — N186 End stage renal disease: Secondary | ICD-10-CM | POA: Diagnosis not present

## 2020-10-03 DIAGNOSIS — N2581 Secondary hyperparathyroidism of renal origin: Secondary | ICD-10-CM | POA: Diagnosis not present

## 2020-10-03 DIAGNOSIS — D631 Anemia in chronic kidney disease: Secondary | ICD-10-CM | POA: Diagnosis not present

## 2020-10-03 DIAGNOSIS — D509 Iron deficiency anemia, unspecified: Secondary | ICD-10-CM | POA: Diagnosis not present

## 2020-10-04 ENCOUNTER — Other Ambulatory Visit: Payer: Self-pay | Admitting: Family Medicine

## 2020-10-04 DIAGNOSIS — Z1231 Encounter for screening mammogram for malignant neoplasm of breast: Secondary | ICD-10-CM

## 2020-10-05 DIAGNOSIS — D631 Anemia in chronic kidney disease: Secondary | ICD-10-CM | POA: Diagnosis not present

## 2020-10-05 DIAGNOSIS — Z992 Dependence on renal dialysis: Secondary | ICD-10-CM | POA: Diagnosis not present

## 2020-10-05 DIAGNOSIS — D509 Iron deficiency anemia, unspecified: Secondary | ICD-10-CM | POA: Diagnosis not present

## 2020-10-05 DIAGNOSIS — N186 End stage renal disease: Secondary | ICD-10-CM | POA: Diagnosis not present

## 2020-10-05 DIAGNOSIS — N2581 Secondary hyperparathyroidism of renal origin: Secondary | ICD-10-CM | POA: Diagnosis not present

## 2020-10-06 DIAGNOSIS — D509 Iron deficiency anemia, unspecified: Secondary | ICD-10-CM | POA: Diagnosis not present

## 2020-10-06 DIAGNOSIS — N2581 Secondary hyperparathyroidism of renal origin: Secondary | ICD-10-CM | POA: Diagnosis not present

## 2020-10-06 DIAGNOSIS — N186 End stage renal disease: Secondary | ICD-10-CM | POA: Diagnosis not present

## 2020-10-06 DIAGNOSIS — D631 Anemia in chronic kidney disease: Secondary | ICD-10-CM | POA: Diagnosis not present

## 2020-10-06 DIAGNOSIS — Z23 Encounter for immunization: Secondary | ICD-10-CM | POA: Diagnosis not present

## 2020-10-08 ENCOUNTER — Other Ambulatory Visit: Payer: Self-pay | Admitting: Family Medicine

## 2020-10-08 DIAGNOSIS — Z23 Encounter for immunization: Secondary | ICD-10-CM | POA: Diagnosis not present

## 2020-10-08 DIAGNOSIS — I82409 Acute embolism and thrombosis of unspecified deep veins of unspecified lower extremity: Secondary | ICD-10-CM | POA: Diagnosis not present

## 2020-10-08 DIAGNOSIS — D509 Iron deficiency anemia, unspecified: Secondary | ICD-10-CM | POA: Diagnosis not present

## 2020-10-08 DIAGNOSIS — I10 Essential (primary) hypertension: Secondary | ICD-10-CM

## 2020-10-08 DIAGNOSIS — D631 Anemia in chronic kidney disease: Secondary | ICD-10-CM | POA: Diagnosis not present

## 2020-10-08 DIAGNOSIS — N186 End stage renal disease: Secondary | ICD-10-CM | POA: Diagnosis not present

## 2020-10-08 DIAGNOSIS — N2581 Secondary hyperparathyroidism of renal origin: Secondary | ICD-10-CM | POA: Diagnosis not present

## 2020-10-10 DIAGNOSIS — D631 Anemia in chronic kidney disease: Secondary | ICD-10-CM | POA: Diagnosis not present

## 2020-10-10 DIAGNOSIS — D509 Iron deficiency anemia, unspecified: Secondary | ICD-10-CM | POA: Diagnosis not present

## 2020-10-10 DIAGNOSIS — E119 Type 2 diabetes mellitus without complications: Secondary | ICD-10-CM | POA: Diagnosis not present

## 2020-10-10 DIAGNOSIS — N2581 Secondary hyperparathyroidism of renal origin: Secondary | ICD-10-CM | POA: Diagnosis not present

## 2020-10-10 DIAGNOSIS — Z23 Encounter for immunization: Secondary | ICD-10-CM | POA: Diagnosis not present

## 2020-10-10 DIAGNOSIS — N186 End stage renal disease: Secondary | ICD-10-CM | POA: Diagnosis not present

## 2020-10-11 ENCOUNTER — Other Ambulatory Visit: Payer: Self-pay | Admitting: Family Medicine

## 2020-10-11 ENCOUNTER — Other Ambulatory Visit: Payer: Self-pay

## 2020-10-11 ENCOUNTER — Ambulatory Visit (INDEPENDENT_AMBULATORY_CARE_PROVIDER_SITE_OTHER): Payer: Medicare HMO

## 2020-10-11 DIAGNOSIS — R0781 Pleurodynia: Secondary | ICD-10-CM

## 2020-10-11 DIAGNOSIS — Z1231 Encounter for screening mammogram for malignant neoplasm of breast: Secondary | ICD-10-CM

## 2020-10-11 DIAGNOSIS — Z043 Encounter for examination and observation following other accident: Secondary | ICD-10-CM | POA: Diagnosis not present

## 2020-10-12 DIAGNOSIS — D509 Iron deficiency anemia, unspecified: Secondary | ICD-10-CM | POA: Diagnosis not present

## 2020-10-12 DIAGNOSIS — Z23 Encounter for immunization: Secondary | ICD-10-CM | POA: Diagnosis not present

## 2020-10-12 DIAGNOSIS — D631 Anemia in chronic kidney disease: Secondary | ICD-10-CM | POA: Diagnosis not present

## 2020-10-12 DIAGNOSIS — N186 End stage renal disease: Secondary | ICD-10-CM | POA: Diagnosis not present

## 2020-10-12 DIAGNOSIS — N2581 Secondary hyperparathyroidism of renal origin: Secondary | ICD-10-CM | POA: Diagnosis not present

## 2020-10-12 NOTE — Progress Notes (Signed)
Call patient: Joy Anderson news chest x-ray looks okay they do not see any fracture or lesions inside of the bones which is great.  No sign of excess fluid in the lung.  And the lungs look clear themselves no sign of infection.

## 2020-10-15 DIAGNOSIS — Z23 Encounter for immunization: Secondary | ICD-10-CM | POA: Diagnosis not present

## 2020-10-15 DIAGNOSIS — D509 Iron deficiency anemia, unspecified: Secondary | ICD-10-CM | POA: Diagnosis not present

## 2020-10-15 DIAGNOSIS — D631 Anemia in chronic kidney disease: Secondary | ICD-10-CM | POA: Diagnosis not present

## 2020-10-15 DIAGNOSIS — N186 End stage renal disease: Secondary | ICD-10-CM | POA: Diagnosis not present

## 2020-10-15 DIAGNOSIS — N2581 Secondary hyperparathyroidism of renal origin: Secondary | ICD-10-CM | POA: Diagnosis not present

## 2020-10-15 DIAGNOSIS — I82409 Acute embolism and thrombosis of unspecified deep veins of unspecified lower extremity: Secondary | ICD-10-CM | POA: Diagnosis not present

## 2020-10-15 NOTE — Progress Notes (Signed)
Please call patient. Normal mammogram.  Repeat in 1 year.  

## 2020-10-16 DIAGNOSIS — Z7901 Long term (current) use of anticoagulants: Secondary | ICD-10-CM | POA: Diagnosis not present

## 2020-10-16 DIAGNOSIS — Z96642 Presence of left artificial hip joint: Secondary | ICD-10-CM | POA: Diagnosis not present

## 2020-10-16 DIAGNOSIS — N186 End stage renal disease: Secondary | ICD-10-CM | POA: Diagnosis not present

## 2020-10-16 DIAGNOSIS — R1310 Dysphagia, unspecified: Secondary | ICD-10-CM | POA: Diagnosis not present

## 2020-10-16 DIAGNOSIS — Z471 Aftercare following joint replacement surgery: Secondary | ICD-10-CM | POA: Diagnosis not present

## 2020-10-16 DIAGNOSIS — R6889 Other general symptoms and signs: Secondary | ICD-10-CM | POA: Diagnosis not present

## 2020-10-16 DIAGNOSIS — Z96643 Presence of artificial hip joint, bilateral: Secondary | ICD-10-CM | POA: Diagnosis not present

## 2020-10-16 DIAGNOSIS — D500 Iron deficiency anemia secondary to blood loss (chronic): Secondary | ICD-10-CM | POA: Diagnosis not present

## 2020-10-17 DIAGNOSIS — D509 Iron deficiency anemia, unspecified: Secondary | ICD-10-CM | POA: Diagnosis not present

## 2020-10-17 DIAGNOSIS — N186 End stage renal disease: Secondary | ICD-10-CM | POA: Diagnosis not present

## 2020-10-17 DIAGNOSIS — D631 Anemia in chronic kidney disease: Secondary | ICD-10-CM | POA: Diagnosis not present

## 2020-10-17 DIAGNOSIS — N2581 Secondary hyperparathyroidism of renal origin: Secondary | ICD-10-CM | POA: Diagnosis not present

## 2020-10-17 DIAGNOSIS — Z23 Encounter for immunization: Secondary | ICD-10-CM | POA: Diagnosis not present

## 2020-10-19 ENCOUNTER — Ambulatory Visit (INDEPENDENT_AMBULATORY_CARE_PROVIDER_SITE_OTHER): Payer: Medicare HMO | Admitting: Family Medicine

## 2020-10-19 DIAGNOSIS — D631 Anemia in chronic kidney disease: Secondary | ICD-10-CM | POA: Diagnosis not present

## 2020-10-19 DIAGNOSIS — Z0000 Encounter for general adult medical examination without abnormal findings: Secondary | ICD-10-CM

## 2020-10-19 DIAGNOSIS — N186 End stage renal disease: Secondary | ICD-10-CM | POA: Diagnosis not present

## 2020-10-19 DIAGNOSIS — Z780 Asymptomatic menopausal state: Secondary | ICD-10-CM | POA: Diagnosis not present

## 2020-10-19 DIAGNOSIS — Z23 Encounter for immunization: Secondary | ICD-10-CM | POA: Diagnosis not present

## 2020-10-19 DIAGNOSIS — N2581 Secondary hyperparathyroidism of renal origin: Secondary | ICD-10-CM | POA: Diagnosis not present

## 2020-10-19 DIAGNOSIS — D509 Iron deficiency anemia, unspecified: Secondary | ICD-10-CM | POA: Diagnosis not present

## 2020-10-19 NOTE — Patient Instructions (Addendum)
[place] Maintenance Summary and Written Plan of Care  [name] ,  Thank you for allowing me to perform your Medicare Annual Wellness Visit and for your ongoing commitment to your health.   Health Maintenance & Immunization History Health Maintenance  Topic Date Due   COVID-19 Vaccine (4 - Booster for Moderna series) 11/04/2020 (Originally 01/07/2020)   Zoster Vaccines- Shingrix (1 of 2) 01/19/2021 (Originally 03/30/1969)   DEXA SCAN  10/19/2021 (Originally 09/18/2017)   COLONOSCOPY (Pts 45-54yrs Insurance coverage will need to be confirmed)  06/29/2022   MAMMOGRAM  10/12/2022   TETANUS/TDAP  09/06/2024   INFLUENZA VACCINE  Completed   Hepatitis C Screening  Completed   HPV VACCINES  Aged Out   Immunization History  Administered Date(s) Administered   Fluad Quad(high Dose 65+) 10/28/2019   Hepatitis B 03/04/2019   Hepatitis B, adult 10/25/2014, 11/24/2014, 05/14/2015   Influenza Whole 12/12/2009   Influenza, High Dose Seasonal PF 10/06/2016, 10/25/2018, 10/25/2018, 10/12/2020   Influenza,inj,Quad PF,6+ Mos 10/14/2012, 11/01/2013   Influenza-Unspecified 10/25/2014   Moderna Sars-Covid-2 Vaccination 02/18/2019, 03/18/2019, 09/07/2019   PPD Test 01/01/2020   Pneumococcal Conjugate-13 04/25/2014   Pneumococcal Polysaccharide-23 12/12/2009, 12/20/2014   Tdap 09/06/2004, 09/07/2014    These are the patient goals that we discussed:  Goals Addressed               This Visit's Progress     Patient Stated (pt-stated)        10/19/2020 AWV Goal: Exercise for General Health  Patient will verbalize understanding of the benefits of increased physical activity: Exercising regularly is important. It will improve your overall fitness, flexibility, and endurance. Regular exercise also will improve your overall health. It can help you control your weight, reduce stress, and improve your bone density. Over the next year, patient will increase physical  activity as tolerated with a goal of at least 150 minutes of moderate physical activity per week.  You can tell that you are exercising at a moderate intensity if your heart starts beating faster and you start breathing faster but can still hold a conversation. Moderate-intensity exercise ideas include: Walking 1 mile (1.6 km) in about 15 minutes Biking Hiking Golfing Dancing Water aerobics Patient will verbalize understanding of everyday activities that increase physical activity by providing examples like the following: Yard work, such as: Sales promotion account executive Gardening Washing windows or floors Patient will be able to explain general safety guidelines for exercising:  Before you start a new exercise program, talk with your health care provider. Do not exercise so much that you hurt yourself, feel dizzy, or get very short of breath. Wear comfortable clothes and wear shoes with good support. Drink plenty of water while you exercise to prevent dehydration or heat stroke. Work out until your breathing and your heartbeat get faster.          This is a list of Health Maintenance Items that are overdue or due now: Bone densitometry screening Shingles vaccine   Orders/Referrals Placed Today: Orders Placed This Encounter  Procedures   DEXAScan    Standing Status:   Future    Standing Expiration Date:   10/19/2021    Scheduling Instructions:     Please call patient to schedule.    Order Specific Question:   Reason for exam:    Answer:   Post Menopausal    Order Specific Question:   Preferred  imaging location?    Answer:   [facility]    (Contact our referral department at [phone] if you have not spoken with someone about your referral appointment within the next 5 days)    Follow-up Plan Follow-up with Hali Marry, MD as planned Schedule your shingles vaccine at the  pharmacy. Bone density referral has been sent and they will call you to schedule. Medicare wellness visit in one year.  AVS printed and mailed to the patient.        Bone Density Test A bone density test uses a type of X-ray to measure the amount of calcium and other minerals in a person's bones. It can measure bone density in the hip and the spine. The test is similar to having a regular X-ray. This test may also be called: Bone densitometry. Bone mineral density test. Dual-energy X-ray absorptiometry (DEXA). You may have this test to: Diagnose a condition that causes weak or thin bones (osteoporosis). Screen you for osteoporosis. Predict your risk for a broken bone (fracture). Determine how well your osteoporosis treatment is working. Tell a health care provider about: Any allergies you have. All medicines you are taking, including vitamins, herbs, eye drops, creams, and over-the-counter medicines. Any problems you or family members have had with anesthetic medicines. Any blood disorders you have. Any surgeries you have had. Any medical conditions you have. Whether you are pregnant or may be pregnant. Any medical tests you have had within the past 14 days that used contrast material. What are the risks? Generally, this is a safe test. However, it does expose you to a small amount of radiation, which can slightly increase your cancer risk. What happens before the test? Do not take any calcium supplements within the 24 hours before your test. You will need to remove all metal jewelry, eyeglasses, removable dental appliances, and any other metal objects on your body. What happens during the test?  You will lie down on an exam table. There will be an X-ray generator below you and an imaging device above you. Other devices, such as boxes or braces, may be used to position your body properly for the scan. The machine will slowly scan your body. You will need to keep very still while  the machine does the scan. The images will show up on a screen in the room. Images will be examined by a specialist after your test is finished. The procedure may vary among health care providers and hospitals. What can I expect after the test? It is up to you to get the results of your test. Ask your health care provider, or the department that is doing the test, when your results will be ready. Summary A bone density test is an imaging test that uses a type of X-ray to measure the amount of calcium and other minerals in your bones. The test may be used to diagnose or screen you for a condition that causes weak or thin bones (osteoporosis), predict your risk for a broken bone (fracture), or determine how well your osteoporosis treatment is working. Do not take any calcium supplements within 24 hours before your test. Ask your health care provider, or the department that is doing the test, when your results will be ready. This information is not intended to replace advice given to you by your health care provider. Make sure you discuss any questions you have with your health care provider. Document Revised: 06/09/2019 Document Reviewed: 06/09/2019 Elsevier Patient Education  2022 Elsevier  Inc.  

## 2020-10-19 NOTE — Progress Notes (Signed)
MEDICARE ANNUAL WELLNESS VISIT  10/19/2020  Telephone Visit Disclaimer This Medicare AWV was conducted by telephone due to national recommendations for restrictions regarding the COVID-19 Pandemic (e.g. social distancing).  I verified, using two identifiers, that I am speaking with Joy Anderson or their authorized healthcare agent. I discussed the limitations, risks, security, and privacy concerns of performing an evaluation and management service by telephone and the potential availability of an in-person appointment in the future. The patient expressed understanding and agreed to proceed.  Location of Patient: Home Location of Provider (nurse):  Provider home  Subjective:    Joy Anderson is a 70 y.o. female patient of Metheney, Rene Kocher, MD who had a Medicare Annual Wellness Visit today via telephone. Joy Anderson is Retired and lives with their daughter. she has 3  children. she reports that she is socially active and does interact with friends/family regularly. she is minimally physically active and enjoys sewing.  Patient Care Team: Hali Marry, MD as PCP - General (Family Medicine) Aretta Nip, MD as Referring Physician (Rheumatology) Katherina Right. Nicole Kindred, MD (Nephrology) Dr. Haynes Bast (Ophthalmology) Tania Ade Tomasa Blase, RN as [facility] Management  Advanced Directives 10/19/2020 07/21/2019 11/15/2018 08/20/2017 08/19/2016 05/23/2013 04/01/2013  Does Patient Have a Medical Advance Directive? No Yes No No No Patient does not have advance directive;Patient would like information Patient does not have advance directive;Patient would like information  Type of Advance Directive - [place] in Chart? - No - copy requested - - - - -  Would patient like information on creating a medical advance directive? No - Patient declined - No - Patient declined Yes (MAU/Ambulatory/Procedural  Areas - Information given) Yes (MAU/Ambulatory/Procedural Areas - Information given) Advance directive brochure given (Outpatient ONLY) Advance directive packet given    Hospital Utilization Over the Past 12 Months: # of hospitalizations or ER visits: 0 # of surgeries: 0  Review of Systems    Patient reports that her overall health is better compared to last year.  History obtained from chart review and the patient  Patient Reported Readings (BP, Pulse, CBG, Weight, etc) none  Pain Assessment Pain : No/denies pain     Current Medications & Allergies (verified) Allergies as of 10/19/2020       Reactions   Ace Inhibitors    REACTION: cough Other reaction(s): Other (See Comments) REACTION: cough   Metformin    Other reaction(s): Other (See Comments) Nausea, decreased appetite Nausea, decreased appetite   Fish Oil Other (See Comments)   Other reaction(s): Other (See Comments)   Metformin And Related Other (See Comments)   Nausea, decreased appetite        Medication List        Accurate as of October 19, 2020  4:23 PM. If you have any questions, ask your nurse or doctor.          acetaminophen 500 MG tablet Commonly known as: TYLENOL Take 500 mg by mouth every 6 (six) hours as needed. Two tablets   AMBULATORY NON FORMULARY MEDICATION Medication Name: Shower chair   amLODipine 2.5 MG tablet Commonly known as: NORVASC Take 1 tablet by mouth in the evening   atorvastatin 20 MG tablet Commonly known as: LIPITOR Take 1 tablet (20 mg total) by mouth at bedtime. Due for follow up visit   CALCIUM PO Take by mouth.   cinacalcet 30 MG tablet Commonly  known as: SENSIPAR Take 2 tablets by mouth daily.   hydrocortisone 2.5 % cream Apply 1 application topically 2 (two) times daily as needed.   hydroxychloroquine 200 MG tablet Commonly known as: PLAQUENIL Take 200 mg by mouth 2 (two) times daily. Takes it once a day.   lansoprazole 30 MG capsule Commonly  known as: Prevacid Take 1 capsule (30 mg total) by mouth in the morning. About 20 min before first meal of day   multivitamin Tabs tablet   warfarin 5 MG tablet Commonly known as: COUMADIN TAKE 1 TABLET BY MOUTH ON SUNDAY, WENESDAY, THURSDAY, FRIDAY, AND SATURDAY. THEN TAKE 1 AND 1/2 TABLET ON MONDAY AND TUESDAY What changed: additional instructions   warfarin 1 MG tablet Commonly known as: COUMADIN What changed: Another medication with the same name was changed. Make sure you understand how and when to take each.   warfarin 4 MG tablet Commonly known as: COUMADIN Take by mouth. What changed: Another medication with the same name was changed. Make sure you understand how and when to take each.        History (reviewed): Past Medical History:  Diagnosis Date   Antiphospholipid antibody with hypercoagulable state ([place]) 08/27/2010   DVT (deep venous thrombosis) ([facility])    anti phospholipid antibody + -- Dr Lewanda Rife   Hypertension    OAB (overactive bladder)    off meds   Post-menopausal    Proteinuria    Dr Tressie Ellis   Raynaud's disease    Past Surgical History:  Procedure Laterality Date   AV FISTULA PLACEMENT  01/06/2014   CATARACT EXTRACTION, BILATERAL  01/07/2015   HIP SURGERY Right 10/2018   left one done 10/2019   TOTAL ABDOMINAL HYSTERECTOMY  01/07/1999   for fibroids w/ 1 oophorectomy   Family History  Problem Relation Age of Onset   Alcohol abuse Father    Colon cancer Brother    Prostate cancer Brother    Cerebral palsy Brother    Diabetes Other    Social History   Socioeconomic History   Marital status: Divorced    Spouse name: Not on file   Number of children: 3   Years of education: 12   Highest education level: 12th grade  Occupational History   Not on file  Tobacco Use   Smoking status: Never   Smokeless tobacco: Never  Substance and Sexual Activity   Alcohol use: Yes    Comment: occasional   Drug use: No   Sexual activity: Not on file   Other Topics Concern   Not on file  Social History Narrative   Lives with her daughter. She enjoys sewing.    Social Determinants of Health   Financial Resource Strain: Low Risk    Difficulty of Paying Living Expenses: Not hard at all  Food Insecurity: No Food Insecurity   Worried About Charity fundraiser in the Last Year: Never true   [place] in the Last Year: Never true  Transportation Needs: No Transportation Needs   Lack of Transportation (Medical): No   Lack of Transportation (Non-Medical): No  Physical Activity: Inactive   Days of Exercise per Week: 0 days   Minutes of Exercise per Session: 0 min  Stress: No Stress Concern Present   Feeling of Stress : Not at all  Social Connections: Socially Isolated   Frequency of Communication with Friends and Family: More than three times a week   Frequency of Social Gatherings with Friends and Family: Once  a week   Attends Religious Services: Never   Active Member of Clubs or Organizations: No   Attends Archivist Meetings: Never   Marital Status: Divorced    Activities of Daily Living In your present state of health, do you have any difficulty performing the following activities: 10/19/2020  Hearing? N  Vision? N  Difficulty concentrating or making decisions? N  Walking or climbing stairs? Y  Comment She has had two hip replacements and she is still working on them to get better with climbing stairs.  Dressing or bathing? N  Doing errands, shopping? N  Preparing Food and eating ? N  Using the Toilet? N  In the past six months, have you accidently leaked urine? N  Do you have problems with loss of bowel control? N  Managing your Medications? N  Managing your Finances? N  Housekeeping or managing your Housekeeping? N  Some recent data might be hidden    Patient Education/ Literacy How often do you need to have someone help you when you read instructions, pamphlets, or other written materials from your  doctor or pharmacy?: 1 - Never What is the last grade level you completed in school?: 12th grade  Exercise Current Exercise Habits: Home exercise routine, Type of exercise: walking;strength training/weights, Time (Minutes): 30, Frequency (Times/Week): 3, Weekly Exercise (Minutes/Week): 90, Intensity: Mild, Exercise limited by: orthopedic condition(s)  Diet Patient reports consuming 2 meals a day and 2-4 snack(s) a day Patient reports that her primary diet is: Regular Patient reports that she does have regular access to food.   Depression Screen PHQ 2/9 Scores 10/19/2020 09/28/2020 01/19/2020 11/15/2018 08/20/2017 08/19/2016 08/16/2015  PHQ - 2 Score 0 0 0 0 0 0 0     Fall Risk Fall Risk  10/19/2020 09/28/2020 01/19/2020 07/21/2019 03/01/2019  Falls in the past year? 1 0 0 0 0  Number falls in past yr: 0 0 - 0 0  Injury with Fall? 1 0 - - 0  Risk for fall due to : History of fall(s) No Fall Risks No Fall Risks Impaired mobility Medication side effect  Follow up Falls evaluation completed;Education provided;Falls prevention discussed Falls prevention discussed;Falls evaluation completed - - Falls evaluation completed;Education provided     Objective:  Joy Anderson seemed alert and oriented and she participated appropriately during our telephone visit.  Blood Pressure Weight BMI  BP Readings from Last 3 Encounters:  09/28/20 (!) 154/60  07/24/20 (!) 138/57  06/14/20 139/80   Wt Readings from Last 3 Encounters:  09/28/20 140 lb (63.5 kg)  07/24/20 143 lb (64.9 kg)  05/01/20 147 lb (66.7 kg)   BMI Readings from Last 1 Encounters:  09/28/20 25.61 kg/m    *Unable to obtain current vital signs, weight, and BMI due to telephone visit type  Hearing/Vision  Joy Anderson did not seem to have difficulty with hearing/understanding during the telephone conversation Reports that she has not had a formal eye exam by an eye care professional within the past year Reports that she has not had a formal  hearing evaluation within the past year *Unable to fully assess hearing and vision during telephone visit type  Cognitive Function: 6CIT Screen 10/19/2020 07/21/2019 08/20/2017 08/20/2016  What Year? 0 points 0 points 0 points 0 points  What month? 0 points 0 points 0 points 0 points  What time? 0 points 0 points 0 points 0 points  Count back from 20 0 points 0 points 0 points 0 points  Months in  reverse 2 points 0 points 0 points 0 points  Repeat phrase 2 points 0 points 2 points 0 points  Total Score 4 0 2 0   (Normal:0-7, Significant for Dysfunction: >8)  Normal Cognitive Function Screening: Yes   Immunization & Health Maintenance Record Immunization History  Administered Date(s) Administered   Fluad Quad(high Dose 65+) 10/28/2019   Hepatitis B 03/04/2019   Hepatitis B, adult 10/25/2014, 11/24/2014, 05/14/2015   Influenza Whole 12/12/2009   Influenza, High Dose Seasonal PF 10/06/2016, 10/25/2018, 10/25/2018, 10/12/2020   Influenza,inj,Quad PF,6+ Mos 10/14/2012, 11/01/2013   Influenza-Unspecified 10/25/2014   Moderna Sars-Covid-2 Vaccination 02/18/2019, 03/18/2019, 09/07/2019   PPD Test 01/01/2020   Pneumococcal Conjugate-13 04/25/2014   Pneumococcal Polysaccharide-23 12/12/2009, 12/20/2014   Tdap 09/06/2004, 09/07/2014    Health Maintenance  Topic Date Due   COVID-19 Vaccine (4 - Booster for Moderna series) 11/04/2020 (Originally 01/07/2020)   Zoster Vaccines- Shingrix (1 of 2) 01/19/2021 (Originally 03/30/1969)   DEXA SCAN  10/19/2021 (Originally 09/18/2017)   COLONOSCOPY (Pts 45-45yrs Insurance coverage will need to be confirmed)  06/29/2022   MAMMOGRAM  10/12/2022   TETANUS/TDAP  09/06/2024   INFLUENZA VACCINE  Completed   Hepatitis C Screening  Completed   HPV VACCINES  Aged Out       Assessment  This is a routine wellness examination for Joy Anderson.  Health Maintenance: Due or Overdue There are no preventive care reminders to display for this  patient.   Joy Anderson does not need a referral for Community Assistance: Care Management:   no Social Work:    no Prescription Assistance:  no Nutrition/Diabetes Education:  no   Plan:  Personalized Goals  Goals Addressed               This Visit's Progress     Patient Stated (pt-stated)        10/19/2020 AWV Goal: Exercise for General Health  Patient will verbalize understanding of the benefits of increased physical activity: Exercising regularly is important. It will improve your overall fitness, flexibility, and endurance. Regular exercise also will improve your overall health. It can help you control your weight, reduce stress, and improve your bone density. Over the next year, patient will increase physical activity as tolerated with a goal of at least 150 minutes of moderate physical activity per week.  You can tell that you are exercising at a moderate intensity if your heart starts beating faster and you start breathing faster but can still hold a conversation. Moderate-intensity exercise ideas include: Walking 1 mile (1.6 km) in about 15 minutes Biking Hiking Golfing Dancing Water aerobics Patient will verbalize understanding of everyday activities that increase physical activity by providing examples like the following: Yard work, such as: Sales promotion account executive Gardening Washing windows or floors Patient will be able to explain general safety guidelines for exercising:  Before you start a new exercise program, talk with your health care provider. Do not exercise so much that you hurt yourself, feel dizzy, or get very short of breath. Wear comfortable clothes and wear shoes with good support. Drink plenty of water while you exercise to prevent dehydration or heat stroke. Work out until your breathing and your heartbeat get faster.        Personalized Health Maintenance &  Screening Recommendations  Bone densitometry screening Shingles vaccine  Lung Cancer Screening Recommended: no (Low Dose CT Chest recommended if Age 32-80 years,  30 pack-year currently smoking OR have quit w/in past 15 years) Hepatitis C Screening recommended: no HIV Screening recommended: no  Advanced Directives: Written information was not prepared per patient's request.  Referrals & Orders Orders Placed This Encounter  Procedures   [place]    Follow-up Plan Follow-up with Hali Marry, MD as planned Schedule your shingles vaccine at the pharmacy. Bone density referral has been sent and they will call you to schedule. Medicare wellness visit in one year.  AVS printed and mailed to the patient.   I have personally reviewed and noted the following in the patient's chart:   Medical and social history Use of alcohol, tobacco or illicit drugs  Current medications and supplements Functional ability and status Nutritional status Physical activity Advanced directives List of other physicians Hospitalizations, surgeries, and ER visits in previous 12 months Vitals Screenings to include cognitive, depression, and falls Referrals and appointments  In addition, I have reviewed and discussed with Joy Anderson certain preventive protocols, quality metrics, and best practice recommendations. A written personalized care plan for preventive services as well as general preventive health recommendations is available and can be mailed to the patient at her request.      Tinnie Gens, RN  10/19/2020

## 2020-10-22 DIAGNOSIS — I82409 Acute embolism and thrombosis of unspecified deep veins of unspecified lower extremity: Secondary | ICD-10-CM | POA: Diagnosis not present

## 2020-10-22 DIAGNOSIS — N186 End stage renal disease: Secondary | ICD-10-CM | POA: Diagnosis not present

## 2020-10-22 DIAGNOSIS — N2581 Secondary hyperparathyroidism of renal origin: Secondary | ICD-10-CM | POA: Diagnosis not present

## 2020-10-22 DIAGNOSIS — Z23 Encounter for immunization: Secondary | ICD-10-CM | POA: Diagnosis not present

## 2020-10-22 DIAGNOSIS — D631 Anemia in chronic kidney disease: Secondary | ICD-10-CM | POA: Diagnosis not present

## 2020-10-22 DIAGNOSIS — D509 Iron deficiency anemia, unspecified: Secondary | ICD-10-CM | POA: Diagnosis not present

## 2020-10-24 ENCOUNTER — Telehealth: Payer: Self-pay | Admitting: *Deleted

## 2020-10-24 DIAGNOSIS — N2581 Secondary hyperparathyroidism of renal origin: Secondary | ICD-10-CM | POA: Diagnosis not present

## 2020-10-24 DIAGNOSIS — D509 Iron deficiency anemia, unspecified: Secondary | ICD-10-CM | POA: Diagnosis not present

## 2020-10-24 DIAGNOSIS — Z23 Encounter for immunization: Secondary | ICD-10-CM | POA: Diagnosis not present

## 2020-10-24 DIAGNOSIS — D631 Anemia in chronic kidney disease: Secondary | ICD-10-CM | POA: Diagnosis not present

## 2020-10-24 DIAGNOSIS — N186 End stage renal disease: Secondary | ICD-10-CM | POA: Diagnosis not present

## 2020-10-24 NOTE — Telephone Encounter (Signed)
Pt called and stated that she is still experiencing a cough and her voice is getting hoarse she asked that something be sent to her pharmacy for this.   She was seen on 09/28/20 for this and given Lansoprazole 30 mg qd .  She has been taking the mucinex and the nasal spray these haven't helped.  Put her on the schedule for tomorrow.

## 2020-10-25 ENCOUNTER — Encounter: Payer: Self-pay | Admitting: Family Medicine

## 2020-10-25 ENCOUNTER — Ambulatory Visit (INDEPENDENT_AMBULATORY_CARE_PROVIDER_SITE_OTHER): Payer: Medicare HMO

## 2020-10-25 ENCOUNTER — Telehealth (INDEPENDENT_AMBULATORY_CARE_PROVIDER_SITE_OTHER): Payer: Medicare HMO | Admitting: Family Medicine

## 2020-10-25 ENCOUNTER — Other Ambulatory Visit: Payer: Self-pay | Admitting: *Deleted

## 2020-10-25 ENCOUNTER — Other Ambulatory Visit: Payer: Self-pay

## 2020-10-25 DIAGNOSIS — R059 Cough, unspecified: Secondary | ICD-10-CM

## 2020-10-25 DIAGNOSIS — R053 Chronic cough: Secondary | ICD-10-CM

## 2020-10-25 NOTE — Progress Notes (Signed)
Virtual Visit via Telephone Note  I connected with Joy Anderson on 10/25/20 at 11:30 AM EDT by telephone and verified that I am speaking with the correct person using two identifiers.   I discussed the limitations, risks, security and privacy concerns of performing an evaluation and management service by telephone and the availability of in person appointments. I also discussed with the patient that there may be a patient responsible charge related to this service. The patient expressed understanding and agreed to proceed.  Patient location: at home  Provider loccation: In office   Subjective:    CC: Cough   HPI:  Cough  6 weeks at this point.  When I last saw her about a month ago she had already been to the urgent care twice for the cough she had been given a couple rounds of antibiotics when I saw her back she was about 80% better but was still having some drainage and residual cough at that time.  I felt like it was most consistent with a postinfectious cough at that time and recommended that she use her allergy spray and start some Prevacid for possible reflux which could be contributing.  Unfortunately the cough has not resolved.  She did go for a chest x-ray that we ordered this morning.  Chest x-ray is clear. Pt reports that her cough is during the day and at night. She has a clear thick mucus and she sometimes will vomit, she c/o chest soreness and hoarseness, no f/s/c tried mucinex,flonase not working.  She says the cough syrup does not really help.  When she starts coughing it is hard to stop.  She does not notice any shortness of breath.  The only time she hears extra noise her wheezing is while she is coughing if she feels like there is phlegm that is caught.  Some chest pain non the right side. No SOB. Feels like getting gradually worse. Eating can trigger the coughing. Throat feels scratchy. Some post nasal drainage. No nasal congestion.   Past medical history, Surgical history,  Family history not pertinant except as noted below, Social history, Allergies, and medications have been entered into the medical record, reviewed, and corrections made.   Review of Systems: No fevers, chills, night sweats, weight loss, chest pain, or shortness of breath.   Objective:    General: Speaking clearly in complete sentences without any shortness of breath.  Alert and oriented x3.  Normal judgment. No apparent acute distress.    Impression and Recommendations:    Chronic cough-initially when I saw her about a month ago it felt like it was most consistent with a post viral/infectious cough.  This typically improves over time and instead she feels like her symptoms are gradually getting worse again.  The sputum is clear.  Chest x-ray was normal which is reassuring we did discuss potential causes including sinuses and postnasal drip, upper airway cough syndrome, reflux though she did take the PPI without significant improvement, aspiration.  At this point and like to refer her to ENT to look at the upper issues she does have some chronic postnasal drip.  Consider underlying chronic sinusitis is a possibility.  Also refer to pulmonary for further evaluation as well.  Orders Placed This Encounter  Procedures   Ambulatory referral to ENT    Referral Priority:   Routine    Referral Type:   Consultation    Referral Reason:   Specialty Services Required    Requested Specialty:  Otolaryngology    Number of Visits Requested:   1   Ambulatory referral to Pulmonology    Referral Priority:   Routine    Referral Type:   Consultation    Referral Reason:   Specialty Services Required    Requested Specialty:   Pulmonary Disease    Number of Visits Requested:   1       I discussed the assessment and treatment plan with the patient. The patient was provided an opportunity to ask questions and all were answered. The patient agreed with the plan and demonstrated an understanding of the  instructions.   The patient was advised to call back or seek an in-person evaluation if the symptoms worsen or if the condition fails to improve as anticipated.  I provided 21 minutes of non-face-to-face time during this encounter.   [name], MD

## 2020-10-25 NOTE — Progress Notes (Signed)
Pt reports that her cough is during the day and at night. She has a clear thick mucus and she sometimes will vomit, she c/o chest soreness and hoarseness, no f/s/c tried mucinex,flonase not working

## 2020-10-26 DIAGNOSIS — Z23 Encounter for immunization: Secondary | ICD-10-CM | POA: Diagnosis not present

## 2020-10-26 DIAGNOSIS — D631 Anemia in chronic kidney disease: Secondary | ICD-10-CM | POA: Diagnosis not present

## 2020-10-26 DIAGNOSIS — D509 Iron deficiency anemia, unspecified: Secondary | ICD-10-CM | POA: Diagnosis not present

## 2020-10-26 DIAGNOSIS — N2581 Secondary hyperparathyroidism of renal origin: Secondary | ICD-10-CM | POA: Diagnosis not present

## 2020-10-26 DIAGNOSIS — N186 End stage renal disease: Secondary | ICD-10-CM | POA: Diagnosis not present

## 2020-10-29 DIAGNOSIS — D631 Anemia in chronic kidney disease: Secondary | ICD-10-CM | POA: Diagnosis not present

## 2020-10-29 DIAGNOSIS — N2581 Secondary hyperparathyroidism of renal origin: Secondary | ICD-10-CM | POA: Diagnosis not present

## 2020-10-29 DIAGNOSIS — Z23 Encounter for immunization: Secondary | ICD-10-CM | POA: Diagnosis not present

## 2020-10-29 DIAGNOSIS — I82409 Acute embolism and thrombosis of unspecified deep veins of unspecified lower extremity: Secondary | ICD-10-CM | POA: Diagnosis not present

## 2020-10-29 DIAGNOSIS — D509 Iron deficiency anemia, unspecified: Secondary | ICD-10-CM | POA: Diagnosis not present

## 2020-10-29 DIAGNOSIS — N186 End stage renal disease: Secondary | ICD-10-CM | POA: Diagnosis not present

## 2020-10-31 DIAGNOSIS — D509 Iron deficiency anemia, unspecified: Secondary | ICD-10-CM | POA: Diagnosis not present

## 2020-10-31 DIAGNOSIS — N2581 Secondary hyperparathyroidism of renal origin: Secondary | ICD-10-CM | POA: Diagnosis not present

## 2020-10-31 DIAGNOSIS — Z23 Encounter for immunization: Secondary | ICD-10-CM | POA: Diagnosis not present

## 2020-10-31 DIAGNOSIS — N186 End stage renal disease: Secondary | ICD-10-CM | POA: Diagnosis not present

## 2020-10-31 DIAGNOSIS — D631 Anemia in chronic kidney disease: Secondary | ICD-10-CM | POA: Diagnosis not present

## 2020-11-02 DIAGNOSIS — D509 Iron deficiency anemia, unspecified: Secondary | ICD-10-CM | POA: Diagnosis not present

## 2020-11-02 DIAGNOSIS — D631 Anemia in chronic kidney disease: Secondary | ICD-10-CM | POA: Diagnosis not present

## 2020-11-02 DIAGNOSIS — N2581 Secondary hyperparathyroidism of renal origin: Secondary | ICD-10-CM | POA: Diagnosis not present

## 2020-11-02 DIAGNOSIS — N186 End stage renal disease: Secondary | ICD-10-CM | POA: Diagnosis not present

## 2020-11-02 DIAGNOSIS — Z23 Encounter for immunization: Secondary | ICD-10-CM | POA: Diagnosis not present

## 2020-11-05 DIAGNOSIS — Z992 Dependence on renal dialysis: Secondary | ICD-10-CM | POA: Diagnosis not present

## 2020-11-05 DIAGNOSIS — Z23 Encounter for immunization: Secondary | ICD-10-CM | POA: Diagnosis not present

## 2020-11-05 DIAGNOSIS — N2581 Secondary hyperparathyroidism of renal origin: Secondary | ICD-10-CM | POA: Diagnosis not present

## 2020-11-05 DIAGNOSIS — D509 Iron deficiency anemia, unspecified: Secondary | ICD-10-CM | POA: Diagnosis not present

## 2020-11-05 DIAGNOSIS — D631 Anemia in chronic kidney disease: Secondary | ICD-10-CM | POA: Diagnosis not present

## 2020-11-05 DIAGNOSIS — N186 End stage renal disease: Secondary | ICD-10-CM | POA: Diagnosis not present

## 2020-11-05 DIAGNOSIS — I82409 Acute embolism and thrombosis of unspecified deep veins of unspecified lower extremity: Secondary | ICD-10-CM | POA: Diagnosis not present

## 2020-11-06 DIAGNOSIS — J3489 Other specified disorders of nose and nasal sinuses: Secondary | ICD-10-CM | POA: Diagnosis not present

## 2020-11-07 DIAGNOSIS — D631 Anemia in chronic kidney disease: Secondary | ICD-10-CM | POA: Diagnosis not present

## 2020-11-07 DIAGNOSIS — N186 End stage renal disease: Secondary | ICD-10-CM | POA: Diagnosis not present

## 2020-11-07 DIAGNOSIS — D509 Iron deficiency anemia, unspecified: Secondary | ICD-10-CM | POA: Diagnosis not present

## 2020-11-07 DIAGNOSIS — E119 Type 2 diabetes mellitus without complications: Secondary | ICD-10-CM | POA: Diagnosis not present

## 2020-11-07 DIAGNOSIS — N2581 Secondary hyperparathyroidism of renal origin: Secondary | ICD-10-CM | POA: Diagnosis not present

## 2020-11-09 DIAGNOSIS — N2581 Secondary hyperparathyroidism of renal origin: Secondary | ICD-10-CM | POA: Diagnosis not present

## 2020-11-09 DIAGNOSIS — D509 Iron deficiency anemia, unspecified: Secondary | ICD-10-CM | POA: Diagnosis not present

## 2020-11-09 DIAGNOSIS — D631 Anemia in chronic kidney disease: Secondary | ICD-10-CM | POA: Diagnosis not present

## 2020-11-09 DIAGNOSIS — N186 End stage renal disease: Secondary | ICD-10-CM | POA: Diagnosis not present

## 2020-11-12 DIAGNOSIS — D631 Anemia in chronic kidney disease: Secondary | ICD-10-CM | POA: Diagnosis not present

## 2020-11-12 DIAGNOSIS — N186 End stage renal disease: Secondary | ICD-10-CM | POA: Diagnosis not present

## 2020-11-12 DIAGNOSIS — N2581 Secondary hyperparathyroidism of renal origin: Secondary | ICD-10-CM | POA: Diagnosis not present

## 2020-11-12 DIAGNOSIS — I82409 Acute embolism and thrombosis of unspecified deep veins of unspecified lower extremity: Secondary | ICD-10-CM | POA: Diagnosis not present

## 2020-11-12 DIAGNOSIS — D509 Iron deficiency anemia, unspecified: Secondary | ICD-10-CM | POA: Diagnosis not present

## 2020-11-14 ENCOUNTER — Other Ambulatory Visit: Payer: Self-pay

## 2020-11-14 ENCOUNTER — Ambulatory Visit (INDEPENDENT_AMBULATORY_CARE_PROVIDER_SITE_OTHER): Payer: Medicare HMO

## 2020-11-14 DIAGNOSIS — Z0000 Encounter for general adult medical examination without abnormal findings: Secondary | ICD-10-CM | POA: Diagnosis not present

## 2020-11-14 DIAGNOSIS — N2581 Secondary hyperparathyroidism of renal origin: Secondary | ICD-10-CM | POA: Diagnosis not present

## 2020-11-14 DIAGNOSIS — M8589 Other specified disorders of bone density and structure, multiple sites: Secondary | ICD-10-CM | POA: Diagnosis not present

## 2020-11-14 DIAGNOSIS — D631 Anemia in chronic kidney disease: Secondary | ICD-10-CM | POA: Diagnosis not present

## 2020-11-14 DIAGNOSIS — Z780 Asymptomatic menopausal state: Secondary | ICD-10-CM

## 2020-11-14 DIAGNOSIS — N186 End stage renal disease: Secondary | ICD-10-CM | POA: Diagnosis not present

## 2020-11-14 DIAGNOSIS — D509 Iron deficiency anemia, unspecified: Secondary | ICD-10-CM | POA: Diagnosis not present

## 2020-11-15 DIAGNOSIS — R6889 Other general symptoms and signs: Secondary | ICD-10-CM | POA: Diagnosis not present

## 2020-11-15 NOTE — Progress Notes (Signed)
Call patient: Bone density test shows a T score of -2.1.  This is consistent with osteopenia which is mildly thin bones.   The current recommendation for osteopenia (mildly thin bones) treatment includes:   #1 vitamin D-recommend 800 international units daily. #2 exercise-recommend 30 minutes of weightbearing exercise 3 days a week.  Resistance training ,such as doing bands and light weights, can be particularly helpful.

## 2020-11-16 DIAGNOSIS — N186 End stage renal disease: Secondary | ICD-10-CM | POA: Diagnosis not present

## 2020-11-16 DIAGNOSIS — N2581 Secondary hyperparathyroidism of renal origin: Secondary | ICD-10-CM | POA: Diagnosis not present

## 2020-11-16 DIAGNOSIS — D631 Anemia in chronic kidney disease: Secondary | ICD-10-CM | POA: Diagnosis not present

## 2020-11-16 DIAGNOSIS — D509 Iron deficiency anemia, unspecified: Secondary | ICD-10-CM | POA: Diagnosis not present

## 2020-11-19 DIAGNOSIS — D631 Anemia in chronic kidney disease: Secondary | ICD-10-CM | POA: Diagnosis not present

## 2020-11-19 DIAGNOSIS — I82409 Acute embolism and thrombosis of unspecified deep veins of unspecified lower extremity: Secondary | ICD-10-CM | POA: Diagnosis not present

## 2020-11-19 DIAGNOSIS — N2581 Secondary hyperparathyroidism of renal origin: Secondary | ICD-10-CM | POA: Diagnosis not present

## 2020-11-19 DIAGNOSIS — N186 End stage renal disease: Secondary | ICD-10-CM | POA: Diagnosis not present

## 2020-11-19 DIAGNOSIS — D509 Iron deficiency anemia, unspecified: Secondary | ICD-10-CM | POA: Diagnosis not present

## 2020-11-21 DIAGNOSIS — D631 Anemia in chronic kidney disease: Secondary | ICD-10-CM | POA: Diagnosis not present

## 2020-11-21 DIAGNOSIS — N2581 Secondary hyperparathyroidism of renal origin: Secondary | ICD-10-CM | POA: Diagnosis not present

## 2020-11-21 DIAGNOSIS — N186 End stage renal disease: Secondary | ICD-10-CM | POA: Diagnosis not present

## 2020-11-21 DIAGNOSIS — D509 Iron deficiency anemia, unspecified: Secondary | ICD-10-CM | POA: Diagnosis not present

## 2020-11-23 DIAGNOSIS — N186 End stage renal disease: Secondary | ICD-10-CM | POA: Diagnosis not present

## 2020-11-23 DIAGNOSIS — D631 Anemia in chronic kidney disease: Secondary | ICD-10-CM | POA: Diagnosis not present

## 2020-11-23 DIAGNOSIS — D509 Iron deficiency anemia, unspecified: Secondary | ICD-10-CM | POA: Diagnosis not present

## 2020-11-23 DIAGNOSIS — N2581 Secondary hyperparathyroidism of renal origin: Secondary | ICD-10-CM | POA: Diagnosis not present

## 2020-11-25 DIAGNOSIS — D509 Iron deficiency anemia, unspecified: Secondary | ICD-10-CM | POA: Diagnosis not present

## 2020-11-25 DIAGNOSIS — I82409 Acute embolism and thrombosis of unspecified deep veins of unspecified lower extremity: Secondary | ICD-10-CM | POA: Diagnosis not present

## 2020-11-25 DIAGNOSIS — D631 Anemia in chronic kidney disease: Secondary | ICD-10-CM | POA: Diagnosis not present

## 2020-11-25 DIAGNOSIS — N186 End stage renal disease: Secondary | ICD-10-CM | POA: Diagnosis not present

## 2020-11-25 DIAGNOSIS — N2581 Secondary hyperparathyroidism of renal origin: Secondary | ICD-10-CM | POA: Diagnosis not present

## 2020-11-27 DIAGNOSIS — N2581 Secondary hyperparathyroidism of renal origin: Secondary | ICD-10-CM | POA: Diagnosis not present

## 2020-11-27 DIAGNOSIS — D509 Iron deficiency anemia, unspecified: Secondary | ICD-10-CM | POA: Diagnosis not present

## 2020-11-27 DIAGNOSIS — D631 Anemia in chronic kidney disease: Secondary | ICD-10-CM | POA: Diagnosis not present

## 2020-11-27 DIAGNOSIS — N186 End stage renal disease: Secondary | ICD-10-CM | POA: Diagnosis not present

## 2020-11-30 DIAGNOSIS — N186 End stage renal disease: Secondary | ICD-10-CM | POA: Diagnosis not present

## 2020-11-30 DIAGNOSIS — D509 Iron deficiency anemia, unspecified: Secondary | ICD-10-CM | POA: Diagnosis not present

## 2020-11-30 DIAGNOSIS — N2581 Secondary hyperparathyroidism of renal origin: Secondary | ICD-10-CM | POA: Diagnosis not present

## 2020-11-30 DIAGNOSIS — D631 Anemia in chronic kidney disease: Secondary | ICD-10-CM | POA: Diagnosis not present

## 2020-12-03 DIAGNOSIS — I82409 Acute embolism and thrombosis of unspecified deep veins of unspecified lower extremity: Secondary | ICD-10-CM | POA: Diagnosis not present

## 2020-12-03 DIAGNOSIS — D631 Anemia in chronic kidney disease: Secondary | ICD-10-CM | POA: Diagnosis not present

## 2020-12-03 DIAGNOSIS — N186 End stage renal disease: Secondary | ICD-10-CM | POA: Diagnosis not present

## 2020-12-03 DIAGNOSIS — D509 Iron deficiency anemia, unspecified: Secondary | ICD-10-CM | POA: Diagnosis not present

## 2020-12-03 DIAGNOSIS — N2581 Secondary hyperparathyroidism of renal origin: Secondary | ICD-10-CM | POA: Diagnosis not present

## 2020-12-05 DIAGNOSIS — N186 End stage renal disease: Secondary | ICD-10-CM | POA: Diagnosis not present

## 2020-12-05 DIAGNOSIS — D509 Iron deficiency anemia, unspecified: Secondary | ICD-10-CM | POA: Diagnosis not present

## 2020-12-05 DIAGNOSIS — D631 Anemia in chronic kidney disease: Secondary | ICD-10-CM | POA: Diagnosis not present

## 2020-12-05 DIAGNOSIS — Z992 Dependence on renal dialysis: Secondary | ICD-10-CM | POA: Diagnosis not present

## 2020-12-05 DIAGNOSIS — N2581 Secondary hyperparathyroidism of renal origin: Secondary | ICD-10-CM | POA: Diagnosis not present

## 2020-12-06 ENCOUNTER — Other Ambulatory Visit: Payer: Self-pay

## 2020-12-06 NOTE — Patient Outreach (Signed)
[facility]) Care Management  12/06/2020  Joy Anderson [birth year] [id number]   Telephone Assessment    Unsuccessful quarterly outreach attempt to patient.      Plan: RN CM will make quarterly outreach attempt to patient within the month of March. RN CM will send unsuccessful outreach letter to patient.  Enzo Montgomery, RN,BSN,CCM [place] Management Telephonic Care Management Coordinator Direct Phone: [phone] Toll Free: [phone] Fax: [phone]

## 2020-12-07 DIAGNOSIS — D631 Anemia in chronic kidney disease: Secondary | ICD-10-CM | POA: Diagnosis not present

## 2020-12-07 DIAGNOSIS — D509 Iron deficiency anemia, unspecified: Secondary | ICD-10-CM | POA: Diagnosis not present

## 2020-12-07 DIAGNOSIS — N2581 Secondary hyperparathyroidism of renal origin: Secondary | ICD-10-CM | POA: Diagnosis not present

## 2020-12-07 DIAGNOSIS — N186 End stage renal disease: Secondary | ICD-10-CM | POA: Diagnosis not present

## 2020-12-10 ENCOUNTER — Ambulatory Visit: Payer: Self-pay

## 2020-12-10 DIAGNOSIS — I82409 Acute embolism and thrombosis of unspecified deep veins of unspecified lower extremity: Secondary | ICD-10-CM | POA: Diagnosis not present

## 2020-12-10 DIAGNOSIS — D509 Iron deficiency anemia, unspecified: Secondary | ICD-10-CM | POA: Diagnosis not present

## 2020-12-10 DIAGNOSIS — N186 End stage renal disease: Secondary | ICD-10-CM | POA: Diagnosis not present

## 2020-12-10 DIAGNOSIS — N2581 Secondary hyperparathyroidism of renal origin: Secondary | ICD-10-CM | POA: Diagnosis not present

## 2020-12-10 DIAGNOSIS — D631 Anemia in chronic kidney disease: Secondary | ICD-10-CM | POA: Diagnosis not present

## 2020-12-12 DIAGNOSIS — N2581 Secondary hyperparathyroidism of renal origin: Secondary | ICD-10-CM | POA: Diagnosis not present

## 2020-12-12 DIAGNOSIS — N186 End stage renal disease: Secondary | ICD-10-CM | POA: Diagnosis not present

## 2020-12-12 DIAGNOSIS — E119 Type 2 diabetes mellitus without complications: Secondary | ICD-10-CM | POA: Diagnosis not present

## 2020-12-12 DIAGNOSIS — D631 Anemia in chronic kidney disease: Secondary | ICD-10-CM | POA: Diagnosis not present

## 2020-12-12 DIAGNOSIS — D509 Iron deficiency anemia, unspecified: Secondary | ICD-10-CM | POA: Diagnosis not present

## 2020-12-14 DIAGNOSIS — D509 Iron deficiency anemia, unspecified: Secondary | ICD-10-CM | POA: Diagnosis not present

## 2020-12-14 DIAGNOSIS — N186 End stage renal disease: Secondary | ICD-10-CM | POA: Diagnosis not present

## 2020-12-14 DIAGNOSIS — N2581 Secondary hyperparathyroidism of renal origin: Secondary | ICD-10-CM | POA: Diagnosis not present

## 2020-12-14 DIAGNOSIS — D631 Anemia in chronic kidney disease: Secondary | ICD-10-CM | POA: Diagnosis not present

## 2020-12-17 DIAGNOSIS — D509 Iron deficiency anemia, unspecified: Secondary | ICD-10-CM | POA: Diagnosis not present

## 2020-12-17 DIAGNOSIS — I82409 Acute embolism and thrombosis of unspecified deep veins of unspecified lower extremity: Secondary | ICD-10-CM | POA: Diagnosis not present

## 2020-12-17 DIAGNOSIS — N186 End stage renal disease: Secondary | ICD-10-CM | POA: Diagnosis not present

## 2020-12-17 DIAGNOSIS — N2581 Secondary hyperparathyroidism of renal origin: Secondary | ICD-10-CM | POA: Diagnosis not present

## 2020-12-17 DIAGNOSIS — D631 Anemia in chronic kidney disease: Secondary | ICD-10-CM | POA: Diagnosis not present

## 2020-12-19 DIAGNOSIS — N2581 Secondary hyperparathyroidism of renal origin: Secondary | ICD-10-CM | POA: Diagnosis not present

## 2020-12-19 DIAGNOSIS — N186 End stage renal disease: Secondary | ICD-10-CM | POA: Diagnosis not present

## 2020-12-19 DIAGNOSIS — D509 Iron deficiency anemia, unspecified: Secondary | ICD-10-CM | POA: Diagnosis not present

## 2020-12-19 DIAGNOSIS — D631 Anemia in chronic kidney disease: Secondary | ICD-10-CM | POA: Diagnosis not present

## 2020-12-21 DIAGNOSIS — N186 End stage renal disease: Secondary | ICD-10-CM | POA: Diagnosis not present

## 2020-12-21 DIAGNOSIS — N2581 Secondary hyperparathyroidism of renal origin: Secondary | ICD-10-CM | POA: Diagnosis not present

## 2020-12-21 DIAGNOSIS — D631 Anemia in chronic kidney disease: Secondary | ICD-10-CM | POA: Diagnosis not present

## 2020-12-21 DIAGNOSIS — D509 Iron deficiency anemia, unspecified: Secondary | ICD-10-CM | POA: Diagnosis not present

## 2020-12-22 ENCOUNTER — Other Ambulatory Visit: Payer: Self-pay

## 2020-12-22 ENCOUNTER — Emergency Department
Admission: EM | Admit: 2020-12-22 | Discharge: 2020-12-22 | Disposition: A | Discharge status: Home / Self Care | Payer: Medicare HMO | Source: Home / Self Care

## 2020-12-22 DIAGNOSIS — U071 COVID-19: Secondary | ICD-10-CM

## 2020-12-22 MED ORDER — MOLNUPIRAVIR EUA 200MG CAPSULE
4.0000 | ORAL_CAPSULE | Freq: Two times a day (BID) | ORAL | 0 refills | Status: AC
Start: 2020-12-22 — End: 2020-12-27

## 2020-12-22 NOTE — Discharge Instructions (Addendum)
Advised patient to take medication as directed with food to completion.  Advised patient to notify dialysis or her nephrologist about current diagnosis of COVID-19 and this current medication that has been prescribed to her.

## 2020-12-22 NOTE — ED Triage Notes (Signed)
Pt states that she has some nasal congestion, cough, and sneezing. X5 days  Pt states that she tested positive 12/21/20.  Pt states that she is vaccinated for covid.  Pt states that she has had flu vaccine.

## 2020-12-22 NOTE — ED Provider Notes (Signed)
[facility] C[facility]    CSN: [id number] Arrival date & time: 12/22/20  1355      History   Chief Complaint Chief Complaint  Patient presents with   Nasal Congestion    Nasal congestion, cough, and sneezing. X5 days    HPI Joy Anderson is a 70 y.o. female.   HPI 70 year old female presents with nasal congestion cough and sneezing for 5 days.  Patient reports testing positive for COVID-19 yesterday, 12/21/2020.  PMH significant for pulmonary hypertension, and ESRD currently on dialysis.   Past Medical History:  Diagnosis Date   Antiphospholipid antibody with hypercoagulable state ([place]) 08/27/2010   DVT (deep venous thrombosis) (HCC)    anti phospholipid antibody + -- Dr Lewanda Rife   Hypertension    OAB (overactive bladder)    off meds   Post-menopausal    Proteinuria    Dr Tressie Ellis   Raynaud's disease     Patient Active Problem List   Diagnosis Date Noted   Hemorrhoids 09/29/2020   Bone infarct of distal femur, left with osteoarthritis 06/20/2020   Elevated triglycerides with high cholesterol 04/13/2017   Adnexal cyst 01/17/2017   Osteoarthritis, hip, bilateral 10/31/2015   Arteriovenous fistula for hemodialysis in place, primary ([place]) 04/30/2015   ESRD on dialysis ([place]) 03/27/2015   Uremia 10/12/2014   Glaucoma of both eyes 07/06/2014   Pain in joint of left hip 05/25/2012   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   Antiphospholipid antibody with hypercoagulable state ([place]) 08/27/2010   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   Vitamin D deficiency 08/08/2010   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 07/15/2010   Gout 04/17/2010   PROPTOSIS 12/12/2009   ANEMIA, IRON DEFICIENCY 11/20/2009   FATTY LIVER DISEASE 10/26/2009   PULMONARY HYPERTENSION, MILD 04/14/2008   PALPITATIONS 03/22/2008   RAYNAUD'S DISEASE 02/01/2008   SLE (systemic lupus erythematosus related syndrome) ([place]) 12/06/2007   ESSENTIAL HYPERTENSION,  BENIGN 11/30/2007   POLYARTHRITIS 11/30/2007    Past Surgical History:  Procedure Laterality Date   AV FISTULA PLACEMENT  01/06/2014   CATARACT EXTRACTION, BILATERAL  01/07/2015   HIP SURGERY Right 10/2018   left one done 10/2019   TOTAL ABDOMINAL HYSTERECTOMY  01/07/1999   for fibroids w/ 1 oophorectomy    OB History   No obstetric history on file.      Home Medications    Prior to Admission medications   Medication Sig Start Date End Date Taking? Authorizing Provider  acetaminophen (TYLENOL) 500 MG tablet Take 500 mg by mouth every 6 (six) hours as needed. Two tablets   Yes [provider]  AMBULATORY NON FORMULARY MEDICATION Medication Name: Shower chair 07/24/20  Yes Hali Marry, MD  amLODipine (NORVASC) 2.5 MG tablet Take 1 tablet by mouth in the evening 10/08/20  Yes Hali Marry, MD  atorvastatin (LIPITOR) 20 MG tablet Take 1 tablet (20 mg total) by mouth at bedtime. Due for follow up visit 08/19/16  Yes Hali Marry, MD  CALCIUM PO Take by mouth.   Yes [provider]  cinacalcet (SENSIPAR) 30 MG tablet Take 2 tablets by mouth daily. 11/16/15  Yes [provider]  hydrocortisone 2.5 % cream Apply 1 application topically 2 (two) times daily as needed. 07/24/20  Yes Hali Marry, MD  hydroxychloroquine (PLAQUENIL) 200 MG tablet Take 200 mg by mouth 2 (two) times daily. Takes it once a day.   Yes [provider]  lansoprazole (PREVACID) 30 MG capsule Take 1 capsule (30  mg total) by mouth in the morning. About 20 min before first meal of day 09/28/20  Yes Metheney, Rene Kocher, MD  molnupiravir EUA (LAGEVRIO) 200 mg CAPS capsule Take 4 capsules (800 mg total) by mouth 2 (two) times daily for 5 days. 12/22/20 12/27/20 Yes Eliezer Lofts, FNP  multivitamin (RENA-VIT) TABS tablet  07/11/15  Yes [provider]  warfarin (COUMADIN) 1 MG tablet  07/10/15  Yes [provider]  warfarin (COUMADIN) 4 MG  tablet Take by mouth. 01/14/17  Yes [provider]  warfarin (COUMADIN) 5 MG tablet TAKE 1 TABLET BY MOUTH ON SUNDAY, WENESDAY, [place], FRIDAY, AND SATURDAY. THEN TAKE 1 AND 1/2 TABLET ON MONDAY AND TUESDAY Patient taking differently: No sig reported 04/25/14  Yes Hali Marry, MD    Family History Family History  Problem Relation Age of Onset   Alcohol abuse Father    Colon cancer Brother    Prostate cancer Brother    Cerebral palsy Brother    Diabetes Other     Social History Social History   Tobacco Use   Smoking status: Never   Smokeless tobacco: Never  Substance Use Topics   Alcohol use: Yes    Comment: occasional   Drug use: No     Allergies   Ace inhibitors, Metformin, Fish oil, and Metformin and related   Review of Systems Review of Systems  HENT:  Positive for congestion and sore throat.   Respiratory:  Positive for cough.   All other systems reviewed and are negative.   Physical Exam Triage Vital Signs ED Triage Vitals  Enc Vitals Group     BP 12/22/20 1554 (!) 168/79     Pulse Rate 12/22/20 1554 80     Resp 12/22/20 1554 16     Temp 12/22/20 1554 99.3 F (37.4 C)     Temp Source 12/22/20 1554 Oral     SpO2 12/22/20 1554 96 %     Weight 12/22/20 1549 140 lb (63.5 kg)     Height 12/22/20 1549 5' 1.5" (1.562 m)     Head Circumference --      Peak Flow --      Pain Score 12/22/20 1549 0     Pain Loc --      Pain Edu? --      Excl. in [place]? --    No data found.  Updated Vital Signs BP (!) 168/79 (BP Location: Left Arm)    Pulse 80    Temp 99.3 F (37.4 C) (Oral)    Resp 16    Ht 5' 1.5" (1.562 m)    Wt 140 lb (63.5 kg)    LMP 03/18/1999    SpO2 96%    BMI 26.02 kg/m      Physical Exam Vitals and nursing note reviewed.  Constitutional:      Appearance: Normal appearance. She is normal weight.  HENT:     Head: Normocephalic and atraumatic.     Right Ear: Tympanic membrane, ear canal and external ear normal.     Left Ear:  Tympanic membrane, ear canal and external ear normal.     Mouth/Throat:     Mouth: Mucous membranes are moist.     Pharynx: Oropharynx is clear.  Eyes:     Extraocular Movements: Extraocular movements intact.     Conjunctiva/sclera: Conjunctivae normal.     Pupils: Pupils are equal, round, and reactive to light.  Cardiovascular:     Rate and Rhythm: Normal  rate and regular rhythm.     Pulses: Normal pulses.     Heart sounds: Normal heart sounds.  Pulmonary:     Effort: Pulmonary effort is normal.     Breath sounds: Normal breath sounds.  Musculoskeletal:     Cervical back: Normal range of motion and neck supple.  Skin:    General: Skin is warm and dry.  Neurological:     General: No focal deficit present.     Mental Status: She is alert and oriented to person, place, and time.     UC Treatments / Results  Labs (all labs ordered are listed, but only abnormal results are displayed) Labs Reviewed - No data to display  EKG   Radiology No results found.  Procedures Procedures (including critical care time)  Medications Ordered in UC Medications - No data to display  Initial Impression / Assessment and Plan / UC Course  I have reviewed the triage vital signs and the nursing notes.  Pertinent labs & imaging results that were available during my care of the patient were reviewed by me and considered in my medical decision making (see chart for details).     MDM: 1.  COVID-19-Rx'd Molupiravir-Advised patient to take medication as directed with food to completion.  Advised patient to notify dialysis or her nephrologist about current diagnosis of COVID-19 and this current medication that has been prescribed to her.  Patient discharged home, hemodynamically stable. Final Clinical Impressions(s) / UC Diagnoses   Final diagnoses:  [id number]     Discharge Instructions      Advised patient to take medication as directed with food to completion.  Advised patient to notify  dialysis or her nephrologist about current diagnosis of COVID-19 and this current medication that has been prescribed to her.     ED Prescriptions     Medication Sig Dispense Auth. Provider   molnupiravir EUA (LAGEVRIO) 200 mg CAPS capsule Take 4 capsules (800 mg total) by mouth 2 (two) times daily for 5 days. 40 capsule Eliezer Lofts, FNP      PDMP not reviewed this encounter.   Eliezer Lofts, [place] 12/22/20 1728

## 2020-12-24 DIAGNOSIS — D631 Anemia in chronic kidney disease: Secondary | ICD-10-CM | POA: Diagnosis not present

## 2020-12-24 DIAGNOSIS — I82409 Acute embolism and thrombosis of unspecified deep veins of unspecified lower extremity: Secondary | ICD-10-CM | POA: Diagnosis not present

## 2020-12-24 DIAGNOSIS — N2581 Secondary hyperparathyroidism of renal origin: Secondary | ICD-10-CM | POA: Diagnosis not present

## 2020-12-24 DIAGNOSIS — N186 End stage renal disease: Secondary | ICD-10-CM | POA: Diagnosis not present

## 2020-12-24 DIAGNOSIS — D509 Iron deficiency anemia, unspecified: Secondary | ICD-10-CM | POA: Diagnosis not present

## 2020-12-26 DIAGNOSIS — N2581 Secondary hyperparathyroidism of renal origin: Secondary | ICD-10-CM | POA: Diagnosis not present

## 2020-12-26 DIAGNOSIS — D509 Iron deficiency anemia, unspecified: Secondary | ICD-10-CM | POA: Diagnosis not present

## 2020-12-26 DIAGNOSIS — N186 End stage renal disease: Secondary | ICD-10-CM | POA: Diagnosis not present

## 2020-12-26 DIAGNOSIS — D631 Anemia in chronic kidney disease: Secondary | ICD-10-CM | POA: Diagnosis not present

## 2020-12-28 DIAGNOSIS — D509 Iron deficiency anemia, unspecified: Secondary | ICD-10-CM | POA: Diagnosis not present

## 2020-12-28 DIAGNOSIS — D631 Anemia in chronic kidney disease: Secondary | ICD-10-CM | POA: Diagnosis not present

## 2020-12-28 DIAGNOSIS — N186 End stage renal disease: Secondary | ICD-10-CM | POA: Diagnosis not present

## 2020-12-28 DIAGNOSIS — N2581 Secondary hyperparathyroidism of renal origin: Secondary | ICD-10-CM | POA: Diagnosis not present

## 2020-12-30 ENCOUNTER — Other Ambulatory Visit: Payer: Self-pay | Admitting: Family Medicine

## 2020-12-30 DIAGNOSIS — I10 Essential (primary) hypertension: Secondary | ICD-10-CM

## 2020-12-31 DIAGNOSIS — D631 Anemia in chronic kidney disease: Secondary | ICD-10-CM | POA: Diagnosis not present

## 2020-12-31 DIAGNOSIS — N2581 Secondary hyperparathyroidism of renal origin: Secondary | ICD-10-CM | POA: Diagnosis not present

## 2020-12-31 DIAGNOSIS — N186 End stage renal disease: Secondary | ICD-10-CM | POA: Diagnosis not present

## 2020-12-31 DIAGNOSIS — I82409 Acute embolism and thrombosis of unspecified deep veins of unspecified lower extremity: Secondary | ICD-10-CM | POA: Diagnosis not present

## 2020-12-31 DIAGNOSIS — D509 Iron deficiency anemia, unspecified: Secondary | ICD-10-CM | POA: Diagnosis not present

## 2021-01-02 DIAGNOSIS — D509 Iron deficiency anemia, unspecified: Secondary | ICD-10-CM | POA: Diagnosis not present

## 2021-01-02 DIAGNOSIS — N186 End stage renal disease: Secondary | ICD-10-CM | POA: Diagnosis not present

## 2021-01-02 DIAGNOSIS — D631 Anemia in chronic kidney disease: Secondary | ICD-10-CM | POA: Diagnosis not present

## 2021-01-02 DIAGNOSIS — N2581 Secondary hyperparathyroidism of renal origin: Secondary | ICD-10-CM | POA: Diagnosis not present

## 2021-01-02 DIAGNOSIS — I82409 Acute embolism and thrombosis of unspecified deep veins of unspecified lower extremity: Secondary | ICD-10-CM | POA: Diagnosis not present

## 2021-01-04 DIAGNOSIS — D631 Anemia in chronic kidney disease: Secondary | ICD-10-CM | POA: Diagnosis not present

## 2021-01-04 DIAGNOSIS — N186 End stage renal disease: Secondary | ICD-10-CM | POA: Diagnosis not present

## 2021-01-04 DIAGNOSIS — D509 Iron deficiency anemia, unspecified: Secondary | ICD-10-CM | POA: Diagnosis not present

## 2021-01-04 DIAGNOSIS — N2581 Secondary hyperparathyroidism of renal origin: Secondary | ICD-10-CM | POA: Diagnosis not present

## 2021-01-05 DIAGNOSIS — N186 End stage renal disease: Secondary | ICD-10-CM | POA: Diagnosis not present

## 2021-01-05 DIAGNOSIS — Z992 Dependence on renal dialysis: Secondary | ICD-10-CM | POA: Diagnosis not present

## 2021-01-24 ENCOUNTER — Ambulatory Visit: Payer: Medicare HMO | Admitting: Family Medicine

## 2021-02-06 DIAGNOSIS — N2581 Secondary hyperparathyroidism of renal origin: Secondary | ICD-10-CM | POA: Diagnosis not present

## 2021-02-06 DIAGNOSIS — Z114 Encounter for screening for human immunodeficiency virus [HIV]: Secondary | ICD-10-CM | POA: Diagnosis not present

## 2021-02-06 DIAGNOSIS — N186 End stage renal disease: Secondary | ICD-10-CM | POA: Diagnosis not present

## 2021-02-06 DIAGNOSIS — D631 Anemia in chronic kidney disease: Secondary | ICD-10-CM | POA: Diagnosis not present

## 2021-02-06 DIAGNOSIS — Z1159 Encounter for screening for other viral diseases: Secondary | ICD-10-CM | POA: Diagnosis not present

## 2021-02-06 DIAGNOSIS — E119 Type 2 diabetes mellitus without complications: Secondary | ICD-10-CM | POA: Diagnosis not present

## 2021-02-06 DIAGNOSIS — D509 Iron deficiency anemia, unspecified: Secondary | ICD-10-CM | POA: Diagnosis not present

## 2021-02-08 DIAGNOSIS — D509 Iron deficiency anemia, unspecified: Secondary | ICD-10-CM | POA: Diagnosis not present

## 2021-02-08 DIAGNOSIS — N186 End stage renal disease: Secondary | ICD-10-CM | POA: Diagnosis not present

## 2021-02-08 DIAGNOSIS — D631 Anemia in chronic kidney disease: Secondary | ICD-10-CM | POA: Diagnosis not present

## 2021-02-08 DIAGNOSIS — N2581 Secondary hyperparathyroidism of renal origin: Secondary | ICD-10-CM | POA: Diagnosis not present

## 2021-02-11 DIAGNOSIS — D631 Anemia in chronic kidney disease: Secondary | ICD-10-CM | POA: Diagnosis not present

## 2021-02-11 DIAGNOSIS — I82409 Acute embolism and thrombosis of unspecified deep veins of unspecified lower extremity: Secondary | ICD-10-CM | POA: Diagnosis not present

## 2021-02-11 DIAGNOSIS — D509 Iron deficiency anemia, unspecified: Secondary | ICD-10-CM | POA: Diagnosis not present

## 2021-02-11 DIAGNOSIS — N186 End stage renal disease: Secondary | ICD-10-CM | POA: Diagnosis not present

## 2021-02-11 DIAGNOSIS — N2581 Secondary hyperparathyroidism of renal origin: Secondary | ICD-10-CM | POA: Diagnosis not present

## 2021-02-12 ENCOUNTER — Ambulatory Visit (INDEPENDENT_AMBULATORY_CARE_PROVIDER_SITE_OTHER): Payer: Medicare PPO

## 2021-02-12 ENCOUNTER — Ambulatory Visit (INDEPENDENT_AMBULATORY_CARE_PROVIDER_SITE_OTHER): Payer: Medicare PPO | Admitting: Family Medicine

## 2021-02-12 ENCOUNTER — Other Ambulatory Visit: Payer: Self-pay

## 2021-02-12 ENCOUNTER — Encounter: Payer: Self-pay | Admitting: Family Medicine

## 2021-02-12 VITALS — BP 126/65 | HR 79 | Resp 16 | Ht 61.5 in | Wt 137.0 lb

## 2021-02-12 DIAGNOSIS — M329 Systemic lupus erythematosus, unspecified: Secondary | ICD-10-CM

## 2021-02-12 DIAGNOSIS — Z992 Dependence on renal dialysis: Secondary | ICD-10-CM | POA: Diagnosis not present

## 2021-02-12 DIAGNOSIS — M8588 Other specified disorders of bone density and structure, other site: Secondary | ICD-10-CM | POA: Diagnosis not present

## 2021-02-12 DIAGNOSIS — R1032 Left lower quadrant pain: Secondary | ICD-10-CM

## 2021-02-12 DIAGNOSIS — M25552 Pain in left hip: Secondary | ICD-10-CM | POA: Diagnosis not present

## 2021-02-12 DIAGNOSIS — M1612 Unilateral primary osteoarthritis, left hip: Secondary | ICD-10-CM | POA: Diagnosis not present

## 2021-02-12 DIAGNOSIS — N186 End stage renal disease: Secondary | ICD-10-CM

## 2021-02-12 DIAGNOSIS — D6861 Antiphospholipid syndrome: Secondary | ICD-10-CM

## 2021-02-12 DIAGNOSIS — I10 Essential (primary) hypertension: Secondary | ICD-10-CM | POA: Diagnosis not present

## 2021-02-12 NOTE — Progress Notes (Signed)
Established Patient Office Visit  Subjective:  Patient ID: Joy Anderson, female    DOB: 1950/11/19  Age: 71 y.o. MRN: [id number]  CC:  Chief Complaint  Patient presents with   Hypertension    Follow up    Groin Pain    Left leg, several months     HPI Joy Anderson presents for   Hypertension- Pt denies chest pain, SOB, dizziness, or heart palpitations.  Taking meds as directed w/o problems.  Denies medication side effects.    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  States he has had pain more in the groin crease area particularly with certain motions.  She says she can walk without significant difficulty and its not painful when she is just sitting and resting.  She does not member any trauma or injury to the area she says it started a little bit before she got sick in December with [place].  But all the coughing that she did for weeks actually aggravated it and made it worse.   Past Medical History:  Diagnosis Date   Antiphospholipid antibody with hypercoagulable state ([place]) 08/27/2010   DVT (deep venous thrombosis) (HCC)    anti phospholipid antibody + -- Dr Lewanda Rife   Hypertension    OAB (overactive bladder)    off meds   Post-menopausal    Proteinuria    Dr Tressie Ellis   Raynaud's disease     Past Surgical History:  Procedure Laterality Date   AV FISTULA PLACEMENT  01/06/2014   CATARACT EXTRACTION, BILATERAL  01/07/2015   HIP SURGERY Right 10/2018   left one done 10/2019   TOTAL ABDOMINAL HYSTERECTOMY  01/07/1999   for fibroids w/ 1 oophorectomy    Family History  Problem Relation Age of Onset   Alcohol abuse Father    Colon cancer Brother    Prostate cancer Brother    Cerebral palsy Brother    Diabetes Other     Social History   Socioeconomic History   Marital status: Divorced    Spouse name: Not on file   Number of children: 3   Years of education: 12   Highest education level:  12th grade  Occupational History   Not on file  Tobacco Use   Smoking status: Never   Smokeless tobacco: Never  Substance and Sexual Activity   Alcohol use: Yes    Comment: occasional   Drug use: No   Sexual activity: Not on file  Other Topics Concern   Not on file  Social History Narrative   Lives with her daughter. She enjoys sewing.    Social Determinants of Health   Financial Resource Strain: Low Risk    Difficulty of Paying Living Expenses: Not hard at all  Food Insecurity: No Food Insecurity   Worried About Charity fundraiser in the Last Year: Never true   [place] in the Last Year: Never true  Transportation Needs: No Transportation Needs   Lack of Transportation (Medical): No   Lack of Transportation (Non-Medical): No  Physical Activity: Inactive   Days of Exercise per Week: 0 days   Minutes of Exercise per Session: 0 min  Stress: No Stress Concern Present   Feeling of Stress : Not at all  Social Connections: Socially Isolated   Frequency of Communication with Friends and Family: More than three times a week   Frequency of Social Gatherings with Friends and  Family: Once a week   Attends Religious Services: Never   Marine scientist or Organizations: No   Attends Music therapist: Never   Marital Status: Divorced  Human resources officer Violence: Not At Risk   Fear of Current or Ex-Partner: No   Emotionally Abused: No   Physically Abused: No   Sexually Abused: No    Outpatient Medications Prior to Visit  Medication Sig Dispense Refill   acetaminophen (TYLENOL) 500 MG tablet Take 500 mg by mouth every 6 (six) hours as needed. Two tablets     AMBULATORY NON FORMULARY MEDICATION Medication Name: Shower chair 1 each 0   amLODipine (NORVASC) 2.5 MG tablet Take 1 tablet by mouth in the evening 90 tablet 1   atorvastatin (LIPITOR) 20 MG tablet Take 1 tablet (20 mg total) by mouth at bedtime. Due for follow up visit 90 tablet 3   CALCIUM PO Take  by mouth.     cinacalcet (SENSIPAR) 30 MG tablet Take 2 tablets by mouth daily.     ferric citrate (AURYXIA) 1 GM 210 MG(Fe) tablet Take 420 mg by mouth 3 (three) times daily with meals.     hydrocortisone 2.5 % cream Apply 1 application topically 2 (two) times daily as needed. 45 g 1   hydroxychloroquine (PLAQUENIL) 200 MG tablet Take 200 mg by mouth 2 (two) times daily. Takes it once a day.     lansoprazole (PREVACID) 30 MG capsule Take 1 capsule (30 mg total) by mouth in the morning. About 20 min before first meal of day 30 capsule 1   multivitamin (RENA-VIT) TABS tablet      warfarin (COUMADIN) 1 MG tablet      warfarin (COUMADIN) 4 MG tablet Take by mouth. Patient takes 4.5 mg daily     warfarin (COUMADIN) 5 MG tablet TAKE 1 TABLET BY MOUTH ON SUNDAY, WENESDAY, THURSDAY, FRIDAY, AND SATURDAY. THEN TAKE 1 AND 1/2 TABLET ON MONDAY AND TUESDAY (Patient not taking: Reported on 02/12/2021) 97 tablet 1   No facility-administered medications prior to visit.    Allergies  Allergen Reactions   Ace Inhibitors     REACTION: cough Other reaction(s): Other (See Comments) REACTION: cough   Metformin     Other reaction(s): Other (See Comments) Nausea, decreased appetite Nausea, decreased appetite   Fish Oil Other (See Comments)    Other reaction(s): Other (See Comments)   Metformin And Related Other (See Comments)    Nausea, decreased appetite    ROS Review of Systems    Objective:    Physical Exam Vitals reviewed.  Constitutional:      Appearance: She is well-developed.  HENT:     Head: Normocephalic and atraumatic.  Eyes:     Conjunctiva/sclera: Conjunctivae normal.  Cardiovascular:     Rate and Rhythm: Normal rate.  Pulmonary:     Effort: Pulmonary effort is normal.  Musculoskeletal:     Comments: Left hip with full internal and external rotation.  Strength is 5 out of 5 with flexion.  She had pain with resistance to abduction and with hip flexion against resistance.  Skin:     General: Skin is dry.  Neurological:     Mental Status: She is alert and oriented to person, place, and time.  Psychiatric:        Behavior: Behavior normal.    BP 126/65    Pulse 79    Resp 16    Ht 5' 1.5" (1.562 m)    Wt  137 lb (62.1 kg)    LMP 03/18/1999    SpO2 99%    BMI 25.47 kg/m  Wt Readings from Last 3 Encounters:  02/12/21 137 lb (62.1 kg)  12/22/20 140 lb (63.5 kg)  09/28/20 140 lb (63.5 kg)     There are no preventive care reminders to display for this patient.   There are no preventive care reminders to display for this patient.  Lab Results  Component Value Date   TSH 2.357 04/25/2014   Lab Results  Component Value Date   WBC 7.5 09/09/2017   HGB 10.0 (A) 09/09/2017   HCT 30 (A) 09/09/2017   MCV 83.0 04/25/2014   PLT 230 09/09/2017   Lab Results  Component Value Date   NA 142 09/02/2018   K 4.2 09/02/2018   CO2 30 09/02/2018   GLUCOSE 90 09/02/2018   BUN 18 09/02/2018   CREATININE 7.79 (H) 09/02/2018   BILITOT 0.4 09/02/2018   ALKPHOS 78 09/09/2017   AST 14 09/02/2018   ALT 15 09/02/2018   PROT 7.5 09/02/2018   ALBUMIN 4.5 08/06/2016   ALBUMIN 4.5 08/06/2016   CALCIUM 9.7 09/02/2018   Lab Results  Component Value Date   CHOL 162 02/28/2020   Lab Results  Component Value Date   HDL 41 (L) 02/28/2020   Lab Results  Component Value Date   LDLCALC 90 02/28/2020   Lab Results  Component Value Date   TRIG 213 (H) 02/28/2020   Lab Results  Component Value Date   CHOLHDL 4.0 02/28/2020   Lab Results  Component Value Date   HGBA1C 5.2 02/28/2020      Assessment & Plan:   Problem List Items Addressed This Visit       Cardiovascular and Mediastinum   ESSENTIAL HYPERTENSION, BENIGN    Well controlled. Continue current regimen. Follow up in  6 mo         Musculoskeletal and Integument   SLE (systemic lupus erythematosus related syndrome) (HCC)    On Plaquenil.        Genitourinary   ESRD on dialysis [facility])    Well with  dialysis blood pressures have actually been maintaining well at the end of her sessions.        Hematopoietic and Hemostatic   Antiphospholipid antibody with hypercoagulable state ([place])    On chronic Coumadin therapy.        Other   Arteriovenous fistula for hemodialysis in place, primary ([place])    Table.  No complications.      Other Visit Diagnoses     Left groin pain    -  Primary   Relevant Orders   DG Hip Unilat W OR W/O Pelvis 2-3 Views Left       Groin pain-suspect tendon or muscle strain based on her exam today.  Strength is actually pretty good and she has excellent range of motion of that hip considering it is a hip replacement.  We discussed getting a plain film x-ray just to rule out any issues with the hardware and then possible formal physical therapy to help.  She has not noticed any swelling redness or rash to indicate infection etc.  No orders of the defined types were placed in this encounter.   Follow-up: Return in about 6 months (around 08/12/2021) for Hypertension.    [name], MD

## 2021-02-12 NOTE — Assessment & Plan Note (Signed)
On chronic Coumadin therapy

## 2021-02-12 NOTE — Assessment & Plan Note (Signed)
On Plaquenil 

## 2021-02-12 NOTE — Assessment & Plan Note (Signed)
Well controlled. Continue current regimen. Follow up in  6 mo  

## 2021-02-12 NOTE — Assessment & Plan Note (Signed)
Table.  No complications.

## 2021-02-12 NOTE — Assessment & Plan Note (Signed)
Well with dialysis blood pressures have actually been maintaining well at the end of her sessions.

## 2021-02-13 DIAGNOSIS — D631 Anemia in chronic kidney disease: Secondary | ICD-10-CM | POA: Diagnosis not present

## 2021-02-13 DIAGNOSIS — N2581 Secondary hyperparathyroidism of renal origin: Secondary | ICD-10-CM | POA: Diagnosis not present

## 2021-02-13 DIAGNOSIS — D509 Iron deficiency anemia, unspecified: Secondary | ICD-10-CM | POA: Diagnosis not present

## 2021-02-13 DIAGNOSIS — N186 End stage renal disease: Secondary | ICD-10-CM | POA: Diagnosis not present

## 2021-02-13 NOTE — Progress Notes (Signed)
I did not see any problems with the joint replacement.  No fracture or dislocation which is good.  They did see some atherosclerosis of the blood vessels which we already know about.  You do have some arthritis in your SI joints but that is in your back.  Not in the groin area.  I think we have 2 options.  We could either consider physical therapy and see if that is helpful or we can get you in with one of our sports med or orthopedic people to just make sure that they feel it is musculoskeletal related.  Okay to go ahead and place referral for which ever she prefers.

## 2021-02-15 DIAGNOSIS — D631 Anemia in chronic kidney disease: Secondary | ICD-10-CM | POA: Diagnosis not present

## 2021-02-15 DIAGNOSIS — D509 Iron deficiency anemia, unspecified: Secondary | ICD-10-CM | POA: Diagnosis not present

## 2021-02-15 DIAGNOSIS — N186 End stage renal disease: Secondary | ICD-10-CM | POA: Diagnosis not present

## 2021-02-15 DIAGNOSIS — N2581 Secondary hyperparathyroidism of renal origin: Secondary | ICD-10-CM | POA: Diagnosis not present

## 2021-02-18 DIAGNOSIS — D509 Iron deficiency anemia, unspecified: Secondary | ICD-10-CM | POA: Diagnosis not present

## 2021-02-18 DIAGNOSIS — N186 End stage renal disease: Secondary | ICD-10-CM | POA: Diagnosis not present

## 2021-02-18 DIAGNOSIS — N2581 Secondary hyperparathyroidism of renal origin: Secondary | ICD-10-CM | POA: Diagnosis not present

## 2021-02-18 DIAGNOSIS — I82409 Acute embolism and thrombosis of unspecified deep veins of unspecified lower extremity: Secondary | ICD-10-CM | POA: Diagnosis not present

## 2021-02-18 DIAGNOSIS — D631 Anemia in chronic kidney disease: Secondary | ICD-10-CM | POA: Diagnosis not present

## 2021-02-20 DIAGNOSIS — D509 Iron deficiency anemia, unspecified: Secondary | ICD-10-CM | POA: Diagnosis not present

## 2021-02-20 DIAGNOSIS — N186 End stage renal disease: Secondary | ICD-10-CM | POA: Diagnosis not present

## 2021-02-20 DIAGNOSIS — N2581 Secondary hyperparathyroidism of renal origin: Secondary | ICD-10-CM | POA: Diagnosis not present

## 2021-02-20 DIAGNOSIS — D631 Anemia in chronic kidney disease: Secondary | ICD-10-CM | POA: Diagnosis not present

## 2021-02-22 DIAGNOSIS — D631 Anemia in chronic kidney disease: Secondary | ICD-10-CM | POA: Diagnosis not present

## 2021-02-22 DIAGNOSIS — N2581 Secondary hyperparathyroidism of renal origin: Secondary | ICD-10-CM | POA: Diagnosis not present

## 2021-02-22 DIAGNOSIS — N186 End stage renal disease: Secondary | ICD-10-CM | POA: Diagnosis not present

## 2021-02-22 DIAGNOSIS — D509 Iron deficiency anemia, unspecified: Secondary | ICD-10-CM | POA: Diagnosis not present

## 2021-02-25 DIAGNOSIS — I82409 Acute embolism and thrombosis of unspecified deep veins of unspecified lower extremity: Secondary | ICD-10-CM | POA: Diagnosis not present

## 2021-02-25 DIAGNOSIS — D631 Anemia in chronic kidney disease: Secondary | ICD-10-CM | POA: Diagnosis not present

## 2021-02-25 DIAGNOSIS — N2581 Secondary hyperparathyroidism of renal origin: Secondary | ICD-10-CM | POA: Diagnosis not present

## 2021-02-25 DIAGNOSIS — D509 Iron deficiency anemia, unspecified: Secondary | ICD-10-CM | POA: Diagnosis not present

## 2021-02-25 DIAGNOSIS — N186 End stage renal disease: Secondary | ICD-10-CM | POA: Diagnosis not present

## 2021-02-27 DIAGNOSIS — N186 End stage renal disease: Secondary | ICD-10-CM | POA: Diagnosis not present

## 2021-02-27 DIAGNOSIS — D631 Anemia in chronic kidney disease: Secondary | ICD-10-CM | POA: Diagnosis not present

## 2021-02-27 DIAGNOSIS — D509 Iron deficiency anemia, unspecified: Secondary | ICD-10-CM | POA: Diagnosis not present

## 2021-02-27 DIAGNOSIS — N2581 Secondary hyperparathyroidism of renal origin: Secondary | ICD-10-CM | POA: Diagnosis not present

## 2021-03-01 DIAGNOSIS — N2581 Secondary hyperparathyroidism of renal origin: Secondary | ICD-10-CM | POA: Diagnosis not present

## 2021-03-01 DIAGNOSIS — D631 Anemia in chronic kidney disease: Secondary | ICD-10-CM | POA: Diagnosis not present

## 2021-03-01 DIAGNOSIS — D509 Iron deficiency anemia, unspecified: Secondary | ICD-10-CM | POA: Diagnosis not present

## 2021-03-01 DIAGNOSIS — N186 End stage renal disease: Secondary | ICD-10-CM | POA: Diagnosis not present

## 2021-03-04 DIAGNOSIS — D631 Anemia in chronic kidney disease: Secondary | ICD-10-CM | POA: Diagnosis not present

## 2021-03-04 DIAGNOSIS — I82409 Acute embolism and thrombosis of unspecified deep veins of unspecified lower extremity: Secondary | ICD-10-CM | POA: Diagnosis not present

## 2021-03-04 DIAGNOSIS — N2581 Secondary hyperparathyroidism of renal origin: Secondary | ICD-10-CM | POA: Diagnosis not present

## 2021-03-04 DIAGNOSIS — N186 End stage renal disease: Secondary | ICD-10-CM | POA: Diagnosis not present

## 2021-03-04 DIAGNOSIS — D509 Iron deficiency anemia, unspecified: Secondary | ICD-10-CM | POA: Diagnosis not present

## 2021-03-05 DIAGNOSIS — N186 End stage renal disease: Secondary | ICD-10-CM | POA: Diagnosis not present

## 2021-03-05 DIAGNOSIS — Z992 Dependence on renal dialysis: Secondary | ICD-10-CM | POA: Diagnosis not present

## 2021-03-06 DIAGNOSIS — D509 Iron deficiency anemia, unspecified: Secondary | ICD-10-CM | POA: Diagnosis not present

## 2021-03-06 DIAGNOSIS — D631 Anemia in chronic kidney disease: Secondary | ICD-10-CM | POA: Diagnosis not present

## 2021-03-06 DIAGNOSIS — E119 Type 2 diabetes mellitus without complications: Secondary | ICD-10-CM | POA: Diagnosis not present

## 2021-03-06 DIAGNOSIS — N2581 Secondary hyperparathyroidism of renal origin: Secondary | ICD-10-CM | POA: Diagnosis not present

## 2021-03-06 DIAGNOSIS — N186 End stage renal disease: Secondary | ICD-10-CM | POA: Diagnosis not present

## 2021-03-07 ENCOUNTER — Other Ambulatory Visit: Payer: Self-pay

## 2021-03-07 NOTE — Patient Outreach (Signed)
[facility]) Care Management ? ?03/07/2021 ? ?Joy Anderson ?26-Jul-1950 ?[id number] ? ? ?Telephone Assessment ? ? ?Unsuccessful quarterly outreach attempt to patient.  ? ? ? ? ?Plan: ?RN CM will make quarterly outreach attempt within the month of June.  ? ?Enzo Montgomery, RN,BSN,CCM ?[facility] Care Management ?Telephonic Care Management Coordinator ?Direct Phone: [phone] ?Toll Free: [phone] ?Fax: [phone] ? ?

## 2021-03-08 DIAGNOSIS — D509 Iron deficiency anemia, unspecified: Secondary | ICD-10-CM | POA: Diagnosis not present

## 2021-03-08 DIAGNOSIS — D631 Anemia in chronic kidney disease: Secondary | ICD-10-CM | POA: Diagnosis not present

## 2021-03-08 DIAGNOSIS — N2581 Secondary hyperparathyroidism of renal origin: Secondary | ICD-10-CM | POA: Diagnosis not present

## 2021-03-08 DIAGNOSIS — N186 End stage renal disease: Secondary | ICD-10-CM | POA: Diagnosis not present

## 2021-03-11 DIAGNOSIS — N2581 Secondary hyperparathyroidism of renal origin: Secondary | ICD-10-CM | POA: Diagnosis not present

## 2021-03-11 DIAGNOSIS — D631 Anemia in chronic kidney disease: Secondary | ICD-10-CM | POA: Diagnosis not present

## 2021-03-11 DIAGNOSIS — I82409 Acute embolism and thrombosis of unspecified deep veins of unspecified lower extremity: Secondary | ICD-10-CM | POA: Diagnosis not present

## 2021-03-11 DIAGNOSIS — D509 Iron deficiency anemia, unspecified: Secondary | ICD-10-CM | POA: Diagnosis not present

## 2021-03-11 DIAGNOSIS — N186 End stage renal disease: Secondary | ICD-10-CM | POA: Diagnosis not present

## 2021-03-13 DIAGNOSIS — D631 Anemia in chronic kidney disease: Secondary | ICD-10-CM | POA: Diagnosis not present

## 2021-03-13 DIAGNOSIS — N186 End stage renal disease: Secondary | ICD-10-CM | POA: Diagnosis not present

## 2021-03-13 DIAGNOSIS — N2581 Secondary hyperparathyroidism of renal origin: Secondary | ICD-10-CM | POA: Diagnosis not present

## 2021-03-13 DIAGNOSIS — D509 Iron deficiency anemia, unspecified: Secondary | ICD-10-CM | POA: Diagnosis not present

## 2021-03-15 DIAGNOSIS — D509 Iron deficiency anemia, unspecified: Secondary | ICD-10-CM | POA: Diagnosis not present

## 2021-03-15 DIAGNOSIS — N2581 Secondary hyperparathyroidism of renal origin: Secondary | ICD-10-CM | POA: Diagnosis not present

## 2021-03-15 DIAGNOSIS — D631 Anemia in chronic kidney disease: Secondary | ICD-10-CM | POA: Diagnosis not present

## 2021-03-15 DIAGNOSIS — N186 End stage renal disease: Secondary | ICD-10-CM | POA: Diagnosis not present

## 2021-03-18 DIAGNOSIS — N186 End stage renal disease: Secondary | ICD-10-CM | POA: Diagnosis not present

## 2021-03-18 DIAGNOSIS — D509 Iron deficiency anemia, unspecified: Secondary | ICD-10-CM | POA: Diagnosis not present

## 2021-03-18 DIAGNOSIS — D631 Anemia in chronic kidney disease: Secondary | ICD-10-CM | POA: Diagnosis not present

## 2021-03-18 DIAGNOSIS — I82409 Acute embolism and thrombosis of unspecified deep veins of unspecified lower extremity: Secondary | ICD-10-CM | POA: Diagnosis not present

## 2021-03-18 DIAGNOSIS — N2581 Secondary hyperparathyroidism of renal origin: Secondary | ICD-10-CM | POA: Diagnosis not present

## 2021-03-20 DIAGNOSIS — N2581 Secondary hyperparathyroidism of renal origin: Secondary | ICD-10-CM | POA: Diagnosis not present

## 2021-03-20 DIAGNOSIS — D631 Anemia in chronic kidney disease: Secondary | ICD-10-CM | POA: Diagnosis not present

## 2021-03-20 DIAGNOSIS — N186 End stage renal disease: Secondary | ICD-10-CM | POA: Diagnosis not present

## 2021-03-20 DIAGNOSIS — D509 Iron deficiency anemia, unspecified: Secondary | ICD-10-CM | POA: Diagnosis not present

## 2021-03-22 DIAGNOSIS — D509 Iron deficiency anemia, unspecified: Secondary | ICD-10-CM | POA: Diagnosis not present

## 2021-03-22 DIAGNOSIS — D631 Anemia in chronic kidney disease: Secondary | ICD-10-CM | POA: Diagnosis not present

## 2021-03-22 DIAGNOSIS — N2581 Secondary hyperparathyroidism of renal origin: Secondary | ICD-10-CM | POA: Diagnosis not present

## 2021-03-22 DIAGNOSIS — N186 End stage renal disease: Secondary | ICD-10-CM | POA: Diagnosis not present

## 2021-03-25 DIAGNOSIS — D631 Anemia in chronic kidney disease: Secondary | ICD-10-CM | POA: Diagnosis not present

## 2021-03-25 DIAGNOSIS — D509 Iron deficiency anemia, unspecified: Secondary | ICD-10-CM | POA: Diagnosis not present

## 2021-03-25 DIAGNOSIS — N2581 Secondary hyperparathyroidism of renal origin: Secondary | ICD-10-CM | POA: Diagnosis not present

## 2021-03-25 DIAGNOSIS — N186 End stage renal disease: Secondary | ICD-10-CM | POA: Diagnosis not present

## 2021-03-25 DIAGNOSIS — I82409 Acute embolism and thrombosis of unspecified deep veins of unspecified lower extremity: Secondary | ICD-10-CM | POA: Diagnosis not present

## 2021-03-27 DIAGNOSIS — D509 Iron deficiency anemia, unspecified: Secondary | ICD-10-CM | POA: Diagnosis not present

## 2021-03-27 DIAGNOSIS — D631 Anemia in chronic kidney disease: Secondary | ICD-10-CM | POA: Diagnosis not present

## 2021-03-27 DIAGNOSIS — N2581 Secondary hyperparathyroidism of renal origin: Secondary | ICD-10-CM | POA: Diagnosis not present

## 2021-03-27 DIAGNOSIS — N186 End stage renal disease: Secondary | ICD-10-CM | POA: Diagnosis not present

## 2021-03-29 DIAGNOSIS — N2581 Secondary hyperparathyroidism of renal origin: Secondary | ICD-10-CM | POA: Diagnosis not present

## 2021-03-29 DIAGNOSIS — D509 Iron deficiency anemia, unspecified: Secondary | ICD-10-CM | POA: Diagnosis not present

## 2021-03-29 DIAGNOSIS — N186 End stage renal disease: Secondary | ICD-10-CM | POA: Diagnosis not present

## 2021-03-29 DIAGNOSIS — D631 Anemia in chronic kidney disease: Secondary | ICD-10-CM | POA: Diagnosis not present

## 2021-04-01 DIAGNOSIS — N2581 Secondary hyperparathyroidism of renal origin: Secondary | ICD-10-CM | POA: Diagnosis not present

## 2021-04-01 DIAGNOSIS — I82409 Acute embolism and thrombosis of unspecified deep veins of unspecified lower extremity: Secondary | ICD-10-CM | POA: Diagnosis not present

## 2021-04-01 DIAGNOSIS — D509 Iron deficiency anemia, unspecified: Secondary | ICD-10-CM | POA: Diagnosis not present

## 2021-04-01 DIAGNOSIS — N186 End stage renal disease: Secondary | ICD-10-CM | POA: Diagnosis not present

## 2021-04-01 DIAGNOSIS — D631 Anemia in chronic kidney disease: Secondary | ICD-10-CM | POA: Diagnosis not present

## 2021-04-03 DIAGNOSIS — D631 Anemia in chronic kidney disease: Secondary | ICD-10-CM | POA: Diagnosis not present

## 2021-04-03 DIAGNOSIS — D509 Iron deficiency anemia, unspecified: Secondary | ICD-10-CM | POA: Diagnosis not present

## 2021-04-03 DIAGNOSIS — N186 End stage renal disease: Secondary | ICD-10-CM | POA: Diagnosis not present

## 2021-04-03 DIAGNOSIS — N2581 Secondary hyperparathyroidism of renal origin: Secondary | ICD-10-CM | POA: Diagnosis not present

## 2021-04-03 DIAGNOSIS — I82409 Acute embolism and thrombosis of unspecified deep veins of unspecified lower extremity: Secondary | ICD-10-CM | POA: Diagnosis not present

## 2021-04-05 DIAGNOSIS — N2581 Secondary hyperparathyroidism of renal origin: Secondary | ICD-10-CM | POA: Diagnosis not present

## 2021-04-05 DIAGNOSIS — Z992 Dependence on renal dialysis: Secondary | ICD-10-CM | POA: Diagnosis not present

## 2021-04-05 DIAGNOSIS — D631 Anemia in chronic kidney disease: Secondary | ICD-10-CM | POA: Diagnosis not present

## 2021-04-05 DIAGNOSIS — D509 Iron deficiency anemia, unspecified: Secondary | ICD-10-CM | POA: Diagnosis not present

## 2021-04-05 DIAGNOSIS — N186 End stage renal disease: Secondary | ICD-10-CM | POA: Diagnosis not present

## 2021-04-08 DIAGNOSIS — D631 Anemia in chronic kidney disease: Secondary | ICD-10-CM | POA: Diagnosis not present

## 2021-04-08 DIAGNOSIS — N186 End stage renal disease: Secondary | ICD-10-CM | POA: Diagnosis not present

## 2021-04-08 DIAGNOSIS — I82409 Acute embolism and thrombosis of unspecified deep veins of unspecified lower extremity: Secondary | ICD-10-CM | POA: Diagnosis not present

## 2021-04-08 DIAGNOSIS — N2581 Secondary hyperparathyroidism of renal origin: Secondary | ICD-10-CM | POA: Diagnosis not present

## 2021-04-08 DIAGNOSIS — D509 Iron deficiency anemia, unspecified: Secondary | ICD-10-CM | POA: Diagnosis not present

## 2021-04-10 DIAGNOSIS — N2581 Secondary hyperparathyroidism of renal origin: Secondary | ICD-10-CM | POA: Diagnosis not present

## 2021-04-10 DIAGNOSIS — N186 End stage renal disease: Secondary | ICD-10-CM | POA: Diagnosis not present

## 2021-04-10 DIAGNOSIS — D631 Anemia in chronic kidney disease: Secondary | ICD-10-CM | POA: Diagnosis not present

## 2021-04-10 DIAGNOSIS — D509 Iron deficiency anemia, unspecified: Secondary | ICD-10-CM | POA: Diagnosis not present

## 2021-04-10 DIAGNOSIS — E119 Type 2 diabetes mellitus without complications: Secondary | ICD-10-CM | POA: Diagnosis not present

## 2021-04-12 DIAGNOSIS — D631 Anemia in chronic kidney disease: Secondary | ICD-10-CM | POA: Diagnosis not present

## 2021-04-12 DIAGNOSIS — D509 Iron deficiency anemia, unspecified: Secondary | ICD-10-CM | POA: Diagnosis not present

## 2021-04-12 DIAGNOSIS — N2581 Secondary hyperparathyroidism of renal origin: Secondary | ICD-10-CM | POA: Diagnosis not present

## 2021-04-12 DIAGNOSIS — N186 End stage renal disease: Secondary | ICD-10-CM | POA: Diagnosis not present

## 2021-04-15 DIAGNOSIS — I82409 Acute embolism and thrombosis of unspecified deep veins of unspecified lower extremity: Secondary | ICD-10-CM | POA: Diagnosis not present

## 2021-04-15 DIAGNOSIS — D631 Anemia in chronic kidney disease: Secondary | ICD-10-CM | POA: Diagnosis not present

## 2021-04-15 DIAGNOSIS — N186 End stage renal disease: Secondary | ICD-10-CM | POA: Diagnosis not present

## 2021-04-15 DIAGNOSIS — N2581 Secondary hyperparathyroidism of renal origin: Secondary | ICD-10-CM | POA: Diagnosis not present

## 2021-04-15 DIAGNOSIS — D509 Iron deficiency anemia, unspecified: Secondary | ICD-10-CM | POA: Diagnosis not present

## 2021-04-17 DIAGNOSIS — N186 End stage renal disease: Secondary | ICD-10-CM | POA: Diagnosis not present

## 2021-04-17 DIAGNOSIS — D631 Anemia in chronic kidney disease: Secondary | ICD-10-CM | POA: Diagnosis not present

## 2021-04-17 DIAGNOSIS — N2581 Secondary hyperparathyroidism of renal origin: Secondary | ICD-10-CM | POA: Diagnosis not present

## 2021-04-17 DIAGNOSIS — D509 Iron deficiency anemia, unspecified: Secondary | ICD-10-CM | POA: Diagnosis not present

## 2021-04-18 DIAGNOSIS — N186 End stage renal disease: Secondary | ICD-10-CM | POA: Diagnosis not present

## 2021-04-18 DIAGNOSIS — Z95828 Presence of other vascular implants and grafts: Secondary | ICD-10-CM | POA: Diagnosis not present

## 2021-04-18 DIAGNOSIS — T82898A Other specified complication of vascular prosthetic devices, implants and grafts, initial encounter: Secondary | ICD-10-CM | POA: Diagnosis not present

## 2021-04-19 DIAGNOSIS — N186 End stage renal disease: Secondary | ICD-10-CM | POA: Diagnosis not present

## 2021-04-19 DIAGNOSIS — D509 Iron deficiency anemia, unspecified: Secondary | ICD-10-CM | POA: Diagnosis not present

## 2021-04-19 DIAGNOSIS — D631 Anemia in chronic kidney disease: Secondary | ICD-10-CM | POA: Diagnosis not present

## 2021-04-19 DIAGNOSIS — N2581 Secondary hyperparathyroidism of renal origin: Secondary | ICD-10-CM | POA: Diagnosis not present

## 2021-04-22 DIAGNOSIS — N2581 Secondary hyperparathyroidism of renal origin: Secondary | ICD-10-CM | POA: Diagnosis not present

## 2021-04-22 DIAGNOSIS — D631 Anemia in chronic kidney disease: Secondary | ICD-10-CM | POA: Diagnosis not present

## 2021-04-22 DIAGNOSIS — N186 End stage renal disease: Secondary | ICD-10-CM | POA: Diagnosis not present

## 2021-04-22 DIAGNOSIS — D509 Iron deficiency anemia, unspecified: Secondary | ICD-10-CM | POA: Diagnosis not present

## 2021-04-22 DIAGNOSIS — I82409 Acute embolism and thrombosis of unspecified deep veins of unspecified lower extremity: Secondary | ICD-10-CM | POA: Diagnosis not present

## 2021-04-24 DIAGNOSIS — D631 Anemia in chronic kidney disease: Secondary | ICD-10-CM | POA: Diagnosis not present

## 2021-04-24 DIAGNOSIS — N2581 Secondary hyperparathyroidism of renal origin: Secondary | ICD-10-CM | POA: Diagnosis not present

## 2021-04-24 DIAGNOSIS — D509 Iron deficiency anemia, unspecified: Secondary | ICD-10-CM | POA: Diagnosis not present

## 2021-04-24 DIAGNOSIS — N186 End stage renal disease: Secondary | ICD-10-CM | POA: Diagnosis not present

## 2021-04-26 DIAGNOSIS — D509 Iron deficiency anemia, unspecified: Secondary | ICD-10-CM | POA: Diagnosis not present

## 2021-04-26 DIAGNOSIS — N2581 Secondary hyperparathyroidism of renal origin: Secondary | ICD-10-CM | POA: Diagnosis not present

## 2021-04-26 DIAGNOSIS — N186 End stage renal disease: Secondary | ICD-10-CM | POA: Diagnosis not present

## 2021-04-26 DIAGNOSIS — D631 Anemia in chronic kidney disease: Secondary | ICD-10-CM | POA: Diagnosis not present

## 2021-04-29 DIAGNOSIS — N2581 Secondary hyperparathyroidism of renal origin: Secondary | ICD-10-CM | POA: Diagnosis not present

## 2021-04-29 DIAGNOSIS — N186 End stage renal disease: Secondary | ICD-10-CM | POA: Diagnosis not present

## 2021-04-29 DIAGNOSIS — I82409 Acute embolism and thrombosis of unspecified deep veins of unspecified lower extremity: Secondary | ICD-10-CM | POA: Diagnosis not present

## 2021-04-29 DIAGNOSIS — D509 Iron deficiency anemia, unspecified: Secondary | ICD-10-CM | POA: Diagnosis not present

## 2021-04-29 DIAGNOSIS — D631 Anemia in chronic kidney disease: Secondary | ICD-10-CM | POA: Diagnosis not present

## 2021-05-01 DIAGNOSIS — N186 End stage renal disease: Secondary | ICD-10-CM | POA: Diagnosis not present

## 2021-05-01 DIAGNOSIS — N2581 Secondary hyperparathyroidism of renal origin: Secondary | ICD-10-CM | POA: Diagnosis not present

## 2021-05-01 DIAGNOSIS — D631 Anemia in chronic kidney disease: Secondary | ICD-10-CM | POA: Diagnosis not present

## 2021-05-01 DIAGNOSIS — D509 Iron deficiency anemia, unspecified: Secondary | ICD-10-CM | POA: Diagnosis not present

## 2021-05-03 DIAGNOSIS — D631 Anemia in chronic kidney disease: Secondary | ICD-10-CM | POA: Diagnosis not present

## 2021-05-03 DIAGNOSIS — N186 End stage renal disease: Secondary | ICD-10-CM | POA: Diagnosis not present

## 2021-05-03 DIAGNOSIS — D509 Iron deficiency anemia, unspecified: Secondary | ICD-10-CM | POA: Diagnosis not present

## 2021-05-03 DIAGNOSIS — N2581 Secondary hyperparathyroidism of renal origin: Secondary | ICD-10-CM | POA: Diagnosis not present

## 2021-05-05 DIAGNOSIS — Z992 Dependence on renal dialysis: Secondary | ICD-10-CM | POA: Diagnosis not present

## 2021-05-05 DIAGNOSIS — N186 End stage renal disease: Secondary | ICD-10-CM | POA: Diagnosis not present

## 2021-05-06 DIAGNOSIS — D631 Anemia in chronic kidney disease: Secondary | ICD-10-CM | POA: Diagnosis not present

## 2021-05-06 DIAGNOSIS — N2581 Secondary hyperparathyroidism of renal origin: Secondary | ICD-10-CM | POA: Diagnosis not present

## 2021-05-06 DIAGNOSIS — D509 Iron deficiency anemia, unspecified: Secondary | ICD-10-CM | POA: Diagnosis not present

## 2021-05-06 DIAGNOSIS — N186 End stage renal disease: Secondary | ICD-10-CM | POA: Diagnosis not present

## 2021-05-06 DIAGNOSIS — I82409 Acute embolism and thrombosis of unspecified deep veins of unspecified lower extremity: Secondary | ICD-10-CM | POA: Diagnosis not present

## 2021-05-08 DIAGNOSIS — D509 Iron deficiency anemia, unspecified: Secondary | ICD-10-CM | POA: Diagnosis not present

## 2021-05-08 DIAGNOSIS — N186 End stage renal disease: Secondary | ICD-10-CM | POA: Diagnosis not present

## 2021-05-08 DIAGNOSIS — D631 Anemia in chronic kidney disease: Secondary | ICD-10-CM | POA: Diagnosis not present

## 2021-05-08 DIAGNOSIS — E119 Type 2 diabetes mellitus without complications: Secondary | ICD-10-CM | POA: Diagnosis not present

## 2021-05-08 DIAGNOSIS — N2581 Secondary hyperparathyroidism of renal origin: Secondary | ICD-10-CM | POA: Diagnosis not present

## 2021-05-10 DIAGNOSIS — D509 Iron deficiency anemia, unspecified: Secondary | ICD-10-CM | POA: Diagnosis not present

## 2021-05-10 DIAGNOSIS — N2581 Secondary hyperparathyroidism of renal origin: Secondary | ICD-10-CM | POA: Diagnosis not present

## 2021-05-10 DIAGNOSIS — N186 End stage renal disease: Secondary | ICD-10-CM | POA: Diagnosis not present

## 2021-05-10 DIAGNOSIS — D631 Anemia in chronic kidney disease: Secondary | ICD-10-CM | POA: Diagnosis not present

## 2021-05-13 DIAGNOSIS — N186 End stage renal disease: Secondary | ICD-10-CM | POA: Diagnosis not present

## 2021-05-13 DIAGNOSIS — D509 Iron deficiency anemia, unspecified: Secondary | ICD-10-CM | POA: Diagnosis not present

## 2021-05-13 DIAGNOSIS — D631 Anemia in chronic kidney disease: Secondary | ICD-10-CM | POA: Diagnosis not present

## 2021-05-13 DIAGNOSIS — I82409 Acute embolism and thrombosis of unspecified deep veins of unspecified lower extremity: Secondary | ICD-10-CM | POA: Diagnosis not present

## 2021-05-13 DIAGNOSIS — N2581 Secondary hyperparathyroidism of renal origin: Secondary | ICD-10-CM | POA: Diagnosis not present

## 2021-05-15 DIAGNOSIS — D509 Iron deficiency anemia, unspecified: Secondary | ICD-10-CM | POA: Diagnosis not present

## 2021-05-15 DIAGNOSIS — N2581 Secondary hyperparathyroidism of renal origin: Secondary | ICD-10-CM | POA: Diagnosis not present

## 2021-05-15 DIAGNOSIS — D631 Anemia in chronic kidney disease: Secondary | ICD-10-CM | POA: Diagnosis not present

## 2021-05-15 DIAGNOSIS — N186 End stage renal disease: Secondary | ICD-10-CM | POA: Diagnosis not present

## 2021-05-17 DIAGNOSIS — D509 Iron deficiency anemia, unspecified: Secondary | ICD-10-CM | POA: Diagnosis not present

## 2021-05-17 DIAGNOSIS — N2581 Secondary hyperparathyroidism of renal origin: Secondary | ICD-10-CM | POA: Diagnosis not present

## 2021-05-17 DIAGNOSIS — D631 Anemia in chronic kidney disease: Secondary | ICD-10-CM | POA: Diagnosis not present

## 2021-05-17 DIAGNOSIS — N186 End stage renal disease: Secondary | ICD-10-CM | POA: Diagnosis not present

## 2021-05-20 DIAGNOSIS — I82409 Acute embolism and thrombosis of unspecified deep veins of unspecified lower extremity: Secondary | ICD-10-CM | POA: Diagnosis not present

## 2021-05-20 DIAGNOSIS — N186 End stage renal disease: Secondary | ICD-10-CM | POA: Diagnosis not present

## 2021-05-20 DIAGNOSIS — D631 Anemia in chronic kidney disease: Secondary | ICD-10-CM | POA: Diagnosis not present

## 2021-05-20 DIAGNOSIS — D509 Iron deficiency anemia, unspecified: Secondary | ICD-10-CM | POA: Diagnosis not present

## 2021-05-20 DIAGNOSIS — N2581 Secondary hyperparathyroidism of renal origin: Secondary | ICD-10-CM | POA: Diagnosis not present

## 2021-05-22 DIAGNOSIS — D631 Anemia in chronic kidney disease: Secondary | ICD-10-CM | POA: Diagnosis not present

## 2021-05-22 DIAGNOSIS — N186 End stage renal disease: Secondary | ICD-10-CM | POA: Diagnosis not present

## 2021-05-22 DIAGNOSIS — N2581 Secondary hyperparathyroidism of renal origin: Secondary | ICD-10-CM | POA: Diagnosis not present

## 2021-05-22 DIAGNOSIS — D509 Iron deficiency anemia, unspecified: Secondary | ICD-10-CM | POA: Diagnosis not present

## 2021-05-24 DIAGNOSIS — D631 Anemia in chronic kidney disease: Secondary | ICD-10-CM | POA: Diagnosis not present

## 2021-05-24 DIAGNOSIS — N186 End stage renal disease: Secondary | ICD-10-CM | POA: Diagnosis not present

## 2021-05-24 DIAGNOSIS — D509 Iron deficiency anemia, unspecified: Secondary | ICD-10-CM | POA: Diagnosis not present

## 2021-05-24 DIAGNOSIS — N2581 Secondary hyperparathyroidism of renal origin: Secondary | ICD-10-CM | POA: Diagnosis not present

## 2021-05-27 DIAGNOSIS — I82409 Acute embolism and thrombosis of unspecified deep veins of unspecified lower extremity: Secondary | ICD-10-CM | POA: Diagnosis not present

## 2021-05-27 DIAGNOSIS — D631 Anemia in chronic kidney disease: Secondary | ICD-10-CM | POA: Diagnosis not present

## 2021-05-27 DIAGNOSIS — N2581 Secondary hyperparathyroidism of renal origin: Secondary | ICD-10-CM | POA: Diagnosis not present

## 2021-05-27 DIAGNOSIS — N186 End stage renal disease: Secondary | ICD-10-CM | POA: Diagnosis not present

## 2021-05-27 DIAGNOSIS — D509 Iron deficiency anemia, unspecified: Secondary | ICD-10-CM | POA: Diagnosis not present

## 2021-05-29 DIAGNOSIS — N2581 Secondary hyperparathyroidism of renal origin: Secondary | ICD-10-CM | POA: Diagnosis not present

## 2021-05-29 DIAGNOSIS — D509 Iron deficiency anemia, unspecified: Secondary | ICD-10-CM | POA: Diagnosis not present

## 2021-05-29 DIAGNOSIS — N186 End stage renal disease: Secondary | ICD-10-CM | POA: Diagnosis not present

## 2021-05-29 DIAGNOSIS — D631 Anemia in chronic kidney disease: Secondary | ICD-10-CM | POA: Diagnosis not present

## 2021-05-31 DIAGNOSIS — N2581 Secondary hyperparathyroidism of renal origin: Secondary | ICD-10-CM | POA: Diagnosis not present

## 2021-05-31 DIAGNOSIS — N186 End stage renal disease: Secondary | ICD-10-CM | POA: Diagnosis not present

## 2021-05-31 DIAGNOSIS — D631 Anemia in chronic kidney disease: Secondary | ICD-10-CM | POA: Diagnosis not present

## 2021-05-31 DIAGNOSIS — D509 Iron deficiency anemia, unspecified: Secondary | ICD-10-CM | POA: Diagnosis not present

## 2021-06-03 DIAGNOSIS — D509 Iron deficiency anemia, unspecified: Secondary | ICD-10-CM | POA: Diagnosis not present

## 2021-06-03 DIAGNOSIS — I82409 Acute embolism and thrombosis of unspecified deep veins of unspecified lower extremity: Secondary | ICD-10-CM | POA: Diagnosis not present

## 2021-06-03 DIAGNOSIS — D631 Anemia in chronic kidney disease: Secondary | ICD-10-CM | POA: Diagnosis not present

## 2021-06-03 DIAGNOSIS — N2581 Secondary hyperparathyroidism of renal origin: Secondary | ICD-10-CM | POA: Diagnosis not present

## 2021-06-03 DIAGNOSIS — N186 End stage renal disease: Secondary | ICD-10-CM | POA: Diagnosis not present

## 2021-06-05 DIAGNOSIS — N186 End stage renal disease: Secondary | ICD-10-CM | POA: Diagnosis not present

## 2021-06-05 DIAGNOSIS — N2581 Secondary hyperparathyroidism of renal origin: Secondary | ICD-10-CM | POA: Diagnosis not present

## 2021-06-05 DIAGNOSIS — D631 Anemia in chronic kidney disease: Secondary | ICD-10-CM | POA: Diagnosis not present

## 2021-06-05 DIAGNOSIS — D509 Iron deficiency anemia, unspecified: Secondary | ICD-10-CM | POA: Diagnosis not present

## 2021-06-05 DIAGNOSIS — Z992 Dependence on renal dialysis: Secondary | ICD-10-CM | POA: Diagnosis not present

## 2021-06-07 DIAGNOSIS — N2581 Secondary hyperparathyroidism of renal origin: Secondary | ICD-10-CM | POA: Diagnosis not present

## 2021-06-07 DIAGNOSIS — D631 Anemia in chronic kidney disease: Secondary | ICD-10-CM | POA: Diagnosis not present

## 2021-06-07 DIAGNOSIS — D509 Iron deficiency anemia, unspecified: Secondary | ICD-10-CM | POA: Diagnosis not present

## 2021-06-07 DIAGNOSIS — N186 End stage renal disease: Secondary | ICD-10-CM | POA: Diagnosis not present

## 2021-06-10 DIAGNOSIS — D631 Anemia in chronic kidney disease: Secondary | ICD-10-CM | POA: Diagnosis not present

## 2021-06-10 DIAGNOSIS — I82409 Acute embolism and thrombosis of unspecified deep veins of unspecified lower extremity: Secondary | ICD-10-CM | POA: Diagnosis not present

## 2021-06-10 DIAGNOSIS — N186 End stage renal disease: Secondary | ICD-10-CM | POA: Diagnosis not present

## 2021-06-10 DIAGNOSIS — N2581 Secondary hyperparathyroidism of renal origin: Secondary | ICD-10-CM | POA: Diagnosis not present

## 2021-06-10 DIAGNOSIS — D509 Iron deficiency anemia, unspecified: Secondary | ICD-10-CM | POA: Diagnosis not present

## 2021-06-11 ENCOUNTER — Other Ambulatory Visit: Payer: Self-pay

## 2021-06-11 NOTE — Patient Outreach (Signed)
[facility]) Care Management  06/11/2021  Joy Anderson 02/10/1950 [id number]   Case Closure   Multiple attempts to establish contact with patient without success.    Plan: RN CM will close case.   Enzo Montgomery, RN,BSN,CCM [place] Management Telephonic Care Management Coordinator Direct Phone: [phone] Toll Free: [phone] Fax: [phone]

## 2021-06-12 DIAGNOSIS — N186 End stage renal disease: Secondary | ICD-10-CM | POA: Diagnosis not present

## 2021-06-12 DIAGNOSIS — E119 Type 2 diabetes mellitus without complications: Secondary | ICD-10-CM | POA: Diagnosis not present

## 2021-06-12 DIAGNOSIS — D509 Iron deficiency anemia, unspecified: Secondary | ICD-10-CM | POA: Diagnosis not present

## 2021-06-12 DIAGNOSIS — N2581 Secondary hyperparathyroidism of renal origin: Secondary | ICD-10-CM | POA: Diagnosis not present

## 2021-06-12 DIAGNOSIS — D631 Anemia in chronic kidney disease: Secondary | ICD-10-CM | POA: Diagnosis not present

## 2021-06-14 DIAGNOSIS — D631 Anemia in chronic kidney disease: Secondary | ICD-10-CM | POA: Diagnosis not present

## 2021-06-14 DIAGNOSIS — N186 End stage renal disease: Secondary | ICD-10-CM | POA: Diagnosis not present

## 2021-06-14 DIAGNOSIS — D509 Iron deficiency anemia, unspecified: Secondary | ICD-10-CM | POA: Diagnosis not present

## 2021-06-14 DIAGNOSIS — N2581 Secondary hyperparathyroidism of renal origin: Secondary | ICD-10-CM | POA: Diagnosis not present

## 2021-06-17 DIAGNOSIS — D509 Iron deficiency anemia, unspecified: Secondary | ICD-10-CM | POA: Diagnosis not present

## 2021-06-17 DIAGNOSIS — N186 End stage renal disease: Secondary | ICD-10-CM | POA: Diagnosis not present

## 2021-06-17 DIAGNOSIS — N2581 Secondary hyperparathyroidism of renal origin: Secondary | ICD-10-CM | POA: Diagnosis not present

## 2021-06-17 DIAGNOSIS — I82409 Acute embolism and thrombosis of unspecified deep veins of unspecified lower extremity: Secondary | ICD-10-CM | POA: Diagnosis not present

## 2021-06-17 DIAGNOSIS — D631 Anemia in chronic kidney disease: Secondary | ICD-10-CM | POA: Diagnosis not present

## 2021-06-19 DIAGNOSIS — D631 Anemia in chronic kidney disease: Secondary | ICD-10-CM | POA: Diagnosis not present

## 2021-06-19 DIAGNOSIS — D509 Iron deficiency anemia, unspecified: Secondary | ICD-10-CM | POA: Diagnosis not present

## 2021-06-19 DIAGNOSIS — N2581 Secondary hyperparathyroidism of renal origin: Secondary | ICD-10-CM | POA: Diagnosis not present

## 2021-06-19 DIAGNOSIS — N186 End stage renal disease: Secondary | ICD-10-CM | POA: Diagnosis not present

## 2021-06-21 DIAGNOSIS — N186 End stage renal disease: Secondary | ICD-10-CM | POA: Diagnosis not present

## 2021-06-21 DIAGNOSIS — D509 Iron deficiency anemia, unspecified: Secondary | ICD-10-CM | POA: Diagnosis not present

## 2021-06-21 DIAGNOSIS — D631 Anemia in chronic kidney disease: Secondary | ICD-10-CM | POA: Diagnosis not present

## 2021-06-21 DIAGNOSIS — N2581 Secondary hyperparathyroidism of renal origin: Secondary | ICD-10-CM | POA: Diagnosis not present

## 2021-06-24 DIAGNOSIS — D631 Anemia in chronic kidney disease: Secondary | ICD-10-CM | POA: Diagnosis not present

## 2021-06-24 DIAGNOSIS — N186 End stage renal disease: Secondary | ICD-10-CM | POA: Diagnosis not present

## 2021-06-24 DIAGNOSIS — D509 Iron deficiency anemia, unspecified: Secondary | ICD-10-CM | POA: Diagnosis not present

## 2021-06-24 DIAGNOSIS — N2581 Secondary hyperparathyroidism of renal origin: Secondary | ICD-10-CM | POA: Diagnosis not present

## 2021-06-24 DIAGNOSIS — I82409 Acute embolism and thrombosis of unspecified deep veins of unspecified lower extremity: Secondary | ICD-10-CM | POA: Diagnosis not present

## 2021-06-26 DIAGNOSIS — N2581 Secondary hyperparathyroidism of renal origin: Secondary | ICD-10-CM | POA: Diagnosis not present

## 2021-06-26 DIAGNOSIS — N186 End stage renal disease: Secondary | ICD-10-CM | POA: Diagnosis not present

## 2021-06-26 DIAGNOSIS — D509 Iron deficiency anemia, unspecified: Secondary | ICD-10-CM | POA: Diagnosis not present

## 2021-06-26 DIAGNOSIS — D631 Anemia in chronic kidney disease: Secondary | ICD-10-CM | POA: Diagnosis not present

## 2021-06-28 DIAGNOSIS — N186 End stage renal disease: Secondary | ICD-10-CM | POA: Diagnosis not present

## 2021-06-28 DIAGNOSIS — D631 Anemia in chronic kidney disease: Secondary | ICD-10-CM | POA: Diagnosis not present

## 2021-06-28 DIAGNOSIS — N2581 Secondary hyperparathyroidism of renal origin: Secondary | ICD-10-CM | POA: Diagnosis not present

## 2021-06-28 DIAGNOSIS — D509 Iron deficiency anemia, unspecified: Secondary | ICD-10-CM | POA: Diagnosis not present

## 2021-07-01 DIAGNOSIS — I82409 Acute embolism and thrombosis of unspecified deep veins of unspecified lower extremity: Secondary | ICD-10-CM | POA: Diagnosis not present

## 2021-07-01 DIAGNOSIS — N186 End stage renal disease: Secondary | ICD-10-CM | POA: Diagnosis not present

## 2021-07-01 DIAGNOSIS — N2581 Secondary hyperparathyroidism of renal origin: Secondary | ICD-10-CM | POA: Diagnosis not present

## 2021-07-01 DIAGNOSIS — D509 Iron deficiency anemia, unspecified: Secondary | ICD-10-CM | POA: Diagnosis not present

## 2021-07-01 DIAGNOSIS — D631 Anemia in chronic kidney disease: Secondary | ICD-10-CM | POA: Diagnosis not present

## 2021-07-02 ENCOUNTER — Other Ambulatory Visit: Payer: Self-pay | Admitting: Family Medicine

## 2021-07-02 DIAGNOSIS — I10 Essential (primary) hypertension: Secondary | ICD-10-CM

## 2021-07-03 DIAGNOSIS — N2581 Secondary hyperparathyroidism of renal origin: Secondary | ICD-10-CM | POA: Diagnosis not present

## 2021-07-03 DIAGNOSIS — D509 Iron deficiency anemia, unspecified: Secondary | ICD-10-CM | POA: Diagnosis not present

## 2021-07-03 DIAGNOSIS — N186 End stage renal disease: Secondary | ICD-10-CM | POA: Diagnosis not present

## 2021-07-03 DIAGNOSIS — D631 Anemia in chronic kidney disease: Secondary | ICD-10-CM | POA: Diagnosis not present

## 2021-07-05 DIAGNOSIS — D509 Iron deficiency anemia, unspecified: Secondary | ICD-10-CM | POA: Diagnosis not present

## 2021-07-05 DIAGNOSIS — Z992 Dependence on renal dialysis: Secondary | ICD-10-CM | POA: Diagnosis not present

## 2021-07-05 DIAGNOSIS — D631 Anemia in chronic kidney disease: Secondary | ICD-10-CM | POA: Diagnosis not present

## 2021-07-05 DIAGNOSIS — N186 End stage renal disease: Secondary | ICD-10-CM | POA: Diagnosis not present

## 2021-07-05 DIAGNOSIS — N2581 Secondary hyperparathyroidism of renal origin: Secondary | ICD-10-CM | POA: Diagnosis not present

## 2021-07-08 DIAGNOSIS — D631 Anemia in chronic kidney disease: Secondary | ICD-10-CM | POA: Diagnosis not present

## 2021-07-08 DIAGNOSIS — N2581 Secondary hyperparathyroidism of renal origin: Secondary | ICD-10-CM | POA: Diagnosis not present

## 2021-07-08 DIAGNOSIS — D509 Iron deficiency anemia, unspecified: Secondary | ICD-10-CM | POA: Diagnosis not present

## 2021-07-08 DIAGNOSIS — N186 End stage renal disease: Secondary | ICD-10-CM | POA: Diagnosis not present

## 2021-07-08 DIAGNOSIS — I82409 Acute embolism and thrombosis of unspecified deep veins of unspecified lower extremity: Secondary | ICD-10-CM | POA: Diagnosis not present

## 2021-07-10 DIAGNOSIS — E119 Type 2 diabetes mellitus without complications: Secondary | ICD-10-CM | POA: Diagnosis not present

## 2021-07-10 DIAGNOSIS — D509 Iron deficiency anemia, unspecified: Secondary | ICD-10-CM | POA: Diagnosis not present

## 2021-07-10 DIAGNOSIS — N2581 Secondary hyperparathyroidism of renal origin: Secondary | ICD-10-CM | POA: Diagnosis not present

## 2021-07-10 DIAGNOSIS — D631 Anemia in chronic kidney disease: Secondary | ICD-10-CM | POA: Diagnosis not present

## 2021-07-10 DIAGNOSIS — N186 End stage renal disease: Secondary | ICD-10-CM | POA: Diagnosis not present

## 2021-07-12 DIAGNOSIS — D509 Iron deficiency anemia, unspecified: Secondary | ICD-10-CM | POA: Diagnosis not present

## 2021-07-12 DIAGNOSIS — N2581 Secondary hyperparathyroidism of renal origin: Secondary | ICD-10-CM | POA: Diagnosis not present

## 2021-07-12 DIAGNOSIS — N186 End stage renal disease: Secondary | ICD-10-CM | POA: Diagnosis not present

## 2021-07-12 DIAGNOSIS — D631 Anemia in chronic kidney disease: Secondary | ICD-10-CM | POA: Diagnosis not present

## 2021-07-15 DIAGNOSIS — N2581 Secondary hyperparathyroidism of renal origin: Secondary | ICD-10-CM | POA: Diagnosis not present

## 2021-07-15 DIAGNOSIS — D631 Anemia in chronic kidney disease: Secondary | ICD-10-CM | POA: Diagnosis not present

## 2021-07-15 DIAGNOSIS — I82409 Acute embolism and thrombosis of unspecified deep veins of unspecified lower extremity: Secondary | ICD-10-CM | POA: Diagnosis not present

## 2021-07-15 DIAGNOSIS — N186 End stage renal disease: Secondary | ICD-10-CM | POA: Diagnosis not present

## 2021-07-15 DIAGNOSIS — D509 Iron deficiency anemia, unspecified: Secondary | ICD-10-CM | POA: Diagnosis not present

## 2021-07-17 DIAGNOSIS — N2581 Secondary hyperparathyroidism of renal origin: Secondary | ICD-10-CM | POA: Diagnosis not present

## 2021-07-17 DIAGNOSIS — N186 End stage renal disease: Secondary | ICD-10-CM | POA: Diagnosis not present

## 2021-07-17 DIAGNOSIS — D631 Anemia in chronic kidney disease: Secondary | ICD-10-CM | POA: Diagnosis not present

## 2021-07-17 DIAGNOSIS — D509 Iron deficiency anemia, unspecified: Secondary | ICD-10-CM | POA: Diagnosis not present

## 2021-07-19 DIAGNOSIS — N2581 Secondary hyperparathyroidism of renal origin: Secondary | ICD-10-CM | POA: Diagnosis not present

## 2021-07-19 DIAGNOSIS — N186 End stage renal disease: Secondary | ICD-10-CM | POA: Diagnosis not present

## 2021-07-19 DIAGNOSIS — D509 Iron deficiency anemia, unspecified: Secondary | ICD-10-CM | POA: Diagnosis not present

## 2021-07-19 DIAGNOSIS — D631 Anemia in chronic kidney disease: Secondary | ICD-10-CM | POA: Diagnosis not present

## 2021-07-22 DIAGNOSIS — D631 Anemia in chronic kidney disease: Secondary | ICD-10-CM | POA: Diagnosis not present

## 2021-07-22 DIAGNOSIS — N2581 Secondary hyperparathyroidism of renal origin: Secondary | ICD-10-CM | POA: Diagnosis not present

## 2021-07-22 DIAGNOSIS — N186 End stage renal disease: Secondary | ICD-10-CM | POA: Diagnosis not present

## 2021-07-22 DIAGNOSIS — I82409 Acute embolism and thrombosis of unspecified deep veins of unspecified lower extremity: Secondary | ICD-10-CM | POA: Diagnosis not present

## 2021-07-22 DIAGNOSIS — D509 Iron deficiency anemia, unspecified: Secondary | ICD-10-CM | POA: Diagnosis not present

## 2021-07-24 DIAGNOSIS — N186 End stage renal disease: Secondary | ICD-10-CM | POA: Diagnosis not present

## 2021-07-24 DIAGNOSIS — D631 Anemia in chronic kidney disease: Secondary | ICD-10-CM | POA: Diagnosis not present

## 2021-07-24 DIAGNOSIS — N2581 Secondary hyperparathyroidism of renal origin: Secondary | ICD-10-CM | POA: Diagnosis not present

## 2021-07-24 DIAGNOSIS — D509 Iron deficiency anemia, unspecified: Secondary | ICD-10-CM | POA: Diagnosis not present

## 2021-07-26 DIAGNOSIS — N186 End stage renal disease: Secondary | ICD-10-CM | POA: Diagnosis not present

## 2021-07-26 DIAGNOSIS — N2581 Secondary hyperparathyroidism of renal origin: Secondary | ICD-10-CM | POA: Diagnosis not present

## 2021-07-26 DIAGNOSIS — D631 Anemia in chronic kidney disease: Secondary | ICD-10-CM | POA: Diagnosis not present

## 2021-07-26 DIAGNOSIS — D509 Iron deficiency anemia, unspecified: Secondary | ICD-10-CM | POA: Diagnosis not present

## 2021-07-29 DIAGNOSIS — D509 Iron deficiency anemia, unspecified: Secondary | ICD-10-CM | POA: Diagnosis not present

## 2021-07-29 DIAGNOSIS — I82409 Acute embolism and thrombosis of unspecified deep veins of unspecified lower extremity: Secondary | ICD-10-CM | POA: Diagnosis not present

## 2021-07-29 DIAGNOSIS — N2581 Secondary hyperparathyroidism of renal origin: Secondary | ICD-10-CM | POA: Diagnosis not present

## 2021-07-29 DIAGNOSIS — N186 End stage renal disease: Secondary | ICD-10-CM | POA: Diagnosis not present

## 2021-07-29 DIAGNOSIS — D631 Anemia in chronic kidney disease: Secondary | ICD-10-CM | POA: Diagnosis not present

## 2021-07-31 DIAGNOSIS — D631 Anemia in chronic kidney disease: Secondary | ICD-10-CM | POA: Diagnosis not present

## 2021-07-31 DIAGNOSIS — N186 End stage renal disease: Secondary | ICD-10-CM | POA: Diagnosis not present

## 2021-07-31 DIAGNOSIS — D509 Iron deficiency anemia, unspecified: Secondary | ICD-10-CM | POA: Diagnosis not present

## 2021-07-31 DIAGNOSIS — N2581 Secondary hyperparathyroidism of renal origin: Secondary | ICD-10-CM | POA: Diagnosis not present

## 2021-08-02 DIAGNOSIS — D631 Anemia in chronic kidney disease: Secondary | ICD-10-CM | POA: Diagnosis not present

## 2021-08-02 DIAGNOSIS — N2581 Secondary hyperparathyroidism of renal origin: Secondary | ICD-10-CM | POA: Diagnosis not present

## 2021-08-02 DIAGNOSIS — D509 Iron deficiency anemia, unspecified: Secondary | ICD-10-CM | POA: Diagnosis not present

## 2021-08-02 DIAGNOSIS — N186 End stage renal disease: Secondary | ICD-10-CM | POA: Diagnosis not present

## 2021-08-05 DIAGNOSIS — D631 Anemia in chronic kidney disease: Secondary | ICD-10-CM | POA: Diagnosis not present

## 2021-08-05 DIAGNOSIS — N2581 Secondary hyperparathyroidism of renal origin: Secondary | ICD-10-CM | POA: Diagnosis not present

## 2021-08-05 DIAGNOSIS — D509 Iron deficiency anemia, unspecified: Secondary | ICD-10-CM | POA: Diagnosis not present

## 2021-08-05 DIAGNOSIS — Z992 Dependence on renal dialysis: Secondary | ICD-10-CM | POA: Diagnosis not present

## 2021-08-05 DIAGNOSIS — N186 End stage renal disease: Secondary | ICD-10-CM | POA: Diagnosis not present

## 2021-08-05 DIAGNOSIS — I82409 Acute embolism and thrombosis of unspecified deep veins of unspecified lower extremity: Secondary | ICD-10-CM | POA: Diagnosis not present

## 2021-08-07 DIAGNOSIS — N2581 Secondary hyperparathyroidism of renal origin: Secondary | ICD-10-CM | POA: Diagnosis not present

## 2021-08-07 DIAGNOSIS — E119 Type 2 diabetes mellitus without complications: Secondary | ICD-10-CM | POA: Diagnosis not present

## 2021-08-07 DIAGNOSIS — N186 End stage renal disease: Secondary | ICD-10-CM | POA: Diagnosis not present

## 2021-08-07 DIAGNOSIS — D631 Anemia in chronic kidney disease: Secondary | ICD-10-CM | POA: Diagnosis not present

## 2021-08-07 DIAGNOSIS — D509 Iron deficiency anemia, unspecified: Secondary | ICD-10-CM | POA: Diagnosis not present

## 2021-08-09 DIAGNOSIS — N186 End stage renal disease: Secondary | ICD-10-CM | POA: Diagnosis not present

## 2021-08-09 DIAGNOSIS — N2581 Secondary hyperparathyroidism of renal origin: Secondary | ICD-10-CM | POA: Diagnosis not present

## 2021-08-09 DIAGNOSIS — D631 Anemia in chronic kidney disease: Secondary | ICD-10-CM | POA: Diagnosis not present

## 2021-08-09 DIAGNOSIS — D509 Iron deficiency anemia, unspecified: Secondary | ICD-10-CM | POA: Diagnosis not present

## 2021-08-12 DIAGNOSIS — N186 End stage renal disease: Secondary | ICD-10-CM | POA: Diagnosis not present

## 2021-08-12 DIAGNOSIS — N2581 Secondary hyperparathyroidism of renal origin: Secondary | ICD-10-CM | POA: Diagnosis not present

## 2021-08-12 DIAGNOSIS — D631 Anemia in chronic kidney disease: Secondary | ICD-10-CM | POA: Diagnosis not present

## 2021-08-12 DIAGNOSIS — I82409 Acute embolism and thrombosis of unspecified deep veins of unspecified lower extremity: Secondary | ICD-10-CM | POA: Diagnosis not present

## 2021-08-12 DIAGNOSIS — D509 Iron deficiency anemia, unspecified: Secondary | ICD-10-CM | POA: Diagnosis not present

## 2021-08-14 DIAGNOSIS — N186 End stage renal disease: Secondary | ICD-10-CM | POA: Diagnosis not present

## 2021-08-14 DIAGNOSIS — N2581 Secondary hyperparathyroidism of renal origin: Secondary | ICD-10-CM | POA: Diagnosis not present

## 2021-08-14 DIAGNOSIS — D509 Iron deficiency anemia, unspecified: Secondary | ICD-10-CM | POA: Diagnosis not present

## 2021-08-14 DIAGNOSIS — D631 Anemia in chronic kidney disease: Secondary | ICD-10-CM | POA: Diagnosis not present

## 2021-08-16 DIAGNOSIS — D631 Anemia in chronic kidney disease: Secondary | ICD-10-CM | POA: Diagnosis not present

## 2021-08-16 DIAGNOSIS — D509 Iron deficiency anemia, unspecified: Secondary | ICD-10-CM | POA: Diagnosis not present

## 2021-08-16 DIAGNOSIS — N186 End stage renal disease: Secondary | ICD-10-CM | POA: Diagnosis not present

## 2021-08-16 DIAGNOSIS — N2581 Secondary hyperparathyroidism of renal origin: Secondary | ICD-10-CM | POA: Diagnosis not present

## 2021-08-19 DIAGNOSIS — N186 End stage renal disease: Secondary | ICD-10-CM | POA: Diagnosis not present

## 2021-08-19 DIAGNOSIS — I82409 Acute embolism and thrombosis of unspecified deep veins of unspecified lower extremity: Secondary | ICD-10-CM | POA: Diagnosis not present

## 2021-08-19 DIAGNOSIS — D509 Iron deficiency anemia, unspecified: Secondary | ICD-10-CM | POA: Diagnosis not present

## 2021-08-19 DIAGNOSIS — N2581 Secondary hyperparathyroidism of renal origin: Secondary | ICD-10-CM | POA: Diagnosis not present

## 2021-08-19 DIAGNOSIS — D631 Anemia in chronic kidney disease: Secondary | ICD-10-CM | POA: Diagnosis not present

## 2021-08-21 DIAGNOSIS — N2581 Secondary hyperparathyroidism of renal origin: Secondary | ICD-10-CM | POA: Diagnosis not present

## 2021-08-21 DIAGNOSIS — D509 Iron deficiency anemia, unspecified: Secondary | ICD-10-CM | POA: Diagnosis not present

## 2021-08-21 DIAGNOSIS — N186 End stage renal disease: Secondary | ICD-10-CM | POA: Diagnosis not present

## 2021-08-21 DIAGNOSIS — D631 Anemia in chronic kidney disease: Secondary | ICD-10-CM | POA: Diagnosis not present

## 2021-08-22 DIAGNOSIS — R6889 Other general symptoms and signs: Secondary | ICD-10-CM | POA: Diagnosis not present

## 2021-08-22 DIAGNOSIS — M329 Systemic lupus erythematosus, unspecified: Secondary | ICD-10-CM | POA: Diagnosis not present

## 2021-08-22 DIAGNOSIS — Z79899 Other long term (current) drug therapy: Secondary | ICD-10-CM | POA: Diagnosis not present

## 2021-08-23 DIAGNOSIS — D509 Iron deficiency anemia, unspecified: Secondary | ICD-10-CM | POA: Diagnosis not present

## 2021-08-23 DIAGNOSIS — N186 End stage renal disease: Secondary | ICD-10-CM | POA: Diagnosis not present

## 2021-08-23 DIAGNOSIS — N2581 Secondary hyperparathyroidism of renal origin: Secondary | ICD-10-CM | POA: Diagnosis not present

## 2021-08-23 DIAGNOSIS — D631 Anemia in chronic kidney disease: Secondary | ICD-10-CM | POA: Diagnosis not present

## 2021-08-26 DIAGNOSIS — N186 End stage renal disease: Secondary | ICD-10-CM | POA: Diagnosis not present

## 2021-08-26 DIAGNOSIS — N2581 Secondary hyperparathyroidism of renal origin: Secondary | ICD-10-CM | POA: Diagnosis not present

## 2021-08-26 DIAGNOSIS — D509 Iron deficiency anemia, unspecified: Secondary | ICD-10-CM | POA: Diagnosis not present

## 2021-08-26 DIAGNOSIS — I82409 Acute embolism and thrombosis of unspecified deep veins of unspecified lower extremity: Secondary | ICD-10-CM | POA: Diagnosis not present

## 2021-08-26 DIAGNOSIS — D631 Anemia in chronic kidney disease: Secondary | ICD-10-CM | POA: Diagnosis not present

## 2021-08-28 DIAGNOSIS — N186 End stage renal disease: Secondary | ICD-10-CM | POA: Diagnosis not present

## 2021-08-28 DIAGNOSIS — N2581 Secondary hyperparathyroidism of renal origin: Secondary | ICD-10-CM | POA: Diagnosis not present

## 2021-08-28 DIAGNOSIS — D631 Anemia in chronic kidney disease: Secondary | ICD-10-CM | POA: Diagnosis not present

## 2021-08-28 DIAGNOSIS — D509 Iron deficiency anemia, unspecified: Secondary | ICD-10-CM | POA: Diagnosis not present

## 2021-08-30 DIAGNOSIS — N2581 Secondary hyperparathyroidism of renal origin: Secondary | ICD-10-CM | POA: Diagnosis not present

## 2021-08-30 DIAGNOSIS — N186 End stage renal disease: Secondary | ICD-10-CM | POA: Diagnosis not present

## 2021-08-30 DIAGNOSIS — D631 Anemia in chronic kidney disease: Secondary | ICD-10-CM | POA: Diagnosis not present

## 2021-08-30 DIAGNOSIS — D509 Iron deficiency anemia, unspecified: Secondary | ICD-10-CM | POA: Diagnosis not present

## 2021-09-02 DIAGNOSIS — N186 End stage renal disease: Secondary | ICD-10-CM | POA: Diagnosis not present

## 2021-09-02 DIAGNOSIS — D631 Anemia in chronic kidney disease: Secondary | ICD-10-CM | POA: Diagnosis not present

## 2021-09-02 DIAGNOSIS — N2581 Secondary hyperparathyroidism of renal origin: Secondary | ICD-10-CM | POA: Diagnosis not present

## 2021-09-02 DIAGNOSIS — I82409 Acute embolism and thrombosis of unspecified deep veins of unspecified lower extremity: Secondary | ICD-10-CM | POA: Diagnosis not present

## 2021-09-02 DIAGNOSIS — D509 Iron deficiency anemia, unspecified: Secondary | ICD-10-CM | POA: Diagnosis not present

## 2021-09-04 DIAGNOSIS — D631 Anemia in chronic kidney disease: Secondary | ICD-10-CM | POA: Diagnosis not present

## 2021-09-04 DIAGNOSIS — N186 End stage renal disease: Secondary | ICD-10-CM | POA: Diagnosis not present

## 2021-09-04 DIAGNOSIS — N2581 Secondary hyperparathyroidism of renal origin: Secondary | ICD-10-CM | POA: Diagnosis not present

## 2021-09-04 DIAGNOSIS — D509 Iron deficiency anemia, unspecified: Secondary | ICD-10-CM | POA: Diagnosis not present

## 2021-09-05 DIAGNOSIS — Z992 Dependence on renal dialysis: Secondary | ICD-10-CM | POA: Diagnosis not present

## 2021-09-05 DIAGNOSIS — N186 End stage renal disease: Secondary | ICD-10-CM | POA: Diagnosis not present

## 2021-09-06 DIAGNOSIS — N2581 Secondary hyperparathyroidism of renal origin: Secondary | ICD-10-CM | POA: Diagnosis not present

## 2021-09-06 DIAGNOSIS — D509 Iron deficiency anemia, unspecified: Secondary | ICD-10-CM | POA: Diagnosis not present

## 2021-09-06 DIAGNOSIS — N186 End stage renal disease: Secondary | ICD-10-CM | POA: Diagnosis not present

## 2021-09-06 DIAGNOSIS — D631 Anemia in chronic kidney disease: Secondary | ICD-10-CM | POA: Diagnosis not present

## 2021-09-06 DIAGNOSIS — Z23 Encounter for immunization: Secondary | ICD-10-CM | POA: Diagnosis not present

## 2021-09-09 DIAGNOSIS — D509 Iron deficiency anemia, unspecified: Secondary | ICD-10-CM | POA: Diagnosis not present

## 2021-09-09 DIAGNOSIS — D631 Anemia in chronic kidney disease: Secondary | ICD-10-CM | POA: Diagnosis not present

## 2021-09-09 DIAGNOSIS — N2581 Secondary hyperparathyroidism of renal origin: Secondary | ICD-10-CM | POA: Diagnosis not present

## 2021-09-09 DIAGNOSIS — Z23 Encounter for immunization: Secondary | ICD-10-CM | POA: Diagnosis not present

## 2021-09-09 DIAGNOSIS — I82409 Acute embolism and thrombosis of unspecified deep veins of unspecified lower extremity: Secondary | ICD-10-CM | POA: Diagnosis not present

## 2021-09-09 DIAGNOSIS — N186 End stage renal disease: Secondary | ICD-10-CM | POA: Diagnosis not present

## 2021-09-11 DIAGNOSIS — D509 Iron deficiency anemia, unspecified: Secondary | ICD-10-CM | POA: Diagnosis not present

## 2021-09-11 DIAGNOSIS — D631 Anemia in chronic kidney disease: Secondary | ICD-10-CM | POA: Diagnosis not present

## 2021-09-11 DIAGNOSIS — N2581 Secondary hyperparathyroidism of renal origin: Secondary | ICD-10-CM | POA: Diagnosis not present

## 2021-09-11 DIAGNOSIS — E119 Type 2 diabetes mellitus without complications: Secondary | ICD-10-CM | POA: Diagnosis not present

## 2021-09-11 DIAGNOSIS — N186 End stage renal disease: Secondary | ICD-10-CM | POA: Diagnosis not present

## 2021-09-11 DIAGNOSIS — Z23 Encounter for immunization: Secondary | ICD-10-CM | POA: Diagnosis not present

## 2021-09-12 DIAGNOSIS — R6889 Other general symptoms and signs: Secondary | ICD-10-CM | POA: Diagnosis not present

## 2021-09-12 DIAGNOSIS — E1122 Type 2 diabetes mellitus with diabetic chronic kidney disease: Secondary | ICD-10-CM | POA: Diagnosis not present

## 2021-09-13 DIAGNOSIS — D509 Iron deficiency anemia, unspecified: Secondary | ICD-10-CM | POA: Diagnosis not present

## 2021-09-13 DIAGNOSIS — N186 End stage renal disease: Secondary | ICD-10-CM | POA: Diagnosis not present

## 2021-09-13 DIAGNOSIS — Z23 Encounter for immunization: Secondary | ICD-10-CM | POA: Diagnosis not present

## 2021-09-13 DIAGNOSIS — N2581 Secondary hyperparathyroidism of renal origin: Secondary | ICD-10-CM | POA: Diagnosis not present

## 2021-09-13 DIAGNOSIS — D631 Anemia in chronic kidney disease: Secondary | ICD-10-CM | POA: Diagnosis not present

## 2021-09-16 DIAGNOSIS — D509 Iron deficiency anemia, unspecified: Secondary | ICD-10-CM | POA: Diagnosis not present

## 2021-09-16 DIAGNOSIS — N2581 Secondary hyperparathyroidism of renal origin: Secondary | ICD-10-CM | POA: Diagnosis not present

## 2021-09-16 DIAGNOSIS — Z23 Encounter for immunization: Secondary | ICD-10-CM | POA: Diagnosis not present

## 2021-09-16 DIAGNOSIS — N186 End stage renal disease: Secondary | ICD-10-CM | POA: Diagnosis not present

## 2021-09-16 DIAGNOSIS — D631 Anemia in chronic kidney disease: Secondary | ICD-10-CM | POA: Diagnosis not present

## 2021-09-16 DIAGNOSIS — I82409 Acute embolism and thrombosis of unspecified deep veins of unspecified lower extremity: Secondary | ICD-10-CM | POA: Diagnosis not present

## 2021-09-18 DIAGNOSIS — Z23 Encounter for immunization: Secondary | ICD-10-CM | POA: Diagnosis not present

## 2021-09-18 DIAGNOSIS — D631 Anemia in chronic kidney disease: Secondary | ICD-10-CM | POA: Diagnosis not present

## 2021-09-18 DIAGNOSIS — N186 End stage renal disease: Secondary | ICD-10-CM | POA: Diagnosis not present

## 2021-09-18 DIAGNOSIS — N2581 Secondary hyperparathyroidism of renal origin: Secondary | ICD-10-CM | POA: Diagnosis not present

## 2021-09-18 DIAGNOSIS — D509 Iron deficiency anemia, unspecified: Secondary | ICD-10-CM | POA: Diagnosis not present

## 2021-09-20 DIAGNOSIS — D509 Iron deficiency anemia, unspecified: Secondary | ICD-10-CM | POA: Diagnosis not present

## 2021-09-20 DIAGNOSIS — N2581 Secondary hyperparathyroidism of renal origin: Secondary | ICD-10-CM | POA: Diagnosis not present

## 2021-09-20 DIAGNOSIS — N186 End stage renal disease: Secondary | ICD-10-CM | POA: Diagnosis not present

## 2021-09-20 DIAGNOSIS — Z23 Encounter for immunization: Secondary | ICD-10-CM | POA: Diagnosis not present

## 2021-09-20 DIAGNOSIS — D631 Anemia in chronic kidney disease: Secondary | ICD-10-CM | POA: Diagnosis not present

## 2021-09-23 DIAGNOSIS — N186 End stage renal disease: Secondary | ICD-10-CM | POA: Diagnosis not present

## 2021-09-23 DIAGNOSIS — D509 Iron deficiency anemia, unspecified: Secondary | ICD-10-CM | POA: Diagnosis not present

## 2021-09-23 DIAGNOSIS — N2581 Secondary hyperparathyroidism of renal origin: Secondary | ICD-10-CM | POA: Diagnosis not present

## 2021-09-23 DIAGNOSIS — Z23 Encounter for immunization: Secondary | ICD-10-CM | POA: Diagnosis not present

## 2021-09-23 DIAGNOSIS — I82409 Acute embolism and thrombosis of unspecified deep veins of unspecified lower extremity: Secondary | ICD-10-CM | POA: Diagnosis not present

## 2021-09-23 DIAGNOSIS — D631 Anemia in chronic kidney disease: Secondary | ICD-10-CM | POA: Diagnosis not present

## 2021-09-25 DIAGNOSIS — D509 Iron deficiency anemia, unspecified: Secondary | ICD-10-CM | POA: Diagnosis not present

## 2021-09-25 DIAGNOSIS — Z23 Encounter for immunization: Secondary | ICD-10-CM | POA: Diagnosis not present

## 2021-09-25 DIAGNOSIS — N2581 Secondary hyperparathyroidism of renal origin: Secondary | ICD-10-CM | POA: Diagnosis not present

## 2021-09-25 DIAGNOSIS — N186 End stage renal disease: Secondary | ICD-10-CM | POA: Diagnosis not present

## 2021-09-25 DIAGNOSIS — D631 Anemia in chronic kidney disease: Secondary | ICD-10-CM | POA: Diagnosis not present

## 2021-09-27 DIAGNOSIS — Z23 Encounter for immunization: Secondary | ICD-10-CM | POA: Diagnosis not present

## 2021-09-27 DIAGNOSIS — D509 Iron deficiency anemia, unspecified: Secondary | ICD-10-CM | POA: Diagnosis not present

## 2021-09-27 DIAGNOSIS — D631 Anemia in chronic kidney disease: Secondary | ICD-10-CM | POA: Diagnosis not present

## 2021-09-27 DIAGNOSIS — N186 End stage renal disease: Secondary | ICD-10-CM | POA: Diagnosis not present

## 2021-09-27 DIAGNOSIS — N2581 Secondary hyperparathyroidism of renal origin: Secondary | ICD-10-CM | POA: Diagnosis not present

## 2021-09-30 DIAGNOSIS — Z23 Encounter for immunization: Secondary | ICD-10-CM | POA: Diagnosis not present

## 2021-09-30 DIAGNOSIS — N2581 Secondary hyperparathyroidism of renal origin: Secondary | ICD-10-CM | POA: Diagnosis not present

## 2021-09-30 DIAGNOSIS — D509 Iron deficiency anemia, unspecified: Secondary | ICD-10-CM | POA: Diagnosis not present

## 2021-09-30 DIAGNOSIS — I82409 Acute embolism and thrombosis of unspecified deep veins of unspecified lower extremity: Secondary | ICD-10-CM | POA: Diagnosis not present

## 2021-09-30 DIAGNOSIS — N186 End stage renal disease: Secondary | ICD-10-CM | POA: Diagnosis not present

## 2021-09-30 DIAGNOSIS — D631 Anemia in chronic kidney disease: Secondary | ICD-10-CM | POA: Diagnosis not present

## 2021-10-02 DIAGNOSIS — N2581 Secondary hyperparathyroidism of renal origin: Secondary | ICD-10-CM | POA: Diagnosis not present

## 2021-10-02 DIAGNOSIS — N186 End stage renal disease: Secondary | ICD-10-CM | POA: Diagnosis not present

## 2021-10-02 DIAGNOSIS — D509 Iron deficiency anemia, unspecified: Secondary | ICD-10-CM | POA: Diagnosis not present

## 2021-10-02 DIAGNOSIS — D631 Anemia in chronic kidney disease: Secondary | ICD-10-CM | POA: Diagnosis not present

## 2021-10-02 DIAGNOSIS — Z23 Encounter for immunization: Secondary | ICD-10-CM | POA: Diagnosis not present

## 2021-10-04 DIAGNOSIS — Z23 Encounter for immunization: Secondary | ICD-10-CM | POA: Diagnosis not present

## 2021-10-04 DIAGNOSIS — N186 End stage renal disease: Secondary | ICD-10-CM | POA: Diagnosis not present

## 2021-10-04 DIAGNOSIS — N2581 Secondary hyperparathyroidism of renal origin: Secondary | ICD-10-CM | POA: Diagnosis not present

## 2021-10-04 DIAGNOSIS — D631 Anemia in chronic kidney disease: Secondary | ICD-10-CM | POA: Diagnosis not present

## 2021-10-04 DIAGNOSIS — D509 Iron deficiency anemia, unspecified: Secondary | ICD-10-CM | POA: Diagnosis not present

## 2021-10-05 DIAGNOSIS — Z992 Dependence on renal dialysis: Secondary | ICD-10-CM | POA: Diagnosis not present

## 2021-10-05 DIAGNOSIS — N186 End stage renal disease: Secondary | ICD-10-CM | POA: Diagnosis not present

## 2021-10-07 DIAGNOSIS — Z23 Encounter for immunization: Secondary | ICD-10-CM | POA: Diagnosis not present

## 2021-10-07 DIAGNOSIS — D509 Iron deficiency anemia, unspecified: Secondary | ICD-10-CM | POA: Diagnosis not present

## 2021-10-07 DIAGNOSIS — D631 Anemia in chronic kidney disease: Secondary | ICD-10-CM | POA: Diagnosis not present

## 2021-10-07 DIAGNOSIS — N2581 Secondary hyperparathyroidism of renal origin: Secondary | ICD-10-CM | POA: Diagnosis not present

## 2021-10-07 DIAGNOSIS — N186 End stage renal disease: Secondary | ICD-10-CM | POA: Diagnosis not present

## 2021-10-07 DIAGNOSIS — I82409 Acute embolism and thrombosis of unspecified deep veins of unspecified lower extremity: Secondary | ICD-10-CM | POA: Diagnosis not present

## 2021-10-09 DIAGNOSIS — D509 Iron deficiency anemia, unspecified: Secondary | ICD-10-CM | POA: Diagnosis not present

## 2021-10-09 DIAGNOSIS — N2581 Secondary hyperparathyroidism of renal origin: Secondary | ICD-10-CM | POA: Diagnosis not present

## 2021-10-09 DIAGNOSIS — N186 End stage renal disease: Secondary | ICD-10-CM | POA: Diagnosis not present

## 2021-10-09 DIAGNOSIS — D631 Anemia in chronic kidney disease: Secondary | ICD-10-CM | POA: Diagnosis not present

## 2021-10-09 DIAGNOSIS — Z23 Encounter for immunization: Secondary | ICD-10-CM | POA: Diagnosis not present

## 2021-10-09 DIAGNOSIS — E119 Type 2 diabetes mellitus without complications: Secondary | ICD-10-CM | POA: Diagnosis not present

## 2021-10-11 DIAGNOSIS — Z23 Encounter for immunization: Secondary | ICD-10-CM | POA: Diagnosis not present

## 2021-10-11 DIAGNOSIS — D631 Anemia in chronic kidney disease: Secondary | ICD-10-CM | POA: Diagnosis not present

## 2021-10-11 DIAGNOSIS — D509 Iron deficiency anemia, unspecified: Secondary | ICD-10-CM | POA: Diagnosis not present

## 2021-10-11 DIAGNOSIS — N2581 Secondary hyperparathyroidism of renal origin: Secondary | ICD-10-CM | POA: Diagnosis not present

## 2021-10-11 DIAGNOSIS — N186 End stage renal disease: Secondary | ICD-10-CM | POA: Diagnosis not present

## 2021-10-14 DIAGNOSIS — Z23 Encounter for immunization: Secondary | ICD-10-CM | POA: Diagnosis not present

## 2021-10-14 DIAGNOSIS — D509 Iron deficiency anemia, unspecified: Secondary | ICD-10-CM | POA: Diagnosis not present

## 2021-10-14 DIAGNOSIS — I82409 Acute embolism and thrombosis of unspecified deep veins of unspecified lower extremity: Secondary | ICD-10-CM | POA: Diagnosis not present

## 2021-10-14 DIAGNOSIS — N2581 Secondary hyperparathyroidism of renal origin: Secondary | ICD-10-CM | POA: Diagnosis not present

## 2021-10-14 DIAGNOSIS — N186 End stage renal disease: Secondary | ICD-10-CM | POA: Diagnosis not present

## 2021-10-14 DIAGNOSIS — D631 Anemia in chronic kidney disease: Secondary | ICD-10-CM | POA: Diagnosis not present

## 2021-10-16 DIAGNOSIS — D631 Anemia in chronic kidney disease: Secondary | ICD-10-CM | POA: Diagnosis not present

## 2021-10-16 DIAGNOSIS — N186 End stage renal disease: Secondary | ICD-10-CM | POA: Diagnosis not present

## 2021-10-16 DIAGNOSIS — D509 Iron deficiency anemia, unspecified: Secondary | ICD-10-CM | POA: Diagnosis not present

## 2021-10-16 DIAGNOSIS — N2581 Secondary hyperparathyroidism of renal origin: Secondary | ICD-10-CM | POA: Diagnosis not present

## 2021-10-16 DIAGNOSIS — Z23 Encounter for immunization: Secondary | ICD-10-CM | POA: Diagnosis not present

## 2021-10-18 DIAGNOSIS — D509 Iron deficiency anemia, unspecified: Secondary | ICD-10-CM | POA: Diagnosis not present

## 2021-10-18 DIAGNOSIS — D631 Anemia in chronic kidney disease: Secondary | ICD-10-CM | POA: Diagnosis not present

## 2021-10-18 DIAGNOSIS — N186 End stage renal disease: Secondary | ICD-10-CM | POA: Diagnosis not present

## 2021-10-18 DIAGNOSIS — N2581 Secondary hyperparathyroidism of renal origin: Secondary | ICD-10-CM | POA: Diagnosis not present

## 2021-10-18 DIAGNOSIS — Z23 Encounter for immunization: Secondary | ICD-10-CM | POA: Diagnosis not present

## 2021-10-21 DIAGNOSIS — N2581 Secondary hyperparathyroidism of renal origin: Secondary | ICD-10-CM | POA: Diagnosis not present

## 2021-10-21 DIAGNOSIS — N186 End stage renal disease: Secondary | ICD-10-CM | POA: Diagnosis not present

## 2021-10-21 DIAGNOSIS — D631 Anemia in chronic kidney disease: Secondary | ICD-10-CM | POA: Diagnosis not present

## 2021-10-21 DIAGNOSIS — D509 Iron deficiency anemia, unspecified: Secondary | ICD-10-CM | POA: Diagnosis not present

## 2021-10-21 DIAGNOSIS — Z23 Encounter for immunization: Secondary | ICD-10-CM | POA: Diagnosis not present

## 2021-10-21 DIAGNOSIS — I82409 Acute embolism and thrombosis of unspecified deep veins of unspecified lower extremity: Secondary | ICD-10-CM | POA: Diagnosis not present

## 2021-10-23 DIAGNOSIS — N186 End stage renal disease: Secondary | ICD-10-CM | POA: Diagnosis not present

## 2021-10-23 DIAGNOSIS — D631 Anemia in chronic kidney disease: Secondary | ICD-10-CM | POA: Diagnosis not present

## 2021-10-23 DIAGNOSIS — D509 Iron deficiency anemia, unspecified: Secondary | ICD-10-CM | POA: Diagnosis not present

## 2021-10-23 DIAGNOSIS — Z23 Encounter for immunization: Secondary | ICD-10-CM | POA: Diagnosis not present

## 2021-10-23 DIAGNOSIS — N2581 Secondary hyperparathyroidism of renal origin: Secondary | ICD-10-CM | POA: Diagnosis not present

## 2021-10-24 DIAGNOSIS — K644 Residual hemorrhoidal skin tags: Secondary | ICD-10-CM | POA: Diagnosis not present

## 2021-10-24 DIAGNOSIS — I120 Hypertensive chronic kidney disease with stage 5 chronic kidney disease or end stage renal disease: Secondary | ICD-10-CM | POA: Diagnosis not present

## 2021-10-24 DIAGNOSIS — M329 Systemic lupus erythematosus, unspecified: Secondary | ICD-10-CM | POA: Diagnosis not present

## 2021-10-24 DIAGNOSIS — M79661 Pain in right lower leg: Secondary | ICD-10-CM | POA: Diagnosis not present

## 2021-10-24 DIAGNOSIS — M79604 Pain in right leg: Secondary | ICD-10-CM | POA: Diagnosis not present

## 2021-10-24 DIAGNOSIS — Z86718 Personal history of other venous thrombosis and embolism: Secondary | ICD-10-CM | POA: Diagnosis not present

## 2021-10-24 DIAGNOSIS — Z992 Dependence on renal dialysis: Secondary | ICD-10-CM | POA: Diagnosis not present

## 2021-10-24 DIAGNOSIS — Z7901 Long term (current) use of anticoagulants: Secondary | ICD-10-CM | POA: Diagnosis not present

## 2021-10-24 DIAGNOSIS — K648 Other hemorrhoids: Secondary | ICD-10-CM | POA: Diagnosis not present

## 2021-10-24 DIAGNOSIS — M1612 Unilateral primary osteoarthritis, left hip: Secondary | ICD-10-CM | POA: Diagnosis not present

## 2021-10-24 DIAGNOSIS — N186 End stage renal disease: Secondary | ICD-10-CM | POA: Diagnosis not present

## 2021-10-25 DIAGNOSIS — N186 End stage renal disease: Secondary | ICD-10-CM | POA: Diagnosis not present

## 2021-10-25 DIAGNOSIS — D631 Anemia in chronic kidney disease: Secondary | ICD-10-CM | POA: Diagnosis not present

## 2021-10-25 DIAGNOSIS — D509 Iron deficiency anemia, unspecified: Secondary | ICD-10-CM | POA: Diagnosis not present

## 2021-10-25 DIAGNOSIS — N2581 Secondary hyperparathyroidism of renal origin: Secondary | ICD-10-CM | POA: Diagnosis not present

## 2021-10-25 DIAGNOSIS — Z23 Encounter for immunization: Secondary | ICD-10-CM | POA: Diagnosis not present

## 2021-10-28 DIAGNOSIS — I82409 Acute embolism and thrombosis of unspecified deep veins of unspecified lower extremity: Secondary | ICD-10-CM | POA: Diagnosis not present

## 2021-10-28 DIAGNOSIS — Z23 Encounter for immunization: Secondary | ICD-10-CM | POA: Diagnosis not present

## 2021-10-28 DIAGNOSIS — D509 Iron deficiency anemia, unspecified: Secondary | ICD-10-CM | POA: Diagnosis not present

## 2021-10-28 DIAGNOSIS — D631 Anemia in chronic kidney disease: Secondary | ICD-10-CM | POA: Diagnosis not present

## 2021-10-28 DIAGNOSIS — N186 End stage renal disease: Secondary | ICD-10-CM | POA: Diagnosis not present

## 2021-10-28 DIAGNOSIS — N2581 Secondary hyperparathyroidism of renal origin: Secondary | ICD-10-CM | POA: Diagnosis not present

## 2021-10-30 DIAGNOSIS — D509 Iron deficiency anemia, unspecified: Secondary | ICD-10-CM | POA: Diagnosis not present

## 2021-10-30 DIAGNOSIS — N186 End stage renal disease: Secondary | ICD-10-CM | POA: Diagnosis not present

## 2021-10-30 DIAGNOSIS — N2581 Secondary hyperparathyroidism of renal origin: Secondary | ICD-10-CM | POA: Diagnosis not present

## 2021-10-30 DIAGNOSIS — Z23 Encounter for immunization: Secondary | ICD-10-CM | POA: Diagnosis not present

## 2021-10-30 DIAGNOSIS — D631 Anemia in chronic kidney disease: Secondary | ICD-10-CM | POA: Diagnosis not present

## 2021-10-31 DIAGNOSIS — K6289 Other specified diseases of anus and rectum: Secondary | ICD-10-CM | POA: Diagnosis not present

## 2021-10-31 DIAGNOSIS — K648 Other hemorrhoids: Secondary | ICD-10-CM | POA: Diagnosis not present

## 2021-11-01 ENCOUNTER — Ambulatory Visit (INDEPENDENT_AMBULATORY_CARE_PROVIDER_SITE_OTHER): Payer: Medicare HMO | Admitting: Physician Assistant

## 2021-11-01 DIAGNOSIS — D631 Anemia in chronic kidney disease: Secondary | ICD-10-CM | POA: Diagnosis not present

## 2021-11-01 DIAGNOSIS — D509 Iron deficiency anemia, unspecified: Secondary | ICD-10-CM | POA: Diagnosis not present

## 2021-11-01 DIAGNOSIS — N186 End stage renal disease: Secondary | ICD-10-CM | POA: Diagnosis not present

## 2021-11-01 DIAGNOSIS — Z0000 Encounter for general adult medical examination without abnormal findings: Secondary | ICD-10-CM

## 2021-11-01 DIAGNOSIS — N2581 Secondary hyperparathyroidism of renal origin: Secondary | ICD-10-CM | POA: Diagnosis not present

## 2021-11-01 DIAGNOSIS — Z23 Encounter for immunization: Secondary | ICD-10-CM | POA: Diagnosis not present

## 2021-11-01 NOTE — Patient Instructions (Signed)
[place] Maintenance Summary and Written Plan of Care  [name] ,  Thank you for allowing me to perform your Medicare Annual Wellness Visit and for your ongoing commitment to your health.   Health Maintenance & Immunization History Health Maintenance  Topic Date Due   Zoster Vaccines- Shingrix (1 of 2) 02/01/2022 (Originally 03/30/1969)   COVID-19 Vaccine (4 - Moderna risk series) 12/16/2021   COLONOSCOPY (Pts 45-21yr Insurance coverage will need to be confirmed)  06/29/2022   MAMMOGRAM  10/12/2022   Medicare Annual Wellness (AWV)  11/02/2022   DEXA SCAN  11/15/2022   TETANUS/TDAP  09/06/2024   Pneumonia Vaccine 71 Years old  Completed   INFLUENZA VACCINE  Completed   Hepatitis C Screening  Completed   HPV VACCINES  Aged Out   Immunization History  Administered Date(s) Administered   Fluad Quad(high Dose 65+) 10/28/2019   Hepatitis B 03/04/2019   Hepatitis B, adult 10/25/2014, 11/24/2014, 05/14/2015   Influenza Whole 12/12/2009   Influenza, High Dose Seasonal PF 10/06/2016, 10/25/2018, 10/25/2018, 10/12/2020   Influenza,inj,Quad PF,6+ Mos 10/14/2012, 11/01/2013   Influenza-Unspecified 10/25/2014, 10/01/2021   Moderna Covid-19 Vaccine Bivalent Booster 132yr& up 10/21/2021   Moderna Sars-Covid-2 Vaccination 02/18/2019, 03/18/2019   PPD Test 01/01/2020   Pneumococcal Conjugate-13 04/25/2014   Pneumococcal Polysaccharide-23 12/12/2009, 12/20/2014   Tdap 09/06/2004, 09/07/2014    These are the patient goals that we discussed:  Goals Addressed               This Visit's Progress     Patient Stated (pt-stated)        Patient stated she wants to continue exercising.         This is a list of Health Maintenance Items that are overdue or due now: Screening mammography Shingles vaccine    Orders/Referrals Placed Today: No orders of the defined types were placed in this encounter.  (Contact our referral department at [phone] you have not spoken with someone about your referral appointment within the next 5 days)    Follow-up Plan Follow-up with MeHali MarryMD as planned Patient will schedule shingles vaccine at the pharmacy.  She will call to get her mammogram scheduled, did not want me to send referral. Medicare wellness visit in one year.       Health Maintenance, Female Adopting a healthy lifestyle and getting preventive care are important in promoting health and wellness. Ask your health care provider about: The right schedule for you to have regular tests and exams. Things you can do on your own to prevent diseases and keep yourself healthy. What should I know about diet, weight, and exercise? Eat a healthy diet  Eat a diet that includes plenty of vegetables, fruits, low-fat dairy products, and lean protein. Do not eat a lot of foods that are high in solid fats, added sugars, or sodium. Maintain a healthy weight Body mass index (BMI) is used to identify weight problems. It estimates body fat based on height and weight. Your health care provider can help determine your BMI and help you achieve or maintain a healthy weight. Get regular exercise Get regular exercise. This is one of the most important things you can do for your health. Most adults should: Exercise for at least 150 minutes each week. The exercise should increase your heart rate and make you sweat (moderate-intensity exercise). Do strengthening exercises at least twice a week. This is in addition to the moderate-intensity exercise. Spend less time  sitting. Even light physical activity can be beneficial. Watch cholesterol and blood lipids Have your blood tested for lipids and cholesterol at 72 years of age, then have this test every 5 years. Have your cholesterol levels checked more often if: Your lipid or cholesterol levels are high. You are older than 72 years of age. You are at high risk for heart disease. What should I  know about cancer screening? Depending on your health history and family history, you may need to have cancer screening at various ages. This may include screening for: Breast cancer. Cervical cancer. Colorectal cancer. Skin cancer. Lung cancer. What should I know about heart disease, diabetes, and high blood pressure? Blood pressure and heart disease High blood pressure causes heart disease and increases the risk of stroke. This is more likely to develop in people who have high blood pressure readings or are overweight. Have your blood pressure checked: Every 3-5 years if you are 52-47 years of age. Every year if you are 23 years old or older. Diabetes Have regular diabetes screenings. This checks your fasting blood sugar level. Have the screening done: Once every three years after age 59 if you are at a normal weight and have a low risk for diabetes. More often and at a younger age if you are overweight or have a high risk for diabetes. What should I know about preventing infection? Hepatitis B If you have a higher risk for hepatitis B, you should be screened for this virus. Talk with your health care provider to find out if you are at risk for hepatitis B infection. Hepatitis C Testing is recommended for: Everyone born from [birth year] through 1966. Anyone with known risk factors for hepatitis C. Sexually transmitted infections (STIs) Get screened for STIs, including gonorrhea and chlamydia, if: You are sexually active and are younger than 72 years of age. You are older than 72 years of age and your health care provider tells you that you are at risk for this type of infection. Your sexual activity has changed since you were last screened, and you are at increased risk for chlamydia or gonorrhea. Ask your health care provider if you are at risk. Ask your health care provider about whether you are at high risk for HIV. Your health care provider may recommend a prescription medicine to help  prevent HIV infection. If you choose to take medicine to prevent HIV, you should first get tested for HIV. You should then be tested every 3 months for as long as you are taking the medicine. Pregnancy If you are about to stop having your period (premenopausal) and you may become pregnant, seek counseling before you get pregnant. Take 400 to 800 micrograms (mcg) of folic acid every day if you become pregnant. Ask for birth control (contraception) if you want to prevent pregnancy. Osteoporosis and menopause Osteoporosis is a disease in which the bones lose minerals and strength with aging. This can result in bone fractures. If you are 66 years old or older, or if you are at risk for osteoporosis and fractures, ask your health care provider if you should: Be screened for bone loss. Take a calcium or vitamin D supplement to lower your risk of fractures. Be given hormone replacement therapy (HRT) to treat symptoms of menopause. Follow these instructions at home: Alcohol use Do not drink alcohol if: Your health care provider tells you not to drink. You are pregnant, may be pregnant, or are planning to become pregnant. If you drink alcohol: Limit  how much you have to: 0-1 drink a day. Know how much alcohol is in your drink. In the [place] one drink equals one 12 oz bottle of beer (355 mL), one 5 oz glass of wine (148 mL), or one 1 oz glass of hard liquor (44 mL). Lifestyle Do not use any products that contain nicotine or tobacco. These products include cigarettes, chewing tobacco, and vaping devices, such as e-cigarettes. If you need help quitting, ask your health care provider. Do not use street drugs. Do not share needles. Ask your health care provider for help if you need support or information about quitting drugs. General instructions Schedule regular health, dental, and eye exams. Stay current with your vaccines. Tell your health care provider if: You often feel depressed. You have ever  been abused or do not feel safe at home. Summary Adopting a healthy lifestyle and getting preventive care are important in promoting health and wellness. Follow your health care provider's instructions about healthy diet, exercising, and getting tested or screened for diseases. Follow your health care provider's instructions on monitoring your cholesterol and blood pressure. This information is not intended to replace advice given to you by your health care provider. Make sure you discuss any questions you have with your health care provider. Document Revised: 05/14/2020 Document Reviewed: 05/14/2020 Elsevier Patient Education  [place].

## 2021-11-01 NOTE — Progress Notes (Signed)
MEDICARE ANNUAL WELLNESS VISIT  11/01/2021  Telephone Visit Disclaimer This Medicare AWV was conducted by telephone due to national recommendations for restrictions regarding the COVID-19 Pandemic (e.g. social distancing).  I verified, using two identifiers, that I am speaking with Joy Anderson or their authorized healthcare agent. I discussed the limitations, risks, security, and privacy concerns of performing an evaluation and management service by telephone and the potential availability of an in-person appointment in the future. The patient expressed understanding and agreed to proceed.  Location of Patient: Home Location of Provider (nurse):  in the office.  Subjective:    Joy Anderson is a 71 y.o. female patient of Metheney, Rene Kocher, MD who had a Medicare Annual Wellness Visit today via telephone. Joy Anderson is Retired and lives with their daughter. she has 3 children. she reports that she is socially active and does interact with friends/family regularly. she is minimally physically active and enjoys sewing.  Patient Care Team: Hali Marry, MD as PCP - General (Family Medicine) Aretta Nip, MD as Referring Physician (Rheumatology) Katherina Right. Nicole Kindred, MD (Nephrology) Dr. Haynes Bast (Ophthalmology)     11/01/2021    3:10 PM 10/19/2020    4:05 PM 07/21/2019    4:37 PM 11/15/2018    9:46 AM 08/20/2017    1:24 PM 08/19/2016    1:35 PM 05/23/2013   11:19 AM  Advanced Directives  Does Patient Have a Medical Advance Directive? No No Yes No No No Patient does not have advance directive;Patient would like information  Type of Advance Directive   [place] in Chart?   No - copy requested      Would patient like information on creating a medical advance directive? No - Patient declined No - Patient declined  No - Patient declined Yes (MAU/Ambulatory/Procedural Areas - Information given) Yes  (MAU/Ambulatory/Procedural Areas - Information given) Advance directive brochure given (Outpatient ONLY)    Hospital Utilization Over the Past 12 Months: # of hospitalizations or ER visits: 1 # of surgeries: 0  Review of Systems    Patient reports that her overall health is better compared to last year.  History obtained from chart review and the patient  Patient Reported Readings (BP, Pulse, CBG, Weight, etc) none  Pain Assessment Pain : No/denies pain     Current Medications & Allergies (verified) Allergies as of 11/01/2021       Reactions   Ace Inhibitors    REACTION: cough Other reaction(s): Other (See Comments) REACTION: cough   Metformin    Other reaction(s): Other (See Comments) Nausea, decreased appetite Nausea, decreased appetite   Fish Oil Other (See Comments)   Other reaction(s): Other (See Comments)   Metformin And Related Other (See Comments)   Nausea, decreased appetite        Medication List        Accurate as of November 01, 2021  3:21 PM. If you have any questions, ask your nurse or doctor.          acetaminophen 500 MG tablet Commonly known as: TYLENOL Take 500 mg by mouth every 6 (six) hours as needed. Two tablets   AMBULATORY NON FORMULARY MEDICATION Medication Name: Shower chair   amLODipine 2.5 MG tablet Commonly known as: NORVASC Take 1 tablet by mouth in the evening   atorvastatin 20 MG tablet Commonly known as: LIPITOR Take 1 tablet (20 mg total) by mouth at  bedtime. Due for follow up visit   Auryxia 1 GM 210 MG(Fe) tablet Generic drug: ferric citrate Take 420 mg by mouth 3 (three) times daily with meals.   CALCIUM PO Take by mouth.   cinacalcet 30 MG tablet Commonly known as: SENSIPAR Take 2 tablets by mouth daily.   hydrocortisone 2.5 % cream Apply 1 application topically 2 (two) times daily as needed.   hydroxychloroquine 200 MG tablet Commonly known as: PLAQUENIL Take 200 mg by mouth daily. Takes it once a  day.   lansoprazole 30 MG capsule Commonly known as: Prevacid Take 1 capsule (30 mg total) by mouth in the morning. About 20 min before first meal of day   multivitamin Tabs tablet   warfarin 1 MG tablet Commonly known as: COUMADIN   warfarin 4 MG tablet Commonly known as: COUMADIN Take by mouth. Patient takes 4.5 mg daily   warfarin 5 MG tablet Commonly known as: COUMADIN SMARTSIG:1 Tablet(s) By Mouth Every Evening        History (reviewed): Past Medical History:  Diagnosis Date   Antiphospholipid antibody with hypercoagulable state ([facility]) 08/27/2010   DVT (deep venous thrombosis) (HCC)    anti phospholipid antibody + -- Dr Lewanda Rife   Hypertension    OAB (overactive bladder)    off meds   Post-menopausal    Proteinuria    Dr Tressie Ellis   Raynaud's disease    Past Surgical History:  Procedure Laterality Date   AV FISTULA PLACEMENT  01/06/2014   CATARACT EXTRACTION, BILATERAL  01/07/2015   HIP SURGERY Right 10/2018   left one done 10/2019   TOTAL ABDOMINAL HYSTERECTOMY  01/07/1999   for fibroids w/ 1 oophorectomy   Family History  Problem Relation Age of Onset   Alcohol abuse Father    Colon cancer Brother    Prostate cancer Brother    Cerebral palsy Brother    Diabetes Other    Social History   Socioeconomic History   Marital status: Divorced    Spouse name: Not on file   Number of children: 3   Years of education: 12   Highest education level: 12th grade  Occupational History   Occupation: Retired  Tobacco Use   Smoking status: Never   Smokeless tobacco: Never  Substance and Sexual Activity   Alcohol use: Yes    Comment: occasional   Drug use: No   Sexual activity: Not on file  Other Topics Concern   Not on file  Social History Narrative   Lives with her daughter. She enjoys sewing.    Social Determinants of Health   Financial Resource Strain: Low Risk  (11/01/2021)   Overall Financial Resource Strain (CARDIA)    Difficulty of Paying Living  Expenses: Not hard at all  Food Insecurity: No Food Insecurity (11/01/2021)   Hunger Vital Sign    Worried About Running Out of Food in the Last Year: Never true    Ran Out of Food in the Last Year: Never true  Transportation Needs: No Transportation Needs (11/01/2021)   PRAPARE - Hydrologist (Medical): No    Lack of Transportation (Non-Medical): No  Physical Activity: Insufficiently Active (11/01/2021)   Exercise Vital Sign    Days of Exercise per Week: 2 days    Minutes of Exercise per Session: 30 min  Stress: No Stress Concern Present (11/01/2021)   [place]    Feeling of Stress : Not at all  Social Connections: Socially Isolated (11/01/2021)   Social Connection and Isolation Panel [NHANES]    Frequency of Communication with Friends and Family: More than three times a week    Frequency of Social Gatherings with Friends and Family: More than three times a week    Attends Religious Services: Never    Marine scientist or Organizations: No    Attends Archivist Meetings: Never    Marital Status: Divorced    Activities of Daily Living    11/01/2021    3:14 PM  In your present state of health, do you have any difficulty performing the following activities:  Hearing? 0  Vision? 0  Difficulty concentrating or making decisions? 0  Walking or climbing stairs? 1  Comment climbing stairs are difficult  Dressing or bathing? 0  Doing errands, shopping? 0  Preparing Food and eating ? N  Using the Toilet? N  In the past six months, have you accidently leaked urine? N  Do you have problems with loss of bowel control? N  Managing your Medications? N  Managing your Finances? N  Housekeeping or managing your Housekeeping? N    Patient Education/ Literacy How often do you need to have someone help you when you read instructions, pamphlets, or other written materials from your  doctor or pharmacy?: 1 - Never What is the last grade level you completed in school?: 12th grade  Exercise Current Exercise Habits: Home exercise routine, Type of exercise: walking, Time (Minutes): 30, Frequency (Times/Week): 2, Weekly Exercise (Minutes/Week): 60, Intensity: Mild, Exercise limited by: None identified  Diet Patient reports consuming 2 meals a day and 4 snack(s) a day Patient reports that her primary diet is: Regular Patient reports that she does have regular access to food.   Depression Screen    11/01/2021    3:11 PM 02/12/2021   10:41 AM 10/19/2020    4:06 PM 09/28/2020    2:16 PM 01/19/2020    4:08 PM 11/15/2018    9:29 AM 08/20/2017    1:24 PM  PHQ 2/9 Scores  PHQ - 2 Score 0 0 0 0 0 0 0     Fall Risk    11/01/2021    3:11 PM 02/12/2021   10:40 AM 10/19/2020    4:05 PM 09/28/2020    2:15 PM 01/19/2020    4:09 PM  Independence in the past year? 0 1 1 0 0  Number falls in past yr: 0 0 0 0   Injury with Fall? 0 1 1 0   Risk for fall due to : No Fall Risks Other (Comment) History of fall(s) No Fall Risks No Fall Risks  Risk for fall due to: Comment  Fell in bath tub and hurt left knee     Follow up Falls evaluation completed Falls prevention discussed;Falls evaluation completed Falls evaluation completed;Education provided;Falls prevention discussed Falls prevention discussed;Falls evaluation completed      Objective:  Joy Anderson seemed alert and oriented and she participated appropriately during our telephone visit.  Blood Pressure Weight BMI  BP Readings from Last 3 Encounters:  02/12/21 126/65  12/22/20 (!) 168/79  09/28/20 (!) 154/60   Wt Readings from Last 3 Encounters:  02/12/21 137 lb (62.1 kg)  12/22/20 140 lb (63.5 kg)  09/28/20 140 lb (63.5 kg)   BMI Readings from Last 1 Encounters:  02/12/21 25.47 kg/m    *Unable to obtain current vital signs, weight, and BMI due to  telephone visit type  Hearing/Vision  Joy Anderson did not seem to have  difficulty with hearing/understanding during the telephone conversation Reports that she has had a formal eye exam by an eye care professional within the past year Reports that she has not had a formal hearing evaluation within the past year *Unable to fully assess hearing and vision during telephone visit type  Cognitive Function:    11/01/2021    3:16 PM 10/19/2020    4:15 PM 07/21/2019    3:51 PM 08/20/2017    1:27 PM 08/20/2016    8:24 AM  6CIT Screen  What Year? 0 points 0 points 0 points 0 points 0 points  What month? 0 points 0 points 0 points 0 points 0 points  What time? 0 points 0 points 0 points 0 points 0 points  Count back from 20 0 points 0 points 0 points 0 points 0 points  Months in reverse 2 points 2 points 0 points 0 points 0 points  Repeat phrase 0 points 2 points 0 points 2 points 0 points  Total Score 2 points 4 points 0 points 2 points 0 points   (Normal:0-7, Significant for Dysfunction: >8)  Normal Cognitive Function Screening: Yes   Immunization & Health Maintenance Record Immunization History  Administered Date(s) Administered   Fluad Quad(high Dose 65+) 10/28/2019   Hepatitis B 03/04/2019   Hepatitis B, adult 10/25/2014, 11/24/2014, 05/14/2015   Influenza Whole 12/12/2009   Influenza, High Dose Seasonal PF 10/06/2016, 10/25/2018, 10/25/2018, 10/12/2020   Influenza,inj,Quad PF,6+ Mos 10/14/2012, 11/01/2013   Influenza-Unspecified 10/25/2014, 10/01/2021   Moderna Covid-19 Vaccine Bivalent Booster 60yr & up 10/21/2021   Moderna Sars-Covid-2 Vaccination 02/18/2019, 03/18/2019   PPD Test 01/01/2020   Pneumococcal Conjugate-13 04/25/2014   Pneumococcal Polysaccharide-23 12/12/2009, 12/20/2014   Tdap 09/06/2004, 09/07/2014    Health Maintenance  Topic Date Due   Zoster Vaccines- Shingrix (1 of 2) 02/01/2022 (Originally 03/30/1969)   COVID-19 Vaccine (4 - Moderna risk series) 12/16/2021   COLONOSCOPY (Pts 45-485yrInsurance coverage will need to be  confirmed)  06/29/2022   MAMMOGRAM  10/12/2022   Medicare Annual Wellness (AWV)  11/02/2022   DEXA SCAN  11/15/2022   TETANUS/TDAP  09/06/2024   Pneumonia Vaccine 6584Years old  Completed   INFLUENZA VACCINE  Completed   Hepatitis C Screening  Completed   HPV VACCINES  Aged Out       Assessment  This is a routine wellness examination for [facility] Health Maintenance: Due or Overdue There are no preventive care reminders to display for this patient.   AgDarliss Ridgeloes not need a referral for [employer] Companyssistance: Care Management:   no Social Work:    no Prescription Assistance:  no Nutrition/Diabetes Education:  no   Plan:  Personalized Goals  Goals Addressed               This Visit's Progress     Patient Stated (pt-stated)        Patient stated she wants to continue exercising.       Personalized Health Maintenance & Screening Recommendations  Screening mammography Shingles vaccine  Lung Cancer Screening Recommended: no (Low Dose CT Chest recommended if Age 72-80ears, 30 pack-year currently smoking OR have quit w/in past 15 years) Hepatitis C Screening recommended: no HIV Screening recommended: no  Advanced Directives: Written information was not prepared per patient's request.  Referrals & Orders No orders of the defined types were placed in this encounter.  Follow-up Plan Follow-up with Hali Marry, MD as planned Patient will schedule shingles vaccine at the pharmacy.  She will call to get her mammogram scheduled, did not want me to send referral. Medicare wellness visit in one year.     I have personally reviewed and noted the following in the patient's chart:   Medical and social history Use of alcohol, tobacco or illicit drugs  Current medications and supplements Functional ability and status Nutritional status Physical activity Advanced directives List of other physicians Hospitalizations, surgeries, and ER  visits in previous 12 months Vitals Screenings to include cognitive, depression, and falls Referrals and appointments  In addition, I have reviewed and discussed with Joy Anderson certain preventive protocols, quality metrics, and best practice recommendations. A written personalized care plan for preventive services as well as general preventive health recommendations is available and can be mailed to the patient at her request.      Tinnie Gens, RN BSN  11/01/2021

## 2021-11-04 DIAGNOSIS — N186 End stage renal disease: Secondary | ICD-10-CM | POA: Diagnosis not present

## 2021-11-04 DIAGNOSIS — Z23 Encounter for immunization: Secondary | ICD-10-CM | POA: Diagnosis not present

## 2021-11-04 DIAGNOSIS — I82409 Acute embolism and thrombosis of unspecified deep veins of unspecified lower extremity: Secondary | ICD-10-CM | POA: Diagnosis not present

## 2021-11-04 DIAGNOSIS — D631 Anemia in chronic kidney disease: Secondary | ICD-10-CM | POA: Diagnosis not present

## 2021-11-04 DIAGNOSIS — N2581 Secondary hyperparathyroidism of renal origin: Secondary | ICD-10-CM | POA: Diagnosis not present

## 2021-11-04 DIAGNOSIS — D509 Iron deficiency anemia, unspecified: Secondary | ICD-10-CM | POA: Diagnosis not present

## 2021-11-05 DIAGNOSIS — N186 End stage renal disease: Secondary | ICD-10-CM | POA: Diagnosis not present

## 2021-11-05 DIAGNOSIS — Z992 Dependence on renal dialysis: Secondary | ICD-10-CM | POA: Diagnosis not present

## 2021-11-06 DIAGNOSIS — D631 Anemia in chronic kidney disease: Secondary | ICD-10-CM | POA: Diagnosis not present

## 2021-11-06 DIAGNOSIS — E119 Type 2 diabetes mellitus without complications: Secondary | ICD-10-CM | POA: Diagnosis not present

## 2021-11-06 DIAGNOSIS — N186 End stage renal disease: Secondary | ICD-10-CM | POA: Diagnosis not present

## 2021-11-06 DIAGNOSIS — N2581 Secondary hyperparathyroidism of renal origin: Secondary | ICD-10-CM | POA: Diagnosis not present

## 2021-11-06 DIAGNOSIS — D509 Iron deficiency anemia, unspecified: Secondary | ICD-10-CM | POA: Diagnosis not present

## 2021-11-08 DIAGNOSIS — N2581 Secondary hyperparathyroidism of renal origin: Secondary | ICD-10-CM | POA: Diagnosis not present

## 2021-11-08 DIAGNOSIS — D509 Iron deficiency anemia, unspecified: Secondary | ICD-10-CM | POA: Diagnosis not present

## 2021-11-08 DIAGNOSIS — N186 End stage renal disease: Secondary | ICD-10-CM | POA: Diagnosis not present

## 2021-11-08 DIAGNOSIS — D631 Anemia in chronic kidney disease: Secondary | ICD-10-CM | POA: Diagnosis not present

## 2021-11-11 DIAGNOSIS — N186 End stage renal disease: Secondary | ICD-10-CM | POA: Diagnosis not present

## 2021-11-11 DIAGNOSIS — N2581 Secondary hyperparathyroidism of renal origin: Secondary | ICD-10-CM | POA: Diagnosis not present

## 2021-11-11 DIAGNOSIS — D631 Anemia in chronic kidney disease: Secondary | ICD-10-CM | POA: Diagnosis not present

## 2021-11-11 DIAGNOSIS — I82409 Acute embolism and thrombosis of unspecified deep veins of unspecified lower extremity: Secondary | ICD-10-CM | POA: Diagnosis not present

## 2021-11-11 DIAGNOSIS — D509 Iron deficiency anemia, unspecified: Secondary | ICD-10-CM | POA: Diagnosis not present

## 2021-11-13 DIAGNOSIS — D509 Iron deficiency anemia, unspecified: Secondary | ICD-10-CM | POA: Diagnosis not present

## 2021-11-13 DIAGNOSIS — N2581 Secondary hyperparathyroidism of renal origin: Secondary | ICD-10-CM | POA: Diagnosis not present

## 2021-11-13 DIAGNOSIS — D631 Anemia in chronic kidney disease: Secondary | ICD-10-CM | POA: Diagnosis not present

## 2021-11-13 DIAGNOSIS — N186 End stage renal disease: Secondary | ICD-10-CM | POA: Diagnosis not present

## 2021-11-14 DIAGNOSIS — M5441 Lumbago with sciatica, right side: Secondary | ICD-10-CM | POA: Diagnosis not present

## 2021-11-14 DIAGNOSIS — M25552 Pain in left hip: Secondary | ICD-10-CM | POA: Diagnosis not present

## 2021-11-14 DIAGNOSIS — N186 End stage renal disease: Secondary | ICD-10-CM | POA: Diagnosis not present

## 2021-11-14 DIAGNOSIS — M5442 Lumbago with sciatica, left side: Secondary | ICD-10-CM | POA: Diagnosis not present

## 2021-11-14 DIAGNOSIS — M25551 Pain in right hip: Secondary | ICD-10-CM | POA: Diagnosis not present

## 2021-11-14 DIAGNOSIS — E1122 Type 2 diabetes mellitus with diabetic chronic kidney disease: Secondary | ICD-10-CM | POA: Diagnosis not present

## 2021-11-14 DIAGNOSIS — Z96643 Presence of artificial hip joint, bilateral: Secondary | ICD-10-CM | POA: Diagnosis not present

## 2021-11-15 DIAGNOSIS — N186 End stage renal disease: Secondary | ICD-10-CM | POA: Diagnosis not present

## 2021-11-15 DIAGNOSIS — D631 Anemia in chronic kidney disease: Secondary | ICD-10-CM | POA: Diagnosis not present

## 2021-11-15 DIAGNOSIS — N2581 Secondary hyperparathyroidism of renal origin: Secondary | ICD-10-CM | POA: Diagnosis not present

## 2021-11-15 DIAGNOSIS — D509 Iron deficiency anemia, unspecified: Secondary | ICD-10-CM | POA: Diagnosis not present

## 2021-11-18 DIAGNOSIS — N186 End stage renal disease: Secondary | ICD-10-CM | POA: Diagnosis not present

## 2021-11-18 DIAGNOSIS — D631 Anemia in chronic kidney disease: Secondary | ICD-10-CM | POA: Diagnosis not present

## 2021-11-18 DIAGNOSIS — N2581 Secondary hyperparathyroidism of renal origin: Secondary | ICD-10-CM | POA: Diagnosis not present

## 2021-11-18 DIAGNOSIS — I82409 Acute embolism and thrombosis of unspecified deep veins of unspecified lower extremity: Secondary | ICD-10-CM | POA: Diagnosis not present

## 2021-11-18 DIAGNOSIS — D509 Iron deficiency anemia, unspecified: Secondary | ICD-10-CM | POA: Diagnosis not present

## 2021-11-20 DIAGNOSIS — D631 Anemia in chronic kidney disease: Secondary | ICD-10-CM | POA: Diagnosis not present

## 2021-11-20 DIAGNOSIS — N2581 Secondary hyperparathyroidism of renal origin: Secondary | ICD-10-CM | POA: Diagnosis not present

## 2021-11-20 DIAGNOSIS — D509 Iron deficiency anemia, unspecified: Secondary | ICD-10-CM | POA: Diagnosis not present

## 2021-11-20 DIAGNOSIS — N186 End stage renal disease: Secondary | ICD-10-CM | POA: Diagnosis not present

## 2021-11-22 DIAGNOSIS — D631 Anemia in chronic kidney disease: Secondary | ICD-10-CM | POA: Diagnosis not present

## 2021-11-22 DIAGNOSIS — N2581 Secondary hyperparathyroidism of renal origin: Secondary | ICD-10-CM | POA: Diagnosis not present

## 2021-11-22 DIAGNOSIS — D509 Iron deficiency anemia, unspecified: Secondary | ICD-10-CM | POA: Diagnosis not present

## 2021-11-22 DIAGNOSIS — N186 End stage renal disease: Secondary | ICD-10-CM | POA: Diagnosis not present

## 2021-11-24 DIAGNOSIS — N2581 Secondary hyperparathyroidism of renal origin: Secondary | ICD-10-CM | POA: Diagnosis not present

## 2021-11-24 DIAGNOSIS — N186 End stage renal disease: Secondary | ICD-10-CM | POA: Diagnosis not present

## 2021-11-24 DIAGNOSIS — D509 Iron deficiency anemia, unspecified: Secondary | ICD-10-CM | POA: Diagnosis not present

## 2021-11-24 DIAGNOSIS — I82409 Acute embolism and thrombosis of unspecified deep veins of unspecified lower extremity: Secondary | ICD-10-CM | POA: Diagnosis not present

## 2021-11-24 DIAGNOSIS — D631 Anemia in chronic kidney disease: Secondary | ICD-10-CM | POA: Diagnosis not present

## 2021-11-26 DIAGNOSIS — D631 Anemia in chronic kidney disease: Secondary | ICD-10-CM | POA: Diagnosis not present

## 2021-11-26 DIAGNOSIS — N2581 Secondary hyperparathyroidism of renal origin: Secondary | ICD-10-CM | POA: Diagnosis not present

## 2021-11-26 DIAGNOSIS — N186 End stage renal disease: Secondary | ICD-10-CM | POA: Diagnosis not present

## 2021-11-26 DIAGNOSIS — D509 Iron deficiency anemia, unspecified: Secondary | ICD-10-CM | POA: Diagnosis not present

## 2021-11-29 DIAGNOSIS — D631 Anemia in chronic kidney disease: Secondary | ICD-10-CM | POA: Diagnosis not present

## 2021-11-29 DIAGNOSIS — N2581 Secondary hyperparathyroidism of renal origin: Secondary | ICD-10-CM | POA: Diagnosis not present

## 2021-11-29 DIAGNOSIS — N186 End stage renal disease: Secondary | ICD-10-CM | POA: Diagnosis not present

## 2021-11-29 DIAGNOSIS — D509 Iron deficiency anemia, unspecified: Secondary | ICD-10-CM | POA: Diagnosis not present

## 2021-12-02 DIAGNOSIS — D631 Anemia in chronic kidney disease: Secondary | ICD-10-CM | POA: Diagnosis not present

## 2021-12-02 DIAGNOSIS — N186 End stage renal disease: Secondary | ICD-10-CM | POA: Diagnosis not present

## 2021-12-02 DIAGNOSIS — D509 Iron deficiency anemia, unspecified: Secondary | ICD-10-CM | POA: Diagnosis not present

## 2021-12-02 DIAGNOSIS — N2581 Secondary hyperparathyroidism of renal origin: Secondary | ICD-10-CM | POA: Diagnosis not present

## 2021-12-02 DIAGNOSIS — I82409 Acute embolism and thrombosis of unspecified deep veins of unspecified lower extremity: Secondary | ICD-10-CM | POA: Diagnosis not present

## 2021-12-04 DIAGNOSIS — N186 End stage renal disease: Secondary | ICD-10-CM | POA: Diagnosis not present

## 2021-12-04 DIAGNOSIS — N2581 Secondary hyperparathyroidism of renal origin: Secondary | ICD-10-CM | POA: Diagnosis not present

## 2021-12-04 DIAGNOSIS — D509 Iron deficiency anemia, unspecified: Secondary | ICD-10-CM | POA: Diagnosis not present

## 2021-12-04 DIAGNOSIS — D631 Anemia in chronic kidney disease: Secondary | ICD-10-CM | POA: Diagnosis not present

## 2021-12-05 DIAGNOSIS — N186 End stage renal disease: Secondary | ICD-10-CM | POA: Diagnosis not present

## 2021-12-05 DIAGNOSIS — Z992 Dependence on renal dialysis: Secondary | ICD-10-CM | POA: Diagnosis not present

## 2021-12-06 DIAGNOSIS — D631 Anemia in chronic kidney disease: Secondary | ICD-10-CM | POA: Diagnosis not present

## 2021-12-06 DIAGNOSIS — N2581 Secondary hyperparathyroidism of renal origin: Secondary | ICD-10-CM | POA: Diagnosis not present

## 2021-12-06 DIAGNOSIS — N186 End stage renal disease: Secondary | ICD-10-CM | POA: Diagnosis not present

## 2021-12-06 DIAGNOSIS — D509 Iron deficiency anemia, unspecified: Secondary | ICD-10-CM | POA: Diagnosis not present

## 2021-12-09 DIAGNOSIS — D631 Anemia in chronic kidney disease: Secondary | ICD-10-CM | POA: Diagnosis not present

## 2021-12-09 DIAGNOSIS — D509 Iron deficiency anemia, unspecified: Secondary | ICD-10-CM | POA: Diagnosis not present

## 2021-12-09 DIAGNOSIS — N186 End stage renal disease: Secondary | ICD-10-CM | POA: Diagnosis not present

## 2021-12-09 DIAGNOSIS — N2581 Secondary hyperparathyroidism of renal origin: Secondary | ICD-10-CM | POA: Diagnosis not present

## 2021-12-09 DIAGNOSIS — I82409 Acute embolism and thrombosis of unspecified deep veins of unspecified lower extremity: Secondary | ICD-10-CM | POA: Diagnosis not present

## 2021-12-11 DIAGNOSIS — N186 End stage renal disease: Secondary | ICD-10-CM | POA: Diagnosis not present

## 2021-12-11 DIAGNOSIS — E119 Type 2 diabetes mellitus without complications: Secondary | ICD-10-CM | POA: Diagnosis not present

## 2021-12-11 DIAGNOSIS — D631 Anemia in chronic kidney disease: Secondary | ICD-10-CM | POA: Diagnosis not present

## 2021-12-11 DIAGNOSIS — D509 Iron deficiency anemia, unspecified: Secondary | ICD-10-CM | POA: Diagnosis not present

## 2021-12-11 DIAGNOSIS — N2581 Secondary hyperparathyroidism of renal origin: Secondary | ICD-10-CM | POA: Diagnosis not present

## 2021-12-13 DIAGNOSIS — D631 Anemia in chronic kidney disease: Secondary | ICD-10-CM | POA: Diagnosis not present

## 2021-12-13 DIAGNOSIS — N186 End stage renal disease: Secondary | ICD-10-CM | POA: Diagnosis not present

## 2021-12-13 DIAGNOSIS — D509 Iron deficiency anemia, unspecified: Secondary | ICD-10-CM | POA: Diagnosis not present

## 2021-12-13 DIAGNOSIS — N2581 Secondary hyperparathyroidism of renal origin: Secondary | ICD-10-CM | POA: Diagnosis not present

## 2021-12-16 DIAGNOSIS — N186 End stage renal disease: Secondary | ICD-10-CM | POA: Diagnosis not present

## 2021-12-16 DIAGNOSIS — I82409 Acute embolism and thrombosis of unspecified deep veins of unspecified lower extremity: Secondary | ICD-10-CM | POA: Diagnosis not present

## 2021-12-16 DIAGNOSIS — D631 Anemia in chronic kidney disease: Secondary | ICD-10-CM | POA: Diagnosis not present

## 2021-12-16 DIAGNOSIS — D509 Iron deficiency anemia, unspecified: Secondary | ICD-10-CM | POA: Diagnosis not present

## 2021-12-16 DIAGNOSIS — N2581 Secondary hyperparathyroidism of renal origin: Secondary | ICD-10-CM | POA: Diagnosis not present

## 2021-12-18 DIAGNOSIS — N186 End stage renal disease: Secondary | ICD-10-CM | POA: Diagnosis not present

## 2021-12-18 DIAGNOSIS — D631 Anemia in chronic kidney disease: Secondary | ICD-10-CM | POA: Diagnosis not present

## 2021-12-18 DIAGNOSIS — D509 Iron deficiency anemia, unspecified: Secondary | ICD-10-CM | POA: Diagnosis not present

## 2021-12-18 DIAGNOSIS — N2581 Secondary hyperparathyroidism of renal origin: Secondary | ICD-10-CM | POA: Diagnosis not present

## 2021-12-20 DIAGNOSIS — N186 End stage renal disease: Secondary | ICD-10-CM | POA: Diagnosis not present

## 2021-12-20 DIAGNOSIS — D509 Iron deficiency anemia, unspecified: Secondary | ICD-10-CM | POA: Diagnosis not present

## 2021-12-20 DIAGNOSIS — N2581 Secondary hyperparathyroidism of renal origin: Secondary | ICD-10-CM | POA: Diagnosis not present

## 2021-12-20 DIAGNOSIS — D631 Anemia in chronic kidney disease: Secondary | ICD-10-CM | POA: Diagnosis not present

## 2021-12-23 DIAGNOSIS — N186 End stage renal disease: Secondary | ICD-10-CM | POA: Diagnosis not present

## 2021-12-23 DIAGNOSIS — N2581 Secondary hyperparathyroidism of renal origin: Secondary | ICD-10-CM | POA: Diagnosis not present

## 2021-12-23 DIAGNOSIS — D631 Anemia in chronic kidney disease: Secondary | ICD-10-CM | POA: Diagnosis not present

## 2021-12-23 DIAGNOSIS — D509 Iron deficiency anemia, unspecified: Secondary | ICD-10-CM | POA: Diagnosis not present

## 2021-12-23 DIAGNOSIS — I82409 Acute embolism and thrombosis of unspecified deep veins of unspecified lower extremity: Secondary | ICD-10-CM | POA: Diagnosis not present

## 2021-12-25 DIAGNOSIS — D631 Anemia in chronic kidney disease: Secondary | ICD-10-CM | POA: Diagnosis not present

## 2021-12-25 DIAGNOSIS — N2581 Secondary hyperparathyroidism of renal origin: Secondary | ICD-10-CM | POA: Diagnosis not present

## 2021-12-25 DIAGNOSIS — N186 End stage renal disease: Secondary | ICD-10-CM | POA: Diagnosis not present

## 2021-12-25 DIAGNOSIS — D509 Iron deficiency anemia, unspecified: Secondary | ICD-10-CM | POA: Diagnosis not present

## 2021-12-27 DIAGNOSIS — N2581 Secondary hyperparathyroidism of renal origin: Secondary | ICD-10-CM | POA: Diagnosis not present

## 2021-12-27 DIAGNOSIS — D631 Anemia in chronic kidney disease: Secondary | ICD-10-CM | POA: Diagnosis not present

## 2021-12-27 DIAGNOSIS — N186 End stage renal disease: Secondary | ICD-10-CM | POA: Diagnosis not present

## 2021-12-27 DIAGNOSIS — D509 Iron deficiency anemia, unspecified: Secondary | ICD-10-CM | POA: Diagnosis not present

## 2021-12-29 DIAGNOSIS — N2581 Secondary hyperparathyroidism of renal origin: Secondary | ICD-10-CM | POA: Diagnosis not present

## 2021-12-29 DIAGNOSIS — D631 Anemia in chronic kidney disease: Secondary | ICD-10-CM | POA: Diagnosis not present

## 2021-12-29 DIAGNOSIS — D509 Iron deficiency anemia, unspecified: Secondary | ICD-10-CM | POA: Diagnosis not present

## 2021-12-29 DIAGNOSIS — N186 End stage renal disease: Secondary | ICD-10-CM | POA: Diagnosis not present

## 2022-01-01 DIAGNOSIS — N2581 Secondary hyperparathyroidism of renal origin: Secondary | ICD-10-CM | POA: Diagnosis not present

## 2022-01-01 DIAGNOSIS — D509 Iron deficiency anemia, unspecified: Secondary | ICD-10-CM | POA: Diagnosis not present

## 2022-01-01 DIAGNOSIS — D631 Anemia in chronic kidney disease: Secondary | ICD-10-CM | POA: Diagnosis not present

## 2022-01-01 DIAGNOSIS — N186 End stage renal disease: Secondary | ICD-10-CM | POA: Diagnosis not present

## 2022-01-03 DIAGNOSIS — D509 Iron deficiency anemia, unspecified: Secondary | ICD-10-CM | POA: Diagnosis not present

## 2022-01-03 DIAGNOSIS — N186 End stage renal disease: Secondary | ICD-10-CM | POA: Diagnosis not present

## 2022-01-03 DIAGNOSIS — Z7901 Long term (current) use of anticoagulants: Secondary | ICD-10-CM | POA: Diagnosis not present

## 2022-01-03 DIAGNOSIS — N2581 Secondary hyperparathyroidism of renal origin: Secondary | ICD-10-CM | POA: Diagnosis not present

## 2022-01-03 DIAGNOSIS — D631 Anemia in chronic kidney disease: Secondary | ICD-10-CM | POA: Diagnosis not present

## 2022-01-05 DIAGNOSIS — N186 End stage renal disease: Secondary | ICD-10-CM | POA: Diagnosis not present

## 2022-01-05 DIAGNOSIS — N2581 Secondary hyperparathyroidism of renal origin: Secondary | ICD-10-CM | POA: Diagnosis not present

## 2022-01-05 DIAGNOSIS — Z992 Dependence on renal dialysis: Secondary | ICD-10-CM | POA: Diagnosis not present

## 2022-01-05 DIAGNOSIS — D631 Anemia in chronic kidney disease: Secondary | ICD-10-CM | POA: Diagnosis not present

## 2022-01-05 DIAGNOSIS — D509 Iron deficiency anemia, unspecified: Secondary | ICD-10-CM | POA: Diagnosis not present

## 2022-01-08 DIAGNOSIS — D509 Iron deficiency anemia, unspecified: Secondary | ICD-10-CM | POA: Diagnosis not present

## 2022-01-08 DIAGNOSIS — D631 Anemia in chronic kidney disease: Secondary | ICD-10-CM | POA: Diagnosis not present

## 2022-01-08 DIAGNOSIS — N186 End stage renal disease: Secondary | ICD-10-CM | POA: Diagnosis not present

## 2022-01-08 DIAGNOSIS — E119 Type 2 diabetes mellitus without complications: Secondary | ICD-10-CM | POA: Diagnosis not present

## 2022-01-08 DIAGNOSIS — N2581 Secondary hyperparathyroidism of renal origin: Secondary | ICD-10-CM | POA: Diagnosis not present

## 2022-01-08 DIAGNOSIS — I82409 Acute embolism and thrombosis of unspecified deep veins of unspecified lower extremity: Secondary | ICD-10-CM | POA: Diagnosis not present

## 2022-01-10 DIAGNOSIS — N186 End stage renal disease: Secondary | ICD-10-CM | POA: Diagnosis not present

## 2022-01-10 DIAGNOSIS — D509 Iron deficiency anemia, unspecified: Secondary | ICD-10-CM | POA: Diagnosis not present

## 2022-01-10 DIAGNOSIS — N2581 Secondary hyperparathyroidism of renal origin: Secondary | ICD-10-CM | POA: Diagnosis not present

## 2022-01-10 DIAGNOSIS — D631 Anemia in chronic kidney disease: Secondary | ICD-10-CM | POA: Diagnosis not present

## 2022-01-13 DIAGNOSIS — N186 End stage renal disease: Secondary | ICD-10-CM | POA: Diagnosis not present

## 2022-01-13 DIAGNOSIS — I82409 Acute embolism and thrombosis of unspecified deep veins of unspecified lower extremity: Secondary | ICD-10-CM | POA: Diagnosis not present

## 2022-01-13 DIAGNOSIS — D509 Iron deficiency anemia, unspecified: Secondary | ICD-10-CM | POA: Diagnosis not present

## 2022-01-13 DIAGNOSIS — N2581 Secondary hyperparathyroidism of renal origin: Secondary | ICD-10-CM | POA: Diagnosis not present

## 2022-01-13 DIAGNOSIS — D631 Anemia in chronic kidney disease: Secondary | ICD-10-CM | POA: Diagnosis not present

## 2022-01-15 DIAGNOSIS — N186 End stage renal disease: Secondary | ICD-10-CM | POA: Diagnosis not present

## 2022-01-15 DIAGNOSIS — D631 Anemia in chronic kidney disease: Secondary | ICD-10-CM | POA: Diagnosis not present

## 2022-01-15 DIAGNOSIS — D509 Iron deficiency anemia, unspecified: Secondary | ICD-10-CM | POA: Diagnosis not present

## 2022-01-15 DIAGNOSIS — N2581 Secondary hyperparathyroidism of renal origin: Secondary | ICD-10-CM | POA: Diagnosis not present

## 2022-01-17 DIAGNOSIS — N2581 Secondary hyperparathyroidism of renal origin: Secondary | ICD-10-CM | POA: Diagnosis not present

## 2022-01-17 DIAGNOSIS — N186 End stage renal disease: Secondary | ICD-10-CM | POA: Diagnosis not present

## 2022-01-17 DIAGNOSIS — D509 Iron deficiency anemia, unspecified: Secondary | ICD-10-CM | POA: Diagnosis not present

## 2022-01-17 DIAGNOSIS — D631 Anemia in chronic kidney disease: Secondary | ICD-10-CM | POA: Diagnosis not present

## 2022-01-20 DIAGNOSIS — I82409 Acute embolism and thrombosis of unspecified deep veins of unspecified lower extremity: Secondary | ICD-10-CM | POA: Diagnosis not present

## 2022-01-20 DIAGNOSIS — N2581 Secondary hyperparathyroidism of renal origin: Secondary | ICD-10-CM | POA: Diagnosis not present

## 2022-01-20 DIAGNOSIS — D509 Iron deficiency anemia, unspecified: Secondary | ICD-10-CM | POA: Diagnosis not present

## 2022-01-20 DIAGNOSIS — N186 End stage renal disease: Secondary | ICD-10-CM | POA: Diagnosis not present

## 2022-01-20 DIAGNOSIS — D631 Anemia in chronic kidney disease: Secondary | ICD-10-CM | POA: Diagnosis not present

## 2022-01-22 DIAGNOSIS — N186 End stage renal disease: Secondary | ICD-10-CM | POA: Diagnosis not present

## 2022-01-22 DIAGNOSIS — D631 Anemia in chronic kidney disease: Secondary | ICD-10-CM | POA: Diagnosis not present

## 2022-01-22 DIAGNOSIS — D509 Iron deficiency anemia, unspecified: Secondary | ICD-10-CM | POA: Diagnosis not present

## 2022-01-22 DIAGNOSIS — N2581 Secondary hyperparathyroidism of renal origin: Secondary | ICD-10-CM | POA: Diagnosis not present

## 2022-01-24 DIAGNOSIS — D631 Anemia in chronic kidney disease: Secondary | ICD-10-CM | POA: Diagnosis not present

## 2022-01-24 DIAGNOSIS — N2581 Secondary hyperparathyroidism of renal origin: Secondary | ICD-10-CM | POA: Diagnosis not present

## 2022-01-24 DIAGNOSIS — D509 Iron deficiency anemia, unspecified: Secondary | ICD-10-CM | POA: Diagnosis not present

## 2022-01-24 DIAGNOSIS — N186 End stage renal disease: Secondary | ICD-10-CM | POA: Diagnosis not present

## 2022-01-27 DIAGNOSIS — D631 Anemia in chronic kidney disease: Secondary | ICD-10-CM | POA: Diagnosis not present

## 2022-01-27 DIAGNOSIS — N2581 Secondary hyperparathyroidism of renal origin: Secondary | ICD-10-CM | POA: Diagnosis not present

## 2022-01-27 DIAGNOSIS — I82409 Acute embolism and thrombosis of unspecified deep veins of unspecified lower extremity: Secondary | ICD-10-CM | POA: Diagnosis not present

## 2022-01-27 DIAGNOSIS — D509 Iron deficiency anemia, unspecified: Secondary | ICD-10-CM | POA: Diagnosis not present

## 2022-01-27 DIAGNOSIS — N186 End stage renal disease: Secondary | ICD-10-CM | POA: Diagnosis not present

## 2022-01-29 DIAGNOSIS — D509 Iron deficiency anemia, unspecified: Secondary | ICD-10-CM | POA: Diagnosis not present

## 2022-01-29 DIAGNOSIS — D631 Anemia in chronic kidney disease: Secondary | ICD-10-CM | POA: Diagnosis not present

## 2022-01-29 DIAGNOSIS — N2581 Secondary hyperparathyroidism of renal origin: Secondary | ICD-10-CM | POA: Diagnosis not present

## 2022-01-29 DIAGNOSIS — N186 End stage renal disease: Secondary | ICD-10-CM | POA: Diagnosis not present

## 2022-01-31 DIAGNOSIS — D631 Anemia in chronic kidney disease: Secondary | ICD-10-CM | POA: Diagnosis not present

## 2022-01-31 DIAGNOSIS — N2581 Secondary hyperparathyroidism of renal origin: Secondary | ICD-10-CM | POA: Diagnosis not present

## 2022-01-31 DIAGNOSIS — D509 Iron deficiency anemia, unspecified: Secondary | ICD-10-CM | POA: Diagnosis not present

## 2022-01-31 DIAGNOSIS — N186 End stage renal disease: Secondary | ICD-10-CM | POA: Diagnosis not present

## 2022-02-03 DIAGNOSIS — D631 Anemia in chronic kidney disease: Secondary | ICD-10-CM | POA: Diagnosis not present

## 2022-02-03 DIAGNOSIS — I82409 Acute embolism and thrombosis of unspecified deep veins of unspecified lower extremity: Secondary | ICD-10-CM | POA: Diagnosis not present

## 2022-02-03 DIAGNOSIS — N2581 Secondary hyperparathyroidism of renal origin: Secondary | ICD-10-CM | POA: Diagnosis not present

## 2022-02-03 DIAGNOSIS — D509 Iron deficiency anemia, unspecified: Secondary | ICD-10-CM | POA: Diagnosis not present

## 2022-02-03 DIAGNOSIS — N186 End stage renal disease: Secondary | ICD-10-CM | POA: Diagnosis not present

## 2022-02-05 DIAGNOSIS — Z992 Dependence on renal dialysis: Secondary | ICD-10-CM | POA: Diagnosis not present

## 2022-02-05 DIAGNOSIS — N2581 Secondary hyperparathyroidism of renal origin: Secondary | ICD-10-CM | POA: Diagnosis not present

## 2022-02-05 DIAGNOSIS — D631 Anemia in chronic kidney disease: Secondary | ICD-10-CM | POA: Diagnosis not present

## 2022-02-05 DIAGNOSIS — D509 Iron deficiency anemia, unspecified: Secondary | ICD-10-CM | POA: Diagnosis not present

## 2022-02-05 DIAGNOSIS — N186 End stage renal disease: Secondary | ICD-10-CM | POA: Diagnosis not present

## 2022-02-07 DIAGNOSIS — D631 Anemia in chronic kidney disease: Secondary | ICD-10-CM | POA: Diagnosis not present

## 2022-02-07 DIAGNOSIS — N186 End stage renal disease: Secondary | ICD-10-CM | POA: Diagnosis not present

## 2022-02-07 DIAGNOSIS — Z1159 Encounter for screening for other viral diseases: Secondary | ICD-10-CM | POA: Diagnosis not present

## 2022-02-07 DIAGNOSIS — Z114 Encounter for screening for human immunodeficiency virus [HIV]: Secondary | ICD-10-CM | POA: Diagnosis not present

## 2022-02-07 DIAGNOSIS — N2581 Secondary hyperparathyroidism of renal origin: Secondary | ICD-10-CM | POA: Diagnosis not present

## 2022-02-07 DIAGNOSIS — D509 Iron deficiency anemia, unspecified: Secondary | ICD-10-CM | POA: Diagnosis not present

## 2022-02-10 DIAGNOSIS — D509 Iron deficiency anemia, unspecified: Secondary | ICD-10-CM | POA: Diagnosis not present

## 2022-02-10 DIAGNOSIS — N186 End stage renal disease: Secondary | ICD-10-CM | POA: Diagnosis not present

## 2022-02-10 DIAGNOSIS — I82409 Acute embolism and thrombosis of unspecified deep veins of unspecified lower extremity: Secondary | ICD-10-CM | POA: Diagnosis not present

## 2022-02-10 DIAGNOSIS — N2581 Secondary hyperparathyroidism of renal origin: Secondary | ICD-10-CM | POA: Diagnosis not present

## 2022-02-10 DIAGNOSIS — D631 Anemia in chronic kidney disease: Secondary | ICD-10-CM | POA: Diagnosis not present

## 2022-02-12 DIAGNOSIS — D509 Iron deficiency anemia, unspecified: Secondary | ICD-10-CM | POA: Diagnosis not present

## 2022-02-12 DIAGNOSIS — D631 Anemia in chronic kidney disease: Secondary | ICD-10-CM | POA: Diagnosis not present

## 2022-02-12 DIAGNOSIS — N2581 Secondary hyperparathyroidism of renal origin: Secondary | ICD-10-CM | POA: Diagnosis not present

## 2022-02-12 DIAGNOSIS — E119 Type 2 diabetes mellitus without complications: Secondary | ICD-10-CM | POA: Diagnosis not present

## 2022-02-12 DIAGNOSIS — N186 End stage renal disease: Secondary | ICD-10-CM | POA: Diagnosis not present

## 2022-02-13 DIAGNOSIS — H401131 Primary open-angle glaucoma, bilateral, mild stage: Secondary | ICD-10-CM | POA: Diagnosis not present

## 2022-02-13 DIAGNOSIS — R6889 Other general symptoms and signs: Secondary | ICD-10-CM | POA: Diagnosis not present

## 2022-02-13 DIAGNOSIS — H5213 Myopia, bilateral: Secondary | ICD-10-CM | POA: Diagnosis not present

## 2022-02-13 DIAGNOSIS — H524 Presbyopia: Secondary | ICD-10-CM | POA: Diagnosis not present

## 2022-02-13 DIAGNOSIS — Z961 Presence of intraocular lens: Secondary | ICD-10-CM | POA: Diagnosis not present

## 2022-02-14 DIAGNOSIS — D509 Iron deficiency anemia, unspecified: Secondary | ICD-10-CM | POA: Diagnosis not present

## 2022-02-14 DIAGNOSIS — N186 End stage renal disease: Secondary | ICD-10-CM | POA: Diagnosis not present

## 2022-02-14 DIAGNOSIS — D631 Anemia in chronic kidney disease: Secondary | ICD-10-CM | POA: Diagnosis not present

## 2022-02-14 DIAGNOSIS — N2581 Secondary hyperparathyroidism of renal origin: Secondary | ICD-10-CM | POA: Diagnosis not present

## 2022-02-17 DIAGNOSIS — D631 Anemia in chronic kidney disease: Secondary | ICD-10-CM | POA: Diagnosis not present

## 2022-02-17 DIAGNOSIS — D509 Iron deficiency anemia, unspecified: Secondary | ICD-10-CM | POA: Diagnosis not present

## 2022-02-17 DIAGNOSIS — N2581 Secondary hyperparathyroidism of renal origin: Secondary | ICD-10-CM | POA: Diagnosis not present

## 2022-02-17 DIAGNOSIS — N186 End stage renal disease: Secondary | ICD-10-CM | POA: Diagnosis not present

## 2022-02-17 DIAGNOSIS — I4891 Unspecified atrial fibrillation: Secondary | ICD-10-CM | POA: Diagnosis not present

## 2022-02-19 DIAGNOSIS — D631 Anemia in chronic kidney disease: Secondary | ICD-10-CM | POA: Diagnosis not present

## 2022-02-19 DIAGNOSIS — N186 End stage renal disease: Secondary | ICD-10-CM | POA: Diagnosis not present

## 2022-02-19 DIAGNOSIS — N2581 Secondary hyperparathyroidism of renal origin: Secondary | ICD-10-CM | POA: Diagnosis not present

## 2022-02-19 DIAGNOSIS — D509 Iron deficiency anemia, unspecified: Secondary | ICD-10-CM | POA: Diagnosis not present

## 2022-02-21 DIAGNOSIS — D631 Anemia in chronic kidney disease: Secondary | ICD-10-CM | POA: Diagnosis not present

## 2022-02-21 DIAGNOSIS — N186 End stage renal disease: Secondary | ICD-10-CM | POA: Diagnosis not present

## 2022-02-21 DIAGNOSIS — N2581 Secondary hyperparathyroidism of renal origin: Secondary | ICD-10-CM | POA: Diagnosis not present

## 2022-02-21 DIAGNOSIS — D509 Iron deficiency anemia, unspecified: Secondary | ICD-10-CM | POA: Diagnosis not present

## 2022-02-24 DIAGNOSIS — N186 End stage renal disease: Secondary | ICD-10-CM | POA: Diagnosis not present

## 2022-02-24 DIAGNOSIS — N2581 Secondary hyperparathyroidism of renal origin: Secondary | ICD-10-CM | POA: Diagnosis not present

## 2022-02-24 DIAGNOSIS — D631 Anemia in chronic kidney disease: Secondary | ICD-10-CM | POA: Diagnosis not present

## 2022-02-24 DIAGNOSIS — I82409 Acute embolism and thrombosis of unspecified deep veins of unspecified lower extremity: Secondary | ICD-10-CM | POA: Diagnosis not present

## 2022-02-24 DIAGNOSIS — D509 Iron deficiency anemia, unspecified: Secondary | ICD-10-CM | POA: Diagnosis not present

## 2022-02-25 DIAGNOSIS — Z1330 Encounter for screening examination for mental health and behavioral disorders, unspecified: Secondary | ICD-10-CM | POA: Diagnosis not present

## 2022-02-25 DIAGNOSIS — E1122 Type 2 diabetes mellitus with diabetic chronic kidney disease: Secondary | ICD-10-CM | POA: Diagnosis not present

## 2022-02-25 DIAGNOSIS — M329 Systemic lupus erythematosus, unspecified: Secondary | ICD-10-CM | POA: Diagnosis not present

## 2022-02-25 DIAGNOSIS — N186 End stage renal disease: Secondary | ICD-10-CM | POA: Diagnosis not present

## 2022-02-25 DIAGNOSIS — Z79899 Other long term (current) drug therapy: Secondary | ICD-10-CM | POA: Diagnosis not present

## 2022-02-25 DIAGNOSIS — D6862 Lupus anticoagulant syndrome: Secondary | ICD-10-CM | POA: Diagnosis not present

## 2022-02-26 DIAGNOSIS — D509 Iron deficiency anemia, unspecified: Secondary | ICD-10-CM | POA: Diagnosis not present

## 2022-02-26 DIAGNOSIS — N2581 Secondary hyperparathyroidism of renal origin: Secondary | ICD-10-CM | POA: Diagnosis not present

## 2022-02-26 DIAGNOSIS — N186 End stage renal disease: Secondary | ICD-10-CM | POA: Diagnosis not present

## 2022-02-26 DIAGNOSIS — D631 Anemia in chronic kidney disease: Secondary | ICD-10-CM | POA: Diagnosis not present

## 2022-02-28 DIAGNOSIS — D509 Iron deficiency anemia, unspecified: Secondary | ICD-10-CM | POA: Diagnosis not present

## 2022-02-28 DIAGNOSIS — N2581 Secondary hyperparathyroidism of renal origin: Secondary | ICD-10-CM | POA: Diagnosis not present

## 2022-02-28 DIAGNOSIS — N186 End stage renal disease: Secondary | ICD-10-CM | POA: Diagnosis not present

## 2022-02-28 DIAGNOSIS — D631 Anemia in chronic kidney disease: Secondary | ICD-10-CM | POA: Diagnosis not present

## 2022-03-03 DIAGNOSIS — N186 End stage renal disease: Secondary | ICD-10-CM | POA: Diagnosis not present

## 2022-03-03 DIAGNOSIS — D631 Anemia in chronic kidney disease: Secondary | ICD-10-CM | POA: Diagnosis not present

## 2022-03-03 DIAGNOSIS — D509 Iron deficiency anemia, unspecified: Secondary | ICD-10-CM | POA: Diagnosis not present

## 2022-03-03 DIAGNOSIS — N2581 Secondary hyperparathyroidism of renal origin: Secondary | ICD-10-CM | POA: Diagnosis not present

## 2022-03-03 DIAGNOSIS — I82409 Acute embolism and thrombosis of unspecified deep veins of unspecified lower extremity: Secondary | ICD-10-CM | POA: Diagnosis not present

## 2022-03-05 DIAGNOSIS — N186 End stage renal disease: Secondary | ICD-10-CM | POA: Diagnosis not present

## 2022-03-05 DIAGNOSIS — D509 Iron deficiency anemia, unspecified: Secondary | ICD-10-CM | POA: Diagnosis not present

## 2022-03-05 DIAGNOSIS — N2581 Secondary hyperparathyroidism of renal origin: Secondary | ICD-10-CM | POA: Diagnosis not present

## 2022-03-05 DIAGNOSIS — D631 Anemia in chronic kidney disease: Secondary | ICD-10-CM | POA: Diagnosis not present

## 2022-03-06 DIAGNOSIS — N186 End stage renal disease: Secondary | ICD-10-CM | POA: Diagnosis not present

## 2022-03-06 DIAGNOSIS — Z992 Dependence on renal dialysis: Secondary | ICD-10-CM | POA: Diagnosis not present

## 2022-03-07 DIAGNOSIS — N2581 Secondary hyperparathyroidism of renal origin: Secondary | ICD-10-CM | POA: Diagnosis not present

## 2022-03-07 DIAGNOSIS — D631 Anemia in chronic kidney disease: Secondary | ICD-10-CM | POA: Diagnosis not present

## 2022-03-07 DIAGNOSIS — D509 Iron deficiency anemia, unspecified: Secondary | ICD-10-CM | POA: Diagnosis not present

## 2022-03-07 DIAGNOSIS — N186 End stage renal disease: Secondary | ICD-10-CM | POA: Diagnosis not present

## 2022-03-10 DIAGNOSIS — D509 Iron deficiency anemia, unspecified: Secondary | ICD-10-CM | POA: Diagnosis not present

## 2022-03-10 DIAGNOSIS — I82409 Acute embolism and thrombosis of unspecified deep veins of unspecified lower extremity: Secondary | ICD-10-CM | POA: Diagnosis not present

## 2022-03-10 DIAGNOSIS — N186 End stage renal disease: Secondary | ICD-10-CM | POA: Diagnosis not present

## 2022-03-10 DIAGNOSIS — N2581 Secondary hyperparathyroidism of renal origin: Secondary | ICD-10-CM | POA: Diagnosis not present

## 2022-03-10 DIAGNOSIS — D631 Anemia in chronic kidney disease: Secondary | ICD-10-CM | POA: Diagnosis not present

## 2022-03-12 DIAGNOSIS — N186 End stage renal disease: Secondary | ICD-10-CM | POA: Diagnosis not present

## 2022-03-12 DIAGNOSIS — D509 Iron deficiency anemia, unspecified: Secondary | ICD-10-CM | POA: Diagnosis not present

## 2022-03-12 DIAGNOSIS — E119 Type 2 diabetes mellitus without complications: Secondary | ICD-10-CM | POA: Diagnosis not present

## 2022-03-12 DIAGNOSIS — N2581 Secondary hyperparathyroidism of renal origin: Secondary | ICD-10-CM | POA: Diagnosis not present

## 2022-03-12 DIAGNOSIS — D631 Anemia in chronic kidney disease: Secondary | ICD-10-CM | POA: Diagnosis not present

## 2022-03-14 DIAGNOSIS — D509 Iron deficiency anemia, unspecified: Secondary | ICD-10-CM | POA: Diagnosis not present

## 2022-03-14 DIAGNOSIS — N2581 Secondary hyperparathyroidism of renal origin: Secondary | ICD-10-CM | POA: Diagnosis not present

## 2022-03-14 DIAGNOSIS — N186 End stage renal disease: Secondary | ICD-10-CM | POA: Diagnosis not present

## 2022-03-14 DIAGNOSIS — D631 Anemia in chronic kidney disease: Secondary | ICD-10-CM | POA: Diagnosis not present

## 2022-03-17 DIAGNOSIS — N186 End stage renal disease: Secondary | ICD-10-CM | POA: Diagnosis not present

## 2022-03-17 DIAGNOSIS — I4891 Unspecified atrial fibrillation: Secondary | ICD-10-CM | POA: Diagnosis not present

## 2022-03-17 DIAGNOSIS — N2581 Secondary hyperparathyroidism of renal origin: Secondary | ICD-10-CM | POA: Diagnosis not present

## 2022-03-17 DIAGNOSIS — D631 Anemia in chronic kidney disease: Secondary | ICD-10-CM | POA: Diagnosis not present

## 2022-03-17 DIAGNOSIS — D509 Iron deficiency anemia, unspecified: Secondary | ICD-10-CM | POA: Diagnosis not present

## 2022-03-19 ENCOUNTER — Other Ambulatory Visit: Payer: Self-pay | Admitting: *Deleted

## 2022-03-19 DIAGNOSIS — N186 End stage renal disease: Secondary | ICD-10-CM | POA: Diagnosis not present

## 2022-03-19 DIAGNOSIS — D631 Anemia in chronic kidney disease: Secondary | ICD-10-CM | POA: Diagnosis not present

## 2022-03-19 DIAGNOSIS — D509 Iron deficiency anemia, unspecified: Secondary | ICD-10-CM | POA: Diagnosis not present

## 2022-03-19 DIAGNOSIS — N2581 Secondary hyperparathyroidism of renal origin: Secondary | ICD-10-CM | POA: Diagnosis not present

## 2022-03-19 MED ORDER — HYDROCORTISONE 2.5 % EX CREA: 1.0000 | TOPICAL_CREAM | Freq: Two times a day (BID) | CUTANEOUS | 1 refills | Status: AC | PRN

## 2022-03-21 DIAGNOSIS — D631 Anemia in chronic kidney disease: Secondary | ICD-10-CM | POA: Diagnosis not present

## 2022-03-21 DIAGNOSIS — N186 End stage renal disease: Secondary | ICD-10-CM | POA: Diagnosis not present

## 2022-03-21 DIAGNOSIS — N2581 Secondary hyperparathyroidism of renal origin: Secondary | ICD-10-CM | POA: Diagnosis not present

## 2022-03-21 DIAGNOSIS — D509 Iron deficiency anemia, unspecified: Secondary | ICD-10-CM | POA: Diagnosis not present

## 2022-03-24 DIAGNOSIS — N186 End stage renal disease: Secondary | ICD-10-CM | POA: Diagnosis not present

## 2022-03-24 DIAGNOSIS — D631 Anemia in chronic kidney disease: Secondary | ICD-10-CM | POA: Diagnosis not present

## 2022-03-24 DIAGNOSIS — N2581 Secondary hyperparathyroidism of renal origin: Secondary | ICD-10-CM | POA: Diagnosis not present

## 2022-03-24 DIAGNOSIS — D509 Iron deficiency anemia, unspecified: Secondary | ICD-10-CM | POA: Diagnosis not present

## 2022-03-24 DIAGNOSIS — I82409 Acute embolism and thrombosis of unspecified deep veins of unspecified lower extremity: Secondary | ICD-10-CM | POA: Diagnosis not present

## 2022-03-25 ENCOUNTER — Other Ambulatory Visit: Payer: Self-pay | Admitting: Family Medicine

## 2022-03-26 DIAGNOSIS — D631 Anemia in chronic kidney disease: Secondary | ICD-10-CM | POA: Diagnosis not present

## 2022-03-26 DIAGNOSIS — N2581 Secondary hyperparathyroidism of renal origin: Secondary | ICD-10-CM | POA: Diagnosis not present

## 2022-03-26 DIAGNOSIS — D509 Iron deficiency anemia, unspecified: Secondary | ICD-10-CM | POA: Diagnosis not present

## 2022-03-26 DIAGNOSIS — N186 End stage renal disease: Secondary | ICD-10-CM | POA: Diagnosis not present

## 2022-03-28 DIAGNOSIS — D509 Iron deficiency anemia, unspecified: Secondary | ICD-10-CM | POA: Diagnosis not present

## 2022-03-28 DIAGNOSIS — N186 End stage renal disease: Secondary | ICD-10-CM | POA: Diagnosis not present

## 2022-03-28 DIAGNOSIS — D631 Anemia in chronic kidney disease: Secondary | ICD-10-CM | POA: Diagnosis not present

## 2022-03-28 DIAGNOSIS — N2581 Secondary hyperparathyroidism of renal origin: Secondary | ICD-10-CM | POA: Diagnosis not present

## 2022-03-31 DIAGNOSIS — D631 Anemia in chronic kidney disease: Secondary | ICD-10-CM | POA: Diagnosis not present

## 2022-03-31 DIAGNOSIS — I82409 Acute embolism and thrombosis of unspecified deep veins of unspecified lower extremity: Secondary | ICD-10-CM | POA: Diagnosis not present

## 2022-03-31 DIAGNOSIS — N2581 Secondary hyperparathyroidism of renal origin: Secondary | ICD-10-CM | POA: Diagnosis not present

## 2022-03-31 DIAGNOSIS — D509 Iron deficiency anemia, unspecified: Secondary | ICD-10-CM | POA: Diagnosis not present

## 2022-03-31 DIAGNOSIS — N186 End stage renal disease: Secondary | ICD-10-CM | POA: Diagnosis not present

## 2022-04-02 DIAGNOSIS — N2581 Secondary hyperparathyroidism of renal origin: Secondary | ICD-10-CM | POA: Diagnosis not present

## 2022-04-02 DIAGNOSIS — N186 End stage renal disease: Secondary | ICD-10-CM | POA: Diagnosis not present

## 2022-04-02 DIAGNOSIS — D509 Iron deficiency anemia, unspecified: Secondary | ICD-10-CM | POA: Diagnosis not present

## 2022-04-02 DIAGNOSIS — D631 Anemia in chronic kidney disease: Secondary | ICD-10-CM | POA: Diagnosis not present

## 2022-04-04 DIAGNOSIS — D509 Iron deficiency anemia, unspecified: Secondary | ICD-10-CM | POA: Diagnosis not present

## 2022-04-04 DIAGNOSIS — N186 End stage renal disease: Secondary | ICD-10-CM | POA: Diagnosis not present

## 2022-04-04 DIAGNOSIS — N2581 Secondary hyperparathyroidism of renal origin: Secondary | ICD-10-CM | POA: Diagnosis not present

## 2022-04-04 DIAGNOSIS — D631 Anemia in chronic kidney disease: Secondary | ICD-10-CM | POA: Diagnosis not present

## 2022-04-06 DIAGNOSIS — N186 End stage renal disease: Secondary | ICD-10-CM | POA: Diagnosis not present

## 2022-04-06 DIAGNOSIS — Z992 Dependence on renal dialysis: Secondary | ICD-10-CM | POA: Diagnosis not present

## 2022-04-07 DIAGNOSIS — D631 Anemia in chronic kidney disease: Secondary | ICD-10-CM | POA: Diagnosis not present

## 2022-04-07 DIAGNOSIS — Z23 Encounter for immunization: Secondary | ICD-10-CM | POA: Diagnosis not present

## 2022-04-07 DIAGNOSIS — I82409 Acute embolism and thrombosis of unspecified deep veins of unspecified lower extremity: Secondary | ICD-10-CM | POA: Diagnosis not present

## 2022-04-07 DIAGNOSIS — N186 End stage renal disease: Secondary | ICD-10-CM | POA: Diagnosis not present

## 2022-04-07 DIAGNOSIS — N2581 Secondary hyperparathyroidism of renal origin: Secondary | ICD-10-CM | POA: Diagnosis not present

## 2022-04-07 DIAGNOSIS — D509 Iron deficiency anemia, unspecified: Secondary | ICD-10-CM | POA: Diagnosis not present

## 2022-04-09 DIAGNOSIS — Z23 Encounter for immunization: Secondary | ICD-10-CM | POA: Diagnosis not present

## 2022-04-09 DIAGNOSIS — D631 Anemia in chronic kidney disease: Secondary | ICD-10-CM | POA: Diagnosis not present

## 2022-04-09 DIAGNOSIS — N2581 Secondary hyperparathyroidism of renal origin: Secondary | ICD-10-CM | POA: Diagnosis not present

## 2022-04-09 DIAGNOSIS — N186 End stage renal disease: Secondary | ICD-10-CM | POA: Diagnosis not present

## 2022-04-09 DIAGNOSIS — E119 Type 2 diabetes mellitus without complications: Secondary | ICD-10-CM | POA: Diagnosis not present

## 2022-04-09 DIAGNOSIS — D509 Iron deficiency anemia, unspecified: Secondary | ICD-10-CM | POA: Diagnosis not present

## 2022-04-11 DIAGNOSIS — N2581 Secondary hyperparathyroidism of renal origin: Secondary | ICD-10-CM | POA: Diagnosis not present

## 2022-04-11 DIAGNOSIS — Z23 Encounter for immunization: Secondary | ICD-10-CM | POA: Diagnosis not present

## 2022-04-11 DIAGNOSIS — D631 Anemia in chronic kidney disease: Secondary | ICD-10-CM | POA: Diagnosis not present

## 2022-04-11 DIAGNOSIS — N186 End stage renal disease: Secondary | ICD-10-CM | POA: Diagnosis not present

## 2022-04-11 DIAGNOSIS — D509 Iron deficiency anemia, unspecified: Secondary | ICD-10-CM | POA: Diagnosis not present

## 2022-04-14 DIAGNOSIS — D509 Iron deficiency anemia, unspecified: Secondary | ICD-10-CM | POA: Diagnosis not present

## 2022-04-14 DIAGNOSIS — I82409 Acute embolism and thrombosis of unspecified deep veins of unspecified lower extremity: Secondary | ICD-10-CM | POA: Diagnosis not present

## 2022-04-14 DIAGNOSIS — Z23 Encounter for immunization: Secondary | ICD-10-CM | POA: Diagnosis not present

## 2022-04-14 DIAGNOSIS — N2581 Secondary hyperparathyroidism of renal origin: Secondary | ICD-10-CM | POA: Diagnosis not present

## 2022-04-14 DIAGNOSIS — D631 Anemia in chronic kidney disease: Secondary | ICD-10-CM | POA: Diagnosis not present

## 2022-04-14 DIAGNOSIS — N186 End stage renal disease: Secondary | ICD-10-CM | POA: Diagnosis not present

## 2022-04-16 DIAGNOSIS — Z23 Encounter for immunization: Secondary | ICD-10-CM | POA: Diagnosis not present

## 2022-04-16 DIAGNOSIS — N2581 Secondary hyperparathyroidism of renal origin: Secondary | ICD-10-CM | POA: Diagnosis not present

## 2022-04-16 DIAGNOSIS — D631 Anemia in chronic kidney disease: Secondary | ICD-10-CM | POA: Diagnosis not present

## 2022-04-16 DIAGNOSIS — D509 Iron deficiency anemia, unspecified: Secondary | ICD-10-CM | POA: Diagnosis not present

## 2022-04-16 DIAGNOSIS — N186 End stage renal disease: Secondary | ICD-10-CM | POA: Diagnosis not present

## 2022-04-18 DIAGNOSIS — Z23 Encounter for immunization: Secondary | ICD-10-CM | POA: Diagnosis not present

## 2022-04-18 DIAGNOSIS — N186 End stage renal disease: Secondary | ICD-10-CM | POA: Diagnosis not present

## 2022-04-18 DIAGNOSIS — D631 Anemia in chronic kidney disease: Secondary | ICD-10-CM | POA: Diagnosis not present

## 2022-04-18 DIAGNOSIS — N2581 Secondary hyperparathyroidism of renal origin: Secondary | ICD-10-CM | POA: Diagnosis not present

## 2022-04-18 DIAGNOSIS — D509 Iron deficiency anemia, unspecified: Secondary | ICD-10-CM | POA: Diagnosis not present

## 2022-04-21 DIAGNOSIS — D509 Iron deficiency anemia, unspecified: Secondary | ICD-10-CM | POA: Diagnosis not present

## 2022-04-21 DIAGNOSIS — N2581 Secondary hyperparathyroidism of renal origin: Secondary | ICD-10-CM | POA: Diagnosis not present

## 2022-04-21 DIAGNOSIS — Z23 Encounter for immunization: Secondary | ICD-10-CM | POA: Diagnosis not present

## 2022-04-21 DIAGNOSIS — D631 Anemia in chronic kidney disease: Secondary | ICD-10-CM | POA: Diagnosis not present

## 2022-04-21 DIAGNOSIS — N186 End stage renal disease: Secondary | ICD-10-CM | POA: Diagnosis not present

## 2022-04-21 DIAGNOSIS — I82409 Acute embolism and thrombosis of unspecified deep veins of unspecified lower extremity: Secondary | ICD-10-CM | POA: Diagnosis not present

## 2022-04-23 DIAGNOSIS — Z23 Encounter for immunization: Secondary | ICD-10-CM | POA: Diagnosis not present

## 2022-04-23 DIAGNOSIS — D631 Anemia in chronic kidney disease: Secondary | ICD-10-CM | POA: Diagnosis not present

## 2022-04-23 DIAGNOSIS — N186 End stage renal disease: Secondary | ICD-10-CM | POA: Diagnosis not present

## 2022-04-23 DIAGNOSIS — N2581 Secondary hyperparathyroidism of renal origin: Secondary | ICD-10-CM | POA: Diagnosis not present

## 2022-04-23 DIAGNOSIS — D509 Iron deficiency anemia, unspecified: Secondary | ICD-10-CM | POA: Diagnosis not present

## 2022-04-25 DIAGNOSIS — D509 Iron deficiency anemia, unspecified: Secondary | ICD-10-CM | POA: Diagnosis not present

## 2022-04-25 DIAGNOSIS — Z23 Encounter for immunization: Secondary | ICD-10-CM | POA: Diagnosis not present

## 2022-04-25 DIAGNOSIS — N186 End stage renal disease: Secondary | ICD-10-CM | POA: Diagnosis not present

## 2022-04-25 DIAGNOSIS — D631 Anemia in chronic kidney disease: Secondary | ICD-10-CM | POA: Diagnosis not present

## 2022-04-25 DIAGNOSIS — N2581 Secondary hyperparathyroidism of renal origin: Secondary | ICD-10-CM | POA: Diagnosis not present

## 2022-04-28 DIAGNOSIS — N186 End stage renal disease: Secondary | ICD-10-CM | POA: Diagnosis not present

## 2022-04-28 DIAGNOSIS — I82409 Acute embolism and thrombosis of unspecified deep veins of unspecified lower extremity: Secondary | ICD-10-CM | POA: Diagnosis not present

## 2022-04-28 DIAGNOSIS — N2581 Secondary hyperparathyroidism of renal origin: Secondary | ICD-10-CM | POA: Diagnosis not present

## 2022-04-28 DIAGNOSIS — Z23 Encounter for immunization: Secondary | ICD-10-CM | POA: Diagnosis not present

## 2022-04-28 DIAGNOSIS — D509 Iron deficiency anemia, unspecified: Secondary | ICD-10-CM | POA: Diagnosis not present

## 2022-04-28 DIAGNOSIS — D631 Anemia in chronic kidney disease: Secondary | ICD-10-CM | POA: Diagnosis not present

## 2022-04-30 DIAGNOSIS — N186 End stage renal disease: Secondary | ICD-10-CM | POA: Diagnosis not present

## 2022-04-30 DIAGNOSIS — N2581 Secondary hyperparathyroidism of renal origin: Secondary | ICD-10-CM | POA: Diagnosis not present

## 2022-04-30 DIAGNOSIS — Z23 Encounter for immunization: Secondary | ICD-10-CM | POA: Diagnosis not present

## 2022-04-30 DIAGNOSIS — D509 Iron deficiency anemia, unspecified: Secondary | ICD-10-CM | POA: Diagnosis not present

## 2022-04-30 DIAGNOSIS — D631 Anemia in chronic kidney disease: Secondary | ICD-10-CM | POA: Diagnosis not present

## 2022-05-02 DIAGNOSIS — D509 Iron deficiency anemia, unspecified: Secondary | ICD-10-CM | POA: Diagnosis not present

## 2022-05-02 DIAGNOSIS — Z23 Encounter for immunization: Secondary | ICD-10-CM | POA: Diagnosis not present

## 2022-05-02 DIAGNOSIS — D631 Anemia in chronic kidney disease: Secondary | ICD-10-CM | POA: Diagnosis not present

## 2022-05-02 DIAGNOSIS — N2581 Secondary hyperparathyroidism of renal origin: Secondary | ICD-10-CM | POA: Diagnosis not present

## 2022-05-02 DIAGNOSIS — N186 End stage renal disease: Secondary | ICD-10-CM | POA: Diagnosis not present

## 2022-05-05 DIAGNOSIS — I82409 Acute embolism and thrombosis of unspecified deep veins of unspecified lower extremity: Secondary | ICD-10-CM | POA: Diagnosis not present

## 2022-05-05 DIAGNOSIS — D631 Anemia in chronic kidney disease: Secondary | ICD-10-CM | POA: Diagnosis not present

## 2022-05-05 DIAGNOSIS — D509 Iron deficiency anemia, unspecified: Secondary | ICD-10-CM | POA: Diagnosis not present

## 2022-05-05 DIAGNOSIS — N2581 Secondary hyperparathyroidism of renal origin: Secondary | ICD-10-CM | POA: Diagnosis not present

## 2022-05-05 DIAGNOSIS — Z23 Encounter for immunization: Secondary | ICD-10-CM | POA: Diagnosis not present

## 2022-05-05 DIAGNOSIS — N186 End stage renal disease: Secondary | ICD-10-CM | POA: Diagnosis not present

## 2022-05-06 DIAGNOSIS — Z992 Dependence on renal dialysis: Secondary | ICD-10-CM | POA: Diagnosis not present

## 2022-05-06 DIAGNOSIS — N186 End stage renal disease: Secondary | ICD-10-CM | POA: Diagnosis not present

## 2022-05-07 DIAGNOSIS — E119 Type 2 diabetes mellitus without complications: Secondary | ICD-10-CM | POA: Diagnosis not present

## 2022-05-07 DIAGNOSIS — N2581 Secondary hyperparathyroidism of renal origin: Secondary | ICD-10-CM | POA: Diagnosis not present

## 2022-05-07 DIAGNOSIS — D509 Iron deficiency anemia, unspecified: Secondary | ICD-10-CM | POA: Diagnosis not present

## 2022-05-07 DIAGNOSIS — N186 End stage renal disease: Secondary | ICD-10-CM | POA: Diagnosis not present

## 2022-05-07 DIAGNOSIS — D631 Anemia in chronic kidney disease: Secondary | ICD-10-CM | POA: Diagnosis not present

## 2022-05-09 DIAGNOSIS — N186 End stage renal disease: Secondary | ICD-10-CM | POA: Diagnosis not present

## 2022-05-09 DIAGNOSIS — D509 Iron deficiency anemia, unspecified: Secondary | ICD-10-CM | POA: Diagnosis not present

## 2022-05-09 DIAGNOSIS — N2581 Secondary hyperparathyroidism of renal origin: Secondary | ICD-10-CM | POA: Diagnosis not present

## 2022-05-09 DIAGNOSIS — D631 Anemia in chronic kidney disease: Secondary | ICD-10-CM | POA: Diagnosis not present

## 2022-05-12 DIAGNOSIS — D509 Iron deficiency anemia, unspecified: Secondary | ICD-10-CM | POA: Diagnosis not present

## 2022-05-12 DIAGNOSIS — N186 End stage renal disease: Secondary | ICD-10-CM | POA: Diagnosis not present

## 2022-05-12 DIAGNOSIS — D631 Anemia in chronic kidney disease: Secondary | ICD-10-CM | POA: Diagnosis not present

## 2022-05-12 DIAGNOSIS — I82409 Acute embolism and thrombosis of unspecified deep veins of unspecified lower extremity: Secondary | ICD-10-CM | POA: Diagnosis not present

## 2022-05-12 DIAGNOSIS — N2581 Secondary hyperparathyroidism of renal origin: Secondary | ICD-10-CM | POA: Diagnosis not present

## 2022-05-14 DIAGNOSIS — N2581 Secondary hyperparathyroidism of renal origin: Secondary | ICD-10-CM | POA: Diagnosis not present

## 2022-05-14 DIAGNOSIS — D631 Anemia in chronic kidney disease: Secondary | ICD-10-CM | POA: Diagnosis not present

## 2022-05-14 DIAGNOSIS — D509 Iron deficiency anemia, unspecified: Secondary | ICD-10-CM | POA: Diagnosis not present

## 2022-05-14 DIAGNOSIS — N186 End stage renal disease: Secondary | ICD-10-CM | POA: Diagnosis not present

## 2022-05-15 DIAGNOSIS — Z79899 Other long term (current) drug therapy: Secondary | ICD-10-CM | POA: Diagnosis not present

## 2022-05-15 DIAGNOSIS — H401131 Primary open-angle glaucoma, bilateral, mild stage: Secondary | ICD-10-CM | POA: Diagnosis not present

## 2022-05-16 DIAGNOSIS — N186 End stage renal disease: Secondary | ICD-10-CM | POA: Diagnosis not present

## 2022-05-16 DIAGNOSIS — D631 Anemia in chronic kidney disease: Secondary | ICD-10-CM | POA: Diagnosis not present

## 2022-05-16 DIAGNOSIS — D509 Iron deficiency anemia, unspecified: Secondary | ICD-10-CM | POA: Diagnosis not present

## 2022-05-16 DIAGNOSIS — N2581 Secondary hyperparathyroidism of renal origin: Secondary | ICD-10-CM | POA: Diagnosis not present

## 2022-05-19 DIAGNOSIS — D631 Anemia in chronic kidney disease: Secondary | ICD-10-CM | POA: Diagnosis not present

## 2022-05-19 DIAGNOSIS — D509 Iron deficiency anemia, unspecified: Secondary | ICD-10-CM | POA: Diagnosis not present

## 2022-05-19 DIAGNOSIS — N186 End stage renal disease: Secondary | ICD-10-CM | POA: Diagnosis not present

## 2022-05-19 DIAGNOSIS — N2581 Secondary hyperparathyroidism of renal origin: Secondary | ICD-10-CM | POA: Diagnosis not present

## 2022-05-19 DIAGNOSIS — I82409 Acute embolism and thrombosis of unspecified deep veins of unspecified lower extremity: Secondary | ICD-10-CM | POA: Diagnosis not present

## 2022-05-21 DIAGNOSIS — N186 End stage renal disease: Secondary | ICD-10-CM | POA: Diagnosis not present

## 2022-05-21 DIAGNOSIS — N2581 Secondary hyperparathyroidism of renal origin: Secondary | ICD-10-CM | POA: Diagnosis not present

## 2022-05-21 DIAGNOSIS — D631 Anemia in chronic kidney disease: Secondary | ICD-10-CM | POA: Diagnosis not present

## 2022-05-21 DIAGNOSIS — D509 Iron deficiency anemia, unspecified: Secondary | ICD-10-CM | POA: Diagnosis not present

## 2022-05-23 DIAGNOSIS — N2581 Secondary hyperparathyroidism of renal origin: Secondary | ICD-10-CM | POA: Diagnosis not present

## 2022-05-23 DIAGNOSIS — D631 Anemia in chronic kidney disease: Secondary | ICD-10-CM | POA: Diagnosis not present

## 2022-05-23 DIAGNOSIS — N186 End stage renal disease: Secondary | ICD-10-CM | POA: Diagnosis not present

## 2022-05-23 DIAGNOSIS — D509 Iron deficiency anemia, unspecified: Secondary | ICD-10-CM | POA: Diagnosis not present

## 2022-05-26 DIAGNOSIS — D509 Iron deficiency anemia, unspecified: Secondary | ICD-10-CM | POA: Diagnosis not present

## 2022-05-26 DIAGNOSIS — N186 End stage renal disease: Secondary | ICD-10-CM | POA: Diagnosis not present

## 2022-05-26 DIAGNOSIS — D631 Anemia in chronic kidney disease: Secondary | ICD-10-CM | POA: Diagnosis not present

## 2022-05-26 DIAGNOSIS — N2581 Secondary hyperparathyroidism of renal origin: Secondary | ICD-10-CM | POA: Diagnosis not present

## 2022-05-26 DIAGNOSIS — I82409 Acute embolism and thrombosis of unspecified deep veins of unspecified lower extremity: Secondary | ICD-10-CM | POA: Diagnosis not present

## 2022-05-28 DIAGNOSIS — N186 End stage renal disease: Secondary | ICD-10-CM | POA: Diagnosis not present

## 2022-05-28 DIAGNOSIS — N2581 Secondary hyperparathyroidism of renal origin: Secondary | ICD-10-CM | POA: Diagnosis not present

## 2022-05-28 DIAGNOSIS — D631 Anemia in chronic kidney disease: Secondary | ICD-10-CM | POA: Diagnosis not present

## 2022-05-28 DIAGNOSIS — D509 Iron deficiency anemia, unspecified: Secondary | ICD-10-CM | POA: Diagnosis not present

## 2022-05-30 DIAGNOSIS — D509 Iron deficiency anemia, unspecified: Secondary | ICD-10-CM | POA: Diagnosis not present

## 2022-05-30 DIAGNOSIS — D631 Anemia in chronic kidney disease: Secondary | ICD-10-CM | POA: Diagnosis not present

## 2022-05-30 DIAGNOSIS — N2581 Secondary hyperparathyroidism of renal origin: Secondary | ICD-10-CM | POA: Diagnosis not present

## 2022-05-30 DIAGNOSIS — N186 End stage renal disease: Secondary | ICD-10-CM | POA: Diagnosis not present

## 2022-06-02 DIAGNOSIS — D509 Iron deficiency anemia, unspecified: Secondary | ICD-10-CM | POA: Diagnosis not present

## 2022-06-02 DIAGNOSIS — N186 End stage renal disease: Secondary | ICD-10-CM | POA: Diagnosis not present

## 2022-06-02 DIAGNOSIS — I82409 Acute embolism and thrombosis of unspecified deep veins of unspecified lower extremity: Secondary | ICD-10-CM | POA: Diagnosis not present

## 2022-06-02 DIAGNOSIS — D631 Anemia in chronic kidney disease: Secondary | ICD-10-CM | POA: Diagnosis not present

## 2022-06-02 DIAGNOSIS — N2581 Secondary hyperparathyroidism of renal origin: Secondary | ICD-10-CM | POA: Diagnosis not present

## 2022-06-04 DIAGNOSIS — D509 Iron deficiency anemia, unspecified: Secondary | ICD-10-CM | POA: Diagnosis not present

## 2022-06-04 DIAGNOSIS — D631 Anemia in chronic kidney disease: Secondary | ICD-10-CM | POA: Diagnosis not present

## 2022-06-04 DIAGNOSIS — N186 End stage renal disease: Secondary | ICD-10-CM | POA: Diagnosis not present

## 2022-06-04 DIAGNOSIS — N2581 Secondary hyperparathyroidism of renal origin: Secondary | ICD-10-CM | POA: Diagnosis not present

## 2022-06-06 DIAGNOSIS — D631 Anemia in chronic kidney disease: Secondary | ICD-10-CM | POA: Diagnosis not present

## 2022-06-06 DIAGNOSIS — Z992 Dependence on renal dialysis: Secondary | ICD-10-CM | POA: Diagnosis not present

## 2022-06-06 DIAGNOSIS — D509 Iron deficiency anemia, unspecified: Secondary | ICD-10-CM | POA: Diagnosis not present

## 2022-06-06 DIAGNOSIS — N186 End stage renal disease: Secondary | ICD-10-CM | POA: Diagnosis not present

## 2022-06-06 DIAGNOSIS — N2581 Secondary hyperparathyroidism of renal origin: Secondary | ICD-10-CM | POA: Diagnosis not present

## 2022-06-09 DIAGNOSIS — I82409 Acute embolism and thrombosis of unspecified deep veins of unspecified lower extremity: Secondary | ICD-10-CM | POA: Diagnosis not present

## 2022-06-09 DIAGNOSIS — N2581 Secondary hyperparathyroidism of renal origin: Secondary | ICD-10-CM | POA: Diagnosis not present

## 2022-06-09 DIAGNOSIS — N186 End stage renal disease: Secondary | ICD-10-CM | POA: Diagnosis not present

## 2022-06-09 DIAGNOSIS — D509 Iron deficiency anemia, unspecified: Secondary | ICD-10-CM | POA: Diagnosis not present

## 2022-06-09 DIAGNOSIS — D631 Anemia in chronic kidney disease: Secondary | ICD-10-CM | POA: Diagnosis not present

## 2022-06-10 ENCOUNTER — Encounter: Payer: Self-pay | Admitting: Family Medicine

## 2022-06-10 ENCOUNTER — Ambulatory Visit (INDEPENDENT_AMBULATORY_CARE_PROVIDER_SITE_OTHER): Payer: Medicare HMO | Admitting: Family Medicine

## 2022-06-10 VITALS — BP 138/61 | HR 79 | Wt 147.0 lb

## 2022-06-10 DIAGNOSIS — I10 Essential (primary) hypertension: Secondary | ICD-10-CM

## 2022-06-10 DIAGNOSIS — M533 Sacrococcygeal disorders, not elsewhere classified: Secondary | ICD-10-CM

## 2022-06-10 DIAGNOSIS — G8929 Other chronic pain: Secondary | ICD-10-CM

## 2022-06-10 DIAGNOSIS — M5416 Radiculopathy, lumbar region: Secondary | ICD-10-CM | POA: Diagnosis not present

## 2022-06-10 NOTE — Patient Instructions (Signed)
Please get your Tdap done at the pharmacy, it is free with your Medicare at the pharmac.

## 2022-06-10 NOTE — Progress Notes (Signed)
Established Patient Office Visit  Subjective   Patient ID: Joy Anderson, female    DOB: 1950/03/05  Age: 72 y.o. MRN: [id number]  Chief Complaint  Patient presents with   leg soreness    HPI She is here today to discuss some bilateral low back pain she says not even so much as the pain is it is a soreness.  She sometimes will get pain that radiates down into both outer thighs.  Sometimes even down to her ankles.  It happens sometimes a couple times a week symptoms can be 3 or 4 times a week or sometimes she can go most an entire week and it does not bother her.  She also gets some intermittent left groin Prange she has had bilateral hip replacements.  She says sometimes her legs just feel weak and most heavy.  Especially if she is been walking for an hour or so.  She denies any leg cramps.  Denies any swelling of her lower extremities or discoloration of her feet.  She occasionally will take Tylenol for her back and legs and that does help some.  Nephrology dropper her pain because her blood pressure was dropping after dialysis.    ROS    Objective:     BP 138/61   Pulse 79   Wt 147 lb (66.7 kg)   LMP 03/18/1999   SpO2 97%   BMI 27.33 kg/m    Physical Exam Vitals reviewed.  Constitutional:      Appearance: She is well-developed.  HENT:     Head: Normocephalic and atraumatic.  Eyes:     Conjunctiva/sclera: Conjunctivae normal.  Cardiovascular:     Rate and Rhythm: Normal rate.  Pulmonary:     Effort: Pulmonary effort is normal.  Musculoskeletal:     Comments: Non tender over the spine. Tender over both SI joint, more on the left.  Normal lumbar flexion, extension, rotation, and side bending.  Pian with extension.  Hip and knee strength 5/5.   Skin:    General: Skin is dry.     Coloration: Skin is not pale.  Neurological:     Mental Status: She is alert and oriented to person, place, and time.  Psychiatric:        Behavior: Behavior normal.      No results  found for any visits on 06/10/22.    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  Or possible spinal stenosis as she also gets a weakness in her legs with prolonged walking.  Consider peripheral vascular disease on the differential as well but she is not having any discoloration of her feet or legs or swelling and no claudication at this point.   Continue to use Tylenol PRN.    No  follow-ups on file.    Nani Gasser, MD

## 2022-06-10 NOTE — Assessment & Plan Note (Signed)
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

## 2022-06-11 DIAGNOSIS — D631 Anemia in chronic kidney disease: Secondary | ICD-10-CM | POA: Diagnosis not present

## 2022-06-11 DIAGNOSIS — N186 End stage renal disease: Secondary | ICD-10-CM | POA: Diagnosis not present

## 2022-06-11 DIAGNOSIS — N2581 Secondary hyperparathyroidism of renal origin: Secondary | ICD-10-CM | POA: Diagnosis not present

## 2022-06-11 DIAGNOSIS — D509 Iron deficiency anemia, unspecified: Secondary | ICD-10-CM | POA: Diagnosis not present

## 2022-06-11 DIAGNOSIS — E119 Type 2 diabetes mellitus without complications: Secondary | ICD-10-CM | POA: Diagnosis not present

## 2022-06-13 DIAGNOSIS — D631 Anemia in chronic kidney disease: Secondary | ICD-10-CM | POA: Diagnosis not present

## 2022-06-13 DIAGNOSIS — N186 End stage renal disease: Secondary | ICD-10-CM | POA: Diagnosis not present

## 2022-06-13 DIAGNOSIS — N2581 Secondary hyperparathyroidism of renal origin: Secondary | ICD-10-CM | POA: Diagnosis not present

## 2022-06-13 DIAGNOSIS — D509 Iron deficiency anemia, unspecified: Secondary | ICD-10-CM | POA: Diagnosis not present

## 2022-06-16 DIAGNOSIS — I82409 Acute embolism and thrombosis of unspecified deep veins of unspecified lower extremity: Secondary | ICD-10-CM | POA: Diagnosis not present

## 2022-06-16 DIAGNOSIS — D509 Iron deficiency anemia, unspecified: Secondary | ICD-10-CM | POA: Diagnosis not present

## 2022-06-16 DIAGNOSIS — N186 End stage renal disease: Secondary | ICD-10-CM | POA: Diagnosis not present

## 2022-06-16 DIAGNOSIS — N2581 Secondary hyperparathyroidism of renal origin: Secondary | ICD-10-CM | POA: Diagnosis not present

## 2022-06-16 DIAGNOSIS — D631 Anemia in chronic kidney disease: Secondary | ICD-10-CM | POA: Diagnosis not present

## 2022-06-17 DIAGNOSIS — H401134 Primary open-angle glaucoma, bilateral, indeterminate stage: Secondary | ICD-10-CM | POA: Diagnosis not present

## 2022-06-17 DIAGNOSIS — Z961 Presence of intraocular lens: Secondary | ICD-10-CM | POA: Diagnosis not present

## 2022-06-17 DIAGNOSIS — H43813 Vitreous degeneration, bilateral: Secondary | ICD-10-CM | POA: Diagnosis not present

## 2022-06-17 DIAGNOSIS — Z79899 Other long term (current) drug therapy: Secondary | ICD-10-CM | POA: Diagnosis not present

## 2022-06-17 DIAGNOSIS — H526 Other disorders of refraction: Secondary | ICD-10-CM | POA: Diagnosis not present

## 2022-06-18 DIAGNOSIS — D509 Iron deficiency anemia, unspecified: Secondary | ICD-10-CM | POA: Diagnosis not present

## 2022-06-18 DIAGNOSIS — N186 End stage renal disease: Secondary | ICD-10-CM | POA: Diagnosis not present

## 2022-06-18 DIAGNOSIS — D631 Anemia in chronic kidney disease: Secondary | ICD-10-CM | POA: Diagnosis not present

## 2022-06-18 DIAGNOSIS — N2581 Secondary hyperparathyroidism of renal origin: Secondary | ICD-10-CM | POA: Diagnosis not present

## 2022-06-20 DIAGNOSIS — D631 Anemia in chronic kidney disease: Secondary | ICD-10-CM | POA: Diagnosis not present

## 2022-06-20 DIAGNOSIS — N2581 Secondary hyperparathyroidism of renal origin: Secondary | ICD-10-CM | POA: Diagnosis not present

## 2022-06-20 DIAGNOSIS — D509 Iron deficiency anemia, unspecified: Secondary | ICD-10-CM | POA: Diagnosis not present

## 2022-06-20 DIAGNOSIS — N186 End stage renal disease: Secondary | ICD-10-CM | POA: Diagnosis not present

## 2022-06-23 DIAGNOSIS — D509 Iron deficiency anemia, unspecified: Secondary | ICD-10-CM | POA: Diagnosis not present

## 2022-06-23 DIAGNOSIS — I82409 Acute embolism and thrombosis of unspecified deep veins of unspecified lower extremity: Secondary | ICD-10-CM | POA: Diagnosis not present

## 2022-06-23 DIAGNOSIS — N186 End stage renal disease: Secondary | ICD-10-CM | POA: Diagnosis not present

## 2022-06-23 DIAGNOSIS — D631 Anemia in chronic kidney disease: Secondary | ICD-10-CM | POA: Diagnosis not present

## 2022-06-23 DIAGNOSIS — N2581 Secondary hyperparathyroidism of renal origin: Secondary | ICD-10-CM | POA: Diagnosis not present

## 2022-06-25 DIAGNOSIS — D631 Anemia in chronic kidney disease: Secondary | ICD-10-CM | POA: Diagnosis not present

## 2022-06-25 DIAGNOSIS — D509 Iron deficiency anemia, unspecified: Secondary | ICD-10-CM | POA: Diagnosis not present

## 2022-06-25 DIAGNOSIS — N186 End stage renal disease: Secondary | ICD-10-CM | POA: Diagnosis not present

## 2022-06-25 DIAGNOSIS — N2581 Secondary hyperparathyroidism of renal origin: Secondary | ICD-10-CM | POA: Diagnosis not present

## 2022-06-27 DIAGNOSIS — N186 End stage renal disease: Secondary | ICD-10-CM | POA: Diagnosis not present

## 2022-06-27 DIAGNOSIS — D631 Anemia in chronic kidney disease: Secondary | ICD-10-CM | POA: Diagnosis not present

## 2022-06-27 DIAGNOSIS — N2581 Secondary hyperparathyroidism of renal origin: Secondary | ICD-10-CM | POA: Diagnosis not present

## 2022-06-27 DIAGNOSIS — D509 Iron deficiency anemia, unspecified: Secondary | ICD-10-CM | POA: Diagnosis not present

## 2022-06-30 DIAGNOSIS — D509 Iron deficiency anemia, unspecified: Secondary | ICD-10-CM | POA: Diagnosis not present

## 2022-06-30 DIAGNOSIS — N186 End stage renal disease: Secondary | ICD-10-CM | POA: Diagnosis not present

## 2022-06-30 DIAGNOSIS — N2581 Secondary hyperparathyroidism of renal origin: Secondary | ICD-10-CM | POA: Diagnosis not present

## 2022-06-30 DIAGNOSIS — D631 Anemia in chronic kidney disease: Secondary | ICD-10-CM | POA: Diagnosis not present

## 2022-06-30 DIAGNOSIS — I82409 Acute embolism and thrombosis of unspecified deep veins of unspecified lower extremity: Secondary | ICD-10-CM | POA: Diagnosis not present

## 2022-07-02 DIAGNOSIS — N186 End stage renal disease: Secondary | ICD-10-CM | POA: Diagnosis not present

## 2022-07-02 DIAGNOSIS — D509 Iron deficiency anemia, unspecified: Secondary | ICD-10-CM | POA: Diagnosis not present

## 2022-07-02 DIAGNOSIS — D631 Anemia in chronic kidney disease: Secondary | ICD-10-CM | POA: Diagnosis not present

## 2022-07-02 DIAGNOSIS — N2581 Secondary hyperparathyroidism of renal origin: Secondary | ICD-10-CM | POA: Diagnosis not present

## 2022-07-04 DIAGNOSIS — D631 Anemia in chronic kidney disease: Secondary | ICD-10-CM | POA: Diagnosis not present

## 2022-07-04 DIAGNOSIS — N186 End stage renal disease: Secondary | ICD-10-CM | POA: Diagnosis not present

## 2022-07-04 DIAGNOSIS — N2581 Secondary hyperparathyroidism of renal origin: Secondary | ICD-10-CM | POA: Diagnosis not present

## 2022-07-04 DIAGNOSIS — D509 Iron deficiency anemia, unspecified: Secondary | ICD-10-CM | POA: Diagnosis not present

## 2022-07-06 DIAGNOSIS — N186 End stage renal disease: Secondary | ICD-10-CM | POA: Diagnosis not present

## 2022-07-06 DIAGNOSIS — Z992 Dependence on renal dialysis: Secondary | ICD-10-CM | POA: Diagnosis not present

## 2022-07-07 DIAGNOSIS — N2581 Secondary hyperparathyroidism of renal origin: Secondary | ICD-10-CM | POA: Diagnosis not present

## 2022-07-07 DIAGNOSIS — I82409 Acute embolism and thrombosis of unspecified deep veins of unspecified lower extremity: Secondary | ICD-10-CM | POA: Diagnosis not present

## 2022-07-07 DIAGNOSIS — D631 Anemia in chronic kidney disease: Secondary | ICD-10-CM | POA: Diagnosis not present

## 2022-07-07 DIAGNOSIS — N186 End stage renal disease: Secondary | ICD-10-CM | POA: Diagnosis not present

## 2022-07-07 DIAGNOSIS — D509 Iron deficiency anemia, unspecified: Secondary | ICD-10-CM | POA: Diagnosis not present

## 2022-07-09 DIAGNOSIS — N186 End stage renal disease: Secondary | ICD-10-CM | POA: Diagnosis not present

## 2022-07-09 DIAGNOSIS — D631 Anemia in chronic kidney disease: Secondary | ICD-10-CM | POA: Diagnosis not present

## 2022-07-09 DIAGNOSIS — D509 Iron deficiency anemia, unspecified: Secondary | ICD-10-CM | POA: Diagnosis not present

## 2022-07-09 DIAGNOSIS — N2581 Secondary hyperparathyroidism of renal origin: Secondary | ICD-10-CM | POA: Diagnosis not present

## 2022-07-11 DIAGNOSIS — N186 End stage renal disease: Secondary | ICD-10-CM | POA: Diagnosis not present

## 2022-07-11 DIAGNOSIS — N2581 Secondary hyperparathyroidism of renal origin: Secondary | ICD-10-CM | POA: Diagnosis not present

## 2022-07-11 DIAGNOSIS — D631 Anemia in chronic kidney disease: Secondary | ICD-10-CM | POA: Diagnosis not present

## 2022-07-11 DIAGNOSIS — D509 Iron deficiency anemia, unspecified: Secondary | ICD-10-CM | POA: Diagnosis not present

## 2022-07-14 DIAGNOSIS — D509 Iron deficiency anemia, unspecified: Secondary | ICD-10-CM | POA: Diagnosis not present

## 2022-07-14 DIAGNOSIS — N186 End stage renal disease: Secondary | ICD-10-CM | POA: Diagnosis not present

## 2022-07-14 DIAGNOSIS — D631 Anemia in chronic kidney disease: Secondary | ICD-10-CM | POA: Diagnosis not present

## 2022-07-14 DIAGNOSIS — N2581 Secondary hyperparathyroidism of renal origin: Secondary | ICD-10-CM | POA: Diagnosis not present

## 2022-07-14 DIAGNOSIS — I82409 Acute embolism and thrombosis of unspecified deep veins of unspecified lower extremity: Secondary | ICD-10-CM | POA: Diagnosis not present

## 2022-07-16 DIAGNOSIS — D631 Anemia in chronic kidney disease: Secondary | ICD-10-CM | POA: Diagnosis not present

## 2022-07-16 DIAGNOSIS — E119 Type 2 diabetes mellitus without complications: Secondary | ICD-10-CM | POA: Diagnosis not present

## 2022-07-16 DIAGNOSIS — N186 End stage renal disease: Secondary | ICD-10-CM | POA: Diagnosis not present

## 2022-07-16 DIAGNOSIS — N2581 Secondary hyperparathyroidism of renal origin: Secondary | ICD-10-CM | POA: Diagnosis not present

## 2022-07-16 DIAGNOSIS — D509 Iron deficiency anemia, unspecified: Secondary | ICD-10-CM | POA: Diagnosis not present

## 2022-07-18 DIAGNOSIS — D509 Iron deficiency anemia, unspecified: Secondary | ICD-10-CM | POA: Diagnosis not present

## 2022-07-18 DIAGNOSIS — N2581 Secondary hyperparathyroidism of renal origin: Secondary | ICD-10-CM | POA: Diagnosis not present

## 2022-07-18 DIAGNOSIS — D631 Anemia in chronic kidney disease: Secondary | ICD-10-CM | POA: Diagnosis not present

## 2022-07-18 DIAGNOSIS — N186 End stage renal disease: Secondary | ICD-10-CM | POA: Diagnosis not present

## 2022-07-21 DIAGNOSIS — N186 End stage renal disease: Secondary | ICD-10-CM | POA: Diagnosis not present

## 2022-07-21 DIAGNOSIS — D509 Iron deficiency anemia, unspecified: Secondary | ICD-10-CM | POA: Diagnosis not present

## 2022-07-21 DIAGNOSIS — D631 Anemia in chronic kidney disease: Secondary | ICD-10-CM | POA: Diagnosis not present

## 2022-07-21 DIAGNOSIS — I82409 Acute embolism and thrombosis of unspecified deep veins of unspecified lower extremity: Secondary | ICD-10-CM | POA: Diagnosis not present

## 2022-07-21 DIAGNOSIS — N2581 Secondary hyperparathyroidism of renal origin: Secondary | ICD-10-CM | POA: Diagnosis not present

## 2022-07-22 DIAGNOSIS — H401131 Primary open-angle glaucoma, bilateral, mild stage: Secondary | ICD-10-CM | POA: Diagnosis not present

## 2022-07-23 DIAGNOSIS — D509 Iron deficiency anemia, unspecified: Secondary | ICD-10-CM | POA: Diagnosis not present

## 2022-07-23 DIAGNOSIS — D631 Anemia in chronic kidney disease: Secondary | ICD-10-CM | POA: Diagnosis not present

## 2022-07-23 DIAGNOSIS — N2581 Secondary hyperparathyroidism of renal origin: Secondary | ICD-10-CM | POA: Diagnosis not present

## 2022-07-23 DIAGNOSIS — N186 End stage renal disease: Secondary | ICD-10-CM | POA: Diagnosis not present

## 2022-07-24 DIAGNOSIS — N186 End stage renal disease: Secondary | ICD-10-CM | POA: Diagnosis not present

## 2022-07-24 DIAGNOSIS — M329 Systemic lupus erythematosus, unspecified: Secondary | ICD-10-CM | POA: Diagnosis not present

## 2022-07-24 DIAGNOSIS — Z79899 Other long term (current) drug therapy: Secondary | ICD-10-CM | POA: Diagnosis not present

## 2022-07-25 DIAGNOSIS — D509 Iron deficiency anemia, unspecified: Secondary | ICD-10-CM | POA: Diagnosis not present

## 2022-07-25 DIAGNOSIS — N186 End stage renal disease: Secondary | ICD-10-CM | POA: Diagnosis not present

## 2022-07-25 DIAGNOSIS — N2581 Secondary hyperparathyroidism of renal origin: Secondary | ICD-10-CM | POA: Diagnosis not present

## 2022-07-25 DIAGNOSIS — D631 Anemia in chronic kidney disease: Secondary | ICD-10-CM | POA: Diagnosis not present

## 2022-07-28 DIAGNOSIS — I82409 Acute embolism and thrombosis of unspecified deep veins of unspecified lower extremity: Secondary | ICD-10-CM | POA: Diagnosis not present

## 2022-07-28 DIAGNOSIS — D509 Iron deficiency anemia, unspecified: Secondary | ICD-10-CM | POA: Diagnosis not present

## 2022-07-28 DIAGNOSIS — N2581 Secondary hyperparathyroidism of renal origin: Secondary | ICD-10-CM | POA: Diagnosis not present

## 2022-07-28 DIAGNOSIS — N186 End stage renal disease: Secondary | ICD-10-CM | POA: Diagnosis not present

## 2022-07-28 DIAGNOSIS — D631 Anemia in chronic kidney disease: Secondary | ICD-10-CM | POA: Diagnosis not present

## 2022-07-30 DIAGNOSIS — N2581 Secondary hyperparathyroidism of renal origin: Secondary | ICD-10-CM | POA: Diagnosis not present

## 2022-07-30 DIAGNOSIS — D631 Anemia in chronic kidney disease: Secondary | ICD-10-CM | POA: Diagnosis not present

## 2022-07-30 DIAGNOSIS — N186 End stage renal disease: Secondary | ICD-10-CM | POA: Diagnosis not present

## 2022-07-30 DIAGNOSIS — D509 Iron deficiency anemia, unspecified: Secondary | ICD-10-CM | POA: Diagnosis not present

## 2022-08-01 DIAGNOSIS — N2581 Secondary hyperparathyroidism of renal origin: Secondary | ICD-10-CM | POA: Diagnosis not present

## 2022-08-01 DIAGNOSIS — D631 Anemia in chronic kidney disease: Secondary | ICD-10-CM | POA: Diagnosis not present

## 2022-08-01 DIAGNOSIS — D509 Iron deficiency anemia, unspecified: Secondary | ICD-10-CM | POA: Diagnosis not present

## 2022-08-01 DIAGNOSIS — N186 End stage renal disease: Secondary | ICD-10-CM | POA: Diagnosis not present

## 2022-08-04 DIAGNOSIS — I82409 Acute embolism and thrombosis of unspecified deep veins of unspecified lower extremity: Secondary | ICD-10-CM | POA: Diagnosis not present

## 2022-08-04 DIAGNOSIS — D631 Anemia in chronic kidney disease: Secondary | ICD-10-CM | POA: Diagnosis not present

## 2022-08-04 DIAGNOSIS — D509 Iron deficiency anemia, unspecified: Secondary | ICD-10-CM | POA: Diagnosis not present

## 2022-08-04 DIAGNOSIS — N2581 Secondary hyperparathyroidism of renal origin: Secondary | ICD-10-CM | POA: Diagnosis not present

## 2022-08-04 DIAGNOSIS — N186 End stage renal disease: Secondary | ICD-10-CM | POA: Diagnosis not present

## 2022-08-06 DIAGNOSIS — N186 End stage renal disease: Secondary | ICD-10-CM | POA: Diagnosis not present

## 2022-08-06 DIAGNOSIS — D631 Anemia in chronic kidney disease: Secondary | ICD-10-CM | POA: Diagnosis not present

## 2022-08-06 DIAGNOSIS — D509 Iron deficiency anemia, unspecified: Secondary | ICD-10-CM | POA: Diagnosis not present

## 2022-08-06 DIAGNOSIS — Z992 Dependence on renal dialysis: Secondary | ICD-10-CM | POA: Diagnosis not present

## 2022-08-06 DIAGNOSIS — N2581 Secondary hyperparathyroidism of renal origin: Secondary | ICD-10-CM | POA: Diagnosis not present

## 2022-08-08 DIAGNOSIS — I959 Hypotension, unspecified: Secondary | ICD-10-CM | POA: Diagnosis not present

## 2022-08-08 DIAGNOSIS — D509 Iron deficiency anemia, unspecified: Secondary | ICD-10-CM | POA: Diagnosis not present

## 2022-08-08 DIAGNOSIS — D631 Anemia in chronic kidney disease: Secondary | ICD-10-CM | POA: Diagnosis not present

## 2022-08-08 DIAGNOSIS — N2581 Secondary hyperparathyroidism of renal origin: Secondary | ICD-10-CM | POA: Diagnosis not present

## 2022-08-08 DIAGNOSIS — R252 Cramp and spasm: Secondary | ICD-10-CM | POA: Diagnosis not present

## 2022-08-08 DIAGNOSIS — N186 End stage renal disease: Secondary | ICD-10-CM | POA: Diagnosis not present

## 2022-08-11 DIAGNOSIS — D509 Iron deficiency anemia, unspecified: Secondary | ICD-10-CM | POA: Diagnosis not present

## 2022-08-11 DIAGNOSIS — I959 Hypotension, unspecified: Secondary | ICD-10-CM | POA: Diagnosis not present

## 2022-08-11 DIAGNOSIS — D631 Anemia in chronic kidney disease: Secondary | ICD-10-CM | POA: Diagnosis not present

## 2022-08-11 DIAGNOSIS — N186 End stage renal disease: Secondary | ICD-10-CM | POA: Diagnosis not present

## 2022-08-11 DIAGNOSIS — I82409 Acute embolism and thrombosis of unspecified deep veins of unspecified lower extremity: Secondary | ICD-10-CM | POA: Diagnosis not present

## 2022-08-11 DIAGNOSIS — R252 Cramp and spasm: Secondary | ICD-10-CM | POA: Diagnosis not present

## 2022-08-11 DIAGNOSIS — N2581 Secondary hyperparathyroidism of renal origin: Secondary | ICD-10-CM | POA: Diagnosis not present

## 2022-08-13 DIAGNOSIS — E119 Type 2 diabetes mellitus without complications: Secondary | ICD-10-CM | POA: Diagnosis not present

## 2022-08-13 DIAGNOSIS — N186 End stage renal disease: Secondary | ICD-10-CM | POA: Diagnosis not present

## 2022-08-13 DIAGNOSIS — R252 Cramp and spasm: Secondary | ICD-10-CM | POA: Diagnosis not present

## 2022-08-13 DIAGNOSIS — D631 Anemia in chronic kidney disease: Secondary | ICD-10-CM | POA: Diagnosis not present

## 2022-08-13 DIAGNOSIS — D509 Iron deficiency anemia, unspecified: Secondary | ICD-10-CM | POA: Diagnosis not present

## 2022-08-13 DIAGNOSIS — I959 Hypotension, unspecified: Secondary | ICD-10-CM | POA: Diagnosis not present

## 2022-08-13 DIAGNOSIS — N2581 Secondary hyperparathyroidism of renal origin: Secondary | ICD-10-CM | POA: Diagnosis not present

## 2022-08-15 DIAGNOSIS — D509 Iron deficiency anemia, unspecified: Secondary | ICD-10-CM | POA: Diagnosis not present

## 2022-08-15 DIAGNOSIS — D631 Anemia in chronic kidney disease: Secondary | ICD-10-CM | POA: Diagnosis not present

## 2022-08-15 DIAGNOSIS — R252 Cramp and spasm: Secondary | ICD-10-CM | POA: Diagnosis not present

## 2022-08-15 DIAGNOSIS — N2581 Secondary hyperparathyroidism of renal origin: Secondary | ICD-10-CM | POA: Diagnosis not present

## 2022-08-15 DIAGNOSIS — I959 Hypotension, unspecified: Secondary | ICD-10-CM | POA: Diagnosis not present

## 2022-08-15 DIAGNOSIS — N186 End stage renal disease: Secondary | ICD-10-CM | POA: Diagnosis not present

## 2022-08-18 DIAGNOSIS — D631 Anemia in chronic kidney disease: Secondary | ICD-10-CM | POA: Diagnosis not present

## 2022-08-18 DIAGNOSIS — N2581 Secondary hyperparathyroidism of renal origin: Secondary | ICD-10-CM | POA: Diagnosis not present

## 2022-08-18 DIAGNOSIS — I82409 Acute embolism and thrombosis of unspecified deep veins of unspecified lower extremity: Secondary | ICD-10-CM | POA: Diagnosis not present

## 2022-08-18 DIAGNOSIS — N186 End stage renal disease: Secondary | ICD-10-CM | POA: Diagnosis not present

## 2022-08-18 DIAGNOSIS — D509 Iron deficiency anemia, unspecified: Secondary | ICD-10-CM | POA: Diagnosis not present

## 2022-08-18 DIAGNOSIS — I959 Hypotension, unspecified: Secondary | ICD-10-CM | POA: Diagnosis not present

## 2022-08-18 DIAGNOSIS — R252 Cramp and spasm: Secondary | ICD-10-CM | POA: Diagnosis not present

## 2022-08-20 DIAGNOSIS — D631 Anemia in chronic kidney disease: Secondary | ICD-10-CM | POA: Diagnosis not present

## 2022-08-20 DIAGNOSIS — N2581 Secondary hyperparathyroidism of renal origin: Secondary | ICD-10-CM | POA: Diagnosis not present

## 2022-08-20 DIAGNOSIS — D509 Iron deficiency anemia, unspecified: Secondary | ICD-10-CM | POA: Diagnosis not present

## 2022-08-20 DIAGNOSIS — N186 End stage renal disease: Secondary | ICD-10-CM | POA: Diagnosis not present

## 2022-08-20 DIAGNOSIS — I959 Hypotension, unspecified: Secondary | ICD-10-CM | POA: Diagnosis not present

## 2022-08-20 DIAGNOSIS — R252 Cramp and spasm: Secondary | ICD-10-CM | POA: Diagnosis not present

## 2022-08-22 DIAGNOSIS — N2581 Secondary hyperparathyroidism of renal origin: Secondary | ICD-10-CM | POA: Diagnosis not present

## 2022-08-22 DIAGNOSIS — N186 End stage renal disease: Secondary | ICD-10-CM | POA: Diagnosis not present

## 2022-08-22 DIAGNOSIS — R252 Cramp and spasm: Secondary | ICD-10-CM | POA: Diagnosis not present

## 2022-08-22 DIAGNOSIS — D631 Anemia in chronic kidney disease: Secondary | ICD-10-CM | POA: Diagnosis not present

## 2022-08-22 DIAGNOSIS — D509 Iron deficiency anemia, unspecified: Secondary | ICD-10-CM | POA: Diagnosis not present

## 2022-08-22 DIAGNOSIS — I959 Hypotension, unspecified: Secondary | ICD-10-CM | POA: Diagnosis not present

## 2022-08-25 DIAGNOSIS — D631 Anemia in chronic kidney disease: Secondary | ICD-10-CM | POA: Diagnosis not present

## 2022-08-25 DIAGNOSIS — I959 Hypotension, unspecified: Secondary | ICD-10-CM | POA: Diagnosis not present

## 2022-08-25 DIAGNOSIS — I82409 Acute embolism and thrombosis of unspecified deep veins of unspecified lower extremity: Secondary | ICD-10-CM | POA: Diagnosis not present

## 2022-08-25 DIAGNOSIS — N2581 Secondary hyperparathyroidism of renal origin: Secondary | ICD-10-CM | POA: Diagnosis not present

## 2022-08-25 DIAGNOSIS — D509 Iron deficiency anemia, unspecified: Secondary | ICD-10-CM | POA: Diagnosis not present

## 2022-08-25 DIAGNOSIS — N186 End stage renal disease: Secondary | ICD-10-CM | POA: Diagnosis not present

## 2022-08-25 DIAGNOSIS — R252 Cramp and spasm: Secondary | ICD-10-CM | POA: Diagnosis not present

## 2022-08-27 DIAGNOSIS — N186 End stage renal disease: Secondary | ICD-10-CM | POA: Diagnosis not present

## 2022-08-27 DIAGNOSIS — D509 Iron deficiency anemia, unspecified: Secondary | ICD-10-CM | POA: Diagnosis not present

## 2022-08-27 DIAGNOSIS — R252 Cramp and spasm: Secondary | ICD-10-CM | POA: Diagnosis not present

## 2022-08-27 DIAGNOSIS — N2581 Secondary hyperparathyroidism of renal origin: Secondary | ICD-10-CM | POA: Diagnosis not present

## 2022-08-27 DIAGNOSIS — I959 Hypotension, unspecified: Secondary | ICD-10-CM | POA: Diagnosis not present

## 2022-08-27 DIAGNOSIS — D631 Anemia in chronic kidney disease: Secondary | ICD-10-CM | POA: Diagnosis not present

## 2022-08-29 DIAGNOSIS — R252 Cramp and spasm: Secondary | ICD-10-CM | POA: Diagnosis not present

## 2022-08-29 DIAGNOSIS — D631 Anemia in chronic kidney disease: Secondary | ICD-10-CM | POA: Diagnosis not present

## 2022-08-29 DIAGNOSIS — N186 End stage renal disease: Secondary | ICD-10-CM | POA: Diagnosis not present

## 2022-08-29 DIAGNOSIS — N2581 Secondary hyperparathyroidism of renal origin: Secondary | ICD-10-CM | POA: Diagnosis not present

## 2022-08-29 DIAGNOSIS — D509 Iron deficiency anemia, unspecified: Secondary | ICD-10-CM | POA: Diagnosis not present

## 2022-08-29 DIAGNOSIS — I959 Hypotension, unspecified: Secondary | ICD-10-CM | POA: Diagnosis not present

## 2022-09-01 DIAGNOSIS — I82409 Acute embolism and thrombosis of unspecified deep veins of unspecified lower extremity: Secondary | ICD-10-CM | POA: Diagnosis not present

## 2022-09-01 DIAGNOSIS — N2581 Secondary hyperparathyroidism of renal origin: Secondary | ICD-10-CM | POA: Diagnosis not present

## 2022-09-01 DIAGNOSIS — D509 Iron deficiency anemia, unspecified: Secondary | ICD-10-CM | POA: Diagnosis not present

## 2022-09-01 DIAGNOSIS — D631 Anemia in chronic kidney disease: Secondary | ICD-10-CM | POA: Diagnosis not present

## 2022-09-01 DIAGNOSIS — R252 Cramp and spasm: Secondary | ICD-10-CM | POA: Diagnosis not present

## 2022-09-01 DIAGNOSIS — I959 Hypotension, unspecified: Secondary | ICD-10-CM | POA: Diagnosis not present

## 2022-09-01 DIAGNOSIS — N186 End stage renal disease: Secondary | ICD-10-CM | POA: Diagnosis not present

## 2022-09-02 DIAGNOSIS — H401131 Primary open-angle glaucoma, bilateral, mild stage: Secondary | ICD-10-CM | POA: Diagnosis not present

## 2022-09-03 DIAGNOSIS — R252 Cramp and spasm: Secondary | ICD-10-CM | POA: Diagnosis not present

## 2022-09-03 DIAGNOSIS — I959 Hypotension, unspecified: Secondary | ICD-10-CM | POA: Diagnosis not present

## 2022-09-03 DIAGNOSIS — N2581 Secondary hyperparathyroidism of renal origin: Secondary | ICD-10-CM | POA: Diagnosis not present

## 2022-09-03 DIAGNOSIS — N186 End stage renal disease: Secondary | ICD-10-CM | POA: Diagnosis not present

## 2022-09-03 DIAGNOSIS — D509 Iron deficiency anemia, unspecified: Secondary | ICD-10-CM | POA: Diagnosis not present

## 2022-09-03 DIAGNOSIS — D631 Anemia in chronic kidney disease: Secondary | ICD-10-CM | POA: Diagnosis not present

## 2022-09-05 DIAGNOSIS — N2581 Secondary hyperparathyroidism of renal origin: Secondary | ICD-10-CM | POA: Diagnosis not present

## 2022-09-05 DIAGNOSIS — R252 Cramp and spasm: Secondary | ICD-10-CM | POA: Diagnosis not present

## 2022-09-05 DIAGNOSIS — D631 Anemia in chronic kidney disease: Secondary | ICD-10-CM | POA: Diagnosis not present

## 2022-09-05 DIAGNOSIS — D509 Iron deficiency anemia, unspecified: Secondary | ICD-10-CM | POA: Diagnosis not present

## 2022-09-05 DIAGNOSIS — N186 End stage renal disease: Secondary | ICD-10-CM | POA: Diagnosis not present

## 2022-09-05 DIAGNOSIS — I959 Hypotension, unspecified: Secondary | ICD-10-CM | POA: Diagnosis not present

## 2022-09-06 DIAGNOSIS — R002 Palpitations: Secondary | ICD-10-CM | POA: Diagnosis not present

## 2022-09-06 DIAGNOSIS — N186 End stage renal disease: Secondary | ICD-10-CM | POA: Diagnosis not present

## 2022-09-06 DIAGNOSIS — I10 Essential (primary) hypertension: Secondary | ICD-10-CM | POA: Diagnosis not present

## 2022-09-06 DIAGNOSIS — I120 Hypertensive chronic kidney disease with stage 5 chronic kidney disease or end stage renal disease: Secondary | ICD-10-CM | POA: Diagnosis not present

## 2022-09-06 DIAGNOSIS — Z992 Dependence on renal dialysis: Secondary | ICD-10-CM | POA: Diagnosis not present

## 2022-09-08 DIAGNOSIS — N186 End stage renal disease: Secondary | ICD-10-CM | POA: Diagnosis not present

## 2022-09-08 DIAGNOSIS — D509 Iron deficiency anemia, unspecified: Secondary | ICD-10-CM | POA: Diagnosis not present

## 2022-09-08 DIAGNOSIS — N2581 Secondary hyperparathyroidism of renal origin: Secondary | ICD-10-CM | POA: Diagnosis not present

## 2022-09-08 DIAGNOSIS — Z23 Encounter for immunization: Secondary | ICD-10-CM | POA: Diagnosis not present

## 2022-09-08 DIAGNOSIS — I82409 Acute embolism and thrombosis of unspecified deep veins of unspecified lower extremity: Secondary | ICD-10-CM | POA: Diagnosis not present

## 2022-09-08 DIAGNOSIS — D631 Anemia in chronic kidney disease: Secondary | ICD-10-CM | POA: Diagnosis not present

## 2022-09-09 ENCOUNTER — Other Ambulatory Visit: Payer: Self-pay | Admitting: Family Medicine

## 2022-09-09 DIAGNOSIS — Z1231 Encounter for screening mammogram for malignant neoplasm of breast: Secondary | ICD-10-CM

## 2022-09-10 DIAGNOSIS — E119 Type 2 diabetes mellitus without complications: Secondary | ICD-10-CM | POA: Diagnosis not present

## 2022-09-10 DIAGNOSIS — N2581 Secondary hyperparathyroidism of renal origin: Secondary | ICD-10-CM | POA: Diagnosis not present

## 2022-09-10 DIAGNOSIS — Z23 Encounter for immunization: Secondary | ICD-10-CM | POA: Diagnosis not present

## 2022-09-10 DIAGNOSIS — D509 Iron deficiency anemia, unspecified: Secondary | ICD-10-CM | POA: Diagnosis not present

## 2022-09-10 DIAGNOSIS — D631 Anemia in chronic kidney disease: Secondary | ICD-10-CM | POA: Diagnosis not present

## 2022-09-10 DIAGNOSIS — N186 End stage renal disease: Secondary | ICD-10-CM | POA: Diagnosis not present

## 2022-09-12 DIAGNOSIS — Z23 Encounter for immunization: Secondary | ICD-10-CM | POA: Diagnosis not present

## 2022-09-12 DIAGNOSIS — N2581 Secondary hyperparathyroidism of renal origin: Secondary | ICD-10-CM | POA: Diagnosis not present

## 2022-09-12 DIAGNOSIS — N186 End stage renal disease: Secondary | ICD-10-CM | POA: Diagnosis not present

## 2022-09-12 DIAGNOSIS — D631 Anemia in chronic kidney disease: Secondary | ICD-10-CM | POA: Diagnosis not present

## 2022-09-12 DIAGNOSIS — D509 Iron deficiency anemia, unspecified: Secondary | ICD-10-CM | POA: Diagnosis not present

## 2022-09-15 DIAGNOSIS — Z23 Encounter for immunization: Secondary | ICD-10-CM | POA: Diagnosis not present

## 2022-09-15 DIAGNOSIS — D509 Iron deficiency anemia, unspecified: Secondary | ICD-10-CM | POA: Diagnosis not present

## 2022-09-15 DIAGNOSIS — N186 End stage renal disease: Secondary | ICD-10-CM | POA: Diagnosis not present

## 2022-09-15 DIAGNOSIS — I82409 Acute embolism and thrombosis of unspecified deep veins of unspecified lower extremity: Secondary | ICD-10-CM | POA: Diagnosis not present

## 2022-09-15 DIAGNOSIS — N2581 Secondary hyperparathyroidism of renal origin: Secondary | ICD-10-CM | POA: Diagnosis not present

## 2022-09-15 DIAGNOSIS — D631 Anemia in chronic kidney disease: Secondary | ICD-10-CM | POA: Diagnosis not present

## 2022-09-17 DIAGNOSIS — D509 Iron deficiency anemia, unspecified: Secondary | ICD-10-CM | POA: Diagnosis not present

## 2022-09-17 DIAGNOSIS — N2581 Secondary hyperparathyroidism of renal origin: Secondary | ICD-10-CM | POA: Diagnosis not present

## 2022-09-17 DIAGNOSIS — Z23 Encounter for immunization: Secondary | ICD-10-CM | POA: Diagnosis not present

## 2022-09-17 DIAGNOSIS — N186 End stage renal disease: Secondary | ICD-10-CM | POA: Diagnosis not present

## 2022-09-17 DIAGNOSIS — D631 Anemia in chronic kidney disease: Secondary | ICD-10-CM | POA: Diagnosis not present

## 2022-09-19 DIAGNOSIS — D631 Anemia in chronic kidney disease: Secondary | ICD-10-CM | POA: Diagnosis not present

## 2022-09-19 DIAGNOSIS — D509 Iron deficiency anemia, unspecified: Secondary | ICD-10-CM | POA: Diagnosis not present

## 2022-09-19 DIAGNOSIS — Z23 Encounter for immunization: Secondary | ICD-10-CM | POA: Diagnosis not present

## 2022-09-19 DIAGNOSIS — N2581 Secondary hyperparathyroidism of renal origin: Secondary | ICD-10-CM | POA: Diagnosis not present

## 2022-09-19 DIAGNOSIS — N186 End stage renal disease: Secondary | ICD-10-CM | POA: Diagnosis not present

## 2022-09-22 DIAGNOSIS — Z23 Encounter for immunization: Secondary | ICD-10-CM | POA: Diagnosis not present

## 2022-09-22 DIAGNOSIS — N2581 Secondary hyperparathyroidism of renal origin: Secondary | ICD-10-CM | POA: Diagnosis not present

## 2022-09-22 DIAGNOSIS — D631 Anemia in chronic kidney disease: Secondary | ICD-10-CM | POA: Diagnosis not present

## 2022-09-22 DIAGNOSIS — D509 Iron deficiency anemia, unspecified: Secondary | ICD-10-CM | POA: Diagnosis not present

## 2022-09-22 DIAGNOSIS — N186 End stage renal disease: Secondary | ICD-10-CM | POA: Diagnosis not present

## 2022-09-22 DIAGNOSIS — I82409 Acute embolism and thrombosis of unspecified deep veins of unspecified lower extremity: Secondary | ICD-10-CM | POA: Diagnosis not present

## 2022-09-24 DIAGNOSIS — N186 End stage renal disease: Secondary | ICD-10-CM | POA: Diagnosis not present

## 2022-09-24 DIAGNOSIS — N2581 Secondary hyperparathyroidism of renal origin: Secondary | ICD-10-CM | POA: Diagnosis not present

## 2022-09-24 DIAGNOSIS — D509 Iron deficiency anemia, unspecified: Secondary | ICD-10-CM | POA: Diagnosis not present

## 2022-09-24 DIAGNOSIS — D631 Anemia in chronic kidney disease: Secondary | ICD-10-CM | POA: Diagnosis not present

## 2022-09-24 DIAGNOSIS — Z23 Encounter for immunization: Secondary | ICD-10-CM | POA: Diagnosis not present

## 2022-09-26 DIAGNOSIS — D631 Anemia in chronic kidney disease: Secondary | ICD-10-CM | POA: Diagnosis not present

## 2022-09-26 DIAGNOSIS — N186 End stage renal disease: Secondary | ICD-10-CM | POA: Diagnosis not present

## 2022-09-26 DIAGNOSIS — N2581 Secondary hyperparathyroidism of renal origin: Secondary | ICD-10-CM | POA: Diagnosis not present

## 2022-09-26 DIAGNOSIS — Z23 Encounter for immunization: Secondary | ICD-10-CM | POA: Diagnosis not present

## 2022-09-26 DIAGNOSIS — D509 Iron deficiency anemia, unspecified: Secondary | ICD-10-CM | POA: Diagnosis not present

## 2022-09-29 DIAGNOSIS — Z23 Encounter for immunization: Secondary | ICD-10-CM | POA: Diagnosis not present

## 2022-09-29 DIAGNOSIS — D509 Iron deficiency anemia, unspecified: Secondary | ICD-10-CM | POA: Diagnosis not present

## 2022-09-29 DIAGNOSIS — N186 End stage renal disease: Secondary | ICD-10-CM | POA: Diagnosis not present

## 2022-09-29 DIAGNOSIS — I82409 Acute embolism and thrombosis of unspecified deep veins of unspecified lower extremity: Secondary | ICD-10-CM | POA: Diagnosis not present

## 2022-09-29 DIAGNOSIS — D631 Anemia in chronic kidney disease: Secondary | ICD-10-CM | POA: Diagnosis not present

## 2022-09-29 DIAGNOSIS — N2581 Secondary hyperparathyroidism of renal origin: Secondary | ICD-10-CM | POA: Diagnosis not present

## 2022-10-01 DIAGNOSIS — N186 End stage renal disease: Secondary | ICD-10-CM | POA: Diagnosis not present

## 2022-10-01 DIAGNOSIS — N2581 Secondary hyperparathyroidism of renal origin: Secondary | ICD-10-CM | POA: Diagnosis not present

## 2022-10-01 DIAGNOSIS — D631 Anemia in chronic kidney disease: Secondary | ICD-10-CM | POA: Diagnosis not present

## 2022-10-01 DIAGNOSIS — D509 Iron deficiency anemia, unspecified: Secondary | ICD-10-CM | POA: Diagnosis not present

## 2022-10-01 DIAGNOSIS — Z23 Encounter for immunization: Secondary | ICD-10-CM | POA: Diagnosis not present

## 2022-10-02 DIAGNOSIS — T82898D Other specified complication of vascular prosthetic devices, implants and grafts, subsequent encounter: Secondary | ICD-10-CM | POA: Diagnosis not present

## 2022-10-02 DIAGNOSIS — Z95828 Presence of other vascular implants and grafts: Secondary | ICD-10-CM | POA: Diagnosis not present

## 2022-10-02 DIAGNOSIS — N186 End stage renal disease: Secondary | ICD-10-CM | POA: Diagnosis not present

## 2022-10-03 DIAGNOSIS — N186 End stage renal disease: Secondary | ICD-10-CM | POA: Diagnosis not present

## 2022-10-03 DIAGNOSIS — D631 Anemia in chronic kidney disease: Secondary | ICD-10-CM | POA: Diagnosis not present

## 2022-10-03 DIAGNOSIS — D509 Iron deficiency anemia, unspecified: Secondary | ICD-10-CM | POA: Diagnosis not present

## 2022-10-03 DIAGNOSIS — N2581 Secondary hyperparathyroidism of renal origin: Secondary | ICD-10-CM | POA: Diagnosis not present

## 2022-10-03 DIAGNOSIS — Z23 Encounter for immunization: Secondary | ICD-10-CM | POA: Diagnosis not present

## 2022-10-06 DIAGNOSIS — I82409 Acute embolism and thrombosis of unspecified deep veins of unspecified lower extremity: Secondary | ICD-10-CM | POA: Diagnosis not present

## 2022-10-06 DIAGNOSIS — Z23 Encounter for immunization: Secondary | ICD-10-CM | POA: Diagnosis not present

## 2022-10-06 DIAGNOSIS — N2581 Secondary hyperparathyroidism of renal origin: Secondary | ICD-10-CM | POA: Diagnosis not present

## 2022-10-06 DIAGNOSIS — D631 Anemia in chronic kidney disease: Secondary | ICD-10-CM | POA: Diagnosis not present

## 2022-10-06 DIAGNOSIS — N186 End stage renal disease: Secondary | ICD-10-CM | POA: Diagnosis not present

## 2022-10-06 DIAGNOSIS — D509 Iron deficiency anemia, unspecified: Secondary | ICD-10-CM | POA: Diagnosis not present

## 2022-10-06 DIAGNOSIS — Z992 Dependence on renal dialysis: Secondary | ICD-10-CM | POA: Diagnosis not present

## 2022-10-07 DIAGNOSIS — R002 Palpitations: Secondary | ICD-10-CM | POA: Diagnosis not present

## 2022-10-07 DIAGNOSIS — N186 End stage renal disease: Secondary | ICD-10-CM | POA: Diagnosis not present

## 2022-10-07 DIAGNOSIS — E782 Mixed hyperlipidemia: Secondary | ICD-10-CM | POA: Diagnosis not present

## 2022-10-07 DIAGNOSIS — Z1330 Encounter for screening examination for mental health and behavioral disorders, unspecified: Secondary | ICD-10-CM | POA: Diagnosis not present

## 2022-10-07 DIAGNOSIS — I10 Essential (primary) hypertension: Secondary | ICD-10-CM | POA: Diagnosis not present

## 2022-10-08 DIAGNOSIS — E119 Type 2 diabetes mellitus without complications: Secondary | ICD-10-CM | POA: Diagnosis not present

## 2022-10-08 DIAGNOSIS — N186 End stage renal disease: Secondary | ICD-10-CM | POA: Diagnosis not present

## 2022-10-08 DIAGNOSIS — D631 Anemia in chronic kidney disease: Secondary | ICD-10-CM | POA: Diagnosis not present

## 2022-10-08 DIAGNOSIS — D509 Iron deficiency anemia, unspecified: Secondary | ICD-10-CM | POA: Diagnosis not present

## 2022-10-08 DIAGNOSIS — N2581 Secondary hyperparathyroidism of renal origin: Secondary | ICD-10-CM | POA: Diagnosis not present

## 2022-10-10 DIAGNOSIS — N2581 Secondary hyperparathyroidism of renal origin: Secondary | ICD-10-CM | POA: Diagnosis not present

## 2022-10-10 DIAGNOSIS — N186 End stage renal disease: Secondary | ICD-10-CM | POA: Diagnosis not present

## 2022-10-10 DIAGNOSIS — D509 Iron deficiency anemia, unspecified: Secondary | ICD-10-CM | POA: Diagnosis not present

## 2022-10-10 DIAGNOSIS — D631 Anemia in chronic kidney disease: Secondary | ICD-10-CM | POA: Diagnosis not present

## 2022-10-13 DIAGNOSIS — N2581 Secondary hyperparathyroidism of renal origin: Secondary | ICD-10-CM | POA: Diagnosis not present

## 2022-10-13 DIAGNOSIS — D631 Anemia in chronic kidney disease: Secondary | ICD-10-CM | POA: Diagnosis not present

## 2022-10-13 DIAGNOSIS — D509 Iron deficiency anemia, unspecified: Secondary | ICD-10-CM | POA: Diagnosis not present

## 2022-10-13 DIAGNOSIS — N186 End stage renal disease: Secondary | ICD-10-CM | POA: Diagnosis not present

## 2022-10-13 DIAGNOSIS — I82409 Acute embolism and thrombosis of unspecified deep veins of unspecified lower extremity: Secondary | ICD-10-CM | POA: Diagnosis not present

## 2022-10-15 DIAGNOSIS — D631 Anemia in chronic kidney disease: Secondary | ICD-10-CM | POA: Diagnosis not present

## 2022-10-15 DIAGNOSIS — D509 Iron deficiency anemia, unspecified: Secondary | ICD-10-CM | POA: Diagnosis not present

## 2022-10-15 DIAGNOSIS — N2581 Secondary hyperparathyroidism of renal origin: Secondary | ICD-10-CM | POA: Diagnosis not present

## 2022-10-15 DIAGNOSIS — N186 End stage renal disease: Secondary | ICD-10-CM | POA: Diagnosis not present

## 2022-10-17 DIAGNOSIS — N2581 Secondary hyperparathyroidism of renal origin: Secondary | ICD-10-CM | POA: Diagnosis not present

## 2022-10-17 DIAGNOSIS — N186 End stage renal disease: Secondary | ICD-10-CM | POA: Diagnosis not present

## 2022-10-17 DIAGNOSIS — D631 Anemia in chronic kidney disease: Secondary | ICD-10-CM | POA: Diagnosis not present

## 2022-10-17 DIAGNOSIS — D509 Iron deficiency anemia, unspecified: Secondary | ICD-10-CM | POA: Diagnosis not present

## 2022-10-20 DIAGNOSIS — N186 End stage renal disease: Secondary | ICD-10-CM | POA: Diagnosis not present

## 2022-10-20 DIAGNOSIS — N2581 Secondary hyperparathyroidism of renal origin: Secondary | ICD-10-CM | POA: Diagnosis not present

## 2022-10-20 DIAGNOSIS — D509 Iron deficiency anemia, unspecified: Secondary | ICD-10-CM | POA: Diagnosis not present

## 2022-10-20 DIAGNOSIS — I82409 Acute embolism and thrombosis of unspecified deep veins of unspecified lower extremity: Secondary | ICD-10-CM | POA: Diagnosis not present

## 2022-10-20 DIAGNOSIS — D631 Anemia in chronic kidney disease: Secondary | ICD-10-CM | POA: Diagnosis not present

## 2022-10-22 DIAGNOSIS — D631 Anemia in chronic kidney disease: Secondary | ICD-10-CM | POA: Diagnosis not present

## 2022-10-22 DIAGNOSIS — N186 End stage renal disease: Secondary | ICD-10-CM | POA: Diagnosis not present

## 2022-10-22 DIAGNOSIS — N2581 Secondary hyperparathyroidism of renal origin: Secondary | ICD-10-CM | POA: Diagnosis not present

## 2022-10-22 DIAGNOSIS — D509 Iron deficiency anemia, unspecified: Secondary | ICD-10-CM | POA: Diagnosis not present

## 2022-10-23 ENCOUNTER — Ambulatory Visit: Payer: Medicare HMO

## 2022-10-23 DIAGNOSIS — Z1231 Encounter for screening mammogram for malignant neoplasm of breast: Secondary | ICD-10-CM

## 2022-10-24 DIAGNOSIS — N186 End stage renal disease: Secondary | ICD-10-CM | POA: Diagnosis not present

## 2022-10-24 DIAGNOSIS — D509 Iron deficiency anemia, unspecified: Secondary | ICD-10-CM | POA: Diagnosis not present

## 2022-10-24 DIAGNOSIS — N2581 Secondary hyperparathyroidism of renal origin: Secondary | ICD-10-CM | POA: Diagnosis not present

## 2022-10-24 DIAGNOSIS — D631 Anemia in chronic kidney disease: Secondary | ICD-10-CM | POA: Diagnosis not present

## 2022-10-27 DIAGNOSIS — D509 Iron deficiency anemia, unspecified: Secondary | ICD-10-CM | POA: Diagnosis not present

## 2022-10-27 DIAGNOSIS — I82409 Acute embolism and thrombosis of unspecified deep veins of unspecified lower extremity: Secondary | ICD-10-CM | POA: Diagnosis not present

## 2022-10-27 DIAGNOSIS — D631 Anemia in chronic kidney disease: Secondary | ICD-10-CM | POA: Diagnosis not present

## 2022-10-27 DIAGNOSIS — N2581 Secondary hyperparathyroidism of renal origin: Secondary | ICD-10-CM | POA: Diagnosis not present

## 2022-10-27 DIAGNOSIS — N186 End stage renal disease: Secondary | ICD-10-CM | POA: Diagnosis not present

## 2022-10-27 NOTE — Progress Notes (Signed)
Please call patient. Normal mammogram.  Repeat in 1 year.  

## 2022-10-29 DIAGNOSIS — N186 End stage renal disease: Secondary | ICD-10-CM | POA: Diagnosis not present

## 2022-10-29 DIAGNOSIS — D631 Anemia in chronic kidney disease: Secondary | ICD-10-CM | POA: Diagnosis not present

## 2022-10-29 DIAGNOSIS — N2581 Secondary hyperparathyroidism of renal origin: Secondary | ICD-10-CM | POA: Diagnosis not present

## 2022-10-29 DIAGNOSIS — D509 Iron deficiency anemia, unspecified: Secondary | ICD-10-CM | POA: Diagnosis not present

## 2022-10-30 DIAGNOSIS — R002 Palpitations: Secondary | ICD-10-CM | POA: Diagnosis not present

## 2022-10-31 DIAGNOSIS — D509 Iron deficiency anemia, unspecified: Secondary | ICD-10-CM | POA: Diagnosis not present

## 2022-10-31 DIAGNOSIS — N186 End stage renal disease: Secondary | ICD-10-CM | POA: Diagnosis not present

## 2022-10-31 DIAGNOSIS — N2581 Secondary hyperparathyroidism of renal origin: Secondary | ICD-10-CM | POA: Diagnosis not present

## 2022-10-31 DIAGNOSIS — D631 Anemia in chronic kidney disease: Secondary | ICD-10-CM | POA: Diagnosis not present

## 2022-11-03 ENCOUNTER — Ambulatory Visit: Payer: Medicare HMO | Admitting: Family Medicine

## 2022-11-03 DIAGNOSIS — D631 Anemia in chronic kidney disease: Secondary | ICD-10-CM | POA: Diagnosis not present

## 2022-11-03 DIAGNOSIS — N186 End stage renal disease: Secondary | ICD-10-CM | POA: Diagnosis not present

## 2022-11-03 DIAGNOSIS — I82409 Acute embolism and thrombosis of unspecified deep veins of unspecified lower extremity: Secondary | ICD-10-CM | POA: Diagnosis not present

## 2022-11-03 DIAGNOSIS — D509 Iron deficiency anemia, unspecified: Secondary | ICD-10-CM | POA: Diagnosis not present

## 2022-11-03 DIAGNOSIS — Z0000 Encounter for general adult medical examination without abnormal findings: Secondary | ICD-10-CM

## 2022-11-03 DIAGNOSIS — N2581 Secondary hyperparathyroidism of renal origin: Secondary | ICD-10-CM | POA: Diagnosis not present

## 2022-11-03 NOTE — Progress Notes (Signed)
MEDICARE ANNUAL WELLNESS VISIT  11/03/2022  Telephone Visit Disclaimer This Medicare AWV was conducted by telephone due to national recommendations for restrictions regarding the COVID-19 Pandemic (e.g. social distancing).  I verified, using two identifiers, that I am speaking with Joy Anderson or their authorized healthcare agent. I discussed the limitations, risks, security, and privacy concerns of performing an evaluation and management service by telephone and the potential availability of an in-person appointment in the future. The patient expressed understanding and agreed to proceed.  Location of Patient: Home Location of Provider (nurse):  In the office.  Subjective:    Joy Anderson is a 72 y.o. female patient of Metheney, Barbarann Ehlers, MD who had a Medicare Annual Wellness Visit today via telephone. Joy Anderson is Retired and lives with their daughter. she has 3 children. she reports that she is socially active and does interact with friends/family regularly. she is moderately physically active and enjoys sewing.  Patient Care Team: Agapito Games, MD as PCP - General (Family Medicine) Lenna Sciara, MD as Referring Physician (Rheumatology) Robert Bellow. Roseanne Reno, MD (Nephrology) Dr. Areta Haber (Ophthalmology)     11/03/2022    3:19 PM 11/01/2021    3:10 PM 10/19/2020    4:05 PM 07/21/2019    4:37 PM 11/15/2018    9:46 AM 08/20/2017    1:24 PM 08/19/2016    1:35 PM  Advanced Directives  Does Patient Have a Medical Advance Directive? No No No Yes No No No  Type of Print production planner of Healthcare Power of Attorney in Chart?    No - copy requested     Would patient like information on creating a medical advance directive? No - Patient declined No - Patient declined No - Patient declined  No - Patient declined Yes (MAU/Ambulatory/Procedural Areas - Information given) Yes (MAU/Ambulatory/Procedural Areas - Information given)     Hospital Utilization Over the Past 12 Months: # of hospitalizations or ER visits: 1 # of surgeries: 0  Review of Systems    Patient reports that her overall health is unchanged compared to last year.  History obtained from chart review and the patient  Patient Reported Readings (BP, Pulse, CBG, Weight, etc) none Per patient no change in vitals since last visit, unable to obtain new vitals due to telehealth visit  Pain Assessment Pain : No/denies pain     Current Medications & Allergies (verified) Allergies as of 11/03/2022       Reactions   Ace Inhibitors    REACTION: cough Other reaction(s): Other (See Comments) REACTION: cough   Metformin    Other reaction(s): Other (See Comments) Nausea, decreased appetite Nausea, decreased appetite   Fish Oil Other (See Comments)   Other reaction(s): Other (See Comments)   Metformin And Related Other (See Comments)   Nausea, decreased appetite        Medication List        Accurate as of November 03, 2022  3:32 PM. If you have any questions, ask your nurse or doctor.          acetaminophen 500 MG tablet Commonly known as: TYLENOL Take 500 mg by mouth every 6 (six) hours as needed. Two tablets   AMBULATORY NON FORMULARY MEDICATION Medication Name: Shower chair   atorvastatin 20 MG tablet Commonly known as: LIPITOR Take 1 tablet (20 mg total) by mouth at bedtime. Due for follow up visit   CALCIUM  PO Take by mouth.   cinacalcet 30 MG tablet Commonly known as: SENSIPAR Take 2 tablets by mouth daily.   dorzolamide-timolol 2-0.5 % ophthalmic solution Commonly known as: COSOPT 1 drop 2 (two) times daily.   hydrocortisone 2.5 % cream Apply 1 Application topically 2 (two) times daily as needed.   hydroxychloroquine 200 MG tablet Commonly known as: PLAQUENIL Take 200 mg by mouth daily. Takes it once a day.   latanoprost 0.005 % ophthalmic solution Commonly known as: XALATAN Apply to eye.   multivitamin  Tabs tablet   warfarin 1 MG tablet Commonly known as: COUMADIN   warfarin 4 MG tablet Commonly known as: COUMADIN Take by mouth. 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   DVT (deep venous thrombosis) (HCC)    anti phospholipid antibody + -- Dr Julio Alm   Hypertension    OAB (overactive bladder)    off meds   Post-menopausal    Proteinuria    Dr Lu Duffel   Raynaud's disease    Past Surgical History:  Procedure Laterality Date   AV FISTULA PLACEMENT  01/06/2014   CATARACT EXTRACTION, BILATERAL  01/07/2015   HIP SURGERY Right 10/2018   left one done 10/2019   TOTAL ABDOMINAL HYSTERECTOMY  01/07/1999   for fibroids w/ 1 oophorectomy   Family History  Problem Relation Age of Onset   Alcohol abuse Father    Colon cancer Brother    Prostate cancer Brother    Cerebral palsy Brother    Diabetes Other    Social History   Socioeconomic History   Marital status: Divorced    Spouse name: Not on file   Number of children: 3   Years of education: 12   Highest education level: 12th grade  Occupational History   Occupation: Retired  Tobacco Use   Smoking status: Never   Smokeless tobacco: Never  Vaping Use   Vaping status: Never Used  Substance and Sexual Activity   Alcohol use: Yes    Comment: occasional   Drug use: No   Sexual activity: Not on file  Other Topics Concern   Not on file  Social History Narrative   Lives with her daughter. She enjoys sewing.    Social Determinants of Health   Financial Resource Strain: Low Risk  (11/03/2022)   Overall Financial Resource Strain (CARDIA)    Difficulty of Paying Living Expenses: Not hard at all  Food Insecurity: No Food Insecurity (11/03/2022)   Hunger Vital Sign    Worried About Running Out of  Food in the Last Year: Never true    Ran Out of Food in the Last Year: Never true  Transportation Needs: Unmet Transportation Needs (11/03/2022)   PRAPARE - Transportation    Lack of Transportation (Medical): No    Lack of Transportation (Non-Medical): Yes  Physical Activity: Sufficiently Active (11/03/2022)   Exercise Vital Sign    Days of Exercise per Week: 3 days    Minutes of Exercise per Session: 60 min  Stress: No Stress Concern Present (11/03/2022)   [employer] of Occupational Health - Occupational Stress Questionnaire    Feeling of Stress : Not at all  Social Connections: Socially Isolated (11/03/2022)   Social Connection and Isolation Panel [NHANES]    Frequency of Communication with  Friends and Family: More than three times a week    Frequency of Social Gatherings with Friends and Family: More than three times a week    Attends Religious Services: Never    Database administrator or Organizations: No    Attends Banker Meetings: Never    Marital Status: Divorced    Activities of Daily Living    11/03/2022    3:23 PM  In your present state of health, do you have any difficulty performing the following activities:  Hearing? 0  Vision? 0  Difficulty concentrating or making decisions? 0  Walking or climbing stairs? 1  Comment stair are difficult  Dressing or bathing? 0  Doing errands, shopping? 0  Preparing Food and eating ? N  Using the Toilet? N  In the past six months, have you accidently leaked urine? N  Do you have problems with loss of bowel control? N  Managing your Medications? N  Managing your Finances? N  Housekeeping or managing your Housekeeping? N    Patient Education/ Literacy How often do you need to have someone help you when you read instructions, pamphlets, or other written materials from your doctor or pharmacy?: 1 - Never What is the last grade level you completed in school?: 12th grade  Exercise    Diet Patient reports  consuming 2 meals a day and 2 snack(s) a day Patient reports that her primary diet is: Regular Patient reports that she does have regular access to food.   Depression Screen    11/03/2022    3:19 PM 06/10/2022    2:38 PM 11/01/2021    3:11 PM 02/12/2021   10:41 AM 10/19/2020    4:06 PM 09/28/2020    2:16 PM 01/19/2020    4:08 PM  PHQ 2/9 Scores  PHQ - 2 Score 0 0 0 0 0 0 0     Fall Risk    11/03/2022    3:19 PM 06/10/2022    2:38 PM 11/01/2021    3:11 PM 02/12/2021   10:40 AM 10/19/2020    4:05 PM  Fall Risk   Falls in the past year? 0 0 0 1 1  Number falls in past yr: 0 0 0 0 0  Injury with Fall? 0 0 0 1 1  Risk for fall due to : Impaired mobility No Fall Risks No Fall Risks Other (Comment) History of fall(s)  Risk for fall due to: Comment    Fell in bath tub and hurt left knee   Follow up Falls evaluation completed Falls evaluation completed Falls evaluation completed Falls prevention discussed;Falls evaluation completed Falls evaluation completed;Education provided;Falls prevention discussed     Objective:  Joy Anderson seemed alert and oriented and she participated appropriately during our telephone visit.  Blood Pressure Weight BMI  BP Readings from Last 3 Encounters:  06/10/22 138/61  02/12/21 126/65  12/22/20 (!) 168/79   Wt Readings from Last 3 Encounters:  06/10/22 147 lb (66.7 kg)  02/12/21 137 lb (62.1 kg)  12/22/20 140 lb (63.5 kg)   BMI Readings from Last 1 Encounters:  06/10/22 27.33 kg/m    *Unable to obtain current vital signs, weight, and BMI due to telephone visit type  Hearing/Vision  Joy Anderson did not seem to have difficulty with hearing/understanding during the telephone conversation Reports that she has had a formal eye exam by an eye care professional within the past year Reports that she has not had a formal hearing evaluation within  the past year *Unable to fully assess hearing and vision during telephone visit type  Cognitive Function:     11/03/2022    3:28 PM 11/01/2021    3:16 PM 10/19/2020    4:15 PM 07/21/2019    3:51 PM 08/20/2017    1:27 PM  6CIT Screen  What Year? 0 points 0 points 0 points 0 points 0 points  What month? 0 points 0 points 0 points 0 points 0 points  What time? 0 points 0 points 0 points 0 points 0 points  Count back from 20 0 points 0 points 0 points 0 points 0 points  Months in reverse 0 points 2 points 2 points 0 points 0 points  Repeat phrase 0 points 0 points 2 points 0 points 2 points  Total Score 0 points 2 points 4 points 0 points 2 points   (Normal:0-7, Significant for Dysfunction: >8)  Normal Cognitive Function Screening: Yes   Immunization & Health Maintenance Record Immunization History  Administered Date(s) Administered   Fluad Quad(high Dose 65+) 10/28/2019   Hepatitis B 03/04/2019   Hepatitis B, ADULT 10/25/2014, 11/24/2014, 05/14/2015   Influenza Whole 12/12/2009   Influenza, High Dose Seasonal PF 10/06/2016, 10/25/2018, 10/25/2018, 10/12/2020, 09/07/2022   Influenza,inj,Quad PF,6+ Mos 10/14/2012, 11/01/2013   Influenza-Unspecified 10/25/2014, 10/01/2021   Moderna Covid-19 Vaccine Bivalent Booster 73yrs & up 10/21/2021   Moderna Sars-Covid-2 Vaccination 02/18/2019, 03/18/2019   PPD Test 01/01/2020   Pneumococcal Conjugate-13 04/25/2014   Pneumococcal Polysaccharide-23 12/12/2009, 12/20/2014   Tdap 09/06/2004, 09/07/2014    Health Maintenance  Topic Date Due   COVID-19 Vaccine (4 - 2023-24 season) 11/19/2022 (Originally 09/07/2022)   Zoster Vaccines- Shingrix (1 of 2) 02/03/2023 (Originally 03/30/1969)   Colonoscopy  11/03/2023 (Originally 06/29/2022)   Medicare Annual Wellness (AWV)  11/03/2023   DEXA SCAN  11/15/2023   DTaP/Tdap/Td (3 - Td or Tdap) 09/06/2024   MAMMOGRAM  10/22/2024   Pneumonia Vaccine 57+ Years old  Completed   INFLUENZA VACCINE  Completed   Hepatitis C Screening  Completed   HPV VACCINES  Aged Out       Assessment  This is a routine wellness  examination for Joy Anderson.  Health Maintenance: Due or Overdue There are no preventive care reminders to display for this patient.   Joy Anderson does need a referral for Community Assistance: Care Management:   yes Social Work:    no Prescription Assistance:  no Nutrition/Diabetes Education:  no   Plan:  Personalized Goals  Goals Addressed               This Visit's Progress     Patient Stated (pt-stated)        11/03/2022 AWV Goal: Improved Nutrition/Diet  Patient will verbalize understanding that diet plays an important role in overall health and that a poor diet is a risk factor for many chronic medical conditions.  Over the next year, patient will improve self management of their diet by incorporating better variety. Patient will utilize available community resources to help with food acquisition if needed (ex: food pantries, [address], etc) Patient will work with nutrition specialist if a referral was made        Personalized Health Maintenance & Screening Recommendations  Shingles vaccine Colonoscopy- scheduled for 01/06/23  Lung Cancer Screening Recommended: no (Low Dose CT Chest recommended if Age 81-80 years, 20 pack-year currently smoking OR have quit w/in past 15 years) Hepatitis C Screening recommended: no HIV Screening recommended: no  Advanced Directives: Written information was not prepared per patient's request.  Referrals & Orders Orders Placed This Encounter  Procedures   AMB Referral [facility] Care Management    Follow-up Plan Follow-up with Agapito Games, MD as planned Schedule shingles vaccine at the pharmacy. Medicare wellness visit in one year.  AVS printed and mailed to the patient.    I have personally reviewed and noted the following in the patient's chart:   Medical and social history Use of alcohol, tobacco or illicit drugs  Current medications and supplements Functional ability and status Nutritional  status Physical activity Advanced directives List of other physicians Hospitalizations, surgeries, and ER visits in previous 12 months Vitals Screenings to include cognitive, depression, and falls Referrals and appointments  In addition, I have reviewed and discussed with Joy Anderson certain preventive protocols, quality metrics, and best practice recommendations. A written personalized care plan for preventive services as well as general preventive health recommendations is available and can be mailed to the patient at her request.      Modesto Charon, RN BSN  11/03/2022

## 2022-11-03 NOTE — Patient Instructions (Addendum)
MEDICARE ANNUAL WELLNESS VISIT Health Maintenance Summary and Written Plan of Care  Ms. Laural Benes ,  Thank you for allowing me to perform your Medicare Annual Wellness Visit and for your ongoing commitment to your health.   Health Maintenance & Immunization History Health Maintenance  Topic Date Due  . COVID-19 Vaccine (4 - 2023-24 season) 11/19/2022 (Originally 09/07/2022)  . Zoster Vaccines- Shingrix (1 of 2) 02/03/2023 (Originally 03/30/1969)  . Colonoscopy  11/03/2023 (Originally 06/29/2022)  . Medicare Annual Wellness (AWV)  11/03/2023  . DEXA SCAN  11/15/2023  . DTaP/Tdap/Td (3 - Td or Tdap) 09/06/2024  . MAMMOGRAM  10/22/2024  . Pneumonia Vaccine 73+ Years old  Completed  . INFLUENZA VACCINE  Completed  . Hepatitis C Screening  Completed  . HPV VACCINES  Aged Out   Immunization History  Administered Date(s) Administered  . Fluad Quad(high Dose 65+) 10/28/2019  . Hepatitis B 03/04/2019  . Hepatitis B, ADULT 10/25/2014, 11/24/2014, 05/14/2015  . Influenza Whole 12/12/2009  . Influenza, High Dose Seasonal PF 10/06/2016, 10/25/2018, 10/25/2018, 10/12/2020, 09/07/2022  . Influenza,inj,Quad PF,6+ Mos 10/14/2012, 11/01/2013  . Influenza-Unspecified 10/25/2014, 10/01/2021  . Moderna Covid-19 Vaccine Bivalent Booster 73yrs & up 10/21/2021  . Moderna Sars-Covid-2 Vaccination 02/18/2019, 03/18/2019  . PPD Test 01/01/2020  . Pneumococcal Conjugate-13 04/25/2014  . Pneumococcal Polysaccharide-23 12/12/2009, 12/20/2014  . Tdap 09/06/2004, 09/07/2014    These are the patient goals that we discussed:  Goals Addressed              This Visit's Progress   .  Patient Stated (pt-stated)        11/03/2022 AWV Goal: Improved Nutrition/Diet  Patient will verbalize understanding that diet plays an important role in overall health and that a poor diet is a risk factor for many chronic medical conditions.  Over the next year, patient will improve self management of their diet by  incorporating better variety. Patient will utilize available community resources to help with food acquisition if needed (ex: food pantries, [address], etc) Patient will work with nutrition specialist if a referral was made         This is a list of Health Maintenance Items that are overdue or due now: Shingles vaccine Colonoscopy- scheduled for 01/06/23  Orders/Referrals Placed Today: Orders Placed This Encounter  Procedures  . AMB Referral VBCI Care Management    Referral Priority:   Routine    Referral Type:   Consultation    Referral Reason:   Care Coordination    Number of Visits Requested:   1   (Contact our referral department at [phone] if you have not spoken with someone about your referral appointment within the next 5 days)    Follow-up Plan Follow-up with Agapito Games, MD as planned Schedule shingles vaccine at the pharmacy. Medicare wellness visit in one year.  AVS printed and mailed to the patient.       Health Maintenance, Female Adopting a healthy lifestyle and getting preventive care are important in promoting health and wellness. Ask your health care provider about: The right schedule for you to have regular tests and exams. Things you can do on your own to prevent diseases and keep yourself healthy. What should I know about diet, weight, and exercise? Eat a healthy diet  Eat a diet that includes plenty of vegetables, fruits, low-fat dairy products, and lean protein. Do not eat a lot of foods that are high in solid fats, added sugars, or sodium. Maintain a healthy weight Body  mass index (BMI) is used to identify weight problems. It estimates body fat based on height and weight. Your health care provider can help determine your BMI and help you achieve or maintain a healthy weight. Get regular exercise Get regular exercise. This is one of the most important things you can do for your health. Most adults should: Exercise for at least 150  minutes each week. The exercise should increase your heart rate and make you sweat (moderate-intensity exercise). Do strengthening exercises at least twice a week. This is in addition to the moderate-intensity exercise. Spend less time sitting. Even light physical activity can be beneficial. Watch cholesterol and blood lipids Have your blood tested for lipids and cholesterol at 73 years of age, then have this test every 5 years. Have your cholesterol levels checked more often if: Your lipid or cholesterol levels are high. You are older than 73 years of age. You are at high risk for heart disease. What should I know about cancer screening? Depending on your health history and family history, you may need to have cancer screening at various ages. This may include screening for: Breast cancer. Cervical cancer. Colorectal cancer. Skin cancer. Lung cancer. What should I know about heart disease, diabetes, and high blood pressure? Blood pressure and heart disease High blood pressure causes heart disease and increases the risk of stroke. This is more likely to develop in people who have high blood pressure readings or are overweight. Have your blood pressure checked: Every 3-5 years if you are 73-73 years of age. Every year if you are 73 years old or older. Diabetes Have regular diabetes screenings. This checks your fasting blood sugar level. Have the screening done: Once every three years after age 73 if you are at a normal weight and have a low risk for diabetes. More often and at a younger age if you are overweight or have a high risk for diabetes. What should I know about preventing infection? Hepatitis B If you have a higher risk for hepatitis B, you should be screened for this virus. Talk with your health care provider to find out if you are at risk for hepatitis B infection. Hepatitis C Testing is recommended for: Everyone born from [birth year] through 1966. Anyone with known risk factors  for hepatitis C. Sexually transmitted infections (STIs) Get screened for STIs, including gonorrhea and chlamydia, if: You are sexually active and are younger than 73 years of age. You are older than 73 years of age and your health care provider tells you that you are at risk for this type of infection. Your sexual activity has changed since you were last screened, and you are at increased risk for chlamydia or gonorrhea. Ask your health care provider if you are at risk. Ask your health care provider about whether you are at high risk for HIV. Your health care provider may recommend a prescription medicine to help prevent HIV infection. If you choose to take medicine to prevent HIV, you should first get tested for HIV. You should then be tested every 3 months for as long as you are taking the medicine. Pregnancy If you are about to stop having your period (premenopausal) and you may become pregnant, seek counseling before you get pregnant. Take 400 to 800 micrograms (mcg) of folic acid every day if you become pregnant. Ask for birth control (contraception) if you want to prevent pregnancy. Osteoporosis and menopause Osteoporosis is a disease in which the bones lose minerals and strength with aging.  This can result in bone fractures. If you are 37 years old or older, or if you are at risk for osteoporosis and fractures, ask your health care provider if you should: Be screened for bone loss. Take a calcium or vitamin D supplement to lower your risk of fractures. Be given hormone replacement therapy (HRT) to treat symptoms of menopause. Follow these instructions at home: Alcohol use Do not drink alcohol if: Your health care provider tells you not to drink. You are pregnant, may be pregnant, or are planning to become pregnant. If you drink alcohol: Limit how much you have to: 0-1 drink a day. Know how much alcohol is in your drink. In the [place] one drink equals one 12 oz bottle of beer (355 mL),  one 5 oz glass of wine (148 mL), or one 1 oz glass of hard liquor (44 mL). Lifestyle Do not use any products that contain nicotine or tobacco. These products include cigarettes, chewing tobacco, and vaping devices, such as e-cigarettes. If you need help quitting, ask your health care provider. Do not use street drugs. Do not share needles. Ask your health care provider for help if you need support or information about quitting drugs. General instructions Schedule regular health, dental, and eye exams. Stay current with your vaccines. Tell your health care provider if: You often feel depressed. You have ever been abused or do not feel safe at home. Summary Adopting a healthy lifestyle and getting preventive care are important in promoting health and wellness. Follow your health care provider's instructions about healthy diet, exercising, and getting tested or screened for diseases. Follow your health care provider's instructions on monitoring your cholesterol and blood pressure. This information is not intended to replace advice given to you by your health care provider. Make sure you discuss any questions you have with your health care provider. Document Revised: 05/14/2020 Document Reviewed: 05/14/2020 Elsevier Patient Education  2024 [employer].  Thank you for enrolling in [facility]. Please follow the instructions below to securely access your online medical record. MyChart allows you to send messages to your doctor, view your test results, manage appointments, and more.   How Do I Sign Up? In your Internet browser, go to [employer] and enter [email or web address]. Click on the Sign Up Now link in the Sign In box. You will see the New Member Sign Up page. Enter your MyChart Access Code exactly as it appears below. You will not need to use this code after you've completed the sign-up process. If you do not sign up before the expiration date, you must request a new  code.  MyChart Access Code: [email or web address] Expires: 12/18/2022  7:12 AM  Enter your Social Security Number ([id number]) and Date of Birth (mm/dd/yyyy) as indicated and click Submit. You will be taken to the next sign-up page. Create a MyChart ID. This will be your MyChart login ID and cannot be changed, so think of one that is secure and easy to remember. Create a Clinical biochemist. You can change your password at any time. Enter your Password Reset Question and Answer. This can be used at a later time if you forget your password.  Enter your e-mail address. You will receive e-mail notification when new information is available in MyChart. Click Sign Up. You can now view your medical record.   Additional Information Remember, MyChart is NOT to be used for urgent needs. For medical emergencies, dial 911.

## 2022-11-04 ENCOUNTER — Telehealth: Payer: Self-pay | Admitting: *Deleted

## 2022-11-04 NOTE — Progress Notes (Signed)
Care Coordination  Outreach Note  11/04/2022 Name: Joy Anderson MRN: [id number] DOB: 05-03-1950   Care Coordination Outreach Attempts: An unsuccessful telephone outreach was attempted today to offer the patient information about available care coordination services.  Follow Up Plan:  Additional outreach attempts will be made to offer the patient care coordination information and services.   Encounter Outcome:  No Answer  Burman Nieves, CCMA Care Coordination Care Guide Direct Dial: [phone]

## 2022-11-05 DIAGNOSIS — D631 Anemia in chronic kidney disease: Secondary | ICD-10-CM | POA: Diagnosis not present

## 2022-11-05 DIAGNOSIS — D509 Iron deficiency anemia, unspecified: Secondary | ICD-10-CM | POA: Diagnosis not present

## 2022-11-05 DIAGNOSIS — N186 End stage renal disease: Secondary | ICD-10-CM | POA: Diagnosis not present

## 2022-11-05 DIAGNOSIS — N2581 Secondary hyperparathyroidism of renal origin: Secondary | ICD-10-CM | POA: Diagnosis not present

## 2022-11-06 DIAGNOSIS — N186 End stage renal disease: Secondary | ICD-10-CM | POA: Diagnosis not present

## 2022-11-06 DIAGNOSIS — Z992 Dependence on renal dialysis: Secondary | ICD-10-CM | POA: Diagnosis not present

## 2022-11-06 NOTE — Progress Notes (Signed)
Care Coordination  Outreach Note  11/06/2022 Name: Joy Anderson MRN: [id number] DOB: [birth year]   Care Coordination Outreach Attempts: A second unsuccessful outreach was attempted today to offer the patient with information about available care coordination services.  Follow Up Plan:  Additional outreach attempts will be made to offer the patient care coordination information and services.   Encounter Outcome:  No Answer  Burman Nieves, CCMA Care Coordination Care Guide Direct Dial: [phone]

## 2022-11-07 DIAGNOSIS — N2581 Secondary hyperparathyroidism of renal origin: Secondary | ICD-10-CM | POA: Diagnosis not present

## 2022-11-07 DIAGNOSIS — N186 End stage renal disease: Secondary | ICD-10-CM | POA: Diagnosis not present

## 2022-11-07 DIAGNOSIS — D509 Iron deficiency anemia, unspecified: Secondary | ICD-10-CM | POA: Diagnosis not present

## 2022-11-07 DIAGNOSIS — D631 Anemia in chronic kidney disease: Secondary | ICD-10-CM | POA: Diagnosis not present

## 2022-11-10 DIAGNOSIS — D509 Iron deficiency anemia, unspecified: Secondary | ICD-10-CM | POA: Diagnosis not present

## 2022-11-10 DIAGNOSIS — N2581 Secondary hyperparathyroidism of renal origin: Secondary | ICD-10-CM | POA: Diagnosis not present

## 2022-11-10 DIAGNOSIS — N186 End stage renal disease: Secondary | ICD-10-CM | POA: Diagnosis not present

## 2022-11-10 DIAGNOSIS — R002 Palpitations: Secondary | ICD-10-CM | POA: Diagnosis not present

## 2022-11-10 DIAGNOSIS — D631 Anemia in chronic kidney disease: Secondary | ICD-10-CM | POA: Diagnosis not present

## 2022-11-10 DIAGNOSIS — I82409 Acute embolism and thrombosis of unspecified deep veins of unspecified lower extremity: Secondary | ICD-10-CM | POA: Diagnosis not present

## 2022-11-10 NOTE — Progress Notes (Signed)
  Care Coordination  Outreach Note  11/10/2022 Name: Joy Anderson MRN: [id number] DOB: Jan 03, 1951   Care Coordination Outreach Attempts: A third unsuccessful outreach was attempted today to offer the patient with information about available care coordination services.  Follow Up Plan:  No further outreach attempts will be made at this time. We have been unable to contact the patient to offer or enroll patient in care coordination services  Encounter Outcome:  No Answer  Burman Nieves, [facility] Care Coordination Care Guide Direct Dial: [phone]

## 2022-11-12 DIAGNOSIS — E119 Type 2 diabetes mellitus without complications: Secondary | ICD-10-CM | POA: Diagnosis not present

## 2022-11-12 DIAGNOSIS — N186 End stage renal disease: Secondary | ICD-10-CM | POA: Diagnosis not present

## 2022-11-12 DIAGNOSIS — N2581 Secondary hyperparathyroidism of renal origin: Secondary | ICD-10-CM | POA: Diagnosis not present

## 2022-11-12 DIAGNOSIS — D631 Anemia in chronic kidney disease: Secondary | ICD-10-CM | POA: Diagnosis not present

## 2022-11-12 DIAGNOSIS — D509 Iron deficiency anemia, unspecified: Secondary | ICD-10-CM | POA: Diagnosis not present

## 2022-11-14 DIAGNOSIS — D509 Iron deficiency anemia, unspecified: Secondary | ICD-10-CM | POA: Diagnosis not present

## 2022-11-14 DIAGNOSIS — N2581 Secondary hyperparathyroidism of renal origin: Secondary | ICD-10-CM | POA: Diagnosis not present

## 2022-11-14 DIAGNOSIS — D631 Anemia in chronic kidney disease: Secondary | ICD-10-CM | POA: Diagnosis not present

## 2022-11-14 DIAGNOSIS — N186 End stage renal disease: Secondary | ICD-10-CM | POA: Diagnosis not present

## 2022-11-17 DIAGNOSIS — D509 Iron deficiency anemia, unspecified: Secondary | ICD-10-CM | POA: Diagnosis not present

## 2022-11-17 DIAGNOSIS — D631 Anemia in chronic kidney disease: Secondary | ICD-10-CM | POA: Diagnosis not present

## 2022-11-17 DIAGNOSIS — N186 End stage renal disease: Secondary | ICD-10-CM | POA: Diagnosis not present

## 2022-11-17 DIAGNOSIS — I82409 Acute embolism and thrombosis of unspecified deep veins of unspecified lower extremity: Secondary | ICD-10-CM | POA: Diagnosis not present

## 2022-11-17 DIAGNOSIS — N2581 Secondary hyperparathyroidism of renal origin: Secondary | ICD-10-CM | POA: Diagnosis not present

## 2022-11-19 DIAGNOSIS — N186 End stage renal disease: Secondary | ICD-10-CM | POA: Diagnosis not present

## 2022-11-19 DIAGNOSIS — D509 Iron deficiency anemia, unspecified: Secondary | ICD-10-CM | POA: Diagnosis not present

## 2022-11-19 DIAGNOSIS — D631 Anemia in chronic kidney disease: Secondary | ICD-10-CM | POA: Diagnosis not present

## 2022-11-19 DIAGNOSIS — N2581 Secondary hyperparathyroidism of renal origin: Secondary | ICD-10-CM | POA: Diagnosis not present

## 2022-11-21 DIAGNOSIS — D509 Iron deficiency anemia, unspecified: Secondary | ICD-10-CM | POA: Diagnosis not present

## 2022-11-21 DIAGNOSIS — N186 End stage renal disease: Secondary | ICD-10-CM | POA: Diagnosis not present

## 2022-11-21 DIAGNOSIS — N2581 Secondary hyperparathyroidism of renal origin: Secondary | ICD-10-CM | POA: Diagnosis not present

## 2022-11-21 DIAGNOSIS — D631 Anemia in chronic kidney disease: Secondary | ICD-10-CM | POA: Diagnosis not present

## 2022-11-24 DIAGNOSIS — I82409 Acute embolism and thrombosis of unspecified deep veins of unspecified lower extremity: Secondary | ICD-10-CM | POA: Diagnosis not present

## 2022-11-24 DIAGNOSIS — D631 Anemia in chronic kidney disease: Secondary | ICD-10-CM | POA: Diagnosis not present

## 2022-11-24 DIAGNOSIS — N2581 Secondary hyperparathyroidism of renal origin: Secondary | ICD-10-CM | POA: Diagnosis not present

## 2022-11-24 DIAGNOSIS — N186 End stage renal disease: Secondary | ICD-10-CM | POA: Diagnosis not present

## 2022-11-24 DIAGNOSIS — D509 Iron deficiency anemia, unspecified: Secondary | ICD-10-CM | POA: Diagnosis not present

## 2022-11-26 DIAGNOSIS — N2581 Secondary hyperparathyroidism of renal origin: Secondary | ICD-10-CM | POA: Diagnosis not present

## 2022-11-26 DIAGNOSIS — D631 Anemia in chronic kidney disease: Secondary | ICD-10-CM | POA: Diagnosis not present

## 2022-11-26 DIAGNOSIS — D509 Iron deficiency anemia, unspecified: Secondary | ICD-10-CM | POA: Diagnosis not present

## 2022-11-26 DIAGNOSIS — N186 End stage renal disease: Secondary | ICD-10-CM | POA: Diagnosis not present

## 2022-11-27 DIAGNOSIS — R002 Palpitations: Secondary | ICD-10-CM | POA: Diagnosis not present

## 2022-11-27 DIAGNOSIS — N186 End stage renal disease: Secondary | ICD-10-CM | POA: Diagnosis not present

## 2022-11-27 DIAGNOSIS — I4719 Other supraventricular tachycardia: Secondary | ICD-10-CM | POA: Diagnosis not present

## 2022-11-28 DIAGNOSIS — D631 Anemia in chronic kidney disease: Secondary | ICD-10-CM | POA: Diagnosis not present

## 2022-11-28 DIAGNOSIS — N2581 Secondary hyperparathyroidism of renal origin: Secondary | ICD-10-CM | POA: Diagnosis not present

## 2022-11-28 DIAGNOSIS — N186 End stage renal disease: Secondary | ICD-10-CM | POA: Diagnosis not present

## 2022-11-28 DIAGNOSIS — D509 Iron deficiency anemia, unspecified: Secondary | ICD-10-CM | POA: Diagnosis not present

## 2022-11-30 DIAGNOSIS — N186 End stage renal disease: Secondary | ICD-10-CM | POA: Diagnosis not present

## 2022-11-30 DIAGNOSIS — D631 Anemia in chronic kidney disease: Secondary | ICD-10-CM | POA: Diagnosis not present

## 2022-11-30 DIAGNOSIS — D509 Iron deficiency anemia, unspecified: Secondary | ICD-10-CM | POA: Diagnosis not present

## 2022-11-30 DIAGNOSIS — N2581 Secondary hyperparathyroidism of renal origin: Secondary | ICD-10-CM | POA: Diagnosis not present

## 2022-12-02 DIAGNOSIS — I82409 Acute embolism and thrombosis of unspecified deep veins of unspecified lower extremity: Secondary | ICD-10-CM | POA: Diagnosis not present

## 2022-12-02 DIAGNOSIS — D509 Iron deficiency anemia, unspecified: Secondary | ICD-10-CM | POA: Diagnosis not present

## 2022-12-02 DIAGNOSIS — N2581 Secondary hyperparathyroidism of renal origin: Secondary | ICD-10-CM | POA: Diagnosis not present

## 2022-12-02 DIAGNOSIS — D631 Anemia in chronic kidney disease: Secondary | ICD-10-CM | POA: Diagnosis not present

## 2022-12-02 DIAGNOSIS — N186 End stage renal disease: Secondary | ICD-10-CM | POA: Diagnosis not present

## 2022-12-05 DIAGNOSIS — D631 Anemia in chronic kidney disease: Secondary | ICD-10-CM | POA: Diagnosis not present

## 2022-12-05 DIAGNOSIS — D509 Iron deficiency anemia, unspecified: Secondary | ICD-10-CM | POA: Diagnosis not present

## 2022-12-05 DIAGNOSIS — N186 End stage renal disease: Secondary | ICD-10-CM | POA: Diagnosis not present

## 2022-12-05 DIAGNOSIS — N2581 Secondary hyperparathyroidism of renal origin: Secondary | ICD-10-CM | POA: Diagnosis not present

## 2022-12-06 DIAGNOSIS — N186 End stage renal disease: Secondary | ICD-10-CM | POA: Diagnosis not present

## 2022-12-06 DIAGNOSIS — Z992 Dependence on renal dialysis: Secondary | ICD-10-CM | POA: Diagnosis not present

## 2022-12-08 DIAGNOSIS — N186 End stage renal disease: Secondary | ICD-10-CM | POA: Diagnosis not present

## 2022-12-08 DIAGNOSIS — N2581 Secondary hyperparathyroidism of renal origin: Secondary | ICD-10-CM | POA: Diagnosis not present

## 2022-12-08 DIAGNOSIS — D631 Anemia in chronic kidney disease: Secondary | ICD-10-CM | POA: Diagnosis not present

## 2022-12-08 DIAGNOSIS — I82409 Acute embolism and thrombosis of unspecified deep veins of unspecified lower extremity: Secondary | ICD-10-CM | POA: Diagnosis not present

## 2022-12-08 DIAGNOSIS — D509 Iron deficiency anemia, unspecified: Secondary | ICD-10-CM | POA: Diagnosis not present

## 2022-12-10 DIAGNOSIS — E119 Type 2 diabetes mellitus without complications: Secondary | ICD-10-CM | POA: Diagnosis not present

## 2022-12-10 DIAGNOSIS — D509 Iron deficiency anemia, unspecified: Secondary | ICD-10-CM | POA: Diagnosis not present

## 2022-12-10 DIAGNOSIS — D631 Anemia in chronic kidney disease: Secondary | ICD-10-CM | POA: Diagnosis not present

## 2022-12-10 DIAGNOSIS — N2581 Secondary hyperparathyroidism of renal origin: Secondary | ICD-10-CM | POA: Diagnosis not present

## 2022-12-10 DIAGNOSIS — N186 End stage renal disease: Secondary | ICD-10-CM | POA: Diagnosis not present

## 2022-12-12 DIAGNOSIS — N186 End stage renal disease: Secondary | ICD-10-CM | POA: Diagnosis not present

## 2022-12-12 DIAGNOSIS — N2581 Secondary hyperparathyroidism of renal origin: Secondary | ICD-10-CM | POA: Diagnosis not present

## 2022-12-12 DIAGNOSIS — D509 Iron deficiency anemia, unspecified: Secondary | ICD-10-CM | POA: Diagnosis not present

## 2022-12-12 DIAGNOSIS — D631 Anemia in chronic kidney disease: Secondary | ICD-10-CM | POA: Diagnosis not present

## 2022-12-15 DIAGNOSIS — I82409 Acute embolism and thrombosis of unspecified deep veins of unspecified lower extremity: Secondary | ICD-10-CM | POA: Diagnosis not present

## 2022-12-15 DIAGNOSIS — D509 Iron deficiency anemia, unspecified: Secondary | ICD-10-CM | POA: Diagnosis not present

## 2022-12-15 DIAGNOSIS — N2581 Secondary hyperparathyroidism of renal origin: Secondary | ICD-10-CM | POA: Diagnosis not present

## 2022-12-15 DIAGNOSIS — N186 End stage renal disease: Secondary | ICD-10-CM | POA: Diagnosis not present

## 2022-12-15 DIAGNOSIS — D631 Anemia in chronic kidney disease: Secondary | ICD-10-CM | POA: Diagnosis not present

## 2022-12-16 DIAGNOSIS — Z79899 Other long term (current) drug therapy: Secondary | ICD-10-CM | POA: Diagnosis not present

## 2022-12-16 DIAGNOSIS — M329 Systemic lupus erythematosus, unspecified: Secondary | ICD-10-CM | POA: Diagnosis not present

## 2022-12-16 DIAGNOSIS — H401131 Primary open-angle glaucoma, bilateral, mild stage: Secondary | ICD-10-CM | POA: Diagnosis not present

## 2022-12-17 DIAGNOSIS — N186 End stage renal disease: Secondary | ICD-10-CM | POA: Diagnosis not present

## 2022-12-17 DIAGNOSIS — D631 Anemia in chronic kidney disease: Secondary | ICD-10-CM | POA: Diagnosis not present

## 2022-12-17 DIAGNOSIS — N2581 Secondary hyperparathyroidism of renal origin: Secondary | ICD-10-CM | POA: Diagnosis not present

## 2022-12-17 DIAGNOSIS — D509 Iron deficiency anemia, unspecified: Secondary | ICD-10-CM | POA: Diagnosis not present

## 2022-12-19 DIAGNOSIS — N186 End stage renal disease: Secondary | ICD-10-CM | POA: Diagnosis not present

## 2022-12-19 DIAGNOSIS — D509 Iron deficiency anemia, unspecified: Secondary | ICD-10-CM | POA: Diagnosis not present

## 2022-12-19 DIAGNOSIS — D631 Anemia in chronic kidney disease: Secondary | ICD-10-CM | POA: Diagnosis not present

## 2022-12-19 DIAGNOSIS — N2581 Secondary hyperparathyroidism of renal origin: Secondary | ICD-10-CM | POA: Diagnosis not present

## 2022-12-22 DIAGNOSIS — I82409 Acute embolism and thrombosis of unspecified deep veins of unspecified lower extremity: Secondary | ICD-10-CM | POA: Diagnosis not present

## 2022-12-22 DIAGNOSIS — D631 Anemia in chronic kidney disease: Secondary | ICD-10-CM | POA: Diagnosis not present

## 2022-12-22 DIAGNOSIS — N186 End stage renal disease: Secondary | ICD-10-CM | POA: Diagnosis not present

## 2022-12-22 DIAGNOSIS — D509 Iron deficiency anemia, unspecified: Secondary | ICD-10-CM | POA: Diagnosis not present

## 2022-12-22 DIAGNOSIS — N2581 Secondary hyperparathyroidism of renal origin: Secondary | ICD-10-CM | POA: Diagnosis not present

## 2022-12-24 DIAGNOSIS — N2581 Secondary hyperparathyroidism of renal origin: Secondary | ICD-10-CM | POA: Diagnosis not present

## 2022-12-24 DIAGNOSIS — D631 Anemia in chronic kidney disease: Secondary | ICD-10-CM | POA: Diagnosis not present

## 2022-12-24 DIAGNOSIS — N186 End stage renal disease: Secondary | ICD-10-CM | POA: Diagnosis not present

## 2022-12-24 DIAGNOSIS — D509 Iron deficiency anemia, unspecified: Secondary | ICD-10-CM | POA: Diagnosis not present

## 2022-12-26 DIAGNOSIS — D509 Iron deficiency anemia, unspecified: Secondary | ICD-10-CM | POA: Diagnosis not present

## 2022-12-26 DIAGNOSIS — N2581 Secondary hyperparathyroidism of renal origin: Secondary | ICD-10-CM | POA: Diagnosis not present

## 2022-12-26 DIAGNOSIS — D631 Anemia in chronic kidney disease: Secondary | ICD-10-CM | POA: Diagnosis not present

## 2022-12-26 DIAGNOSIS — N186 End stage renal disease: Secondary | ICD-10-CM | POA: Diagnosis not present

## 2022-12-28 DIAGNOSIS — D509 Iron deficiency anemia, unspecified: Secondary | ICD-10-CM | POA: Diagnosis not present

## 2022-12-28 DIAGNOSIS — N186 End stage renal disease: Secondary | ICD-10-CM | POA: Diagnosis not present

## 2022-12-28 DIAGNOSIS — N2581 Secondary hyperparathyroidism of renal origin: Secondary | ICD-10-CM | POA: Diagnosis not present

## 2022-12-28 DIAGNOSIS — D631 Anemia in chronic kidney disease: Secondary | ICD-10-CM | POA: Diagnosis not present

## 2022-12-30 DIAGNOSIS — D509 Iron deficiency anemia, unspecified: Secondary | ICD-10-CM | POA: Diagnosis not present

## 2022-12-30 DIAGNOSIS — N186 End stage renal disease: Secondary | ICD-10-CM | POA: Diagnosis not present

## 2022-12-30 DIAGNOSIS — D631 Anemia in chronic kidney disease: Secondary | ICD-10-CM | POA: Diagnosis not present

## 2022-12-30 DIAGNOSIS — I82409 Acute embolism and thrombosis of unspecified deep veins of unspecified lower extremity: Secondary | ICD-10-CM | POA: Diagnosis not present

## 2022-12-30 DIAGNOSIS — N2581 Secondary hyperparathyroidism of renal origin: Secondary | ICD-10-CM | POA: Diagnosis not present

## 2023-01-01 DIAGNOSIS — I082 Rheumatic disorders of both aortic and tricuspid valves: Secondary | ICD-10-CM | POA: Diagnosis not present

## 2023-01-02 DIAGNOSIS — N186 End stage renal disease: Secondary | ICD-10-CM | POA: Diagnosis not present

## 2023-01-02 DIAGNOSIS — D509 Iron deficiency anemia, unspecified: Secondary | ICD-10-CM | POA: Diagnosis not present

## 2023-01-02 DIAGNOSIS — D631 Anemia in chronic kidney disease: Secondary | ICD-10-CM | POA: Diagnosis not present

## 2023-01-02 DIAGNOSIS — N2581 Secondary hyperparathyroidism of renal origin: Secondary | ICD-10-CM | POA: Diagnosis not present

## 2023-01-04 DIAGNOSIS — N2581 Secondary hyperparathyroidism of renal origin: Secondary | ICD-10-CM | POA: Diagnosis not present

## 2023-01-04 DIAGNOSIS — N186 End stage renal disease: Secondary | ICD-10-CM | POA: Diagnosis not present

## 2023-01-04 DIAGNOSIS — D631 Anemia in chronic kidney disease: Secondary | ICD-10-CM | POA: Diagnosis not present

## 2023-01-04 DIAGNOSIS — D509 Iron deficiency anemia, unspecified: Secondary | ICD-10-CM | POA: Diagnosis not present

## 2023-01-05 DIAGNOSIS — N2581 Secondary hyperparathyroidism of renal origin: Secondary | ICD-10-CM | POA: Diagnosis not present

## 2023-01-05 DIAGNOSIS — N186 End stage renal disease: Secondary | ICD-10-CM | POA: Diagnosis not present

## 2023-01-05 DIAGNOSIS — D631 Anemia in chronic kidney disease: Secondary | ICD-10-CM | POA: Diagnosis not present

## 2023-01-05 DIAGNOSIS — I82409 Acute embolism and thrombosis of unspecified deep veins of unspecified lower extremity: Secondary | ICD-10-CM | POA: Diagnosis not present

## 2023-01-05 DIAGNOSIS — D509 Iron deficiency anemia, unspecified: Secondary | ICD-10-CM | POA: Diagnosis not present

## 2023-01-06 DIAGNOSIS — Z992 Dependence on renal dialysis: Secondary | ICD-10-CM | POA: Diagnosis not present

## 2023-01-06 DIAGNOSIS — E1169 Type 2 diabetes mellitus with other specified complication: Secondary | ICD-10-CM | POA: Diagnosis not present

## 2023-01-06 DIAGNOSIS — I120 Hypertensive chronic kidney disease with stage 5 chronic kidney disease or end stage renal disease: Secondary | ICD-10-CM | POA: Diagnosis not present

## 2023-01-06 DIAGNOSIS — Z860100 Personal history of colon polyps, unspecified: Secondary | ICD-10-CM | POA: Diagnosis not present

## 2023-01-06 DIAGNOSIS — I272 Pulmonary hypertension, unspecified: Secondary | ICD-10-CM | POA: Diagnosis not present

## 2023-01-06 DIAGNOSIS — Z08 Encounter for follow-up examination after completed treatment for malignant neoplasm: Secondary | ICD-10-CM | POA: Diagnosis not present

## 2023-01-06 DIAGNOSIS — E785 Hyperlipidemia, unspecified: Secondary | ICD-10-CM | POA: Diagnosis not present

## 2023-01-06 DIAGNOSIS — N186 End stage renal disease: Secondary | ICD-10-CM | POA: Diagnosis not present

## 2023-01-06 DIAGNOSIS — Z800 Family history of malignant neoplasm of digestive organs: Secondary | ICD-10-CM | POA: Diagnosis not present

## 2023-01-06 DIAGNOSIS — Z7901 Long term (current) use of anticoagulants: Secondary | ICD-10-CM | POA: Diagnosis not present

## 2023-01-06 DIAGNOSIS — K5730 Diverticulosis of large intestine without perforation or abscess without bleeding: Secondary | ICD-10-CM | POA: Diagnosis not present

## 2023-01-06 DIAGNOSIS — E1122 Type 2 diabetes mellitus with diabetic chronic kidney disease: Secondary | ICD-10-CM | POA: Diagnosis not present

## 2023-01-06 DIAGNOSIS — K648 Other hemorrhoids: Secondary | ICD-10-CM | POA: Diagnosis not present

## 2023-01-06 DIAGNOSIS — Z860101 Personal history of adenomatous and serrated colon polyps: Secondary | ICD-10-CM | POA: Diagnosis not present

## 2023-01-06 LAB — HM COLONOSCOPY

## 2023-01-09 DIAGNOSIS — D509 Iron deficiency anemia, unspecified: Secondary | ICD-10-CM | POA: Diagnosis not present

## 2023-01-09 DIAGNOSIS — N186 End stage renal disease: Secondary | ICD-10-CM | POA: Diagnosis not present

## 2023-01-09 DIAGNOSIS — I959 Hypotension, unspecified: Secondary | ICD-10-CM | POA: Diagnosis not present

## 2023-01-09 DIAGNOSIS — N2581 Secondary hyperparathyroidism of renal origin: Secondary | ICD-10-CM | POA: Diagnosis not present

## 2023-01-09 DIAGNOSIS — D631 Anemia in chronic kidney disease: Secondary | ICD-10-CM | POA: Diagnosis not present

## 2023-01-09 DIAGNOSIS — R252 Cramp and spasm: Secondary | ICD-10-CM | POA: Diagnosis not present

## 2023-01-12 DIAGNOSIS — I959 Hypotension, unspecified: Secondary | ICD-10-CM | POA: Diagnosis not present

## 2023-01-12 DIAGNOSIS — D509 Iron deficiency anemia, unspecified: Secondary | ICD-10-CM | POA: Diagnosis not present

## 2023-01-12 DIAGNOSIS — D631 Anemia in chronic kidney disease: Secondary | ICD-10-CM | POA: Diagnosis not present

## 2023-01-12 DIAGNOSIS — N186 End stage renal disease: Secondary | ICD-10-CM | POA: Diagnosis not present

## 2023-01-12 DIAGNOSIS — N2581 Secondary hyperparathyroidism of renal origin: Secondary | ICD-10-CM | POA: Diagnosis not present

## 2023-01-12 DIAGNOSIS — R252 Cramp and spasm: Secondary | ICD-10-CM | POA: Diagnosis not present

## 2023-01-12 DIAGNOSIS — I82409 Acute embolism and thrombosis of unspecified deep veins of unspecified lower extremity: Secondary | ICD-10-CM | POA: Diagnosis not present

## 2023-01-14 DIAGNOSIS — R252 Cramp and spasm: Secondary | ICD-10-CM | POA: Diagnosis not present

## 2023-01-14 DIAGNOSIS — D509 Iron deficiency anemia, unspecified: Secondary | ICD-10-CM | POA: Diagnosis not present

## 2023-01-14 DIAGNOSIS — E119 Type 2 diabetes mellitus without complications: Secondary | ICD-10-CM | POA: Diagnosis not present

## 2023-01-14 DIAGNOSIS — D631 Anemia in chronic kidney disease: Secondary | ICD-10-CM | POA: Diagnosis not present

## 2023-01-14 DIAGNOSIS — N2581 Secondary hyperparathyroidism of renal origin: Secondary | ICD-10-CM | POA: Diagnosis not present

## 2023-01-14 DIAGNOSIS — I959 Hypotension, unspecified: Secondary | ICD-10-CM | POA: Diagnosis not present

## 2023-01-14 DIAGNOSIS — N186 End stage renal disease: Secondary | ICD-10-CM | POA: Diagnosis not present

## 2023-01-16 DIAGNOSIS — R252 Cramp and spasm: Secondary | ICD-10-CM | POA: Diagnosis not present

## 2023-01-16 DIAGNOSIS — N186 End stage renal disease: Secondary | ICD-10-CM | POA: Diagnosis not present

## 2023-01-16 DIAGNOSIS — I959 Hypotension, unspecified: Secondary | ICD-10-CM | POA: Diagnosis not present

## 2023-01-16 DIAGNOSIS — D631 Anemia in chronic kidney disease: Secondary | ICD-10-CM | POA: Diagnosis not present

## 2023-01-16 DIAGNOSIS — N2581 Secondary hyperparathyroidism of renal origin: Secondary | ICD-10-CM | POA: Diagnosis not present

## 2023-01-16 DIAGNOSIS — D509 Iron deficiency anemia, unspecified: Secondary | ICD-10-CM | POA: Diagnosis not present

## 2023-01-19 DIAGNOSIS — I82409 Acute embolism and thrombosis of unspecified deep veins of unspecified lower extremity: Secondary | ICD-10-CM | POA: Diagnosis not present

## 2023-01-19 DIAGNOSIS — R252 Cramp and spasm: Secondary | ICD-10-CM | POA: Diagnosis not present

## 2023-01-19 DIAGNOSIS — N2581 Secondary hyperparathyroidism of renal origin: Secondary | ICD-10-CM | POA: Diagnosis not present

## 2023-01-19 DIAGNOSIS — I959 Hypotension, unspecified: Secondary | ICD-10-CM | POA: Diagnosis not present

## 2023-01-19 DIAGNOSIS — D631 Anemia in chronic kidney disease: Secondary | ICD-10-CM | POA: Diagnosis not present

## 2023-01-19 DIAGNOSIS — N186 End stage renal disease: Secondary | ICD-10-CM | POA: Diagnosis not present

## 2023-01-19 DIAGNOSIS — D509 Iron deficiency anemia, unspecified: Secondary | ICD-10-CM | POA: Diagnosis not present

## 2023-01-21 DIAGNOSIS — N2581 Secondary hyperparathyroidism of renal origin: Secondary | ICD-10-CM | POA: Diagnosis not present

## 2023-01-21 DIAGNOSIS — N186 End stage renal disease: Secondary | ICD-10-CM | POA: Diagnosis not present

## 2023-01-21 DIAGNOSIS — I959 Hypotension, unspecified: Secondary | ICD-10-CM | POA: Diagnosis not present

## 2023-01-21 DIAGNOSIS — D509 Iron deficiency anemia, unspecified: Secondary | ICD-10-CM | POA: Diagnosis not present

## 2023-01-21 DIAGNOSIS — R252 Cramp and spasm: Secondary | ICD-10-CM | POA: Diagnosis not present

## 2023-01-21 DIAGNOSIS — D631 Anemia in chronic kidney disease: Secondary | ICD-10-CM | POA: Diagnosis not present

## 2023-01-23 DIAGNOSIS — R252 Cramp and spasm: Secondary | ICD-10-CM | POA: Diagnosis not present

## 2023-01-23 DIAGNOSIS — N186 End stage renal disease: Secondary | ICD-10-CM | POA: Diagnosis not present

## 2023-01-23 DIAGNOSIS — D509 Iron deficiency anemia, unspecified: Secondary | ICD-10-CM | POA: Diagnosis not present

## 2023-01-23 DIAGNOSIS — N2581 Secondary hyperparathyroidism of renal origin: Secondary | ICD-10-CM | POA: Diagnosis not present

## 2023-01-23 DIAGNOSIS — D631 Anemia in chronic kidney disease: Secondary | ICD-10-CM | POA: Diagnosis not present

## 2023-01-23 DIAGNOSIS — I959 Hypotension, unspecified: Secondary | ICD-10-CM | POA: Diagnosis not present

## 2023-01-26 DIAGNOSIS — D631 Anemia in chronic kidney disease: Secondary | ICD-10-CM | POA: Diagnosis not present

## 2023-01-26 DIAGNOSIS — N186 End stage renal disease: Secondary | ICD-10-CM | POA: Diagnosis not present

## 2023-01-26 DIAGNOSIS — R252 Cramp and spasm: Secondary | ICD-10-CM | POA: Diagnosis not present

## 2023-01-26 DIAGNOSIS — N2581 Secondary hyperparathyroidism of renal origin: Secondary | ICD-10-CM | POA: Diagnosis not present

## 2023-01-26 DIAGNOSIS — I82409 Acute embolism and thrombosis of unspecified deep veins of unspecified lower extremity: Secondary | ICD-10-CM | POA: Diagnosis not present

## 2023-01-26 DIAGNOSIS — I959 Hypotension, unspecified: Secondary | ICD-10-CM | POA: Diagnosis not present

## 2023-01-26 DIAGNOSIS — D509 Iron deficiency anemia, unspecified: Secondary | ICD-10-CM | POA: Diagnosis not present

## 2023-01-28 DIAGNOSIS — R252 Cramp and spasm: Secondary | ICD-10-CM | POA: Diagnosis not present

## 2023-01-28 DIAGNOSIS — Z114 Encounter for screening for human immunodeficiency virus [HIV]: Secondary | ICD-10-CM | POA: Diagnosis not present

## 2023-01-28 DIAGNOSIS — Z1159 Encounter for screening for other viral diseases: Secondary | ICD-10-CM | POA: Diagnosis not present

## 2023-01-28 DIAGNOSIS — D631 Anemia in chronic kidney disease: Secondary | ICD-10-CM | POA: Diagnosis not present

## 2023-01-28 DIAGNOSIS — N186 End stage renal disease: Secondary | ICD-10-CM | POA: Diagnosis not present

## 2023-01-28 DIAGNOSIS — N2581 Secondary hyperparathyroidism of renal origin: Secondary | ICD-10-CM | POA: Diagnosis not present

## 2023-01-28 DIAGNOSIS — I959 Hypotension, unspecified: Secondary | ICD-10-CM | POA: Diagnosis not present

## 2023-01-28 DIAGNOSIS — D509 Iron deficiency anemia, unspecified: Secondary | ICD-10-CM | POA: Diagnosis not present

## 2023-01-30 DIAGNOSIS — N186 End stage renal disease: Secondary | ICD-10-CM | POA: Diagnosis not present

## 2023-01-30 DIAGNOSIS — D509 Iron deficiency anemia, unspecified: Secondary | ICD-10-CM | POA: Diagnosis not present

## 2023-01-30 DIAGNOSIS — N2581 Secondary hyperparathyroidism of renal origin: Secondary | ICD-10-CM | POA: Diagnosis not present

## 2023-01-30 DIAGNOSIS — I959 Hypotension, unspecified: Secondary | ICD-10-CM | POA: Diagnosis not present

## 2023-01-30 DIAGNOSIS — D631 Anemia in chronic kidney disease: Secondary | ICD-10-CM | POA: Diagnosis not present

## 2023-01-30 DIAGNOSIS — R252 Cramp and spasm: Secondary | ICD-10-CM | POA: Diagnosis not present

## 2023-02-02 DIAGNOSIS — D509 Iron deficiency anemia, unspecified: Secondary | ICD-10-CM | POA: Diagnosis not present

## 2023-02-02 DIAGNOSIS — D631 Anemia in chronic kidney disease: Secondary | ICD-10-CM | POA: Diagnosis not present

## 2023-02-02 DIAGNOSIS — R252 Cramp and spasm: Secondary | ICD-10-CM | POA: Diagnosis not present

## 2023-02-02 DIAGNOSIS — N2581 Secondary hyperparathyroidism of renal origin: Secondary | ICD-10-CM | POA: Diagnosis not present

## 2023-02-02 DIAGNOSIS — N186 End stage renal disease: Secondary | ICD-10-CM | POA: Diagnosis not present

## 2023-02-02 DIAGNOSIS — I82409 Acute embolism and thrombosis of unspecified deep veins of unspecified lower extremity: Secondary | ICD-10-CM | POA: Diagnosis not present

## 2023-02-02 DIAGNOSIS — I959 Hypotension, unspecified: Secondary | ICD-10-CM | POA: Diagnosis not present

## 2023-02-04 DIAGNOSIS — D509 Iron deficiency anemia, unspecified: Secondary | ICD-10-CM | POA: Diagnosis not present

## 2023-02-04 DIAGNOSIS — I959 Hypotension, unspecified: Secondary | ICD-10-CM | POA: Diagnosis not present

## 2023-02-04 DIAGNOSIS — D631 Anemia in chronic kidney disease: Secondary | ICD-10-CM | POA: Diagnosis not present

## 2023-02-04 DIAGNOSIS — R252 Cramp and spasm: Secondary | ICD-10-CM | POA: Diagnosis not present

## 2023-02-04 DIAGNOSIS — N186 End stage renal disease: Secondary | ICD-10-CM | POA: Diagnosis not present

## 2023-02-04 DIAGNOSIS — N2581 Secondary hyperparathyroidism of renal origin: Secondary | ICD-10-CM | POA: Diagnosis not present

## 2023-02-06 DIAGNOSIS — I959 Hypotension, unspecified: Secondary | ICD-10-CM | POA: Diagnosis not present

## 2023-02-06 DIAGNOSIS — Z992 Dependence on renal dialysis: Secondary | ICD-10-CM | POA: Diagnosis not present

## 2023-02-06 DIAGNOSIS — N186 End stage renal disease: Secondary | ICD-10-CM | POA: Diagnosis not present

## 2023-02-06 DIAGNOSIS — R252 Cramp and spasm: Secondary | ICD-10-CM | POA: Diagnosis not present

## 2023-02-06 DIAGNOSIS — D631 Anemia in chronic kidney disease: Secondary | ICD-10-CM | POA: Diagnosis not present

## 2023-02-06 DIAGNOSIS — D509 Iron deficiency anemia, unspecified: Secondary | ICD-10-CM | POA: Diagnosis not present

## 2023-02-06 DIAGNOSIS — N2581 Secondary hyperparathyroidism of renal origin: Secondary | ICD-10-CM | POA: Diagnosis not present

## 2023-02-09 DIAGNOSIS — Z114 Encounter for screening for human immunodeficiency virus [HIV]: Secondary | ICD-10-CM | POA: Diagnosis not present

## 2023-02-09 DIAGNOSIS — Z1159 Encounter for screening for other viral diseases: Secondary | ICD-10-CM | POA: Diagnosis not present

## 2023-02-09 DIAGNOSIS — D631 Anemia in chronic kidney disease: Secondary | ICD-10-CM | POA: Diagnosis not present

## 2023-02-09 DIAGNOSIS — N2581 Secondary hyperparathyroidism of renal origin: Secondary | ICD-10-CM | POA: Diagnosis not present

## 2023-02-09 DIAGNOSIS — D509 Iron deficiency anemia, unspecified: Secondary | ICD-10-CM | POA: Diagnosis not present

## 2023-02-09 DIAGNOSIS — I82409 Acute embolism and thrombosis of unspecified deep veins of unspecified lower extremity: Secondary | ICD-10-CM | POA: Diagnosis not present

## 2023-02-09 DIAGNOSIS — N186 End stage renal disease: Secondary | ICD-10-CM | POA: Diagnosis not present

## 2023-02-11 DIAGNOSIS — N2581 Secondary hyperparathyroidism of renal origin: Secondary | ICD-10-CM | POA: Diagnosis not present

## 2023-02-11 DIAGNOSIS — D509 Iron deficiency anemia, unspecified: Secondary | ICD-10-CM | POA: Diagnosis not present

## 2023-02-11 DIAGNOSIS — E119 Type 2 diabetes mellitus without complications: Secondary | ICD-10-CM | POA: Diagnosis not present

## 2023-02-11 DIAGNOSIS — N186 End stage renal disease: Secondary | ICD-10-CM | POA: Diagnosis not present

## 2023-02-11 DIAGNOSIS — D631 Anemia in chronic kidney disease: Secondary | ICD-10-CM | POA: Diagnosis not present

## 2023-02-13 DIAGNOSIS — D631 Anemia in chronic kidney disease: Secondary | ICD-10-CM | POA: Diagnosis not present

## 2023-02-13 DIAGNOSIS — N186 End stage renal disease: Secondary | ICD-10-CM | POA: Diagnosis not present

## 2023-02-13 DIAGNOSIS — D509 Iron deficiency anemia, unspecified: Secondary | ICD-10-CM | POA: Diagnosis not present

## 2023-02-13 DIAGNOSIS — N2581 Secondary hyperparathyroidism of renal origin: Secondary | ICD-10-CM | POA: Diagnosis not present

## 2023-02-16 DIAGNOSIS — D509 Iron deficiency anemia, unspecified: Secondary | ICD-10-CM | POA: Diagnosis not present

## 2023-02-16 DIAGNOSIS — D631 Anemia in chronic kidney disease: Secondary | ICD-10-CM | POA: Diagnosis not present

## 2023-02-16 DIAGNOSIS — N2581 Secondary hyperparathyroidism of renal origin: Secondary | ICD-10-CM | POA: Diagnosis not present

## 2023-02-16 DIAGNOSIS — I82409 Acute embolism and thrombosis of unspecified deep veins of unspecified lower extremity: Secondary | ICD-10-CM | POA: Diagnosis not present

## 2023-02-16 DIAGNOSIS — N186 End stage renal disease: Secondary | ICD-10-CM | POA: Diagnosis not present

## 2023-02-18 DIAGNOSIS — I82409 Acute embolism and thrombosis of unspecified deep veins of unspecified lower extremity: Secondary | ICD-10-CM | POA: Diagnosis not present

## 2023-02-18 DIAGNOSIS — D509 Iron deficiency anemia, unspecified: Secondary | ICD-10-CM | POA: Diagnosis not present

## 2023-02-18 DIAGNOSIS — N186 End stage renal disease: Secondary | ICD-10-CM | POA: Diagnosis not present

## 2023-02-18 DIAGNOSIS — N2581 Secondary hyperparathyroidism of renal origin: Secondary | ICD-10-CM | POA: Diagnosis not present

## 2023-02-18 DIAGNOSIS — D631 Anemia in chronic kidney disease: Secondary | ICD-10-CM | POA: Diagnosis not present

## 2023-02-20 DIAGNOSIS — D509 Iron deficiency anemia, unspecified: Secondary | ICD-10-CM | POA: Diagnosis not present

## 2023-02-20 DIAGNOSIS — D631 Anemia in chronic kidney disease: Secondary | ICD-10-CM | POA: Diagnosis not present

## 2023-02-20 DIAGNOSIS — N2581 Secondary hyperparathyroidism of renal origin: Secondary | ICD-10-CM | POA: Diagnosis not present

## 2023-02-20 DIAGNOSIS — N186 End stage renal disease: Secondary | ICD-10-CM | POA: Diagnosis not present

## 2023-02-23 DIAGNOSIS — D631 Anemia in chronic kidney disease: Secondary | ICD-10-CM | POA: Diagnosis not present

## 2023-02-23 DIAGNOSIS — D509 Iron deficiency anemia, unspecified: Secondary | ICD-10-CM | POA: Diagnosis not present

## 2023-02-23 DIAGNOSIS — I82409 Acute embolism and thrombosis of unspecified deep veins of unspecified lower extremity: Secondary | ICD-10-CM | POA: Diagnosis not present

## 2023-02-23 DIAGNOSIS — N2581 Secondary hyperparathyroidism of renal origin: Secondary | ICD-10-CM | POA: Diagnosis not present

## 2023-02-23 DIAGNOSIS — N186 End stage renal disease: Secondary | ICD-10-CM | POA: Diagnosis not present

## 2023-02-25 DIAGNOSIS — N2581 Secondary hyperparathyroidism of renal origin: Secondary | ICD-10-CM | POA: Diagnosis not present

## 2023-02-25 DIAGNOSIS — D509 Iron deficiency anemia, unspecified: Secondary | ICD-10-CM | POA: Diagnosis not present

## 2023-02-25 DIAGNOSIS — D631 Anemia in chronic kidney disease: Secondary | ICD-10-CM | POA: Diagnosis not present

## 2023-02-25 DIAGNOSIS — N186 End stage renal disease: Secondary | ICD-10-CM | POA: Diagnosis not present

## 2023-02-27 DIAGNOSIS — N186 End stage renal disease: Secondary | ICD-10-CM | POA: Diagnosis not present

## 2023-02-27 DIAGNOSIS — D631 Anemia in chronic kidney disease: Secondary | ICD-10-CM | POA: Diagnosis not present

## 2023-02-27 DIAGNOSIS — N2581 Secondary hyperparathyroidism of renal origin: Secondary | ICD-10-CM | POA: Diagnosis not present

## 2023-02-27 DIAGNOSIS — D509 Iron deficiency anemia, unspecified: Secondary | ICD-10-CM | POA: Diagnosis not present

## 2023-03-02 DIAGNOSIS — I82409 Acute embolism and thrombosis of unspecified deep veins of unspecified lower extremity: Secondary | ICD-10-CM | POA: Diagnosis not present

## 2023-03-02 DIAGNOSIS — N186 End stage renal disease: Secondary | ICD-10-CM | POA: Diagnosis not present

## 2023-03-02 DIAGNOSIS — D631 Anemia in chronic kidney disease: Secondary | ICD-10-CM | POA: Diagnosis not present

## 2023-03-02 DIAGNOSIS — N2581 Secondary hyperparathyroidism of renal origin: Secondary | ICD-10-CM | POA: Diagnosis not present

## 2023-03-02 DIAGNOSIS — D509 Iron deficiency anemia, unspecified: Secondary | ICD-10-CM | POA: Diagnosis not present

## 2023-03-04 DIAGNOSIS — N186 End stage renal disease: Secondary | ICD-10-CM | POA: Diagnosis not present

## 2023-03-04 DIAGNOSIS — D631 Anemia in chronic kidney disease: Secondary | ICD-10-CM | POA: Diagnosis not present

## 2023-03-04 DIAGNOSIS — D509 Iron deficiency anemia, unspecified: Secondary | ICD-10-CM | POA: Diagnosis not present

## 2023-03-04 DIAGNOSIS — N2581 Secondary hyperparathyroidism of renal origin: Secondary | ICD-10-CM | POA: Diagnosis not present

## 2023-03-06 DIAGNOSIS — D631 Anemia in chronic kidney disease: Secondary | ICD-10-CM | POA: Diagnosis not present

## 2023-03-06 DIAGNOSIS — D509 Iron deficiency anemia, unspecified: Secondary | ICD-10-CM | POA: Diagnosis not present

## 2023-03-06 DIAGNOSIS — N2581 Secondary hyperparathyroidism of renal origin: Secondary | ICD-10-CM | POA: Diagnosis not present

## 2023-03-06 DIAGNOSIS — N186 End stage renal disease: Secondary | ICD-10-CM | POA: Diagnosis not present

## 2023-03-06 DIAGNOSIS — Z992 Dependence on renal dialysis: Secondary | ICD-10-CM | POA: Diagnosis not present

## 2023-03-09 DIAGNOSIS — D631 Anemia in chronic kidney disease: Secondary | ICD-10-CM | POA: Diagnosis not present

## 2023-03-09 DIAGNOSIS — D509 Iron deficiency anemia, unspecified: Secondary | ICD-10-CM | POA: Diagnosis not present

## 2023-03-09 DIAGNOSIS — I82409 Acute embolism and thrombosis of unspecified deep veins of unspecified lower extremity: Secondary | ICD-10-CM | POA: Diagnosis not present

## 2023-03-09 DIAGNOSIS — N186 End stage renal disease: Secondary | ICD-10-CM | POA: Diagnosis not present

## 2023-03-09 DIAGNOSIS — N2581 Secondary hyperparathyroidism of renal origin: Secondary | ICD-10-CM | POA: Diagnosis not present

## 2023-03-11 DIAGNOSIS — N186 End stage renal disease: Secondary | ICD-10-CM | POA: Diagnosis not present

## 2023-03-11 DIAGNOSIS — D631 Anemia in chronic kidney disease: Secondary | ICD-10-CM | POA: Diagnosis not present

## 2023-03-11 DIAGNOSIS — N2581 Secondary hyperparathyroidism of renal origin: Secondary | ICD-10-CM | POA: Diagnosis not present

## 2023-03-11 DIAGNOSIS — D509 Iron deficiency anemia, unspecified: Secondary | ICD-10-CM | POA: Diagnosis not present

## 2023-03-11 DIAGNOSIS — E119 Type 2 diabetes mellitus without complications: Secondary | ICD-10-CM | POA: Diagnosis not present

## 2023-03-13 DIAGNOSIS — N186 End stage renal disease: Secondary | ICD-10-CM | POA: Diagnosis not present

## 2023-03-13 DIAGNOSIS — D631 Anemia in chronic kidney disease: Secondary | ICD-10-CM | POA: Diagnosis not present

## 2023-03-13 DIAGNOSIS — D509 Iron deficiency anemia, unspecified: Secondary | ICD-10-CM | POA: Diagnosis not present

## 2023-03-13 DIAGNOSIS — N2581 Secondary hyperparathyroidism of renal origin: Secondary | ICD-10-CM | POA: Diagnosis not present

## 2023-03-16 DIAGNOSIS — I82409 Acute embolism and thrombosis of unspecified deep veins of unspecified lower extremity: Secondary | ICD-10-CM | POA: Diagnosis not present

## 2023-03-16 DIAGNOSIS — N186 End stage renal disease: Secondary | ICD-10-CM | POA: Diagnosis not present

## 2023-03-16 DIAGNOSIS — D631 Anemia in chronic kidney disease: Secondary | ICD-10-CM | POA: Diagnosis not present

## 2023-03-16 DIAGNOSIS — N2581 Secondary hyperparathyroidism of renal origin: Secondary | ICD-10-CM | POA: Diagnosis not present

## 2023-03-16 DIAGNOSIS — D509 Iron deficiency anemia, unspecified: Secondary | ICD-10-CM | POA: Diagnosis not present

## 2023-03-18 DIAGNOSIS — N2581 Secondary hyperparathyroidism of renal origin: Secondary | ICD-10-CM | POA: Diagnosis not present

## 2023-03-18 DIAGNOSIS — N186 End stage renal disease: Secondary | ICD-10-CM | POA: Diagnosis not present

## 2023-03-18 DIAGNOSIS — D631 Anemia in chronic kidney disease: Secondary | ICD-10-CM | POA: Diagnosis not present

## 2023-03-18 DIAGNOSIS — D509 Iron deficiency anemia, unspecified: Secondary | ICD-10-CM | POA: Diagnosis not present

## 2023-03-20 DIAGNOSIS — N2581 Secondary hyperparathyroidism of renal origin: Secondary | ICD-10-CM | POA: Diagnosis not present

## 2023-03-20 DIAGNOSIS — D509 Iron deficiency anemia, unspecified: Secondary | ICD-10-CM | POA: Diagnosis not present

## 2023-03-20 DIAGNOSIS — N186 End stage renal disease: Secondary | ICD-10-CM | POA: Diagnosis not present

## 2023-03-20 DIAGNOSIS — D631 Anemia in chronic kidney disease: Secondary | ICD-10-CM | POA: Diagnosis not present

## 2023-03-23 DIAGNOSIS — D509 Iron deficiency anemia, unspecified: Secondary | ICD-10-CM | POA: Diagnosis not present

## 2023-03-23 DIAGNOSIS — D631 Anemia in chronic kidney disease: Secondary | ICD-10-CM | POA: Diagnosis not present

## 2023-03-23 DIAGNOSIS — N2581 Secondary hyperparathyroidism of renal origin: Secondary | ICD-10-CM | POA: Diagnosis not present

## 2023-03-23 DIAGNOSIS — I82409 Acute embolism and thrombosis of unspecified deep veins of unspecified lower extremity: Secondary | ICD-10-CM | POA: Diagnosis not present

## 2023-03-23 DIAGNOSIS — N186 End stage renal disease: Secondary | ICD-10-CM | POA: Diagnosis not present

## 2023-03-25 DIAGNOSIS — D509 Iron deficiency anemia, unspecified: Secondary | ICD-10-CM | POA: Diagnosis not present

## 2023-03-25 DIAGNOSIS — N186 End stage renal disease: Secondary | ICD-10-CM | POA: Diagnosis not present

## 2023-03-25 DIAGNOSIS — D631 Anemia in chronic kidney disease: Secondary | ICD-10-CM | POA: Diagnosis not present

## 2023-03-25 DIAGNOSIS — N2581 Secondary hyperparathyroidism of renal origin: Secondary | ICD-10-CM | POA: Diagnosis not present

## 2023-03-27 DIAGNOSIS — D631 Anemia in chronic kidney disease: Secondary | ICD-10-CM | POA: Diagnosis not present

## 2023-03-27 DIAGNOSIS — N186 End stage renal disease: Secondary | ICD-10-CM | POA: Diagnosis not present

## 2023-03-27 DIAGNOSIS — D509 Iron deficiency anemia, unspecified: Secondary | ICD-10-CM | POA: Diagnosis not present

## 2023-03-27 DIAGNOSIS — N2581 Secondary hyperparathyroidism of renal origin: Secondary | ICD-10-CM | POA: Diagnosis not present

## 2023-03-30 DIAGNOSIS — N186 End stage renal disease: Secondary | ICD-10-CM | POA: Diagnosis not present

## 2023-03-30 DIAGNOSIS — D631 Anemia in chronic kidney disease: Secondary | ICD-10-CM | POA: Diagnosis not present

## 2023-03-30 DIAGNOSIS — N2581 Secondary hyperparathyroidism of renal origin: Secondary | ICD-10-CM | POA: Diagnosis not present

## 2023-03-30 DIAGNOSIS — D509 Iron deficiency anemia, unspecified: Secondary | ICD-10-CM | POA: Diagnosis not present

## 2023-03-30 DIAGNOSIS — I82409 Acute embolism and thrombosis of unspecified deep veins of unspecified lower extremity: Secondary | ICD-10-CM | POA: Diagnosis not present

## 2023-04-01 DIAGNOSIS — D631 Anemia in chronic kidney disease: Secondary | ICD-10-CM | POA: Diagnosis not present

## 2023-04-01 DIAGNOSIS — N186 End stage renal disease: Secondary | ICD-10-CM | POA: Diagnosis not present

## 2023-04-01 DIAGNOSIS — D509 Iron deficiency anemia, unspecified: Secondary | ICD-10-CM | POA: Diagnosis not present

## 2023-04-01 DIAGNOSIS — R002 Palpitations: Secondary | ICD-10-CM | POA: Diagnosis not present

## 2023-04-01 DIAGNOSIS — Z1330 Encounter for screening examination for mental health and behavioral disorders, unspecified: Secondary | ICD-10-CM | POA: Diagnosis not present

## 2023-04-01 DIAGNOSIS — N2581 Secondary hyperparathyroidism of renal origin: Secondary | ICD-10-CM | POA: Diagnosis not present

## 2023-04-03 DIAGNOSIS — N186 End stage renal disease: Secondary | ICD-10-CM | POA: Diagnosis not present

## 2023-04-03 DIAGNOSIS — D509 Iron deficiency anemia, unspecified: Secondary | ICD-10-CM | POA: Diagnosis not present

## 2023-04-03 DIAGNOSIS — D631 Anemia in chronic kidney disease: Secondary | ICD-10-CM | POA: Diagnosis not present

## 2023-04-03 DIAGNOSIS — N2581 Secondary hyperparathyroidism of renal origin: Secondary | ICD-10-CM | POA: Diagnosis not present

## 2023-04-06 DIAGNOSIS — N186 End stage renal disease: Secondary | ICD-10-CM | POA: Diagnosis not present

## 2023-04-06 DIAGNOSIS — D509 Iron deficiency anemia, unspecified: Secondary | ICD-10-CM | POA: Diagnosis not present

## 2023-04-06 DIAGNOSIS — Z992 Dependence on renal dialysis: Secondary | ICD-10-CM | POA: Diagnosis not present

## 2023-04-06 DIAGNOSIS — I82409 Acute embolism and thrombosis of unspecified deep veins of unspecified lower extremity: Secondary | ICD-10-CM | POA: Diagnosis not present

## 2023-04-06 DIAGNOSIS — N2581 Secondary hyperparathyroidism of renal origin: Secondary | ICD-10-CM | POA: Diagnosis not present

## 2023-04-06 DIAGNOSIS — D631 Anemia in chronic kidney disease: Secondary | ICD-10-CM | POA: Diagnosis not present

## 2023-04-07 DIAGNOSIS — N186 End stage renal disease: Secondary | ICD-10-CM | POA: Diagnosis not present

## 2023-04-07 DIAGNOSIS — D509 Iron deficiency anemia, unspecified: Secondary | ICD-10-CM | POA: Diagnosis not present

## 2023-04-07 DIAGNOSIS — N2581 Secondary hyperparathyroidism of renal origin: Secondary | ICD-10-CM | POA: Diagnosis not present

## 2023-04-07 DIAGNOSIS — D631 Anemia in chronic kidney disease: Secondary | ICD-10-CM | POA: Diagnosis not present

## 2023-04-08 DIAGNOSIS — D631 Anemia in chronic kidney disease: Secondary | ICD-10-CM | POA: Diagnosis not present

## 2023-04-08 DIAGNOSIS — E119 Type 2 diabetes mellitus without complications: Secondary | ICD-10-CM | POA: Diagnosis not present

## 2023-04-08 DIAGNOSIS — D509 Iron deficiency anemia, unspecified: Secondary | ICD-10-CM | POA: Diagnosis not present

## 2023-04-08 DIAGNOSIS — N186 End stage renal disease: Secondary | ICD-10-CM | POA: Diagnosis not present

## 2023-04-08 DIAGNOSIS — N2581 Secondary hyperparathyroidism of renal origin: Secondary | ICD-10-CM | POA: Diagnosis not present

## 2023-04-10 DIAGNOSIS — N186 End stage renal disease: Secondary | ICD-10-CM | POA: Diagnosis not present

## 2023-04-10 DIAGNOSIS — N2581 Secondary hyperparathyroidism of renal origin: Secondary | ICD-10-CM | POA: Diagnosis not present

## 2023-04-10 DIAGNOSIS — D509 Iron deficiency anemia, unspecified: Secondary | ICD-10-CM | POA: Diagnosis not present

## 2023-04-10 DIAGNOSIS — D631 Anemia in chronic kidney disease: Secondary | ICD-10-CM | POA: Diagnosis not present

## 2023-04-13 DIAGNOSIS — D509 Iron deficiency anemia, unspecified: Secondary | ICD-10-CM | POA: Diagnosis not present

## 2023-04-13 DIAGNOSIS — I82409 Acute embolism and thrombosis of unspecified deep veins of unspecified lower extremity: Secondary | ICD-10-CM | POA: Diagnosis not present

## 2023-04-13 DIAGNOSIS — D631 Anemia in chronic kidney disease: Secondary | ICD-10-CM | POA: Diagnosis not present

## 2023-04-13 DIAGNOSIS — N2581 Secondary hyperparathyroidism of renal origin: Secondary | ICD-10-CM | POA: Diagnosis not present

## 2023-04-13 DIAGNOSIS — N186 End stage renal disease: Secondary | ICD-10-CM | POA: Diagnosis not present

## 2023-04-15 DIAGNOSIS — N2581 Secondary hyperparathyroidism of renal origin: Secondary | ICD-10-CM | POA: Diagnosis not present

## 2023-04-15 DIAGNOSIS — D509 Iron deficiency anemia, unspecified: Secondary | ICD-10-CM | POA: Diagnosis not present

## 2023-04-15 DIAGNOSIS — N186 End stage renal disease: Secondary | ICD-10-CM | POA: Diagnosis not present

## 2023-04-15 DIAGNOSIS — D631 Anemia in chronic kidney disease: Secondary | ICD-10-CM | POA: Diagnosis not present

## 2023-04-17 DIAGNOSIS — N2581 Secondary hyperparathyroidism of renal origin: Secondary | ICD-10-CM | POA: Diagnosis not present

## 2023-04-17 DIAGNOSIS — D631 Anemia in chronic kidney disease: Secondary | ICD-10-CM | POA: Diagnosis not present

## 2023-04-17 DIAGNOSIS — N186 End stage renal disease: Secondary | ICD-10-CM | POA: Diagnosis not present

## 2023-04-17 DIAGNOSIS — D509 Iron deficiency anemia, unspecified: Secondary | ICD-10-CM | POA: Diagnosis not present

## 2023-04-20 DIAGNOSIS — D509 Iron deficiency anemia, unspecified: Secondary | ICD-10-CM | POA: Diagnosis not present

## 2023-04-20 DIAGNOSIS — I82409 Acute embolism and thrombosis of unspecified deep veins of unspecified lower extremity: Secondary | ICD-10-CM | POA: Diagnosis not present

## 2023-04-20 DIAGNOSIS — N2581 Secondary hyperparathyroidism of renal origin: Secondary | ICD-10-CM | POA: Diagnosis not present

## 2023-04-20 DIAGNOSIS — N186 End stage renal disease: Secondary | ICD-10-CM | POA: Diagnosis not present

## 2023-04-20 DIAGNOSIS — D631 Anemia in chronic kidney disease: Secondary | ICD-10-CM | POA: Diagnosis not present

## 2023-04-22 DIAGNOSIS — N2581 Secondary hyperparathyroidism of renal origin: Secondary | ICD-10-CM | POA: Diagnosis not present

## 2023-04-22 DIAGNOSIS — D631 Anemia in chronic kidney disease: Secondary | ICD-10-CM | POA: Diagnosis not present

## 2023-04-22 DIAGNOSIS — N186 End stage renal disease: Secondary | ICD-10-CM | POA: Diagnosis not present

## 2023-04-22 DIAGNOSIS — D509 Iron deficiency anemia, unspecified: Secondary | ICD-10-CM | POA: Diagnosis not present

## 2023-04-24 DIAGNOSIS — D631 Anemia in chronic kidney disease: Secondary | ICD-10-CM | POA: Diagnosis not present

## 2023-04-24 DIAGNOSIS — N186 End stage renal disease: Secondary | ICD-10-CM | POA: Diagnosis not present

## 2023-04-24 DIAGNOSIS — N2581 Secondary hyperparathyroidism of renal origin: Secondary | ICD-10-CM | POA: Diagnosis not present

## 2023-04-24 DIAGNOSIS — D509 Iron deficiency anemia, unspecified: Secondary | ICD-10-CM | POA: Diagnosis not present

## 2023-04-27 DIAGNOSIS — N186 End stage renal disease: Secondary | ICD-10-CM | POA: Diagnosis not present

## 2023-04-27 DIAGNOSIS — D509 Iron deficiency anemia, unspecified: Secondary | ICD-10-CM | POA: Diagnosis not present

## 2023-04-27 DIAGNOSIS — I82409 Acute embolism and thrombosis of unspecified deep veins of unspecified lower extremity: Secondary | ICD-10-CM | POA: Diagnosis not present

## 2023-04-27 DIAGNOSIS — N2581 Secondary hyperparathyroidism of renal origin: Secondary | ICD-10-CM | POA: Diagnosis not present

## 2023-04-27 DIAGNOSIS — D631 Anemia in chronic kidney disease: Secondary | ICD-10-CM | POA: Diagnosis not present

## 2023-04-29 DIAGNOSIS — N186 End stage renal disease: Secondary | ICD-10-CM | POA: Diagnosis not present

## 2023-04-29 DIAGNOSIS — D631 Anemia in chronic kidney disease: Secondary | ICD-10-CM | POA: Diagnosis not present

## 2023-04-29 DIAGNOSIS — N2581 Secondary hyperparathyroidism of renal origin: Secondary | ICD-10-CM | POA: Diagnosis not present

## 2023-04-29 DIAGNOSIS — D509 Iron deficiency anemia, unspecified: Secondary | ICD-10-CM | POA: Diagnosis not present

## 2023-04-30 DIAGNOSIS — N186 End stage renal disease: Secondary | ICD-10-CM | POA: Diagnosis not present

## 2023-04-30 DIAGNOSIS — M329 Systemic lupus erythematosus, unspecified: Secondary | ICD-10-CM | POA: Diagnosis not present

## 2023-04-30 DIAGNOSIS — Z79899 Other long term (current) drug therapy: Secondary | ICD-10-CM | POA: Diagnosis not present

## 2023-04-30 DIAGNOSIS — D6862 Lupus anticoagulant syndrome: Secondary | ICD-10-CM | POA: Diagnosis not present

## 2023-05-01 DIAGNOSIS — N186 End stage renal disease: Secondary | ICD-10-CM | POA: Diagnosis not present

## 2023-05-01 DIAGNOSIS — D509 Iron deficiency anemia, unspecified: Secondary | ICD-10-CM | POA: Diagnosis not present

## 2023-05-01 DIAGNOSIS — N2581 Secondary hyperparathyroidism of renal origin: Secondary | ICD-10-CM | POA: Diagnosis not present

## 2023-05-01 DIAGNOSIS — D631 Anemia in chronic kidney disease: Secondary | ICD-10-CM | POA: Diagnosis not present

## 2023-05-04 DIAGNOSIS — N186 End stage renal disease: Secondary | ICD-10-CM | POA: Diagnosis not present

## 2023-05-04 DIAGNOSIS — N2581 Secondary hyperparathyroidism of renal origin: Secondary | ICD-10-CM | POA: Diagnosis not present

## 2023-05-04 DIAGNOSIS — D509 Iron deficiency anemia, unspecified: Secondary | ICD-10-CM | POA: Diagnosis not present

## 2023-05-04 DIAGNOSIS — I82409 Acute embolism and thrombosis of unspecified deep veins of unspecified lower extremity: Secondary | ICD-10-CM | POA: Diagnosis not present

## 2023-05-04 DIAGNOSIS — D631 Anemia in chronic kidney disease: Secondary | ICD-10-CM | POA: Diagnosis not present

## 2023-05-06 DIAGNOSIS — D631 Anemia in chronic kidney disease: Secondary | ICD-10-CM | POA: Diagnosis not present

## 2023-05-06 DIAGNOSIS — D509 Iron deficiency anemia, unspecified: Secondary | ICD-10-CM | POA: Diagnosis not present

## 2023-05-06 DIAGNOSIS — N2581 Secondary hyperparathyroidism of renal origin: Secondary | ICD-10-CM | POA: Diagnosis not present

## 2023-05-06 DIAGNOSIS — N186 End stage renal disease: Secondary | ICD-10-CM | POA: Diagnosis not present

## 2023-05-06 DIAGNOSIS — Z992 Dependence on renal dialysis: Secondary | ICD-10-CM | POA: Diagnosis not present

## 2023-05-08 DIAGNOSIS — D509 Iron deficiency anemia, unspecified: Secondary | ICD-10-CM | POA: Diagnosis not present

## 2023-05-08 DIAGNOSIS — D631 Anemia in chronic kidney disease: Secondary | ICD-10-CM | POA: Diagnosis not present

## 2023-05-08 DIAGNOSIS — N2581 Secondary hyperparathyroidism of renal origin: Secondary | ICD-10-CM | POA: Diagnosis not present

## 2023-05-08 DIAGNOSIS — N186 End stage renal disease: Secondary | ICD-10-CM | POA: Diagnosis not present

## 2023-05-11 DIAGNOSIS — I82409 Acute embolism and thrombosis of unspecified deep veins of unspecified lower extremity: Secondary | ICD-10-CM | POA: Diagnosis not present

## 2023-05-11 DIAGNOSIS — D509 Iron deficiency anemia, unspecified: Secondary | ICD-10-CM | POA: Diagnosis not present

## 2023-05-11 DIAGNOSIS — N2581 Secondary hyperparathyroidism of renal origin: Secondary | ICD-10-CM | POA: Diagnosis not present

## 2023-05-11 DIAGNOSIS — N186 End stage renal disease: Secondary | ICD-10-CM | POA: Diagnosis not present

## 2023-05-11 DIAGNOSIS — D631 Anemia in chronic kidney disease: Secondary | ICD-10-CM | POA: Diagnosis not present

## 2023-05-13 DIAGNOSIS — D509 Iron deficiency anemia, unspecified: Secondary | ICD-10-CM | POA: Diagnosis not present

## 2023-05-13 DIAGNOSIS — N186 End stage renal disease: Secondary | ICD-10-CM | POA: Diagnosis not present

## 2023-05-13 DIAGNOSIS — D631 Anemia in chronic kidney disease: Secondary | ICD-10-CM | POA: Diagnosis not present

## 2023-05-13 DIAGNOSIS — N2581 Secondary hyperparathyroidism of renal origin: Secondary | ICD-10-CM | POA: Diagnosis not present

## 2023-05-13 DIAGNOSIS — E119 Type 2 diabetes mellitus without complications: Secondary | ICD-10-CM | POA: Diagnosis not present

## 2023-05-15 DIAGNOSIS — N2581 Secondary hyperparathyroidism of renal origin: Secondary | ICD-10-CM | POA: Diagnosis not present

## 2023-05-15 DIAGNOSIS — D631 Anemia in chronic kidney disease: Secondary | ICD-10-CM | POA: Diagnosis not present

## 2023-05-15 DIAGNOSIS — N186 End stage renal disease: Secondary | ICD-10-CM | POA: Diagnosis not present

## 2023-05-15 DIAGNOSIS — D509 Iron deficiency anemia, unspecified: Secondary | ICD-10-CM | POA: Diagnosis not present

## 2023-05-18 DIAGNOSIS — I82409 Acute embolism and thrombosis of unspecified deep veins of unspecified lower extremity: Secondary | ICD-10-CM | POA: Diagnosis not present

## 2023-05-18 DIAGNOSIS — D509 Iron deficiency anemia, unspecified: Secondary | ICD-10-CM | POA: Diagnosis not present

## 2023-05-18 DIAGNOSIS — N2581 Secondary hyperparathyroidism of renal origin: Secondary | ICD-10-CM | POA: Diagnosis not present

## 2023-05-18 DIAGNOSIS — D631 Anemia in chronic kidney disease: Secondary | ICD-10-CM | POA: Diagnosis not present

## 2023-05-18 DIAGNOSIS — N186 End stage renal disease: Secondary | ICD-10-CM | POA: Diagnosis not present

## 2023-05-20 DIAGNOSIS — N2581 Secondary hyperparathyroidism of renal origin: Secondary | ICD-10-CM | POA: Diagnosis not present

## 2023-05-20 DIAGNOSIS — D509 Iron deficiency anemia, unspecified: Secondary | ICD-10-CM | POA: Diagnosis not present

## 2023-05-20 DIAGNOSIS — D631 Anemia in chronic kidney disease: Secondary | ICD-10-CM | POA: Diagnosis not present

## 2023-05-20 DIAGNOSIS — N186 End stage renal disease: Secondary | ICD-10-CM | POA: Diagnosis not present

## 2023-05-22 DIAGNOSIS — N186 End stage renal disease: Secondary | ICD-10-CM | POA: Diagnosis not present

## 2023-05-22 DIAGNOSIS — D509 Iron deficiency anemia, unspecified: Secondary | ICD-10-CM | POA: Diagnosis not present

## 2023-05-22 DIAGNOSIS — N2581 Secondary hyperparathyroidism of renal origin: Secondary | ICD-10-CM | POA: Diagnosis not present

## 2023-05-22 DIAGNOSIS — D631 Anemia in chronic kidney disease: Secondary | ICD-10-CM | POA: Diagnosis not present

## 2023-05-25 DIAGNOSIS — I82409 Acute embolism and thrombosis of unspecified deep veins of unspecified lower extremity: Secondary | ICD-10-CM | POA: Diagnosis not present

## 2023-05-25 DIAGNOSIS — D631 Anemia in chronic kidney disease: Secondary | ICD-10-CM | POA: Diagnosis not present

## 2023-05-25 DIAGNOSIS — N2581 Secondary hyperparathyroidism of renal origin: Secondary | ICD-10-CM | POA: Diagnosis not present

## 2023-05-25 DIAGNOSIS — D509 Iron deficiency anemia, unspecified: Secondary | ICD-10-CM | POA: Diagnosis not present

## 2023-05-25 DIAGNOSIS — N186 End stage renal disease: Secondary | ICD-10-CM | POA: Diagnosis not present

## 2023-05-27 DIAGNOSIS — N2581 Secondary hyperparathyroidism of renal origin: Secondary | ICD-10-CM | POA: Diagnosis not present

## 2023-05-27 DIAGNOSIS — D631 Anemia in chronic kidney disease: Secondary | ICD-10-CM | POA: Diagnosis not present

## 2023-05-27 DIAGNOSIS — Z7901 Long term (current) use of anticoagulants: Secondary | ICD-10-CM | POA: Diagnosis not present

## 2023-05-27 DIAGNOSIS — N186 End stage renal disease: Secondary | ICD-10-CM | POA: Diagnosis not present

## 2023-05-27 DIAGNOSIS — D509 Iron deficiency anemia, unspecified: Secondary | ICD-10-CM | POA: Diagnosis not present

## 2023-05-29 DIAGNOSIS — N2581 Secondary hyperparathyroidism of renal origin: Secondary | ICD-10-CM | POA: Diagnosis not present

## 2023-05-29 DIAGNOSIS — N186 End stage renal disease: Secondary | ICD-10-CM | POA: Diagnosis not present

## 2023-05-29 DIAGNOSIS — D631 Anemia in chronic kidney disease: Secondary | ICD-10-CM | POA: Diagnosis not present

## 2023-05-29 DIAGNOSIS — D509 Iron deficiency anemia, unspecified: Secondary | ICD-10-CM | POA: Diagnosis not present

## 2023-06-01 DIAGNOSIS — N186 End stage renal disease: Secondary | ICD-10-CM | POA: Diagnosis not present

## 2023-06-01 DIAGNOSIS — D631 Anemia in chronic kidney disease: Secondary | ICD-10-CM | POA: Diagnosis not present

## 2023-06-01 DIAGNOSIS — I82409 Acute embolism and thrombosis of unspecified deep veins of unspecified lower extremity: Secondary | ICD-10-CM | POA: Diagnosis not present

## 2023-06-01 DIAGNOSIS — N2581 Secondary hyperparathyroidism of renal origin: Secondary | ICD-10-CM | POA: Diagnosis not present

## 2023-06-01 DIAGNOSIS — D509 Iron deficiency anemia, unspecified: Secondary | ICD-10-CM | POA: Diagnosis not present

## 2023-06-03 DIAGNOSIS — D631 Anemia in chronic kidney disease: Secondary | ICD-10-CM | POA: Diagnosis not present

## 2023-06-03 DIAGNOSIS — D509 Iron deficiency anemia, unspecified: Secondary | ICD-10-CM | POA: Diagnosis not present

## 2023-06-03 DIAGNOSIS — N2581 Secondary hyperparathyroidism of renal origin: Secondary | ICD-10-CM | POA: Diagnosis not present

## 2023-06-03 DIAGNOSIS — N186 End stage renal disease: Secondary | ICD-10-CM | POA: Diagnosis not present

## 2023-06-05 DIAGNOSIS — N2581 Secondary hyperparathyroidism of renal origin: Secondary | ICD-10-CM | POA: Diagnosis not present

## 2023-06-05 DIAGNOSIS — D631 Anemia in chronic kidney disease: Secondary | ICD-10-CM | POA: Diagnosis not present

## 2023-06-05 DIAGNOSIS — D509 Iron deficiency anemia, unspecified: Secondary | ICD-10-CM | POA: Diagnosis not present

## 2023-06-05 DIAGNOSIS — N186 End stage renal disease: Secondary | ICD-10-CM | POA: Diagnosis not present

## 2023-06-06 DIAGNOSIS — Z992 Dependence on renal dialysis: Secondary | ICD-10-CM | POA: Diagnosis not present

## 2023-06-06 DIAGNOSIS — N186 End stage renal disease: Secondary | ICD-10-CM | POA: Diagnosis not present

## 2023-06-08 DIAGNOSIS — I82409 Acute embolism and thrombosis of unspecified deep veins of unspecified lower extremity: Secondary | ICD-10-CM | POA: Diagnosis not present

## 2023-06-08 DIAGNOSIS — N2581 Secondary hyperparathyroidism of renal origin: Secondary | ICD-10-CM | POA: Diagnosis not present

## 2023-06-08 DIAGNOSIS — D631 Anemia in chronic kidney disease: Secondary | ICD-10-CM | POA: Diagnosis not present

## 2023-06-08 DIAGNOSIS — D509 Iron deficiency anemia, unspecified: Secondary | ICD-10-CM | POA: Diagnosis not present

## 2023-06-08 DIAGNOSIS — N186 End stage renal disease: Secondary | ICD-10-CM | POA: Diagnosis not present

## 2023-06-10 DIAGNOSIS — N2581 Secondary hyperparathyroidism of renal origin: Secondary | ICD-10-CM | POA: Diagnosis not present

## 2023-06-10 DIAGNOSIS — N186 End stage renal disease: Secondary | ICD-10-CM | POA: Diagnosis not present

## 2023-06-10 DIAGNOSIS — E119 Type 2 diabetes mellitus without complications: Secondary | ICD-10-CM | POA: Diagnosis not present

## 2023-06-10 DIAGNOSIS — D631 Anemia in chronic kidney disease: Secondary | ICD-10-CM | POA: Diagnosis not present

## 2023-06-10 DIAGNOSIS — D509 Iron deficiency anemia, unspecified: Secondary | ICD-10-CM | POA: Diagnosis not present

## 2023-06-12 DIAGNOSIS — N186 End stage renal disease: Secondary | ICD-10-CM | POA: Diagnosis not present

## 2023-06-12 DIAGNOSIS — N2581 Secondary hyperparathyroidism of renal origin: Secondary | ICD-10-CM | POA: Diagnosis not present

## 2023-06-12 DIAGNOSIS — D631 Anemia in chronic kidney disease: Secondary | ICD-10-CM | POA: Diagnosis not present

## 2023-06-12 DIAGNOSIS — D509 Iron deficiency anemia, unspecified: Secondary | ICD-10-CM | POA: Diagnosis not present

## 2023-06-15 DIAGNOSIS — N186 End stage renal disease: Secondary | ICD-10-CM | POA: Diagnosis not present

## 2023-06-15 DIAGNOSIS — D631 Anemia in chronic kidney disease: Secondary | ICD-10-CM | POA: Diagnosis not present

## 2023-06-15 DIAGNOSIS — D509 Iron deficiency anemia, unspecified: Secondary | ICD-10-CM | POA: Diagnosis not present

## 2023-06-15 DIAGNOSIS — N2581 Secondary hyperparathyroidism of renal origin: Secondary | ICD-10-CM | POA: Diagnosis not present

## 2023-06-15 DIAGNOSIS — I82409 Acute embolism and thrombosis of unspecified deep veins of unspecified lower extremity: Secondary | ICD-10-CM | POA: Diagnosis not present

## 2023-06-17 DIAGNOSIS — N186 End stage renal disease: Secondary | ICD-10-CM | POA: Diagnosis not present

## 2023-06-17 DIAGNOSIS — N2581 Secondary hyperparathyroidism of renal origin: Secondary | ICD-10-CM | POA: Diagnosis not present

## 2023-06-17 DIAGNOSIS — D631 Anemia in chronic kidney disease: Secondary | ICD-10-CM | POA: Diagnosis not present

## 2023-06-17 DIAGNOSIS — D509 Iron deficiency anemia, unspecified: Secondary | ICD-10-CM | POA: Diagnosis not present

## 2023-06-19 DIAGNOSIS — N2581 Secondary hyperparathyroidism of renal origin: Secondary | ICD-10-CM | POA: Diagnosis not present

## 2023-06-19 DIAGNOSIS — D631 Anemia in chronic kidney disease: Secondary | ICD-10-CM | POA: Diagnosis not present

## 2023-06-19 DIAGNOSIS — N186 End stage renal disease: Secondary | ICD-10-CM | POA: Diagnosis not present

## 2023-06-19 DIAGNOSIS — D509 Iron deficiency anemia, unspecified: Secondary | ICD-10-CM | POA: Diagnosis not present

## 2023-06-22 DIAGNOSIS — N2581 Secondary hyperparathyroidism of renal origin: Secondary | ICD-10-CM | POA: Diagnosis not present

## 2023-06-22 DIAGNOSIS — N186 End stage renal disease: Secondary | ICD-10-CM | POA: Diagnosis not present

## 2023-06-22 DIAGNOSIS — I82409 Acute embolism and thrombosis of unspecified deep veins of unspecified lower extremity: Secondary | ICD-10-CM | POA: Diagnosis not present

## 2023-06-22 DIAGNOSIS — D509 Iron deficiency anemia, unspecified: Secondary | ICD-10-CM | POA: Diagnosis not present

## 2023-06-22 DIAGNOSIS — D631 Anemia in chronic kidney disease: Secondary | ICD-10-CM | POA: Diagnosis not present

## 2023-06-24 DIAGNOSIS — N2581 Secondary hyperparathyroidism of renal origin: Secondary | ICD-10-CM | POA: Diagnosis not present

## 2023-06-24 DIAGNOSIS — D509 Iron deficiency anemia, unspecified: Secondary | ICD-10-CM | POA: Diagnosis not present

## 2023-06-24 DIAGNOSIS — D631 Anemia in chronic kidney disease: Secondary | ICD-10-CM | POA: Diagnosis not present

## 2023-06-24 DIAGNOSIS — N186 End stage renal disease: Secondary | ICD-10-CM | POA: Diagnosis not present

## 2023-06-26 DIAGNOSIS — D509 Iron deficiency anemia, unspecified: Secondary | ICD-10-CM | POA: Diagnosis not present

## 2023-06-26 DIAGNOSIS — D631 Anemia in chronic kidney disease: Secondary | ICD-10-CM | POA: Diagnosis not present

## 2023-06-26 DIAGNOSIS — N186 End stage renal disease: Secondary | ICD-10-CM | POA: Diagnosis not present

## 2023-06-26 DIAGNOSIS — N2581 Secondary hyperparathyroidism of renal origin: Secondary | ICD-10-CM | POA: Diagnosis not present

## 2023-06-29 DIAGNOSIS — I82409 Acute embolism and thrombosis of unspecified deep veins of unspecified lower extremity: Secondary | ICD-10-CM | POA: Diagnosis not present

## 2023-06-29 DIAGNOSIS — N2581 Secondary hyperparathyroidism of renal origin: Secondary | ICD-10-CM | POA: Diagnosis not present

## 2023-06-29 DIAGNOSIS — D509 Iron deficiency anemia, unspecified: Secondary | ICD-10-CM | POA: Diagnosis not present

## 2023-06-29 DIAGNOSIS — D631 Anemia in chronic kidney disease: Secondary | ICD-10-CM | POA: Diagnosis not present

## 2023-06-29 DIAGNOSIS — N186 End stage renal disease: Secondary | ICD-10-CM | POA: Diagnosis not present

## 2023-07-01 DIAGNOSIS — D509 Iron deficiency anemia, unspecified: Secondary | ICD-10-CM | POA: Diagnosis not present

## 2023-07-01 DIAGNOSIS — N2581 Secondary hyperparathyroidism of renal origin: Secondary | ICD-10-CM | POA: Diagnosis not present

## 2023-07-01 DIAGNOSIS — D631 Anemia in chronic kidney disease: Secondary | ICD-10-CM | POA: Diagnosis not present

## 2023-07-01 DIAGNOSIS — N186 End stage renal disease: Secondary | ICD-10-CM | POA: Diagnosis not present

## 2023-07-03 DIAGNOSIS — D509 Iron deficiency anemia, unspecified: Secondary | ICD-10-CM | POA: Diagnosis not present

## 2023-07-03 DIAGNOSIS — D631 Anemia in chronic kidney disease: Secondary | ICD-10-CM | POA: Diagnosis not present

## 2023-07-03 DIAGNOSIS — N186 End stage renal disease: Secondary | ICD-10-CM | POA: Diagnosis not present

## 2023-07-03 DIAGNOSIS — N2581 Secondary hyperparathyroidism of renal origin: Secondary | ICD-10-CM | POA: Diagnosis not present

## 2023-07-06 DIAGNOSIS — N186 End stage renal disease: Secondary | ICD-10-CM | POA: Diagnosis not present

## 2023-07-06 DIAGNOSIS — D509 Iron deficiency anemia, unspecified: Secondary | ICD-10-CM | POA: Diagnosis not present

## 2023-07-06 DIAGNOSIS — Z992 Dependence on renal dialysis: Secondary | ICD-10-CM | POA: Diagnosis not present

## 2023-07-06 DIAGNOSIS — I82409 Acute embolism and thrombosis of unspecified deep veins of unspecified lower extremity: Secondary | ICD-10-CM | POA: Diagnosis not present

## 2023-07-06 DIAGNOSIS — N2581 Secondary hyperparathyroidism of renal origin: Secondary | ICD-10-CM | POA: Diagnosis not present

## 2023-07-06 DIAGNOSIS — D631 Anemia in chronic kidney disease: Secondary | ICD-10-CM | POA: Diagnosis not present

## 2023-07-08 DIAGNOSIS — N186 End stage renal disease: Secondary | ICD-10-CM | POA: Diagnosis not present

## 2023-07-08 DIAGNOSIS — D631 Anemia in chronic kidney disease: Secondary | ICD-10-CM | POA: Diagnosis not present

## 2023-07-08 DIAGNOSIS — N2581 Secondary hyperparathyroidism of renal origin: Secondary | ICD-10-CM | POA: Diagnosis not present

## 2023-07-08 DIAGNOSIS — D509 Iron deficiency anemia, unspecified: Secondary | ICD-10-CM | POA: Diagnosis not present

## 2023-07-08 DIAGNOSIS — E119 Type 2 diabetes mellitus without complications: Secondary | ICD-10-CM | POA: Diagnosis not present

## 2023-07-08 DIAGNOSIS — E213 Hyperparathyroidism, unspecified: Secondary | ICD-10-CM | POA: Diagnosis not present

## 2023-07-10 DIAGNOSIS — D631 Anemia in chronic kidney disease: Secondary | ICD-10-CM | POA: Diagnosis not present

## 2023-07-10 DIAGNOSIS — D509 Iron deficiency anemia, unspecified: Secondary | ICD-10-CM | POA: Diagnosis not present

## 2023-07-10 DIAGNOSIS — E213 Hyperparathyroidism, unspecified: Secondary | ICD-10-CM | POA: Diagnosis not present

## 2023-07-10 DIAGNOSIS — N2581 Secondary hyperparathyroidism of renal origin: Secondary | ICD-10-CM | POA: Diagnosis not present

## 2023-07-10 DIAGNOSIS — N186 End stage renal disease: Secondary | ICD-10-CM | POA: Diagnosis not present

## 2023-07-13 DIAGNOSIS — N2581 Secondary hyperparathyroidism of renal origin: Secondary | ICD-10-CM | POA: Diagnosis not present

## 2023-07-13 DIAGNOSIS — D631 Anemia in chronic kidney disease: Secondary | ICD-10-CM | POA: Diagnosis not present

## 2023-07-13 DIAGNOSIS — I82409 Acute embolism and thrombosis of unspecified deep veins of unspecified lower extremity: Secondary | ICD-10-CM | POA: Diagnosis not present

## 2023-07-13 DIAGNOSIS — D509 Iron deficiency anemia, unspecified: Secondary | ICD-10-CM | POA: Diagnosis not present

## 2023-07-13 DIAGNOSIS — E213 Hyperparathyroidism, unspecified: Secondary | ICD-10-CM | POA: Diagnosis not present

## 2023-07-13 DIAGNOSIS — N186 End stage renal disease: Secondary | ICD-10-CM | POA: Diagnosis not present

## 2023-07-15 DIAGNOSIS — E213 Hyperparathyroidism, unspecified: Secondary | ICD-10-CM | POA: Diagnosis not present

## 2023-07-15 DIAGNOSIS — N2581 Secondary hyperparathyroidism of renal origin: Secondary | ICD-10-CM | POA: Diagnosis not present

## 2023-07-15 DIAGNOSIS — N186 End stage renal disease: Secondary | ICD-10-CM | POA: Diagnosis not present

## 2023-07-15 DIAGNOSIS — D631 Anemia in chronic kidney disease: Secondary | ICD-10-CM | POA: Diagnosis not present

## 2023-07-15 DIAGNOSIS — D509 Iron deficiency anemia, unspecified: Secondary | ICD-10-CM | POA: Diagnosis not present

## 2023-07-17 DIAGNOSIS — N2581 Secondary hyperparathyroidism of renal origin: Secondary | ICD-10-CM | POA: Diagnosis not present

## 2023-07-17 DIAGNOSIS — D509 Iron deficiency anemia, unspecified: Secondary | ICD-10-CM | POA: Diagnosis not present

## 2023-07-17 DIAGNOSIS — D631 Anemia in chronic kidney disease: Secondary | ICD-10-CM | POA: Diagnosis not present

## 2023-07-17 DIAGNOSIS — N186 End stage renal disease: Secondary | ICD-10-CM | POA: Diagnosis not present

## 2023-07-17 DIAGNOSIS — E213 Hyperparathyroidism, unspecified: Secondary | ICD-10-CM | POA: Diagnosis not present

## 2023-07-20 DIAGNOSIS — D509 Iron deficiency anemia, unspecified: Secondary | ICD-10-CM | POA: Diagnosis not present

## 2023-07-20 DIAGNOSIS — I82409 Acute embolism and thrombosis of unspecified deep veins of unspecified lower extremity: Secondary | ICD-10-CM | POA: Diagnosis not present

## 2023-07-20 DIAGNOSIS — N186 End stage renal disease: Secondary | ICD-10-CM | POA: Diagnosis not present

## 2023-07-20 DIAGNOSIS — E213 Hyperparathyroidism, unspecified: Secondary | ICD-10-CM | POA: Diagnosis not present

## 2023-07-20 DIAGNOSIS — D631 Anemia in chronic kidney disease: Secondary | ICD-10-CM | POA: Diagnosis not present

## 2023-07-20 DIAGNOSIS — N2581 Secondary hyperparathyroidism of renal origin: Secondary | ICD-10-CM | POA: Diagnosis not present

## 2023-07-22 DIAGNOSIS — D631 Anemia in chronic kidney disease: Secondary | ICD-10-CM | POA: Diagnosis not present

## 2023-07-22 DIAGNOSIS — D509 Iron deficiency anemia, unspecified: Secondary | ICD-10-CM | POA: Diagnosis not present

## 2023-07-22 DIAGNOSIS — E213 Hyperparathyroidism, unspecified: Secondary | ICD-10-CM | POA: Diagnosis not present

## 2023-07-22 DIAGNOSIS — N2581 Secondary hyperparathyroidism of renal origin: Secondary | ICD-10-CM | POA: Diagnosis not present

## 2023-07-22 DIAGNOSIS — N186 End stage renal disease: Secondary | ICD-10-CM | POA: Diagnosis not present

## 2023-07-24 DIAGNOSIS — D631 Anemia in chronic kidney disease: Secondary | ICD-10-CM | POA: Diagnosis not present

## 2023-07-24 DIAGNOSIS — N186 End stage renal disease: Secondary | ICD-10-CM | POA: Diagnosis not present

## 2023-07-24 DIAGNOSIS — N2581 Secondary hyperparathyroidism of renal origin: Secondary | ICD-10-CM | POA: Diagnosis not present

## 2023-07-24 DIAGNOSIS — E213 Hyperparathyroidism, unspecified: Secondary | ICD-10-CM | POA: Diagnosis not present

## 2023-07-24 DIAGNOSIS — D509 Iron deficiency anemia, unspecified: Secondary | ICD-10-CM | POA: Diagnosis not present

## 2023-07-27 DIAGNOSIS — E213 Hyperparathyroidism, unspecified: Secondary | ICD-10-CM | POA: Diagnosis not present

## 2023-07-27 DIAGNOSIS — N2581 Secondary hyperparathyroidism of renal origin: Secondary | ICD-10-CM | POA: Diagnosis not present

## 2023-07-27 DIAGNOSIS — N186 End stage renal disease: Secondary | ICD-10-CM | POA: Diagnosis not present

## 2023-07-27 DIAGNOSIS — D509 Iron deficiency anemia, unspecified: Secondary | ICD-10-CM | POA: Diagnosis not present

## 2023-07-27 DIAGNOSIS — D631 Anemia in chronic kidney disease: Secondary | ICD-10-CM | POA: Diagnosis not present

## 2023-07-27 DIAGNOSIS — I82409 Acute embolism and thrombosis of unspecified deep veins of unspecified lower extremity: Secondary | ICD-10-CM | POA: Diagnosis not present

## 2023-07-29 DIAGNOSIS — N2581 Secondary hyperparathyroidism of renal origin: Secondary | ICD-10-CM | POA: Diagnosis not present

## 2023-07-29 DIAGNOSIS — N186 End stage renal disease: Secondary | ICD-10-CM | POA: Diagnosis not present

## 2023-07-29 DIAGNOSIS — D509 Iron deficiency anemia, unspecified: Secondary | ICD-10-CM | POA: Diagnosis not present

## 2023-07-29 DIAGNOSIS — E213 Hyperparathyroidism, unspecified: Secondary | ICD-10-CM | POA: Diagnosis not present

## 2023-07-29 DIAGNOSIS — D631 Anemia in chronic kidney disease: Secondary | ICD-10-CM | POA: Diagnosis not present

## 2023-07-31 DIAGNOSIS — D631 Anemia in chronic kidney disease: Secondary | ICD-10-CM | POA: Diagnosis not present

## 2023-07-31 DIAGNOSIS — N2581 Secondary hyperparathyroidism of renal origin: Secondary | ICD-10-CM | POA: Diagnosis not present

## 2023-07-31 DIAGNOSIS — D509 Iron deficiency anemia, unspecified: Secondary | ICD-10-CM | POA: Diagnosis not present

## 2023-07-31 DIAGNOSIS — E213 Hyperparathyroidism, unspecified: Secondary | ICD-10-CM | POA: Diagnosis not present

## 2023-07-31 DIAGNOSIS — N186 End stage renal disease: Secondary | ICD-10-CM | POA: Diagnosis not present

## 2023-08-03 DIAGNOSIS — D509 Iron deficiency anemia, unspecified: Secondary | ICD-10-CM | POA: Diagnosis not present

## 2023-08-03 DIAGNOSIS — D631 Anemia in chronic kidney disease: Secondary | ICD-10-CM | POA: Diagnosis not present

## 2023-08-03 DIAGNOSIS — I82409 Acute embolism and thrombosis of unspecified deep veins of unspecified lower extremity: Secondary | ICD-10-CM | POA: Diagnosis not present

## 2023-08-03 DIAGNOSIS — N186 End stage renal disease: Secondary | ICD-10-CM | POA: Diagnosis not present

## 2023-08-03 DIAGNOSIS — N2581 Secondary hyperparathyroidism of renal origin: Secondary | ICD-10-CM | POA: Diagnosis not present

## 2023-08-03 DIAGNOSIS — E213 Hyperparathyroidism, unspecified: Secondary | ICD-10-CM | POA: Diagnosis not present

## 2023-08-05 DIAGNOSIS — E213 Hyperparathyroidism, unspecified: Secondary | ICD-10-CM | POA: Diagnosis not present

## 2023-08-05 DIAGNOSIS — N186 End stage renal disease: Secondary | ICD-10-CM | POA: Diagnosis not present

## 2023-08-05 DIAGNOSIS — D631 Anemia in chronic kidney disease: Secondary | ICD-10-CM | POA: Diagnosis not present

## 2023-08-05 DIAGNOSIS — N2581 Secondary hyperparathyroidism of renal origin: Secondary | ICD-10-CM | POA: Diagnosis not present

## 2023-08-05 DIAGNOSIS — D509 Iron deficiency anemia, unspecified: Secondary | ICD-10-CM | POA: Diagnosis not present

## 2023-08-06 DIAGNOSIS — Z992 Dependence on renal dialysis: Secondary | ICD-10-CM | POA: Diagnosis not present

## 2023-08-06 DIAGNOSIS — N186 End stage renal disease: Secondary | ICD-10-CM | POA: Diagnosis not present

## 2023-08-07 DIAGNOSIS — D509 Iron deficiency anemia, unspecified: Secondary | ICD-10-CM | POA: Diagnosis not present

## 2023-08-07 DIAGNOSIS — N186 End stage renal disease: Secondary | ICD-10-CM | POA: Diagnosis not present

## 2023-08-07 DIAGNOSIS — N2581 Secondary hyperparathyroidism of renal origin: Secondary | ICD-10-CM | POA: Diagnosis not present

## 2023-08-07 DIAGNOSIS — D631 Anemia in chronic kidney disease: Secondary | ICD-10-CM | POA: Diagnosis not present

## 2023-08-10 DIAGNOSIS — D509 Iron deficiency anemia, unspecified: Secondary | ICD-10-CM | POA: Diagnosis not present

## 2023-08-10 DIAGNOSIS — N2581 Secondary hyperparathyroidism of renal origin: Secondary | ICD-10-CM | POA: Diagnosis not present

## 2023-08-10 DIAGNOSIS — D631 Anemia in chronic kidney disease: Secondary | ICD-10-CM | POA: Diagnosis not present

## 2023-08-10 DIAGNOSIS — I82409 Acute embolism and thrombosis of unspecified deep veins of unspecified lower extremity: Secondary | ICD-10-CM | POA: Diagnosis not present

## 2023-08-10 DIAGNOSIS — N186 End stage renal disease: Secondary | ICD-10-CM | POA: Diagnosis not present

## 2023-08-12 DIAGNOSIS — N186 End stage renal disease: Secondary | ICD-10-CM | POA: Diagnosis not present

## 2023-08-12 DIAGNOSIS — N2581 Secondary hyperparathyroidism of renal origin: Secondary | ICD-10-CM | POA: Diagnosis not present

## 2023-08-12 DIAGNOSIS — D631 Anemia in chronic kidney disease: Secondary | ICD-10-CM | POA: Diagnosis not present

## 2023-08-12 DIAGNOSIS — E119 Type 2 diabetes mellitus without complications: Secondary | ICD-10-CM | POA: Diagnosis not present

## 2023-08-12 DIAGNOSIS — D509 Iron deficiency anemia, unspecified: Secondary | ICD-10-CM | POA: Diagnosis not present

## 2023-08-14 DIAGNOSIS — N186 End stage renal disease: Secondary | ICD-10-CM | POA: Diagnosis not present

## 2023-08-14 DIAGNOSIS — D509 Iron deficiency anemia, unspecified: Secondary | ICD-10-CM | POA: Diagnosis not present

## 2023-08-14 DIAGNOSIS — D631 Anemia in chronic kidney disease: Secondary | ICD-10-CM | POA: Diagnosis not present

## 2023-08-14 DIAGNOSIS — N2581 Secondary hyperparathyroidism of renal origin: Secondary | ICD-10-CM | POA: Diagnosis not present

## 2023-08-17 DIAGNOSIS — D631 Anemia in chronic kidney disease: Secondary | ICD-10-CM | POA: Diagnosis not present

## 2023-08-17 DIAGNOSIS — N186 End stage renal disease: Secondary | ICD-10-CM | POA: Diagnosis not present

## 2023-08-17 DIAGNOSIS — N2581 Secondary hyperparathyroidism of renal origin: Secondary | ICD-10-CM | POA: Diagnosis not present

## 2023-08-17 DIAGNOSIS — I82409 Acute embolism and thrombosis of unspecified deep veins of unspecified lower extremity: Secondary | ICD-10-CM | POA: Diagnosis not present

## 2023-08-17 DIAGNOSIS — D509 Iron deficiency anemia, unspecified: Secondary | ICD-10-CM | POA: Diagnosis not present

## 2023-08-19 DIAGNOSIS — D631 Anemia in chronic kidney disease: Secondary | ICD-10-CM | POA: Diagnosis not present

## 2023-08-19 DIAGNOSIS — D509 Iron deficiency anemia, unspecified: Secondary | ICD-10-CM | POA: Diagnosis not present

## 2023-08-19 DIAGNOSIS — N186 End stage renal disease: Secondary | ICD-10-CM | POA: Diagnosis not present

## 2023-08-19 DIAGNOSIS — N2581 Secondary hyperparathyroidism of renal origin: Secondary | ICD-10-CM | POA: Diagnosis not present

## 2023-08-20 DIAGNOSIS — Z79899 Other long term (current) drug therapy: Secondary | ICD-10-CM | POA: Diagnosis not present

## 2023-08-20 DIAGNOSIS — E119 Type 2 diabetes mellitus without complications: Secondary | ICD-10-CM | POA: Diagnosis not present

## 2023-08-20 DIAGNOSIS — H401131 Primary open-angle glaucoma, bilateral, mild stage: Secondary | ICD-10-CM | POA: Diagnosis not present

## 2023-08-20 DIAGNOSIS — Z961 Presence of intraocular lens: Secondary | ICD-10-CM | POA: Diagnosis not present

## 2023-08-20 DIAGNOSIS — H43813 Vitreous degeneration, bilateral: Secondary | ICD-10-CM | POA: Diagnosis not present

## 2023-08-20 DIAGNOSIS — H526 Other disorders of refraction: Secondary | ICD-10-CM | POA: Diagnosis not present

## 2023-08-21 DIAGNOSIS — D631 Anemia in chronic kidney disease: Secondary | ICD-10-CM | POA: Diagnosis not present

## 2023-08-21 DIAGNOSIS — N2581 Secondary hyperparathyroidism of renal origin: Secondary | ICD-10-CM | POA: Diagnosis not present

## 2023-08-21 DIAGNOSIS — N186 End stage renal disease: Secondary | ICD-10-CM | POA: Diagnosis not present

## 2023-08-21 DIAGNOSIS — D509 Iron deficiency anemia, unspecified: Secondary | ICD-10-CM | POA: Diagnosis not present

## 2023-08-24 DIAGNOSIS — D509 Iron deficiency anemia, unspecified: Secondary | ICD-10-CM | POA: Diagnosis not present

## 2023-08-24 DIAGNOSIS — N2581 Secondary hyperparathyroidism of renal origin: Secondary | ICD-10-CM | POA: Diagnosis not present

## 2023-08-24 DIAGNOSIS — D631 Anemia in chronic kidney disease: Secondary | ICD-10-CM | POA: Diagnosis not present

## 2023-08-24 DIAGNOSIS — I82409 Acute embolism and thrombosis of unspecified deep veins of unspecified lower extremity: Secondary | ICD-10-CM | POA: Diagnosis not present

## 2023-08-24 DIAGNOSIS — N186 End stage renal disease: Secondary | ICD-10-CM | POA: Diagnosis not present

## 2023-08-26 DIAGNOSIS — D509 Iron deficiency anemia, unspecified: Secondary | ICD-10-CM | POA: Diagnosis not present

## 2023-08-26 DIAGNOSIS — D631 Anemia in chronic kidney disease: Secondary | ICD-10-CM | POA: Diagnosis not present

## 2023-08-26 DIAGNOSIS — N186 End stage renal disease: Secondary | ICD-10-CM | POA: Diagnosis not present

## 2023-08-26 DIAGNOSIS — N2581 Secondary hyperparathyroidism of renal origin: Secondary | ICD-10-CM | POA: Diagnosis not present

## 2023-08-28 DIAGNOSIS — N186 End stage renal disease: Secondary | ICD-10-CM | POA: Diagnosis not present

## 2023-08-28 DIAGNOSIS — N2581 Secondary hyperparathyroidism of renal origin: Secondary | ICD-10-CM | POA: Diagnosis not present

## 2023-08-28 DIAGNOSIS — D509 Iron deficiency anemia, unspecified: Secondary | ICD-10-CM | POA: Diagnosis not present

## 2023-08-28 DIAGNOSIS — D631 Anemia in chronic kidney disease: Secondary | ICD-10-CM | POA: Diagnosis not present

## 2023-08-31 DIAGNOSIS — D509 Iron deficiency anemia, unspecified: Secondary | ICD-10-CM | POA: Diagnosis not present

## 2023-08-31 DIAGNOSIS — I82409 Acute embolism and thrombosis of unspecified deep veins of unspecified lower extremity: Secondary | ICD-10-CM | POA: Diagnosis not present

## 2023-08-31 DIAGNOSIS — N186 End stage renal disease: Secondary | ICD-10-CM | POA: Diagnosis not present

## 2023-08-31 DIAGNOSIS — D631 Anemia in chronic kidney disease: Secondary | ICD-10-CM | POA: Diagnosis not present

## 2023-08-31 DIAGNOSIS — N2581 Secondary hyperparathyroidism of renal origin: Secondary | ICD-10-CM | POA: Diagnosis not present

## 2023-09-02 DIAGNOSIS — D509 Iron deficiency anemia, unspecified: Secondary | ICD-10-CM | POA: Diagnosis not present

## 2023-09-02 DIAGNOSIS — N186 End stage renal disease: Secondary | ICD-10-CM | POA: Diagnosis not present

## 2023-09-02 DIAGNOSIS — N2581 Secondary hyperparathyroidism of renal origin: Secondary | ICD-10-CM | POA: Diagnosis not present

## 2023-09-02 DIAGNOSIS — D631 Anemia in chronic kidney disease: Secondary | ICD-10-CM | POA: Diagnosis not present

## 2023-09-04 DIAGNOSIS — N2581 Secondary hyperparathyroidism of renal origin: Secondary | ICD-10-CM | POA: Diagnosis not present

## 2023-09-04 DIAGNOSIS — D509 Iron deficiency anemia, unspecified: Secondary | ICD-10-CM | POA: Diagnosis not present

## 2023-09-04 DIAGNOSIS — D631 Anemia in chronic kidney disease: Secondary | ICD-10-CM | POA: Diagnosis not present

## 2023-09-04 DIAGNOSIS — N186 End stage renal disease: Secondary | ICD-10-CM | POA: Diagnosis not present

## 2023-09-06 DIAGNOSIS — Z992 Dependence on renal dialysis: Secondary | ICD-10-CM | POA: Diagnosis not present

## 2023-09-06 DIAGNOSIS — N186 End stage renal disease: Secondary | ICD-10-CM | POA: Diagnosis not present

## 2023-09-07 DIAGNOSIS — I82409 Acute embolism and thrombosis of unspecified deep veins of unspecified lower extremity: Secondary | ICD-10-CM | POA: Diagnosis not present

## 2023-09-07 DIAGNOSIS — D509 Iron deficiency anemia, unspecified: Secondary | ICD-10-CM | POA: Diagnosis not present

## 2023-09-07 DIAGNOSIS — D631 Anemia in chronic kidney disease: Secondary | ICD-10-CM | POA: Diagnosis not present

## 2023-09-07 DIAGNOSIS — N186 End stage renal disease: Secondary | ICD-10-CM | POA: Diagnosis not present

## 2023-09-07 DIAGNOSIS — N2581 Secondary hyperparathyroidism of renal origin: Secondary | ICD-10-CM | POA: Diagnosis not present

## 2023-09-09 DIAGNOSIS — N186 End stage renal disease: Secondary | ICD-10-CM | POA: Diagnosis not present

## 2023-09-09 DIAGNOSIS — N2581 Secondary hyperparathyroidism of renal origin: Secondary | ICD-10-CM | POA: Diagnosis not present

## 2023-09-09 DIAGNOSIS — D509 Iron deficiency anemia, unspecified: Secondary | ICD-10-CM | POA: Diagnosis not present

## 2023-09-09 DIAGNOSIS — D631 Anemia in chronic kidney disease: Secondary | ICD-10-CM | POA: Diagnosis not present

## 2023-09-11 DIAGNOSIS — D509 Iron deficiency anemia, unspecified: Secondary | ICD-10-CM | POA: Diagnosis not present

## 2023-09-11 DIAGNOSIS — N186 End stage renal disease: Secondary | ICD-10-CM | POA: Diagnosis not present

## 2023-09-11 DIAGNOSIS — N2581 Secondary hyperparathyroidism of renal origin: Secondary | ICD-10-CM | POA: Diagnosis not present

## 2023-09-11 DIAGNOSIS — D631 Anemia in chronic kidney disease: Secondary | ICD-10-CM | POA: Diagnosis not present

## 2023-09-14 DIAGNOSIS — N186 End stage renal disease: Secondary | ICD-10-CM | POA: Diagnosis not present

## 2023-09-14 DIAGNOSIS — I82409 Acute embolism and thrombosis of unspecified deep veins of unspecified lower extremity: Secondary | ICD-10-CM | POA: Diagnosis not present

## 2023-09-14 DIAGNOSIS — D631 Anemia in chronic kidney disease: Secondary | ICD-10-CM | POA: Diagnosis not present

## 2023-09-14 DIAGNOSIS — N2581 Secondary hyperparathyroidism of renal origin: Secondary | ICD-10-CM | POA: Diagnosis not present

## 2023-09-14 DIAGNOSIS — D509 Iron deficiency anemia, unspecified: Secondary | ICD-10-CM | POA: Diagnosis not present

## 2023-09-16 DIAGNOSIS — N186 End stage renal disease: Secondary | ICD-10-CM | POA: Diagnosis not present

## 2023-09-16 DIAGNOSIS — D509 Iron deficiency anemia, unspecified: Secondary | ICD-10-CM | POA: Diagnosis not present

## 2023-09-16 DIAGNOSIS — D631 Anemia in chronic kidney disease: Secondary | ICD-10-CM | POA: Diagnosis not present

## 2023-09-16 DIAGNOSIS — N2581 Secondary hyperparathyroidism of renal origin: Secondary | ICD-10-CM | POA: Diagnosis not present

## 2023-09-18 DIAGNOSIS — D509 Iron deficiency anemia, unspecified: Secondary | ICD-10-CM | POA: Diagnosis not present

## 2023-09-18 DIAGNOSIS — N186 End stage renal disease: Secondary | ICD-10-CM | POA: Diagnosis not present

## 2023-09-18 DIAGNOSIS — N2581 Secondary hyperparathyroidism of renal origin: Secondary | ICD-10-CM | POA: Diagnosis not present

## 2023-09-18 DIAGNOSIS — D631 Anemia in chronic kidney disease: Secondary | ICD-10-CM | POA: Diagnosis not present

## 2023-09-21 DIAGNOSIS — N2581 Secondary hyperparathyroidism of renal origin: Secondary | ICD-10-CM | POA: Diagnosis not present

## 2023-09-21 DIAGNOSIS — D509 Iron deficiency anemia, unspecified: Secondary | ICD-10-CM | POA: Diagnosis not present

## 2023-09-21 DIAGNOSIS — N186 End stage renal disease: Secondary | ICD-10-CM | POA: Diagnosis not present

## 2023-09-21 DIAGNOSIS — D631 Anemia in chronic kidney disease: Secondary | ICD-10-CM | POA: Diagnosis not present

## 2023-09-21 DIAGNOSIS — I82409 Acute embolism and thrombosis of unspecified deep veins of unspecified lower extremity: Secondary | ICD-10-CM | POA: Diagnosis not present

## 2023-09-23 DIAGNOSIS — D631 Anemia in chronic kidney disease: Secondary | ICD-10-CM | POA: Diagnosis not present

## 2023-09-23 DIAGNOSIS — N186 End stage renal disease: Secondary | ICD-10-CM | POA: Diagnosis not present

## 2023-09-23 DIAGNOSIS — N2581 Secondary hyperparathyroidism of renal origin: Secondary | ICD-10-CM | POA: Diagnosis not present

## 2023-09-23 DIAGNOSIS — D509 Iron deficiency anemia, unspecified: Secondary | ICD-10-CM | POA: Diagnosis not present

## 2023-09-25 DIAGNOSIS — N2581 Secondary hyperparathyroidism of renal origin: Secondary | ICD-10-CM | POA: Diagnosis not present

## 2023-09-25 DIAGNOSIS — D631 Anemia in chronic kidney disease: Secondary | ICD-10-CM | POA: Diagnosis not present

## 2023-09-25 DIAGNOSIS — D509 Iron deficiency anemia, unspecified: Secondary | ICD-10-CM | POA: Diagnosis not present

## 2023-09-25 DIAGNOSIS — N186 End stage renal disease: Secondary | ICD-10-CM | POA: Diagnosis not present

## 2023-09-28 DIAGNOSIS — N2581 Secondary hyperparathyroidism of renal origin: Secondary | ICD-10-CM | POA: Diagnosis not present

## 2023-09-28 DIAGNOSIS — D631 Anemia in chronic kidney disease: Secondary | ICD-10-CM | POA: Diagnosis not present

## 2023-09-28 DIAGNOSIS — D509 Iron deficiency anemia, unspecified: Secondary | ICD-10-CM | POA: Diagnosis not present

## 2023-09-28 DIAGNOSIS — N186 End stage renal disease: Secondary | ICD-10-CM | POA: Diagnosis not present

## 2023-09-30 DIAGNOSIS — N186 End stage renal disease: Secondary | ICD-10-CM | POA: Diagnosis not present

## 2023-09-30 DIAGNOSIS — Z7901 Long term (current) use of anticoagulants: Secondary | ICD-10-CM | POA: Diagnosis not present

## 2023-09-30 DIAGNOSIS — D509 Iron deficiency anemia, unspecified: Secondary | ICD-10-CM | POA: Diagnosis not present

## 2023-09-30 DIAGNOSIS — N2581 Secondary hyperparathyroidism of renal origin: Secondary | ICD-10-CM | POA: Diagnosis not present

## 2023-09-30 DIAGNOSIS — D631 Anemia in chronic kidney disease: Secondary | ICD-10-CM | POA: Diagnosis not present

## 2023-10-02 DIAGNOSIS — D631 Anemia in chronic kidney disease: Secondary | ICD-10-CM | POA: Diagnosis not present

## 2023-10-02 DIAGNOSIS — N2581 Secondary hyperparathyroidism of renal origin: Secondary | ICD-10-CM | POA: Diagnosis not present

## 2023-10-02 DIAGNOSIS — D509 Iron deficiency anemia, unspecified: Secondary | ICD-10-CM | POA: Diagnosis not present

## 2023-10-02 DIAGNOSIS — N186 End stage renal disease: Secondary | ICD-10-CM | POA: Diagnosis not present

## 2023-10-05 DIAGNOSIS — D509 Iron deficiency anemia, unspecified: Secondary | ICD-10-CM | POA: Diagnosis not present

## 2023-10-05 DIAGNOSIS — D631 Anemia in chronic kidney disease: Secondary | ICD-10-CM | POA: Diagnosis not present

## 2023-10-05 DIAGNOSIS — N186 End stage renal disease: Secondary | ICD-10-CM | POA: Diagnosis not present

## 2023-10-05 DIAGNOSIS — N2581 Secondary hyperparathyroidism of renal origin: Secondary | ICD-10-CM | POA: Diagnosis not present

## 2023-10-06 DIAGNOSIS — Z992 Dependence on renal dialysis: Secondary | ICD-10-CM | POA: Diagnosis not present

## 2023-10-06 DIAGNOSIS — I4719 Other supraventricular tachycardia: Secondary | ICD-10-CM | POA: Diagnosis not present

## 2023-10-06 DIAGNOSIS — R002 Palpitations: Secondary | ICD-10-CM | POA: Diagnosis not present

## 2023-10-06 DIAGNOSIS — I10 Essential (primary) hypertension: Secondary | ICD-10-CM | POA: Diagnosis not present

## 2023-10-06 DIAGNOSIS — N186 End stage renal disease: Secondary | ICD-10-CM | POA: Diagnosis not present

## 2023-10-07 DIAGNOSIS — N186 End stage renal disease: Secondary | ICD-10-CM | POA: Diagnosis not present

## 2023-10-07 DIAGNOSIS — N2581 Secondary hyperparathyroidism of renal origin: Secondary | ICD-10-CM | POA: Diagnosis not present

## 2023-10-07 DIAGNOSIS — D509 Iron deficiency anemia, unspecified: Secondary | ICD-10-CM | POA: Diagnosis not present

## 2023-10-07 DIAGNOSIS — D631 Anemia in chronic kidney disease: Secondary | ICD-10-CM | POA: Diagnosis not present

## 2023-10-07 DIAGNOSIS — Z23 Encounter for immunization: Secondary | ICD-10-CM | POA: Diagnosis not present

## 2023-10-09 DIAGNOSIS — D631 Anemia in chronic kidney disease: Secondary | ICD-10-CM | POA: Diagnosis not present

## 2023-10-09 DIAGNOSIS — Z23 Encounter for immunization: Secondary | ICD-10-CM | POA: Diagnosis not present

## 2023-10-09 DIAGNOSIS — D509 Iron deficiency anemia, unspecified: Secondary | ICD-10-CM | POA: Diagnosis not present

## 2023-10-09 DIAGNOSIS — N2581 Secondary hyperparathyroidism of renal origin: Secondary | ICD-10-CM | POA: Diagnosis not present

## 2023-10-12 DIAGNOSIS — N2581 Secondary hyperparathyroidism of renal origin: Secondary | ICD-10-CM | POA: Diagnosis not present

## 2023-10-12 DIAGNOSIS — Z23 Encounter for immunization: Secondary | ICD-10-CM | POA: Diagnosis not present

## 2023-10-12 DIAGNOSIS — N186 End stage renal disease: Secondary | ICD-10-CM | POA: Diagnosis not present

## 2023-10-12 DIAGNOSIS — D509 Iron deficiency anemia, unspecified: Secondary | ICD-10-CM | POA: Diagnosis not present

## 2023-10-12 DIAGNOSIS — I82409 Acute embolism and thrombosis of unspecified deep veins of unspecified lower extremity: Secondary | ICD-10-CM | POA: Diagnosis not present

## 2023-10-12 DIAGNOSIS — D631 Anemia in chronic kidney disease: Secondary | ICD-10-CM | POA: Diagnosis not present

## 2023-10-14 DIAGNOSIS — N2581 Secondary hyperparathyroidism of renal origin: Secondary | ICD-10-CM | POA: Diagnosis not present

## 2023-10-15 NOTE — Progress Notes (Signed)
 EDLA PARA                                          MRN: [id number]   10/15/2023   The VBCI Quality Team Specialist reviewed this patient medical record for the purposes of chart review for care gap closure. The following were reviewed: chart review for care gap closure-glycemic status assessment.    VBCI Quality Team

## 2023-10-19 DIAGNOSIS — N186 End stage renal disease: Secondary | ICD-10-CM | POA: Diagnosis not present

## 2023-10-23 DIAGNOSIS — D631 Anemia in chronic kidney disease: Secondary | ICD-10-CM | POA: Diagnosis not present

## 2023-10-26 DIAGNOSIS — I82409 Acute embolism and thrombosis of unspecified deep veins of unspecified lower extremity: Secondary | ICD-10-CM | POA: Diagnosis not present

## 2023-10-26 DIAGNOSIS — D509 Iron deficiency anemia, unspecified: Secondary | ICD-10-CM | POA: Diagnosis not present

## 2023-10-27 DIAGNOSIS — E569 Vitamin deficiency, unspecified: Secondary | ICD-10-CM | POA: Diagnosis not present

## 2023-10-27 DIAGNOSIS — D6869 Other thrombophilia: Secondary | ICD-10-CM | POA: Diagnosis not present

## 2023-10-27 DIAGNOSIS — E213 Hyperparathyroidism, unspecified: Secondary | ICD-10-CM | POA: Diagnosis not present

## 2023-10-27 DIAGNOSIS — E785 Hyperlipidemia, unspecified: Secondary | ICD-10-CM | POA: Diagnosis not present

## 2023-10-27 DIAGNOSIS — I4891 Unspecified atrial fibrillation: Secondary | ICD-10-CM | POA: Diagnosis not present

## 2023-10-27 DIAGNOSIS — R030 Elevated blood-pressure reading, without diagnosis of hypertension: Secondary | ICD-10-CM | POA: Diagnosis not present

## 2023-10-27 DIAGNOSIS — Z96649 Presence of unspecified artificial hip joint: Secondary | ICD-10-CM | POA: Diagnosis not present

## 2023-10-27 DIAGNOSIS — D6861 Antiphospholipid syndrome: Secondary | ICD-10-CM | POA: Diagnosis not present

## 2023-10-27 DIAGNOSIS — D84821 Immunodeficiency due to drugs: Secondary | ICD-10-CM | POA: Diagnosis not present

## 2023-10-27 DIAGNOSIS — N2581 Secondary hyperparathyroidism of renal origin: Secondary | ICD-10-CM | POA: Diagnosis not present

## 2023-10-27 DIAGNOSIS — D8481 Immunodeficiency due to conditions classified elsewhere: Secondary | ICD-10-CM | POA: Diagnosis not present

## 2023-10-27 DIAGNOSIS — N186 End stage renal disease: Secondary | ICD-10-CM | POA: Diagnosis not present

## 2023-10-27 DIAGNOSIS — Z801 Family history of malignant neoplasm of trachea, bronchus and lung: Secondary | ICD-10-CM | POA: Diagnosis not present

## 2023-10-27 DIAGNOSIS — Z992 Dependence on renal dialysis: Secondary | ICD-10-CM | POA: Diagnosis not present

## 2023-10-27 DIAGNOSIS — H409 Unspecified glaucoma: Secondary | ICD-10-CM | POA: Diagnosis not present

## 2023-10-27 DIAGNOSIS — Z86718 Personal history of other venous thrombosis and embolism: Secondary | ICD-10-CM | POA: Diagnosis not present

## 2023-10-27 DIAGNOSIS — I739 Peripheral vascular disease, unspecified: Secondary | ICD-10-CM | POA: Diagnosis not present

## 2023-10-27 DIAGNOSIS — M329 Systemic lupus erythematosus, unspecified: Secondary | ICD-10-CM | POA: Diagnosis not present

## 2023-10-27 DIAGNOSIS — L309 Dermatitis, unspecified: Secondary | ICD-10-CM | POA: Diagnosis not present

## 2023-10-30 DIAGNOSIS — N186 End stage renal disease: Secondary | ICD-10-CM | POA: Diagnosis not present

## 2023-10-30 DIAGNOSIS — Z23 Encounter for immunization: Secondary | ICD-10-CM | POA: Diagnosis not present

## 2023-10-30 DIAGNOSIS — N2581 Secondary hyperparathyroidism of renal origin: Secondary | ICD-10-CM | POA: Diagnosis not present

## 2023-10-30 DIAGNOSIS — D631 Anemia in chronic kidney disease: Secondary | ICD-10-CM | POA: Diagnosis not present

## 2023-11-02 DIAGNOSIS — D631 Anemia in chronic kidney disease: Secondary | ICD-10-CM | POA: Diagnosis not present

## 2023-11-02 DIAGNOSIS — Z23 Encounter for immunization: Secondary | ICD-10-CM | POA: Diagnosis not present

## 2023-11-02 DIAGNOSIS — D509 Iron deficiency anemia, unspecified: Secondary | ICD-10-CM | POA: Diagnosis not present

## 2023-11-02 DIAGNOSIS — N186 End stage renal disease: Secondary | ICD-10-CM | POA: Diagnosis not present

## 2023-11-02 DIAGNOSIS — I82409 Acute embolism and thrombosis of unspecified deep veins of unspecified lower extremity: Secondary | ICD-10-CM | POA: Diagnosis not present

## 2023-11-02 DIAGNOSIS — N2581 Secondary hyperparathyroidism of renal origin: Secondary | ICD-10-CM | POA: Diagnosis not present

## 2023-11-04 DIAGNOSIS — Z23 Encounter for immunization: Secondary | ICD-10-CM | POA: Diagnosis not present

## 2023-11-04 DIAGNOSIS — N186 End stage renal disease: Secondary | ICD-10-CM | POA: Diagnosis not present

## 2023-11-04 DIAGNOSIS — N2581 Secondary hyperparathyroidism of renal origin: Secondary | ICD-10-CM | POA: Diagnosis not present

## 2023-11-04 DIAGNOSIS — D631 Anemia in chronic kidney disease: Secondary | ICD-10-CM | POA: Diagnosis not present

## 2023-11-05 DIAGNOSIS — Z79899 Other long term (current) drug therapy: Secondary | ICD-10-CM | POA: Diagnosis not present

## 2023-11-05 DIAGNOSIS — N186 End stage renal disease: Secondary | ICD-10-CM | POA: Diagnosis not present

## 2023-11-05 DIAGNOSIS — M329 Systemic lupus erythematosus, unspecified: Secondary | ICD-10-CM | POA: Diagnosis not present

## 2023-11-06 DIAGNOSIS — N2581 Secondary hyperparathyroidism of renal origin: Secondary | ICD-10-CM | POA: Diagnosis not present

## 2023-11-06 DIAGNOSIS — Z992 Dependence on renal dialysis: Secondary | ICD-10-CM | POA: Diagnosis not present

## 2023-11-06 DIAGNOSIS — Z23 Encounter for immunization: Secondary | ICD-10-CM | POA: Diagnosis not present

## 2023-11-06 DIAGNOSIS — N186 End stage renal disease: Secondary | ICD-10-CM | POA: Diagnosis not present

## 2023-11-06 DIAGNOSIS — D509 Iron deficiency anemia, unspecified: Secondary | ICD-10-CM | POA: Diagnosis not present

## 2023-11-06 DIAGNOSIS — D631 Anemia in chronic kidney disease: Secondary | ICD-10-CM | POA: Diagnosis not present

## 2023-11-07 DIAGNOSIS — D509 Iron deficiency anemia, unspecified: Secondary | ICD-10-CM | POA: Diagnosis not present

## 2023-11-07 DIAGNOSIS — N2581 Secondary hyperparathyroidism of renal origin: Secondary | ICD-10-CM | POA: Diagnosis not present

## 2023-11-07 DIAGNOSIS — N186 End stage renal disease: Secondary | ICD-10-CM | POA: Diagnosis not present

## 2023-11-07 DIAGNOSIS — D631 Anemia in chronic kidney disease: Secondary | ICD-10-CM | POA: Diagnosis not present

## 2023-11-09 DIAGNOSIS — I82409 Acute embolism and thrombosis of unspecified deep veins of unspecified lower extremity: Secondary | ICD-10-CM | POA: Diagnosis not present

## 2023-11-10 ENCOUNTER — Other Ambulatory Visit: Payer: Self-pay | Admitting: Family Medicine

## 2023-11-10 DIAGNOSIS — Z1231 Encounter for screening mammogram for malignant neoplasm of breast: Secondary | ICD-10-CM

## 2023-11-11 DIAGNOSIS — E119 Type 2 diabetes mellitus without complications: Secondary | ICD-10-CM | POA: Diagnosis not present

## 2023-11-12 ENCOUNTER — Encounter: Payer: Self-pay | Admitting: Family Medicine

## 2023-11-12 ENCOUNTER — Ambulatory Visit (INDEPENDENT_AMBULATORY_CARE_PROVIDER_SITE_OTHER)

## 2023-11-12 VITALS — BP 121/56 | HR 73 | Ht 61.5 in | Wt 143.0 lb

## 2023-11-12 DIAGNOSIS — Z0000 Encounter for general adult medical examination without abnormal findings: Secondary | ICD-10-CM

## 2023-11-12 NOTE — Patient Instructions (Signed)
 [name],  Thank you for taking the time for your Medicare Wellness Visit. I appreciate your continued commitment to your health goals. Please review the care plan we discussed, and feel free to reach out if I can assist you further.  Please note that Annual Wellness Visits do not include a physical exam. Some assessments may be limited, especially if the visit was conducted virtually. If needed, we may recommend an in-person follow-up with your provider.  Ongoing Care Seeing your primary care provider every 3 to 6 months helps us  monitor your health and provide consistent, personalized care.   Referrals If a referral was made during today's visit and you haven't received any updates within two weeks, please contact the referred provider directly to check on the status.  Recommended Screenings:  Health Maintenance  Topic Date Due   Zoster (Shingles) Vaccine (1 of 2) Never done   COVID-19 Vaccine (4 - 2025-26 season) 09/07/2023   Medicare Annual Wellness Visit  11/03/2023   DEXA scan (bone density measurement)  11/15/2023   DTaP/Tdap/Td vaccine (3 - Td or Tdap) 09/06/2024   Breast Cancer Screening  10/22/2024   Colon Cancer Screening  01/06/2028   Pneumococcal Vaccine for age over 9  Completed   Flu Shot  Completed   Hepatitis C Screening  Completed   Meningitis B Vaccine  Aged Out   Hepatitis B Vaccine  Discontinued       11/12/2023    8:50 AM  Advanced Directives  Does Patient Have a Medical Advance Directive? No  Would patient like information on creating a medical advance directive? No - Patient declined    Vision: Annual vision screenings are recommended for early detection of glaucoma, cataracts, and diabetic retinopathy. These exams can also reveal signs of chronic conditions such as diabetes and high blood pressure.  Dental: Annual dental screenings help detect early signs of oral cancer, gum disease, and other conditions linked to overall health, including heart  disease and diabetes.

## 2023-11-12 NOTE — Progress Notes (Signed)
 Subjective:   Joy Anderson is a 73 y.o. female who presents for a Medicare Annual Wellness Visit.  Allergies (verified) Ace inhibitors, Metformin , Fish oil, and Metformin  and related   History: Past Medical History:  Diagnosis Date   Antiphospholipid antibody with hypercoagulable state 08/27/2010   DVT (deep venous thrombosis) (HCC)    anti phospholipid antibody + -- Dr Zannie   Hypertension    OAB (overactive bladder)    off meds   Post-menopausal    Proteinuria    Dr Glean   Raynaud's disease    Past Surgical History:  Procedure Laterality Date   AV FISTULA PLACEMENT  01/06/2014   CATARACT EXTRACTION, BILATERAL  01/07/2015   HIP SURGERY Right 10/2018   left one done 10/2019   TOTAL ABDOMINAL HYSTERECTOMY  01/07/1999   for fibroids w/ 1 oophorectomy   Family History  Problem Relation Age of Onset   Alcohol abuse Father    Colon cancer Brother    Prostate cancer Brother    Cerebral palsy Brother    Diabetes Other    Social History   Occupational History   Occupation: Retired  Tobacco Use   Smoking status: Never   Smokeless tobacco: Never  Vaping Use   Vaping status: Never Used  Substance and Sexual Activity   Alcohol use: Yes    Comment: occasional   Drug use: No   Sexual activity: Not on file   Tobacco Counseling Counseling given: Not Answered  SDOH Screenings   Food Insecurity: No Food Insecurity (11/12/2023)  Housing: Low Risk  (11/12/2023)  Transportation Needs: No Transportation Needs (11/12/2023)  Utilities: Not At Risk (11/12/2023)  Alcohol Screen: Low Risk  (11/03/2022)  Depression (PHQ2-9): Low Risk  (11/12/2023)  Financial Resource Strain: Low Risk  (11/03/2022)  Physical Activity: Sufficiently Active (11/12/2023)  Social Connections: Moderately Isolated (11/12/2023)  Stress: No Stress Concern Present (11/12/2023)  Tobacco Use: Low Risk  (11/12/2023)  Recent Concern: Tobacco Use - Medium Risk (11/05/2023)   Received from [facility] Literacy: Adequate Health Literacy (11/12/2023)   Depression Screen    11/12/2023    8:57 AM 11/03/2022    3:19 PM 06/10/2022    2:38 PM 11/01/2021    3:11 PM 02/12/2021   10:41 AM 10/19/2020    4:06 PM 09/28/2020    2:16 PM  PHQ 2/9 Scores  PHQ - 2 Score 0 0 0 0 0 0 0     Goals Addressed             This Visit's Progress    Patient Stated       Patient states she would like to continue to stay healthy.        Visit info / Clinical Intake: Medicare Wellness Visit Type:: Subsequent Annual Wellness Visit Medicare Wellness Visit Mode:: In-person (required for WTM) Interpreter Needed?: No Pre-visit prep was completed: yes AWV questionnaire completed by patient prior to visit?: no Living arrangements:: with family/others Patient's Overall Health Status Rating: very good Typical amount of pain: none Does pain affect daily life?: no Are you currently prescribed opioids?: no  Dietary Habits and Nutritional Risks How many meals a day?: 2 Eats fruit and vegetables daily?: (!) no Most meals are obtained by: preparing own meals Diabetic:: no  Functional Status Activities of Daily Living (to include ambulation/medication): Independent Ambulation: Independent Medication Administration: Independent Home Management: Independent Manage your own finances?: yes Primary transportation is: driving Concerns about vision?: no *vision screening is required for  WTM* Concerns about hearing?: no  Fall Screening Falls in the past year?: 0 Number of falls in past year: 0 Was there an injury with Fall?: 0 Fall Risk Category Calculator: 0 Patient Fall Risk Level: Low Fall Risk  Fall Risk Patient at Risk for Falls Due to: No Fall Risks Fall risk Follow up: Falls evaluation completed  Home and Transportation Safety: All rugs have non-skid backing?: yes All stairs or steps have railings?: yes Grab bars in the bathtub or shower?: yes Have non-skid surface in bathtub or shower?:  yes Good home lighting?: yes Regular seat belt use?: yes Hospital stays in the last year:: no  Cognitive Assessment Difficulty concentrating, remembering, or making decisions? : no Will 6CIT or Mini Cog be Completed: yes What year is it?: 0 points What month is it?: 0 points Give patient an address phrase to remember (5 components): [address] [place], [place] [address] About what time is it?: 0 points Count backwards from 20 to 1: 0 points Say the months of the year in reverse: 0 points Repeat the address phrase from earlier: 0 points 6 CIT Score: 0 points  Advance Directives (For Healthcare) Does Patient Have a Medical Advance Directive?: No Would patient like information on creating a medical advance directive?: No - Patient declined  Reviewed/Updated  Reviewed/Updated: Medical History; Medications; Allergies; Care Teams; Patient Goals        Objective:    Today's Vitals   11/12/23 0840  BP: (!) 121/56  Pulse: 73  SpO2: 97%  Weight: 143 lb (64.9 kg)  Height: 5' 1.5 (1.562 m)   Body mass index is 26.58 kg/m.  Current Medications (verified) Outpatient Encounter Medications as of 11/12/2023  Medication Sig   acetaminophen  (TYLENOL ) 500 MG tablet Take 500 mg by mouth every 6 (six) hours as needed. Two tablets   AMBULATORY NON FORMULARY MEDICATION Medication Name: Shower chair   atorvastatin  (LIPITOR) 20 MG tablet Take 1 tablet (20 mg total) by mouth at bedtime. Due for follow up visit   CALCIUM  PO Take by mouth.   cinacalcet  (SENSIPAR ) 30 MG tablet Take 2 tablets by mouth daily. (Patient taking differently: Take 3 tablets by mouth daily.)   dorzolamide-timolol (COSOPT) 2-0.5 % ophthalmic solution 1 drop 2 (two) times daily.   hydrocortisone  2.5 % cream Apply 1 Application topically 2 (two) times daily as needed.   hydroxychloroquine  (PLAQUENIL ) 200 MG tablet Take 200 mg by mouth daily. Takes it once a day.   latanoprost (XALATAN) 0.005 % ophthalmic solution Apply to  eye.   multivitamin (RENA-VIT) TABS tablet    warfarin (COUMADIN ) 1 MG tablet    warfarin (COUMADIN ) 4 MG tablet Take by mouth. Patient takes 4.5 mg daily   warfarin (COUMADIN ) 5 MG tablet SMARTSIG:1 Tablet(s) By Mouth Every Evening   No facility-administered encounter medications on file as of 11/12/2023.   Hearing/Vision screen No results found. Immunizations and Health Maintenance Health Maintenance  Topic Date Due   Zoster Vaccines- Shingrix (1 of 2) Never done   COVID-19 Vaccine (4 - 2025-26 season) 09/07/2023   DEXA SCAN  11/15/2023   DTaP/Tdap/Td (3 - Td or Tdap) 09/06/2024   Mammogram  10/22/2024   Medicare Annual Wellness (AWV)  11/11/2024   Colonoscopy  01/06/2028   Pneumococcal Vaccine: 50+ Years  Completed   Influenza Vaccine  Completed   Hepatitis C Screening  Completed   Meningococcal B Vaccine  Aged Out   Hepatitis B Vaccines 19-59 Average Risk  Discontinued  Assessment/Plan:  This is a routine wellness examination for [place].  Patient Care Team: Alvan Dorothyann BIRCH, MD as PCP - General (Family Medicine) Christobal Cao, MD as Referring Physician (Internal Medicine) Elnor Mulders, MD as Referring Physician (Ophthalmology) Jefrey Bruckner, MD as Referring Physician (Internal Medicine) Ruther Blunt, MD as Referring Physician (Internal Medicine) Powers, Norleen BROCKS, MD as Referring Physician (Cardiology)  I have personally reviewed and noted the following in the patient's chart:   Medical and social history Use of alcohol, tobacco or illicit drugs  Current medications and supplements including opioid prescriptions. Functional ability and status Nutritional status Physical activity Advanced directives List of other physicians Hospitalizations, surgeries, and ER visits in previous 12 months Vitals Screenings to include cognitive, depression, and falls Referrals and appointments  No orders of the defined types were placed in this encounter.  In  addition, I have reviewed and discussed with patient certain preventive protocols, quality metrics, and best practice recommendations. A written personalized care plan for preventive services as well as general preventive health recommendations were provided to patient.   Bonny Jon Mayor, CMA   11/12/2023   Return in 1 year (on 11/11/2024).  After Visit Summary: (In Person-Printed) AVS printed and given to the patient  Nurse Notes:   Joy Anderson is a 73 y.o. female patient of Metheney, Dorothyann BIRCH, MD who had a Medicare Annual Wellness Visit today via telephone. Joy Anderson is Retired and lives with their daughter. She has 3 children. She reports that she is socially active and does interact with friends/family regularly. She is moderately physically active and enjoys sewing.

## 2023-11-16 DIAGNOSIS — N186 End stage renal disease: Secondary | ICD-10-CM | POA: Diagnosis not present

## 2023-11-16 DIAGNOSIS — D509 Iron deficiency anemia, unspecified: Secondary | ICD-10-CM | POA: Diagnosis not present

## 2023-11-16 DIAGNOSIS — I82409 Acute embolism and thrombosis of unspecified deep veins of unspecified lower extremity: Secondary | ICD-10-CM | POA: Diagnosis not present

## 2023-11-16 DIAGNOSIS — D631 Anemia in chronic kidney disease: Secondary | ICD-10-CM | POA: Diagnosis not present

## 2023-11-16 DIAGNOSIS — N2581 Secondary hyperparathyroidism of renal origin: Secondary | ICD-10-CM | POA: Diagnosis not present

## 2023-11-17 DIAGNOSIS — M329 Systemic lupus erythematosus, unspecified: Secondary | ICD-10-CM | POA: Diagnosis not present

## 2023-11-18 DIAGNOSIS — D631 Anemia in chronic kidney disease: Secondary | ICD-10-CM | POA: Diagnosis not present

## 2023-11-18 DIAGNOSIS — D509 Iron deficiency anemia, unspecified: Secondary | ICD-10-CM | POA: Diagnosis not present

## 2023-11-18 DIAGNOSIS — N2581 Secondary hyperparathyroidism of renal origin: Secondary | ICD-10-CM | POA: Diagnosis not present

## 2023-11-19 ENCOUNTER — Encounter: Payer: Self-pay | Admitting: Family Medicine

## 2023-11-19 ENCOUNTER — Other Ambulatory Visit: Payer: Self-pay | Admitting: *Deleted

## 2023-11-19 ENCOUNTER — Ambulatory Visit (INDEPENDENT_AMBULATORY_CARE_PROVIDER_SITE_OTHER)

## 2023-11-19 ENCOUNTER — Ambulatory Visit: Admitting: Family Medicine

## 2023-11-19 VITALS — BP 112/52 | HR 65 | Ht 61.5 in | Wt 145.0 lb

## 2023-11-19 DIAGNOSIS — M5431 Sciatica, right side: Secondary | ICD-10-CM

## 2023-11-19 DIAGNOSIS — M5416 Radiculopathy, lumbar region: Secondary | ICD-10-CM | POA: Diagnosis not present

## 2023-11-19 DIAGNOSIS — M47816 Spondylosis without myelopathy or radiculopathy, lumbar region: Secondary | ICD-10-CM | POA: Diagnosis not present

## 2023-11-19 DIAGNOSIS — M329 Systemic lupus erythematosus, unspecified: Secondary | ICD-10-CM | POA: Diagnosis not present

## 2023-11-19 DIAGNOSIS — M21619 Bunion of unspecified foot: Secondary | ICD-10-CM | POA: Diagnosis not present

## 2023-11-19 DIAGNOSIS — M25551 Pain in right hip: Secondary | ICD-10-CM | POA: Diagnosis not present

## 2023-11-19 DIAGNOSIS — M160 Bilateral primary osteoarthritis of hip: Secondary | ICD-10-CM

## 2023-11-19 DIAGNOSIS — Z780 Asymptomatic menopausal state: Secondary | ICD-10-CM

## 2023-11-19 NOTE — Patient Instructions (Signed)
 Let me know if you are not improving over the next 3 to 4 weeks.

## 2023-11-19 NOTE — Progress Notes (Signed)
 Established Patient Office Visit  Patient ID: Joy Anderson, female    DOB: Mar 13, 1950  Age: 73 y.o. MRN: [id number] PCP: Alvan Dorothyann BIRCH, MD  Chief Complaint  Patient presents with   Hip Pain    R hip pain she stated that it may be nerve pain that is radiating down to her foot. It comes and goes walking is worse.     Subjective:     HPI  Discussed the use of AI scribe software for clinical note transcription with the patient, who gave verbal consent to proceed.  History of Present Illness Joy Anderson is a 73 year old female who presents with lower back pain radiating to the leg and foot.  Lumbar radicular pain - Sharp, occasionally pulsating pain originating in the lower back and radiating down the lateral aspect of the leg, sometimes reaching the foot - Pain is intermittent, lasting for a few seconds before subsiding - Pain is particularly noticeable when sitting - Pain is exacerbated by walking and sometimes sitting - No history of recent heavy lifting or specific injury - Bending and squatting are difficult - Heaviest items lifted are cast iron pots and pans at home - No issues with hip movement - Pain is new and has been ongoing since the last visit; exact onset unspecified - No use of heat or ice therapy - No medications taken for this issue - occ getting pulsating in her low back.    She would also like referral to podiatry for her bunions and some nail issues she has a preference for Dr. Selinda Anis.     ROS    Objective:     BP (!) 112/52   Pulse 65   Ht 5' 1.5 (1.562 m)   Wt 145 lb (65.8 kg)   LMP 03/18/1999   SpO2 97%   BMI 26.95 kg/m    Physical Exam Vitals reviewed.  Constitutional:      Appearance: Normal appearance.  HENT:     Head: Normocephalic.  Pulmonary:     Effort: Pulmonary effort is normal.  Musculoskeletal:     Comments: Tender over the lumbar spine and bilat SI joints.  Tender over the right trochanteric  bursa.   Neurological:     Mental Status: She is alert and oriented to person, place, and time.  Psychiatric:        Mood and Affect: Mood normal.        Behavior: Behavior normal.      No results found for any visits on 11/19/23.    The ASCVD Risk score (Arnett DK, et al., 2019) failed to calculate for the following reasons:   Cannot find a previous HDL lab   Cannot find a previous total cholesterol lab    Assessment & Plan:   Problem List Items Addressed This Visit       Musculoskeletal and Integument   SLE (systemic lupus erythematosus related syndrome) (HCC)   Osteoarthritis, hip, bilateral   Other Visit Diagnoses       Right hip pain    -  Primary     Lumbar back pain with radiculopathy affecting lower extremity       Relevant Orders   DG Lumbar Spine Complete   Ambulatory referral to Physical Therapy     Right sided sciatica       Relevant Orders   DG Lumbar Spine Complete   Ambulatory referral to Physical Therapy     Bunion  Relevant Orders   Ambulatory referral to Podiatry       Assessment and Plan Assessment & Plan Sciatica, Right  Chronic sciatica with radiating pain from lower back to foot, worsened by walking and bending. Differential includes disc issues at S1 level. - Ordered lumbar spine x-ray. - Referred to physical therapy, sessions on Tuesdays and Thursdays. - Provided home stretch handout. - Recommended heat or ice for relief.  Trochanteric bursitis of hip Mild tenderness over trochanteric bursa, not primary cause of symptoms.  Nail disorder Request for referral to podiatrist for nail issues and bunions. - Referred to podiatrist Selinda Palms.    Return if symptoms worsen or fail to improve.    Dorothyann Byars, MD [facility] at [facility]

## 2023-11-20 ENCOUNTER — Ambulatory Visit: Payer: Self-pay | Admitting: Family Medicine

## 2023-11-20 DIAGNOSIS — N2581 Secondary hyperparathyroidism of renal origin: Secondary | ICD-10-CM | POA: Diagnosis not present

## 2023-11-20 DIAGNOSIS — D509 Iron deficiency anemia, unspecified: Secondary | ICD-10-CM | POA: Diagnosis not present

## 2023-11-20 DIAGNOSIS — N186 End stage renal disease: Secondary | ICD-10-CM | POA: Diagnosis not present

## 2023-11-20 NOTE — Progress Notes (Signed)
 Hi Joy Anderson, x-ray of your low back shows some mild arthritis at the hinge parts of your spine.  I would really like to get you in for physical therapy for your back if you are okay with that then please let me know.  Have a great PT department here if this is convenient for you.

## 2023-11-20 NOTE — Assessment & Plan Note (Signed)
 Continue Plaquenil . Managed by Rheumatology. Back issues could be related.

## 2023-11-23 DIAGNOSIS — I82409 Acute embolism and thrombosis of unspecified deep veins of unspecified lower extremity: Secondary | ICD-10-CM | POA: Diagnosis not present

## 2023-11-24 ENCOUNTER — Ambulatory Visit

## 2023-11-24 DIAGNOSIS — Z1231 Encounter for screening mammogram for malignant neoplasm of breast: Secondary | ICD-10-CM

## 2023-11-24 NOTE — Telephone Encounter (Signed)
 Copied from CRM #[id number]. Topic: Clinical - Lab/Test Results >> Nov 24, 2023 12:20 PM Montie POUR wrote: Reason for CRM:  Please call Joy Anderson back to review her DG Lumbar Spine result. Clinic was at lunch when she called back. Her number is [phone]. Thanks

## 2023-11-24 NOTE — Telephone Encounter (Signed)
 Patient is aware of results. Voiced her understanding. Also provided her podiatries contact information. As well as the PT referral placed for her. She will reach out to them to schedule.

## 2023-11-26 ENCOUNTER — Ambulatory Visit: Payer: Self-pay | Admitting: Family Medicine

## 2023-11-26 NOTE — Progress Notes (Signed)
 Please call patient. Normal mammogram.  Repeat in 1 year.

## 2023-11-27 DIAGNOSIS — N186 End stage renal disease: Secondary | ICD-10-CM | POA: Diagnosis not present

## 2023-11-27 DIAGNOSIS — D631 Anemia in chronic kidney disease: Secondary | ICD-10-CM | POA: Diagnosis not present

## 2023-11-27 DIAGNOSIS — N2581 Secondary hyperparathyroidism of renal origin: Secondary | ICD-10-CM | POA: Diagnosis not present

## 2023-11-29 DIAGNOSIS — I82409 Acute embolism and thrombosis of unspecified deep veins of unspecified lower extremity: Secondary | ICD-10-CM | POA: Diagnosis not present

## 2023-11-29 DIAGNOSIS — N186 End stage renal disease: Secondary | ICD-10-CM | POA: Diagnosis not present

## 2023-12-01 DIAGNOSIS — Z7901 Long term (current) use of anticoagulants: Secondary | ICD-10-CM | POA: Diagnosis not present

## 2023-12-01 DIAGNOSIS — D509 Iron deficiency anemia, unspecified: Secondary | ICD-10-CM | POA: Diagnosis not present

## 2023-12-01 DIAGNOSIS — D631 Anemia in chronic kidney disease: Secondary | ICD-10-CM | POA: Diagnosis not present

## 2023-12-01 DIAGNOSIS — N2581 Secondary hyperparathyroidism of renal origin: Secondary | ICD-10-CM | POA: Diagnosis not present

## 2023-12-01 DIAGNOSIS — N186 End stage renal disease: Secondary | ICD-10-CM | POA: Diagnosis not present

## 2023-12-04 DIAGNOSIS — D509 Iron deficiency anemia, unspecified: Secondary | ICD-10-CM | POA: Diagnosis not present

## 2023-12-04 DIAGNOSIS — N186 End stage renal disease: Secondary | ICD-10-CM | POA: Diagnosis not present

## 2023-12-04 DIAGNOSIS — N2581 Secondary hyperparathyroidism of renal origin: Secondary | ICD-10-CM | POA: Diagnosis not present

## 2023-12-04 DIAGNOSIS — D631 Anemia in chronic kidney disease: Secondary | ICD-10-CM | POA: Diagnosis not present

## 2023-12-06 DIAGNOSIS — Z992 Dependence on renal dialysis: Secondary | ICD-10-CM | POA: Diagnosis not present

## 2023-12-06 DIAGNOSIS — N186 End stage renal disease: Secondary | ICD-10-CM | POA: Diagnosis not present

## 2023-12-10 ENCOUNTER — Ambulatory Visit: Admitting: Physical Therapy

## 2023-12-10 NOTE — Therapy (Incomplete)
 OUTPATIENT PHYSICAL THERAPY THORACOLUMBAR EVALUATION   Patient Name: Joy Anderson MRN: [id number] DOB:02-20-1950, 73 y.o., female Today's Date: 12/10/2023  END OF SESSION:   Past Medical History:  Diagnosis Date   Antiphospholipid antibody with hypercoagulable state 08/27/2010   DVT (deep venous thrombosis) (HCC)    anti phospholipid antibody + -- Dr Zannie   Hypertension    OAB (overactive bladder)    off meds   Post-menopausal    Proteinuria    Dr Glean   Raynaud's disease    Past Surgical History:  Procedure Laterality Date   AV FISTULA PLACEMENT  01/06/2014   CATARACT EXTRACTION, BILATERAL  01/07/2015   HIP SURGERY Right 10/2018   left one done 10/2019   TOTAL ABDOMINAL HYSTERECTOMY  01/07/1999   for fibroids w/ 1 oophorectomy   Patient Active Problem List   Diagnosis Date Noted   Hemorrhoids 09/29/2020   Bone infarct of distal femur, left with osteoarthritis 06/20/2020   Elevated triglycerides with high cholesterol 04/13/2017   Adnexal cyst 01/17/2017   Osteoarthritis, hip, bilateral 10/31/2015   Arteriovenous fistula for hemodialysis in place, primary 04/30/2015   ESRD on dialysis (HCC) 03/27/2015   Uremia 10/12/2014   Glaucoma of both eyes 07/06/2014   Pain in joint of left hip 05/25/2012   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   Antiphospholipid antibody with hypercoagulable state 08/27/2010   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   Vitamin D  deficiency 08/08/2010   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 07/15/2010   Gout 04/17/2010   Exophthalmos 12/12/2009   ANEMIA, IRON DEFICIENCY 11/20/2009   FATTY LIVER DISEASE 10/26/2009   PULMONARY HYPERTENSION, MILD 04/14/2008   PALPITATIONS 03/22/2008   RAYNAUD'S DISEASE 02/01/2008   SLE (systemic lupus erythematosus related syndrome) (HCC) 12/06/2007   Essential hypertension, benign 11/30/2007   POLYARTHRITIS 11/30/2007    PCP: Alvan Dorothyann BIRCH,  MD  REFERRING PROVIDER: Alvan Dorothyann BIRCH, MD  REFERRING DIAG: M54.16 (ICD-10-CM) - Lumbar back pain with radiculopathy affecting lower extremity M54.31 (ICD-10-CM) - Right sided sciatica  Rationale for Evaluation and Treatment: Rehabilitation  THERAPY DIAG:  No diagnosis found.  ONSET DATE: ***  SUBJECTIVE:                                                                                                                                                                                           SUBJECTIVE STATEMENT: ***  PERTINENT HISTORY:  DVT, HTN, raynauds, AV fistula, HTN, lupus, palpitations, gout, ESRD on dialysis  PAIN:  Are you having pain: *** Location/description: *** Best-worst over past week: ***  - aggravating factors: *** - Easing factors: ***  PRECAUTIONS: {Therapy precautions:24002}  RED FLAGS: {PT Red Flags:29287}   WEIGHT BEARING RESTRICTIONS: No  FALLS:  Has patient fallen in last 6 months? {fallsyesno:27318}  LIVING ENVIRONMENT: Lives with: {[facility] lives with:25569::lives with their family} Lives in: {Lives in:25570} Stairs: {opstairs:27293} Has following equipment at home: {Assistive devices:23999}  OCCUPATION: ***  PLOF: {PLOF:24004}  PATIENT GOALS: ***  NEXT MD VISIT: ***  OBJECTIVE:  Note: Objective measures were completed at Evaluation unless otherwise noted.  DIAGNOSTIC FINDINGS:  11/19/23 lumbar XR: FINDINGS: IVC filter to the right of the mid lumbar spine. Bilateral hip replacements. Lower lumbar facet degenerative change. Vertebral body heights are maintained. Relatively patent disc spaces.   IMPRESSION: Lower lumbar mild facet degenerative change.  PATIENT SURVEYS:  ODI: ***   COGNITION: Overall cognitive status: Within functional limits for tasks assessed     SENSATION: {sensation:27233}  LUMBAR ROM:   AROM eval  Flexion   Extension   Right lateral flexion   Left lateral flexion   Right rotation    Left rotation    (Blank rows = not tested) (Key: WFL = within functional limits not formally assessed, * = concordant pain, s = stiffness/stretching sensation, NT = not tested) Comment:   LOWER EXTREMITY ROM:     {AROM/PROM:27142}  Right eval Left eval  Hip flexion    Hip extension    Hip internal rotation    Hip external rotation    Knee extension    Knee flexion    (Blank rows = not tested) (Key: WFL = within functional limits not formally assessed, * = concordant pain, s = stiffness/stretching sensation, NT = not tested)  Comments:    LOWER EXTREMITY MMT:    MMT Right eval Left eval  Hip flexion    Hip abduction (modified sitting)    Hip internal rotation    Hip external rotation    Knee flexion    Knee extension    Ankle dorsiflexion     (Blank rows = not tested) (Key: WFL = within functional limits not formally assessed, * = concordant pain, s = stiffness/stretching sensation, NT = not tested)  Comments:    LUMBAR SPECIAL TESTS:   Slump test:   R: ***   L: ***   FUNCTIONAL TESTS:  5xSTS/30secSTS: *** TUG: ***   GAIT: Distance walked: within clinic Assistive device utilized: {Assistive devices:23999} Level of assistance: {Levels of assistance:24026} Comments: ***   TREATMENT:  [facility] Adult PT Treatment:                                                DATE: 12/10/23 Therapeutic Exercise: *** Manual Therapy: *** Neuromuscular re-ed: *** Therapeutic Activity: *** Modalities: *** Self Care: ***  PATIENT EDUCATION:  Education details: Pt education on PT impairments, prognosis, and POC. Informed consent. Rationale for interventions, safe/appropriate HEP performance Person educated: Patient Education method: Explanation, Demonstration, Tactile cues, Verbal cues Education comprehension: verbalized understanding, returned  demonstration, verbal cues required, tactile cues required, and needs further education    HOME EXERCISE PROGRAM: ***  ASSESSMENT:  CLINICAL IMPRESSION: Patient is a 73 y.o. woman who was seen today for physical therapy evaluation and treatment for back pain + RLE pain. ***    OBJECTIVE IMPAIRMENTS: {opptimpairments:25111}.   ACTIVITY LIMITATIONS: {activitylimitations:27494}  PARTICIPATION LIMITATIONS: {participationrestrictions:25113}  PERSONAL FACTORS: Age, Time since onset of injury/illness/exacerbation, and 3+ comorbidities: DVT, HTN, raynauds, AV fistula, HTN, lupus, palpitations, gout, ESRD on dialysis are also affecting patient's functional outcome.   REHAB POTENTIAL: Fair given chronicity and comorbidities  CLINICAL DECISION MAKING: {clinical decision making:25114}  EVALUATION COMPLEXITY: {Evaluation complexity:25115}   GOALS: Goals reviewed with patient? {yes/no:20286}  SHORT TERM GOALS: Target date: ***  Pt will demonstrate appropriate understanding and performance of initially prescribed HEP in order to facilitate improved independence with management of symptoms.  Baseline: HEP ***  Goal status: INITIAL   2. Pt will report at least 25% improvement in overall pain levels over past week in order to facilitate improved tolerance to typical daily activities.   Baseline: ***  Goal status: INITIAL    LONG TERM GOALS: Target date: *** Pt will improve at least 20% on ODI in order to demonstrate improved perception of functional status due to symptoms.  Baseline: *** Goal status: INITIAL  2.  Pt will demonstrate *** lumbar AROM in order to demonstrate improved tolerance to functional movement patterns.   Baseline: *** Goal status: INITIAL  3.  Pt will demonstrate hip MMT of *** in order to demonstrate improved strength for functional movements.  Baseline: *** Goal status: INITIAL  4. Pt will perform 5xSTS in <*** sec in order to demonstrate reduced fall risk and  improved functional independence. (MCID of 2.3sec)  Baseline: ***  Goal status: INITIAL   PLAN:  PT FREQUENCY: {rehab frequency:25116}  PT DURATION: {rehab duration:25117}  PLANNED INTERVENTIONS: {rehab planned interventions:25118::97110-Therapeutic exercises,97530- Therapeutic [phone]- Neuromuscular re-education,97535- Self Rjmz,02859- Manual therapy,Patient/Family education}.  PLAN FOR NEXT SESSION: Review/update HEP PRN. Work on [employer] exercises as appropriate with emphasis on ***. Symptom modification strategies as indicated/appropriate.    Alm DELENA Jenny PT, DPT 12/10/2023 9:29 AM

## 2023-12-11 NOTE — Therapy (Unsigned)
 OUTPATIENT PHYSICAL THERAPY THORACOLUMBAR EVALUATION  Referring diagnosis? Lumbar back pain with radiculopathy  Treatment diagnosis? (if different than referring diagnosis) see below What was this (referring dx) caused by? []  Surgery []  Fall [x]  Ongoing issue []  Arthritis []  Other: ____________  Laterality: [x]  Rt []  Lt []  Both  Check all possible CPT codes:  *CHOOSE 10 OR LESS*    See Planned Interventions listed in the Plan section of the Evaluation.   Patient Name: Joy Anderson MRN: [id number] DOB:[birth year], 73 y.o., female Today's Date: 12/15/2023  END OF SESSION:  PT End of Session - 12/15/23 1821     Visit Number 1    Number of Visits 17    Date for Recertification  02/09/24    Authorization Type [employer]    PT Start Time 0845    PT Stop Time 0928    PT Time Calculation (min) 43 min    Activity Tolerance Patient tolerated treatment well    Behavior During Therapy [facility] for tasks assessed/performed          Past Medical History:  Diagnosis Date   Antiphospholipid antibody with hypercoagulable state 08/27/2010   DVT (deep venous thrombosis) (HCC)    anti phospholipid antibody + -- Dr Zannie   Hypertension    OAB (overactive bladder)    off meds   Post-menopausal    Proteinuria    Dr Glean   Raynaud's disease    Past Surgical History:  Procedure Laterality Date   AV FISTULA PLACEMENT  01/06/2014   CATARACT EXTRACTION, BILATERAL  01/07/2015   HIP SURGERY Right 10/2018   left one done 10/2019   TOTAL ABDOMINAL HYSTERECTOMY  01/07/1999   for fibroids w/ 1 oophorectomy   Patient Active Problem List   Diagnosis Date Noted   Hemorrhoids 09/29/2020   Bone infarct of distal femur, left with osteoarthritis 06/20/2020   Elevated triglycerides with high cholesterol 04/13/2017   Adnexal cyst 01/17/2017   Osteoarthritis, hip, bilateral 10/31/2015   Arteriovenous fistula for hemodialysis in place, primary 04/30/2015   ESRD on dialysis (HCC)  03/27/2015   Uremia 10/12/2014   Glaucoma of both eyes 07/06/2014   Pain in joint of left hip 05/25/2012   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   Antiphospholipid antibody with hypercoagulable state 08/27/2010   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   Vitamin D  deficiency 08/08/2010   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 07/15/2010   Gout 04/17/2010   Exophthalmos 12/12/2009   ANEMIA, IRON DEFICIENCY 11/20/2009   FATTY LIVER DISEASE 10/26/2009   PULMONARY HYPERTENSION, MILD 04/14/2008   PALPITATIONS 03/22/2008   RAYNAUD'S DISEASE 02/01/2008   SLE (systemic lupus erythematosus related syndrome) (HCC) 12/06/2007   Essential hypertension, benign 11/30/2007   POLYARTHRITIS 11/30/2007    PCP: Dorothyann Byars, MD  REFERRING PROVIDER: Dorothyann Byars, MD  REFERRING DIAG:  Diagnosis  M54.16 (ICD-10-CM) - Lumbar back pain with radiculopathy affecting lower extremity  M54.31 (ICD-10-CM) - Right sided sciatica    Rationale for Evaluation and Treatment: Rehabilitation  THERAPY DIAG:  Other low back pain  ONSET DATE: 4 months ago  SUBJECTIVE:  SUBJECTIVE STATEMENT: Pt reports no acute onset. She reports waking up one morning 4 months ago, having severe low back pain that has now increased severity to the right side. She states she's been having mild low back pain for last 6-7 months that she disregarded, until approx 4 months ago, waking up to 10/10 pain. She describes her discomfort as soreness, opposed to pain. She reports she takes warfarin to address hx DVT. She wants to do PT to address her pain and be able to walk for longer distances without having to stop.    PERTINENT HISTORY:  Raynauds SLE Polyarthritis Hip OA AV Fistula placement   DVT in 2012  PAIN:  Are you having pain? Yes: NPRS scale: 6/10 currently  Pain location: bil lower back, radiating down right leg going into foot  Pain description: sore, sharp, pulsating lasting less than 1 min   Aggravating factors: bending over, picking up objects from floor  Relieving factors: sitting down, laying down on back or side lying, pain meds, heat   PRECAUTIONS: None  RED FLAGS: None   WEIGHT BEARING RESTRICTIONS: No  FALLS:  Has patient fallen in last 6 months? No  LIVING ENVIRONMENT: Lives with: lives with their family and lives with their daughter Lives in: House/apartment Stairs: Yes: External: 3 steps; on right going up Has following equipment at home: Single point cane uses only when going to dialysis 3x weekly   OCCUPATION: retired   PLOF: Independent  PATIENT GOALS: I just wanna be able to be normal, get rid of some of this pain, I want to walk longer than 20 min without pain  NEXT MD VISIT: Nothing planned   OBJECTIVE:  Note: Objective measures were completed at Evaluation unless otherwise noted.  DIAGNOSTIC FINDINGS:  IMPRESSION: Lower lumbar mild facet degenerative change.  PATIENT SURVEYS:  Modified Oswestry:  MODIFIED OSWESTRY DISABILITY SCALE  Date: 12/15/23 Score  Pain intensity 2 =  Pain medication provides me with complete relief from pain.  2. Personal care (washing, dressing, etc.) 0 =  I can take care of myself normally without causing increased pain.  3. Lifting 3 = Pain prevents me from lifting heavy weights, but I can manage light to medium weights if they are conveniently positioned  4. Walking 3 =  Pain prevents me from walking more than  mile.  5. Sitting 2 =  Pain prevents me from sitting more than 1 hour.  6. Standing 3 =  Pain prevents me from standing more than 1/2 hour.  7. Sleeping 0 = Pain does not prevent me from sleeping well.  8. Social Life 0 = My social life is normal and does not increase my pain.  9.  Traveling 1 =  I can travel anywhere, but it increases my pain.  10. Employment/ Homemaking 3 = Pain prevents me from doing anything but light duties.  Total 17/50   Interpretation of scores: Score Category Description  0-20% Minimal Disability The patient can cope with most living activities. Usually no treatment is indicated apart from advice on lifting, sitting and exercise  21-40% Moderate Disability The patient experiences more pain and difficulty with sitting, lifting and standing. Travel and social life are more difficult and they may be disabled from work. Personal care, sexual activity and sleeping are not grossly affected, and the patient can usually be managed by conservative means  41-60% Severe Disability Pain remains the main problem in this group, but activities of daily living are affected. These patients require  a detailed investigation  61-80% Crippled Back pain impinges on all aspects of the patient's life. Positive intervention is required  81-100% Bed-bound These patients are either bed-bound or exaggerating their symptoms  Bluford FORBES Zoe DELENA Karon DELENA, et al. Surgery versus conservative management of stable thoracolumbar fracture: the PRESTO feasibility RCT. [place] ([place]): [employer]; 2021 Nov. [facility] Technology Assessment, No. 25.62.) Appendix 3, Oswestry Disability Index category descriptors. Available from: Findjewelers.cz  Minimally Clinically Important Difference (MCID) = 12.8%  COGNITION: Overall cognitive status: Within functional limits for tasks assessed       POSTURE: rounded shoulders, forward head, increased thoracic kyphosis, and weight shift left  PALPATION: TTP at T7 and L1-L5  LUMBAR ROM:   AROM eval  Flexion WFL dis  Extension WFL pain   Right lateral flexion Limited 50 % discomfort   Left lateral flexion Limited 50%pain   Right rotation WFL  Left rotation WFL   (Blank rows = not tested)  LOWER  EXTREMITY ROM:     Active  Right eval Left eval  Hip flexion    Hip extension    Hip abduction    Hip adduction    Hip internal rotation    Hip external rotation    Knee flexion    Knee extension    Ankle dorsiflexion    Ankle plantarflexion    Ankle inversion    Ankle eversion     (Blank rows = not tested)  LOWER EXTREMITY MMT:    MMT Right eval Left eval  Hip flexion 4 4  Hip extension 3+ 3+  Hip abduction 4- 3  Hip adduction    Hip internal rotation 4 4  Hip external rotation 4+ 4+  Knee flexion 4- 4-  Knee extension 4- pain  4- pain  Ankle dorsiflexion    Ankle plantarflexion    Ankle inversion    Ankle eversion     (Blank rows = not tested)  LUMBAR SPECIAL TESTS:  Slump test: Negative and Single leg stance test: Positive  FUNCTIONAL TESTS:  5 times sit to stand: 18.30 sec   GAIT: Distance walked: 100 ft Assistive device utilized: None Level of assistance: Complete Independence Comments: lateral trunk lean to the left   TREATMENT DATE:  [facility] Adult PT Treatment:                                                DATE: 12/11/23 Therapeutic Exercise: See HEP                                                                                                                                   PATIENT EDUCATION:  Education details: HEP and POC Person educated: Patient Education method: Explanation, Demonstration, Tactile cues, Verbal cues, and Handouts Education comprehension: verbalized understanding, returned  demonstration, verbal cues required, tactile cues required, and needs further education  HOME EXERCISE PROGRAM: Access Code: [email or web address] URL: [email or web address] Date: 12/15/2023 Prepared by: Lavanda Cleverly  Exercises - Sidelying Transversus Abdominis Bracing  - 1 x daily - 7 x weekly - 3 sets - 10 reps - Sidelying Thoracic Rotation with Open Book  - 1 x daily - 7 x weekly - 3 sets - 10 reps - Seated Scapular Retraction  - 1 x daily -  7 x weekly - 3 sets - 10 reps  ASSESSMENT:  CLINICAL IMPRESSION: Patient is a 73 y.o. female who was seen today for physical therapy evaluation and treatment for lumbar back pain with right sided sciatica. Upon assessment she is negative for slump test, decreased LE strength, decreased cervical ROM with pain, and decreased functional capacity for everyday activities. Pt wants to be able to walk longer than 20 min without pain, and improve quality of gait to address lateral side flexion. Pt will benefit from skilled therapy to address the deficits below.   OBJECTIVE IMPAIRMENTS: Abnormal gait, decreased activity tolerance, decreased balance, decreased coordination, decreased endurance, decreased knowledge of condition, decreased mobility, difficulty walking, decreased ROM, decreased strength, hypomobility, increased fascial restrictions, increased muscle spasms, impaired flexibility, impaired UE functional use, improper body mechanics, postural dysfunction, and pain.   ACTIVITY LIMITATIONS: carrying, lifting, bending, standing, squatting, sleeping, stairs, transfers, and dressing  PARTICIPATION LIMITATIONS: meal prep, cleaning, laundry, driving, shopping, community activity, and occupation  PERSONAL FACTORS: Age, Fitness, Past/current experiences, Sex, Time since onset of injury/illness/exacerbation, and 3+ comorbidities: SLE and dialysis are also affecting patient's functional outcome.   REHAB POTENTIAL: Fair see above  CLINICAL DECISION MAKING: Evolving/moderate complexity  EVALUATION COMPLEXITY: Moderate   GOALS: Goals reviewed with patient? Yes  SHORT TERM GOALS: Target date: 01/15/24  Pt will be competent in initial HEP so she has increased independence for everyday activities Baseline: Goal status: INITIAL  2.  Pt will report 4/10 or less so she is able to complete household chores and all ADL's independently/safely.  Baseline: 6/10 Goal status: INITIAL  3.  Pt will demonstrate  proper form technique for dead lifting so she can pick objects off the floor pain free and safely.  Baseline: unable to without pain  Goal status: INITIAL    LONG TERM GOALS: Target date: 02/09/23  Pt will be competent in advanced HEP so she has increased independence for everyday activities Baseline:  Goal status: INITIAL  2.  Pt will score 13% lower on Modified Oswestry scale, demonstrating decreased disability and increased functional capacity for house hold chores/ADLs.  Baseline: 34% (17/50) Goal status: INITIAL  3.  Pt will report ability to walk 1/2 a mile without low back pain and without rest breaks Baseline: unable Goal status: INITIAL    PLAN:  PT FREQUENCY: 1-2x/week  PT DURATION: 8 weeks  PLANNED INTERVENTIONS: [id number]- PT Re-evaluation, 97110-Therapeutic exercises, 97530- Therapeutic activity, 97112- Neuromuscular re-education, 97535- Self Care, [id number]- Manual therapy, [id number]- Gait training, [phone]- Electrical stimulation (manual), [phone] (1-2 muscles), [id number] (3+ muscles)- Dry Needling, Patient/Family education, Balance training, Stair training, Joint mobilization, Spinal mobilization, Cryotherapy, and Moist heat.  PLAN FOR NEXT SESSION: core strengthening, postural strengthening, hip strengthening, thoracic/lumbar spinal mobility    Lavanda Cleverly, Student-PT 12/15/2023, 7:01 PM

## 2023-12-15 ENCOUNTER — Other Ambulatory Visit: Payer: Self-pay

## 2023-12-15 ENCOUNTER — Encounter: Payer: Self-pay | Admitting: Physical Therapy

## 2023-12-15 ENCOUNTER — Ambulatory Visit: Admitting: Physical Therapy

## 2023-12-15 DIAGNOSIS — M5431 Sciatica, right side: Secondary | ICD-10-CM | POA: Insufficient documentation

## 2023-12-15 DIAGNOSIS — M5416 Radiculopathy, lumbar region: Secondary | ICD-10-CM | POA: Insufficient documentation

## 2023-12-15 DIAGNOSIS — M5459 Other low back pain: Secondary | ICD-10-CM | POA: Diagnosis present

## 2023-12-22 ENCOUNTER — Encounter: Payer: Self-pay | Admitting: Physical Therapy

## 2023-12-22 ENCOUNTER — Ambulatory Visit: Admitting: Physical Therapy

## 2023-12-22 DIAGNOSIS — M5459 Other low back pain: Secondary | ICD-10-CM | POA: Diagnosis not present

## 2023-12-22 NOTE — Therapy (Signed)
 OUTPATIENT PHYSICAL THERAPY THORACOLUMBAR TREATMENT  Patient Name: Joy Anderson MRN: [id number] DOB:1950-10-14, 73 y.o., female Today's Date: 12/22/2023  END OF SESSION:  PT End of Session - 12/22/23 0928     Visit Number 2    Number of Visits 17    Date for Recertification  02/09/24    Authorization Type [employer]    Authorization - Visit Number 2    Progress Note Due on Visit 10    PT Start Time 0847    PT Stop Time 0928    PT Time Calculation (min) 41 min    Activity Tolerance Patient tolerated treatment well    Behavior During Therapy [facility] for tasks assessed/performed           Past Medical History:  Diagnosis Date   Antiphospholipid antibody with hypercoagulable state 08/27/2010   DVT (deep venous thrombosis) (HCC)    anti phospholipid antibody + -- Dr Zannie   Hypertension    OAB (overactive bladder)    off meds   Post-menopausal    Proteinuria    Dr Glean   Raynaud's disease    Past Surgical History:  Procedure Laterality Date   AV FISTULA PLACEMENT  01/06/2014   CATARACT EXTRACTION, BILATERAL  01/07/2015   HIP SURGERY Right 10/2018   left one done 10/2019   TOTAL ABDOMINAL HYSTERECTOMY  01/07/1999   for fibroids w/ 1 oophorectomy   Patient Active Problem List   Diagnosis Date Noted   Hemorrhoids 09/29/2020   Bone infarct of distal femur, left with osteoarthritis 06/20/2020   Elevated triglycerides with high cholesterol 04/13/2017   Adnexal cyst 01/17/2017   Osteoarthritis, hip, bilateral 10/31/2015   Arteriovenous fistula for hemodialysis in place, primary 04/30/2015   ESRD on dialysis (HCC) 03/27/2015   Uremia 10/12/2014   Glaucoma of both eyes 07/06/2014   Pain in joint of left hip 05/25/2012   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   Antiphospholipid antibody with hypercoagulable state 08/27/2010   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   Vitamin D  deficiency 08/08/2010   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 07/15/2010   Gout 04/17/2010   Exophthalmos 12/12/2009   ANEMIA, IRON DEFICIENCY 11/20/2009   FATTY LIVER DISEASE 10/26/2009   PULMONARY HYPERTENSION, MILD 04/14/2008   PALPITATIONS 03/22/2008   RAYNAUD'S DISEASE 02/01/2008   SLE (systemic lupus erythematosus related syndrome) (HCC) 12/06/2007   Essential hypertension, benign 11/30/2007   POLYARTHRITIS 11/30/2007    PCP: Dorothyann Byars, MD  REFERRING PROVIDER: Dorothyann Byars, MD  REFERRING DIAG:  Diagnosis  M54.16 (ICD-10-CM) - Lumbar back pain with radiculopathy affecting lower extremity  M54.31 (ICD-10-CM) - Right sided sciatica    Rationale for Evaluation and Treatment: Rehabilitation  THERAPY DIAG:  Other low back pain  ONSET DATE: 4 months ago  SUBJECTIVE:  SUBJECTIVE STATEMENT: Pt with no new complaints   PERTINENT HISTORY:  Raynauds SLE Polyarthritis Hip OA AV Fistula placement  DVT in 2012 Dialysis MWF  Pt reports no acute onset. She reports waking up one morning 4 months ago, having severe low back pain that has now increased severity to the right side. She states she's been having mild low back pain for last 6-7 months that she disregarded, until approx 4 months ago, waking up to 10/10 pain. She describes her discomfort as soreness, opposed to pain. She reports she takes warfarin to address hx DVT. She wants to do PT to address her pain and be able to walk for longer distances without having to stop.   PAIN:  Are you having pain? Yes: NPRS scale: 6/10 currently  Pain location: bil lower back, radiating down right leg going into foot  Pain description: sore, sharp, pulsating lasting less than 1 min   Aggravating factors: bending over, picking up objects from floor  Relieving factors:  sitting down, laying down on back or side lying, pain meds, heat   PRECAUTIONS: Fistula Rt UE  RED FLAGS: None   WEIGHT BEARING RESTRICTIONS: No  FALLS:  Has patient fallen in last 6 months? No  LIVING ENVIRONMENT: Lives with: lives with their family and lives with their daughter Lives in: House/apartment Stairs: Yes: External: 3 steps; on right going up Has following equipment at home: Single point cane uses only when going to dialysis 3x weekly   OCCUPATION: retired   PLOF: Independent  PATIENT GOALS: I just wanna be able to be normal, get rid of some of this pain, I want to walk longer than 20 min without pain  NEXT MD VISIT: Nothing planned   OBJECTIVE:  Note: Objective measures were completed at Evaluation unless otherwise noted.  DIAGNOSTIC FINDINGS:  IMPRESSION: Lower lumbar mild facet degenerative change.  PATIENT SURVEYS:  Modified Oswestry:  MODIFIED OSWESTRY DISABILITY SCALE  Date: 12/15/23 Score  Pain intensity 2 =  Pain medication provides me with complete relief from pain.  2. Personal care (washing, dressing, etc.) 0 =  I can take care of myself normally without causing increased pain.  3. Lifting 3 = Pain prevents me from lifting heavy weights, but I can manage light to medium weights if they are conveniently positioned  4. Walking 3 =  Pain prevents me from walking more than  mile.  5. Sitting 2 =  Pain prevents me from sitting more than 1 hour.  6. Standing 3 =  Pain prevents me from standing more than 1/2 hour.  7. Sleeping 0 = Pain does not prevent me from sleeping well.  8. Social Life 0 = My social life is normal and does not increase my pain.  9. Traveling 1 =  I can travel anywhere, but it increases my pain.  10. Employment/ Homemaking 3 = Pain prevents me from doing anything but light duties.  Total 17/50   Interpretation of scores: Score Category Description  0-20% Minimal Disability The patient can cope with most living activities.  Usually no treatment is indicated apart from advice on lifting, sitting and exercise  21-40% Moderate Disability The patient experiences more pain and difficulty with sitting, lifting and standing. Travel and social life are more difficult and they may be disabled from work. Personal care, sexual activity and sleeping are not grossly affected, and the patient can usually be managed by conservative means  41-60% Severe Disability Pain remains the main problem in this group,  but activities of daily living are affected. These patients require a detailed investigation  61-80% Crippled Back pain impinges on all aspects of the patients life. Positive intervention is required  81-100% Bed-bound These patients are either bed-bound or exaggerating their symptoms  Bluford FORBES Zoe DELENA Karon DELENA, et al. Surgery versus conservative management of stable thoracolumbar fracture: the PRESTO feasibility RCT. [place] ([place]): [employer]; 2021 Nov. [facility] Technology Assessment, No. 25.62.) Appendix 3, Oswestry Disability Index category descriptors. Available from: Findjewelers.cz  Minimally Clinically Important Difference (MCID) = 12.8%  COGNITION: Overall cognitive status: Within functional limits for tasks assessed       POSTURE: rounded shoulders, forward head, increased thoracic kyphosis, and weight shift left  PALPATION: TTP at T7 and L1-L5  LUMBAR ROM:   AROM eval  Flexion WFL dis  Extension WFL pain   Right lateral flexion Limited 50 % discomfort   Left lateral flexion Limited 50%pain   Right rotation WFL  Left rotation WFL   (Blank rows = not tested)  LOWER EXTREMITY ROM:     Active  Right eval Left eval  Hip flexion    Hip extension    Hip abduction    Hip adduction    Hip internal rotation    Hip external rotation    Knee flexion    Knee extension    Ankle dorsiflexion    Ankle plantarflexion    Ankle inversion    Ankle eversion      (Blank rows = not tested)  LOWER EXTREMITY MMT:    MMT Right eval Left eval  Hip flexion 4 4  Hip extension 3+ 3+  Hip abduction 4- 3  Hip adduction    Hip internal rotation 4 4  Hip external rotation 4+ 4+  Knee flexion 4- 4-  Knee extension 4- pain  4- pain  Ankle dorsiflexion    Ankle plantarflexion    Ankle inversion    Ankle eversion     (Blank rows = not tested)  LUMBAR SPECIAL TESTS:  Slump test: Negative and Single leg stance test: Positive  FUNCTIONAL TESTS:  5 times sit to stand: 18.30 sec   GAIT: Distance walked: 100 ft Assistive device utilized: None Level of assistance: Complete Independence Comments: lateral trunk lean to the left   TREATMENT DATE:  [facility] Adult PT Treatment:                                                DATE: 12/22/23 Therapeutic Exercise: LTR x 2 min for warm up Bridge x 10 Seated figure 4 stretch Seated HS stretch  Neuromuscular re-ed: S/L Open book x 10 bilat S/L Clam x 10 bilat Therapeutic Activity: Sit <> stand holding 5# KB 2 x 5 Standing row green TB 2 x 10 Shoulder ext green TB x 12 Green TB pull down with march x 10 Standing hip abd red TB 2 x 10 Standing hip ext 2 x 10 Heel raise x 15   [facility] Adult PT Treatment:                                                DATE: 12/11/23 Therapeutic Exercise: See HEP  PATIENT EDUCATION:  Education details: HEP and POC Person educated: Patient Education method: Explanation, Demonstration, Tactile cues, Verbal cues, and Handouts Education comprehension: verbalized understanding, returned demonstration, verbal cues required, tactile cues required, and needs further education  HOME EXERCISE PROGRAM: Access Code: [email or web address] URL: [email or web address] Date: 12/22/2023 Prepared by: Darice Conine  Exercises - Seated Scapular Retraction   - 1 x daily - 7 x weekly - 3 sets - 10 reps - Sit to Stand Without Arm Support  - 1 x daily - 7 x weekly - 3 sets - 5 reps - Heel Raises with Counter Support  - 1 x daily - 7 x weekly - 2 sets - 10 reps  ASSESSMENT:  CLINICAL IMPRESSION: Pt with some discomfort sidelying so exercises performed in sitting and standing. She requires frequent seated breaks but has good tolerance to standing core and hip strengthening. HEP updated  OBJECTIVE IMPAIRMENTS: Abnormal gait, decreased activity tolerance, decreased balance, decreased coordination, decreased endurance, decreased knowledge of condition, decreased mobility, difficulty walking, decreased ROM, decreased strength, hypomobility, increased fascial restrictions, increased muscle spasms, impaired flexibility, impaired UE functional use, improper body mechanics, postural dysfunction, and pain.     GOALS: Goals reviewed with patient? Yes  SHORT TERM GOALS: Target date: 01/15/24  Pt will be competent in initial HEP so she has increased independence for everyday activities Baseline: Goal status: INITIAL  2.  Pt will report 4/10 or less so she is able to complete household chores and all ADL's independently/safely.  Baseline: 6/10 Goal status: INITIAL  3.  Pt will demonstrate proper form technique for dead lifting so she can pick objects off the floor pain free and safely.  Baseline: unable to without pain  Goal status: INITIAL    LONG TERM GOALS: Target date: 02/09/23  Pt will be competent in advanced HEP so she has increased independence for everyday activities Baseline:  Goal status: INITIAL  2.  Pt will score 13% lower on Modified Oswestry scale, demonstrating decreased disability and increased functional capacity for house hold chores/ADLs.  Baseline: 34% (17/50) Goal status: INITIAL  3.  Pt will report ability to walk 1/2 a mile without low back pain and without rest breaks Baseline: unable Goal status: INITIAL    PLAN:  PT  FREQUENCY: 1-2x/week  PT DURATION: 8 weeks  PLANNED INTERVENTIONS: [id number]- PT Re-evaluation, 97110-Therapeutic exercises, 97530- Therapeutic activity, 97112- Neuromuscular re-education, 97535- Self Care, [id number]- Manual therapy, [id number]- Gait training, [phone]- Electrical stimulation (manual), [phone] (1-2 muscles), [id number] (3+ muscles)- Dry Needling, Patient/Family education, Balance training, Stair training, Joint mobilization, Spinal mobilization, Cryotherapy, and Moist heat.  PLAN FOR NEXT SESSION: core strengthening, postural strengthening, hip strengthening, thoracic/lumbar spinal mobility    [place], PT 12/22/2023, 9:29 AM

## 2023-12-29 ENCOUNTER — Ambulatory Visit: Admitting: Physical Therapy

## 2023-12-30 ENCOUNTER — Ambulatory Visit

## 2023-12-30 DIAGNOSIS — M5459 Other low back pain: Secondary | ICD-10-CM

## 2023-12-30 NOTE — Therapy (Signed)
 " OUTPATIENT PHYSICAL THERAPY THORACOLUMBAR TREATMENT  Patient Name: Joy Anderson MRN: [id number] DOB:04-04-1950, 74 y.o., female Today's Date: 12/30/2023  END OF SESSION:  PT End of Session - 12/30/23 0936     Visit Number 3    Number of Visits 17    Date for Recertification  02/09/24    Authorization Type [employer]    Authorization Time Period 8 visits approved for PT 12/15/2023-03/14/2024    Authorization - Visit Number 3    Authorization - Number of Visits 8    PT Start Time 0936    PT Stop Time 1019    PT Time Calculation (min) 43 min    Activity Tolerance Patient tolerated treatment well    Behavior During Therapy [facility] for tasks assessed/performed           Past Medical History:  Diagnosis Date   Antiphospholipid antibody with hypercoagulable state 08/27/2010   DVT (deep venous thrombosis) (HCC)    anti phospholipid antibody + -- Dr Zannie   Hypertension    OAB (overactive bladder)    off meds   Post-menopausal    Proteinuria    Dr Glean   Raynaud's disease    Past Surgical History:  Procedure Laterality Date   AV FISTULA PLACEMENT  01/06/2014   CATARACT EXTRACTION, BILATERAL  01/07/2015   HIP SURGERY Right 10/2018   left one done 10/2019   TOTAL ABDOMINAL HYSTERECTOMY  01/07/1999   for fibroids w/ 1 oophorectomy   Patient Active Problem List   Diagnosis Date Noted   Hemorrhoids 09/29/2020   Bone infarct of distal femur, left with osteoarthritis 06/20/2020   Elevated triglycerides with high cholesterol 04/13/2017   Adnexal cyst 01/17/2017   Osteoarthritis, hip, bilateral 10/31/2015   Arteriovenous fistula for hemodialysis in place, primary 04/30/2015   ESRD on dialysis (HCC) 03/27/2015   Uremia 10/12/2014   Glaucoma of both eyes 07/06/2014   Pain in joint of left hip 05/25/2012   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   Antiphospholipid antibody with hypercoagulable state 08/27/2010   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    Vitamin D  deficiency 08/08/2010   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 07/15/2010   Gout 04/17/2010   Exophthalmos 12/12/2009   ANEMIA, IRON DEFICIENCY 11/20/2009   FATTY LIVER DISEASE 10/26/2009   PULMONARY HYPERTENSION, MILD 04/14/2008   PALPITATIONS 03/22/2008   RAYNAUD'S DISEASE 02/01/2008   SLE (systemic lupus erythematosus related syndrome) (HCC) 12/06/2007   Essential hypertension, benign 11/30/2007   POLYARTHRITIS 11/30/2007    PCP: Dorothyann Byars, MD  REFERRING PROVIDER: Dorothyann Byars, MD  REFERRING DIAG:  Diagnosis  M54.16 (ICD-10-CM) - Lumbar back pain with radiculopathy affecting lower extremity  M54.31 (ICD-10-CM) - Right sided sciatica    Rationale for Evaluation and Treatment: Rehabilitation  THERAPY DIAG:  Other low back pain  ONSET DATE: 4 months ago  SUBJECTIVE:  SUBJECTIVE STATEMENT: Patient reports her back is feeling sore but is not as sore as when I first came in.    PERTINENT HISTORY:  Raynauds SLE Polyarthritis Hip OA AV Fistula placement  DVT in 2012 Dialysis MWF  Pt reports no acute onset. She reports waking up one morning 4 months ago, having severe low back pain that has now increased severity to the right side. She states she's been having mild low back pain for last 6-7 months that she disregarded, until approx 4 months ago, waking up to 10/10 pain. She describes her discomfort as soreness, opposed to pain. She reports she takes warfarin to address hx DVT. She wants to do PT to address her pain and be able to walk for longer distances without having to stop.   PAIN:  Are you having pain? Yes: NPRS scale: 5/10 currently  Pain location: bil lower back, radiating down right leg going into foot  Pain description:  sore, sharp, pulsating lasting less than 1 min   Aggravating factors: bending over, picking up objects from floor  Relieving factors: sitting down, laying down on back or side lying, pain meds, heat   PRECAUTIONS: Fistula Rt UE  RED FLAGS: None   WEIGHT BEARING RESTRICTIONS: No  FALLS:  Has patient fallen in last 6 months? No  LIVING ENVIRONMENT: Lives with: lives with their family and lives with their daughter Lives in: House/apartment Stairs: Yes: External: 3 steps; on right going up Has following equipment at home: Single point cane uses only when going to dialysis 3x weekly   OCCUPATION: retired   PLOF: Independent  PATIENT GOALS: I just wanna be able to be normal, get rid of some of this pain, I want to walk longer than 20 min without pain  NEXT MD VISIT: Nothing planned   OBJECTIVE:  Note: Objective measures were completed at Evaluation unless otherwise noted.  DIAGNOSTIC FINDINGS:  IMPRESSION: Lower lumbar mild facet degenerative change.  PATIENT SURVEYS:  Modified Oswestry:  MODIFIED OSWESTRY DISABILITY SCALE  Date: 12/15/23 Score  Pain intensity 2 =  Pain medication provides me with complete relief from pain.  2. Personal care (washing, dressing, etc.) 0 =  I can take care of myself normally without causing increased pain.  3. Lifting 3 = Pain prevents me from lifting heavy weights, but I can manage light to medium weights if they are conveniently positioned  4. Walking 3 =  Pain prevents me from walking more than  mile.  5. Sitting 2 =  Pain prevents me from sitting more than 1 hour.  6. Standing 3 =  Pain prevents me from standing more than 1/2 hour.  7. Sleeping 0 = Pain does not prevent me from sleeping well.  8. Social Life 0 = My social life is normal and does not increase my pain.  9. Traveling 1 =  I can travel anywhere, but it increases my pain.  10. Employment/ Homemaking 3 = Pain prevents me from doing anything but light duties.  Total 17/50    Interpretation of scores: Score Category Description  0-20% Minimal Disability The patient can cope with most living activities. Usually no treatment is indicated apart from advice on lifting, sitting and exercise  21-40% Moderate Disability The patient experiences more pain and difficulty with sitting, lifting and standing. Travel and social life are more difficult and they may be disabled from work. Personal care, sexual activity and sleeping are not grossly affected, and the patient can usually be managed by  conservative means  41-60% Severe Disability Pain remains the main problem in this group, but activities of daily living are affected. These patients require a detailed investigation  61-80% Crippled Back pain impinges on all aspects of the patients life. Positive intervention is required  81-100% Bed-bound These patients are either bed-bound or exaggerating their symptoms  Bluford FORBES Zoe DELENA Karon DELENA, et al. Surgery versus conservative management of stable thoracolumbar fracture: the PRESTO feasibility RCT. [place] ([place]): [employer]; 2021 Nov. [facility] Technology Assessment, No. 25.62.) Appendix 3, Oswestry Disability Index category descriptors. Available from: [email or web address].cz  Minimally Clinically Important Difference (MCID) = 12.8%  COGNITION: Overall cognitive status: Within functional limits for tasks assessed       POSTURE: rounded shoulders, forward head, increased thoracic kyphosis, and weight shift left  PALPATION: TTP at T7 and L1-L5  LUMBAR ROM:   AROM eval  Flexion WFL dis  Extension WFL pain   Right lateral flexion Limited 50 % discomfort   Left lateral flexion Limited 50%pain   Right rotation WFL  Left rotation WFL   (Blank rows = not tested)  LOWER EXTREMITY ROM:     Active  Right eval Left eval  Hip flexion    Hip extension    Hip abduction    Hip adduction    Hip internal rotation    Hip external  rotation    Knee flexion    Knee extension    Ankle dorsiflexion    Ankle plantarflexion    Ankle inversion    Ankle eversion     (Blank rows = not tested)  LOWER EXTREMITY MMT:    MMT Right eval Left eval  Hip flexion 4 4  Hip extension 3+ 3+  Hip abduction 4- 3  Hip adduction    Hip internal rotation 4 4  Hip external rotation 4+ 4+  Knee flexion 4- 4-  Knee extension 4- pain  4- pain  Ankle dorsiflexion    Ankle plantarflexion    Ankle inversion    Ankle eversion     (Blank rows = not tested)  LUMBAR SPECIAL TESTS:  Slump test: Negative and Single leg stance test: Positive  FUNCTIONAL TESTS:  5 times sit to stand: 18.30 sec   GAIT: Distance walked: 100 ft Assistive device utilized: None Level of assistance: Complete Independence Comments: lateral trunk lean to the left   TREATMENT DATE:  [facility] Adult PT Treatment:                                                DATE: 12/30/2023 Therapeutic Exercise: Obturator & gluteal self-massage with tennis ball Neuromuscular re-ed: Hooklying hip add isometric ball squeeze --> aggravated groin on Lt Small range marching with TA activation Small range SLR + TA activation --> discontinued d/t lumbar compensation Standing hip abd Standing hip extension (bent over, toe tap position) Modified single leg dead lift Single leg balance Therapeutic Activity: Prone prop on elbows 2 x 1 min Prone press up to elbows x 8 Supine diaphragmatic breathing Supported adductor butterfly stretch with bolster support + diaphragmatic breathing Seated trunk flexion --> reaching down to floor   [facility] Adult PT Treatment:  DATE: 12/22/23 Therapeutic Exercise: LTR x 2 min for warm up Bridge x 10 Seated figure 4 stretch Seated HS stretch  Neuromuscular re-ed: S/L Open book x 10 bilat S/L Clam x 10 bilat Therapeutic Activity: Sit <> stand holding 5# KB 2 x 5 Standing row green TB 2 x  10 Shoulder ext green TB x 12 Green TB pull down with march x 10 Standing hip abd red TB 2 x 10 Standing hip ext 2 x 10 Heel raise x 15                                                                                                                         PATIENT EDUCATION:  Education details: Updated HEP  Person educated: Patient Education method: Explanation, Demonstration, Tactile cues, Verbal cues, and Handouts Education comprehension: verbalized understanding, returned demonstration, verbal cues required, tactile cues required, and needs further education  HOME EXERCISE PROGRAM: Access Code: [email or web address] URL: [email or web address] Date: 12/30/2023 Prepared by: Lamarr Price  Exercises - Seated Scapular Retraction  - 1 x daily - 7 x weekly - 3 sets - 10 reps - Sit to Stand Without Arm Support  - 1 x daily - 7 x weekly - 3 sets - 5 reps - Heel Raises with Counter Support  - 1 x daily - 7 x weekly - 2 sets - 10 reps - Supported Teacher, Music with Pelvic Floor Relaxation  - 1 x daily - 7 x weekly - 3 sets - 10 reps - 30 sec - 1 min hold - Seated Diaphragmatic Breathing  - 1 x daily - 7 x weekly - 3 sets - 10 reps - Standing Lumbar Extension with Counter  - 2 x daily - 7 x weekly - 1 sets - 3-5 reps - 10-20 sec hold - Standing Hip Abduction with Counter Support  - 2 x daily - 7 x weekly - 2 sets - 10 reps - Standing Hip Extension with Counter Support  - 2 x daily - 7 x weekly - 2 sets - 10 reps - Single Leg Stance  - 2 x daily - 7 x weekly - 1 sets - 3 reps - 20 -30 sec hold  ASSESSMENT:  CLINICAL IMPRESSION: Poor transversus abdominis activation noted with core stabilizing exercises and tendency towards lumbar compensation with small leg lift variations. Tenderness and trigger points with palpitation in gluteals (Rt>Lt); recommended patient use tennis ball at home for self-massage to address tightness/tension. Progressed standing exercises with focus on glute  strengthening; patient reported decreased symptoms by end of session.   OBJECTIVE IMPAIRMENTS: Abnormal gait, decreased activity tolerance, decreased balance, decreased coordination, decreased endurance, decreased knowledge of condition, decreased mobility, difficulty walking, decreased ROM, decreased strength, hypomobility, increased fascial restrictions, increased muscle spasms, impaired flexibility, impaired UE functional use, improper body mechanics, postural dysfunction, and pain.     GOALS: Goals reviewed with patient? Yes  SHORT TERM GOALS: Target date: 01/15/24  Pt will be competent in initial HEP so she  has increased independence for everyday activities Baseline: Goal status: INITIAL  2.  Pt will report 4/10 or less so she is able to complete household chores and all ADL's independently/safely.  Baseline: 6/10 Goal status: INITIAL  3.  Pt will demonstrate proper form technique for dead lifting so she can pick objects off the floor pain free and safely.  Baseline: unable to without pain  Goal status: INITIAL    LONG TERM GOALS: Target date: 02/09/23  Pt will be competent in advanced HEP so she has increased independence for everyday activities Baseline:  Goal status: INITIAL  2.  Pt will score 13% lower on Modified Oswestry scale, demonstrating decreased disability and increased functional capacity for house hold chores/ADLs.  Baseline: 34% (17/50) Goal status: INITIAL  3.  Pt will report ability to walk 1/2 a mile without low back pain and without rest breaks Baseline: unable Goal status: INITIAL   PLAN:  PT FREQUENCY: 1-2x/week  PT DURATION: 8 weeks  PLANNED INTERVENTIONS: 97164- PT Re-evaluation, 97110-Therapeutic exercises, 97530- Therapeutic activity, 97112- Neuromuscular re-education, 97535- Self Care, [id number]- Manual therapy, [id number]- Gait training, [phone]- Electrical stimulation (manual), [phone] (1-2 muscles), [id number] (3+ muscles)- Dry Needling, Patient/Family  education, Balance training, Stair training, Joint mobilization, Spinal mobilization, Cryotherapy, and Moist heat.  PLAN FOR NEXT SESSION: core strengthening, postural strengthening, hip strengthening, thoracic/lumbar spinal mobility    Lamarr GORMAN Price, PTA 12/30/2023, 10:20 AM  "

## 2024-01-05 ENCOUNTER — Ambulatory Visit

## 2024-01-06 ENCOUNTER — Ambulatory Visit: Admitting: Physical Therapy

## 2024-01-06 ENCOUNTER — Encounter: Payer: Self-pay | Admitting: Physical Therapy

## 2024-01-06 DIAGNOSIS — M5459 Other low back pain: Secondary | ICD-10-CM

## 2024-01-06 NOTE — Therapy (Signed)
 " OUTPATIENT PHYSICAL THERAPY THORACOLUMBAR TREATMENT  Patient Name: Joy Anderson MRN: [id number] DOB:Feb 17, 1950, 73 y.o., female Today's Date: 01/06/2024  END OF SESSION:  PT End of Session - 01/06/24 1146     Visit Number 4    Number of Visits 17    Date for Recertification  02/09/24    Authorization Type [employer]    Authorization Time Period 8 visits approved for PT 12/15/2023-03/14/2024    Authorization - Visit Number 4    Authorization - Number of Visits 8    PT Start Time 1102    PT Stop Time 1141    PT Time Calculation (min) 39 min    Activity Tolerance Patient tolerated treatment well    Behavior During Therapy [facility] for tasks assessed/performed            Past Medical History:  Diagnosis Date   Antiphospholipid antibody with hypercoagulable state 08/27/2010   DVT (deep venous thrombosis) (HCC)    anti phospholipid antibody + -- Dr Zannie   Hypertension    OAB (overactive bladder)    off meds   Post-menopausal    Proteinuria    Dr Glean   Raynaud's disease    Past Surgical History:  Procedure Laterality Date   AV FISTULA PLACEMENT  01/06/2014   CATARACT EXTRACTION, BILATERAL  01/07/2015   HIP SURGERY Right 10/2018   left one done 10/2019   TOTAL ABDOMINAL HYSTERECTOMY  01/07/1999   for fibroids w/ 1 oophorectomy   Patient Active Problem List   Diagnosis Date Noted   Hemorrhoids 09/29/2020   Bone infarct of distal femur, left with osteoarthritis 06/20/2020   Elevated triglycerides with high cholesterol 04/13/2017   Adnexal cyst 01/17/2017   Osteoarthritis, hip, bilateral 10/31/2015   Arteriovenous fistula for hemodialysis in place, primary 04/30/2015   ESRD on dialysis (HCC) 03/27/2015   Uremia 10/12/2014   Glaucoma of both eyes 07/06/2014   Pain in joint of left hip 05/25/2012   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   Antiphospholipid antibody with hypercoagulable state 08/27/2010   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    Vitamin D  deficiency 08/08/2010   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 07/15/2010   Gout 04/17/2010   Exophthalmos 12/12/2009   ANEMIA, IRON DEFICIENCY 11/20/2009   FATTY LIVER DISEASE 10/26/2009   PULMONARY HYPERTENSION, MILD 04/14/2008   PALPITATIONS 03/22/2008   RAYNAUD'S DISEASE 02/01/2008   SLE (systemic lupus erythematosus related syndrome) (HCC) 12/06/2007   Essential hypertension, benign 11/30/2007   POLYARTHRITIS 11/30/2007    PCP: Dorothyann Byars, MD  REFERRING PROVIDER: Dorothyann Byars, MD  REFERRING DIAG:  Diagnosis  M54.16 (ICD-10-CM) - Lumbar back pain with radiculopathy affecting lower extremity  M54.31 (ICD-10-CM) - Right sided sciatica    Rationale for Evaluation and Treatment: Rehabilitation  THERAPY DIAG:  Other low back pain  ONSET DATE: 4 months ago  SUBJECTIVE:  SUBJECTIVE STATEMENT: Pt states she feels better but still has pain in certain positions   PERTINENT HISTORY:  Raynauds SLE Polyarthritis Hip OA AV Fistula placement  DVT in 2012 Dialysis MWF  Pt reports no acute onset. She reports waking up one morning 4 months ago, having severe low back pain that has now increased severity to the right side. She states she's been having mild low back pain for last 6-7 months that she disregarded, until approx 4 months ago, waking up to 10/10 pain. She describes her discomfort as soreness, opposed to pain. She reports she takes warfarin to address hx DVT. She wants to do PT to address her pain and be able to walk for longer distances without having to stop.   PAIN:  Are you having pain? Yes: NPRS scale: 3/10 currently  Pain location: bil lower back, radiating down right leg going into foot  Pain description: sore, sharp,  pulsating lasting less than 1 min   Aggravating factors: bending over, picking up objects from floor  Relieving factors: sitting down, laying down on back or side lying, pain meds, heat   PRECAUTIONS: Fistula Rt UE  RED FLAGS: None   WEIGHT BEARING RESTRICTIONS: No  FALLS:  Has patient fallen in last 6 months? No  LIVING ENVIRONMENT: Lives with: lives with their family and lives with their daughter Lives in: House/apartment Stairs: Yes: External: 3 steps; on right going up Has following equipment at home: Single point cane uses only when going to dialysis 3x weekly   OCCUPATION: retired   PLOF: Independent  PATIENT GOALS: I just wanna be able to be normal, get rid of some of this pain, I want to walk longer than 20 min without pain  NEXT MD VISIT: Nothing planned   OBJECTIVE:  Note: Objective measures were completed at Evaluation unless otherwise noted.  DIAGNOSTIC FINDINGS:  IMPRESSION: Lower lumbar mild facet degenerative change.  PATIENT SURVEYS:  Modified Oswestry:  MODIFIED OSWESTRY DISABILITY SCALE  Date: 12/15/23 Score  Pain intensity 2 =  Pain medication provides me with complete relief from pain.  2. Personal care (washing, dressing, etc.) 0 =  I can take care of myself normally without causing increased pain.  3. Lifting 3 = Pain prevents me from lifting heavy weights, but I can manage light to medium weights if they are conveniently positioned  4. Walking 3 =  Pain prevents me from walking more than  mile.  5. Sitting 2 =  Pain prevents me from sitting more than 1 hour.  6. Standing 3 =  Pain prevents me from standing more than 1/2 hour.  7. Sleeping 0 = Pain does not prevent me from sleeping well.  8. Social Life 0 = My social life is normal and does not increase my pain.  9. Traveling 1 =  I can travel anywhere, but it increases my pain.  10. Employment/ Homemaking 3 = Pain prevents me from doing anything but light duties.  Total 17/50    Interpretation of scores: Score Category Description  0-20% Minimal Disability The patient can cope with most living activities. Usually no treatment is indicated apart from advice on lifting, sitting and exercise  21-40% Moderate Disability The patient experiences more pain and difficulty with sitting, lifting and standing. Travel and social life are more difficult and they may be disabled from work. Personal care, sexual activity and sleeping are not grossly affected, and the patient can usually be managed by conservative means  41-60% Severe Disability Pain  remains the main problem in this group, but activities of daily living are affected. These patients require a detailed investigation  61-80% Crippled Back pain impinges on all aspects of the patients life. Positive intervention is required  81-100% Bed-bound These patients are either bed-bound or exaggerating their symptoms  Bluford FORBES Zoe DELENA Karon DELENA, et al. Surgery versus conservative management of stable thoracolumbar fracture: the PRESTO feasibility RCT. [place] ([place]): [employer]; 2021 Nov. [facility] Technology Assessment, No. 25.62.) Appendix 3, Oswestry Disability Index category descriptors. Available from: Findjewelers.cz  Minimally Clinically Important Difference (MCID) = 12.8%  COGNITION: Overall cognitive status: Within functional limits for tasks assessed       POSTURE: rounded shoulders, forward head, increased thoracic kyphosis, and weight shift left  PALPATION: TTP at T7 and L1-L5  LUMBAR ROM:   AROM eval  Flexion WFL dis  Extension WFL pain   Right lateral flexion Limited 50 % discomfort   Left lateral flexion Limited 50%pain   Right rotation WFL  Left rotation WFL   (Blank rows = not tested)  LOWER EXTREMITY ROM:     Active  Right eval Left eval  Hip flexion    Hip extension    Hip abduction    Hip adduction    Hip internal rotation    Hip external  rotation    Knee flexion    Knee extension    Ankle dorsiflexion    Ankle plantarflexion    Ankle inversion    Ankle eversion     (Blank rows = not tested)  LOWER EXTREMITY MMT:    MMT Right eval Left eval  Hip flexion 4 4  Hip extension 3+ 3+  Hip abduction 4- 3  Hip adduction    Hip internal rotation 4 4  Hip external rotation 4+ 4+  Knee flexion 4- 4-  Knee extension 4- pain  4- pain  Ankle dorsiflexion    Ankle plantarflexion    Ankle inversion    Ankle eversion     (Blank rows = not tested)  LUMBAR SPECIAL TESTS:  Slump test: Negative and Single leg stance test: Positive  FUNCTIONAL TESTS:  5 times sit to stand: 18.30 sec   GAIT: Distance walked: 100 ft Assistive device utilized: None Level of assistance: Complete Independence Comments: lateral trunk lean to the left   TREATMENT DATE:  [facility] Adult PT Treatment:                                                DATE: 01/06/24 Therapeutic Exercise: Seated forward trunk flexion Seated pelvic tilts Seated piriformis stretch 2 x 20 sec  Neuromuscular re-ed: Seated add squeeze with LAQ x 10 bilat Green TB pull down with march x 10 Therapeutic Activity: LTR x 10 Figure 4 LTR x 10 bilat Standing bent knee hip ext x 10 bilat Standing Hip abd 2 x 10 bilat Modified SL deadlift Resisted walking green TB: fwd x 10, bkwd x 10   [facility] Adult PT Treatment:                                                DATE: 12/30/2023 Therapeutic Exercise: Obturator & gluteal self-massage with tennis ball Neuromuscular re-ed: Hooklying hip add  isometric ball squeeze --> aggravated groin on Lt Small range marching with TA activation Small range SLR + TA activation --> discontinued d/t lumbar compensation Standing hip abd Standing hip extension (bent over, toe tap position) Modified single leg dead lift Single leg balance Therapeutic Activity: Prone prop on elbows 2 x 1 min Prone press up to elbows x 8 Supine diaphragmatic  breathing Supported adductor butterfly stretch with bolster support + diaphragmatic breathing Seated trunk flexion --> reaching down to floor   [facility] Adult PT Treatment:                                                DATE: 12/22/23 Therapeutic Exercise: LTR x 2 min for warm up Bridge x 10 Seated figure 4 stretch Seated HS stretch  Neuromuscular re-ed: S/L Open book x 10 bilat S/L Clam x 10 bilat Therapeutic Activity: Sit <> stand holding 5# KB 2 x 5 Standing row green TB 2 x 10 Shoulder ext green TB x 12 Green TB pull down with march x 10 Standing hip abd red TB 2 x 10 Standing hip ext 2 x 10 Heel raise x 15                                                                                                                         PATIENT EDUCATION:  Education details: Updated HEP  Person educated: Patient Education method: Explanation, Demonstration, Tactile cues, Verbal cues, and Handouts Education comprehension: verbalized understanding, returned demonstration, verbal cues required, tactile cues required, and needs further education  HOME EXERCISE PROGRAM: Access Code: [email or web address] URL: [email or web address] Date: 12/30/2023 Prepared by: Lamarr Price  Exercises - Seated Scapular Retraction  - 1 x daily - 7 x weekly - 3 sets - 10 reps - Sit to Stand Without Arm Support  - 1 x daily - 7 x weekly - 3 sets - 5 reps - Heel Raises with Counter Support  - 1 x daily - 7 x weekly - 2 sets - 10 reps - Supported Teacher, Music with Pelvic Floor Relaxation  - 1 x daily - 7 x weekly - 3 sets - 10 reps - 30 sec - 1 min hold - Seated Diaphragmatic Breathing  - 1 x daily - 7 x weekly - 3 sets - 10 reps - Standing Lumbar Extension with Counter  - 2 x daily - 7 x weekly - 1 sets - 3-5 reps - 10-20 sec hold - Standing Hip Abduction with Counter Support  - 2 x daily - 7 x weekly - 2 sets - 10 reps - Standing Hip Extension with Counter Support  - 2 x daily - 7 x weekly - 2 sets -  10 reps - Single Leg Stance  - 2 x daily - 7 x weekly - 1 sets - 3 reps - 20 -30 sec hold  ASSESSMENT:  CLINICAL IMPRESSION: Pt with pain in low back/glutes with rolling supine <> prone - added LTR and figure 4 LTR to address pain with rotation. Pt continues with TTP in bilat glutes and low back. PT encouraged pt to stretch and use heating pad at home to decrease pain irritability.   OBJECTIVE IMPAIRMENTS: Abnormal gait, decreased activity tolerance, decreased balance, decreased coordination, decreased endurance, decreased knowledge of condition, decreased mobility, difficulty walking, decreased ROM, decreased strength, hypomobility, increased fascial restrictions, increased muscle spasms, impaired flexibility, impaired UE functional use, improper body mechanics, postural dysfunction, and pain.     GOALS: Goals reviewed with patient? Yes  SHORT TERM GOALS: Target date: 01/15/24  Pt will be competent in initial HEP so she has increased independence for everyday activities Baseline: Goal status: INITIAL  2.  Pt will report 4/10 or less so she is able to complete household chores and all ADL's independently/safely.  Baseline: 6/10 Goal status: INITIAL  3.  Pt will demonstrate proper form technique for dead lifting so she can pick objects off the floor pain free and safely.  Baseline: unable to without pain  Goal status: INITIAL    LONG TERM GOALS: Target date: 02/09/23  Pt will be competent in advanced HEP so she has increased independence for everyday activities Baseline:  Goal status: INITIAL  2.  Pt will score 13% lower on Modified Oswestry scale, demonstrating decreased disability and increased functional capacity for house hold chores/ADLs.  Baseline: 34% (17/50) Goal status: INITIAL  3.  Pt will report ability to walk 1/2 a mile without low back pain and without rest breaks Baseline: unable Goal status: INITIAL   PLAN:  PT FREQUENCY: 1-2x/week  PT DURATION: 8  weeks  PLANNED INTERVENTIONS: 97164- PT Re-evaluation, 97110-Therapeutic exercises, 97530- Therapeutic activity, 97112- Neuromuscular re-education, 97535- Self Care, [id number]- Manual therapy, [id number]- Gait training, [phone]- Electrical stimulation (manual), [phone] (1-2 muscles), [id number] (3+ muscles)- Dry Needling, Patient/Family education, Balance training, Stair training, Joint mobilization, Spinal mobilization, Cryotherapy, and Moist heat.  PLAN FOR NEXT SESSION: core strengthening, postural strengthening, hip strengthening, thoracic/lumbar spinal mobility    Jenna Routzahn, PT 01/06/2024, 11:47 AM  "

## 2024-01-12 ENCOUNTER — Ambulatory Visit: Admitting: Physical Therapy

## 2024-01-14 ENCOUNTER — Ambulatory Visit

## 2024-01-19 ENCOUNTER — Encounter: Payer: Self-pay | Admitting: Physical Therapy

## 2024-01-19 ENCOUNTER — Ambulatory Visit: Attending: Family Medicine | Admitting: Physical Therapy

## 2024-01-19 DIAGNOSIS — M5459 Other low back pain: Secondary | ICD-10-CM | POA: Insufficient documentation

## 2024-01-19 NOTE — Therapy (Signed)
 " OUTPATIENT PHYSICAL THERAPY THORACOLUMBAR TREATMENT  Patient Name: Joy Anderson MRN: [id number] DOB:11-Apr-1950, 74 y.o., female Today's Date: 01/19/2024  END OF SESSION:  PT End of Session - 01/19/24 0928     Visit Number 5    Number of Visits 17    Date for Recertification  02/09/24    Authorization Type [employer]    Authorization Time Period 8 visits approved for PT 12/15/2023-03/14/2024    Authorization - Visit Number 5    Authorization - Number of Visits 8    Progress Note Due on Visit 10    PT Start Time 0845    PT Stop Time 0926    PT Time Calculation (min) 41 min    Activity Tolerance Patient tolerated treatment well    Behavior During Therapy [facility] for tasks assessed/performed             Past Medical History:  Diagnosis Date   Antiphospholipid antibody with hypercoagulable state 08/27/2010   DVT (deep venous thrombosis) (HCC)    anti phospholipid antibody + -- Dr Zannie   Hypertension    OAB (overactive bladder)    off meds   Post-menopausal    Proteinuria    Dr Glean   Raynaud's disease    Past Surgical History:  Procedure Laterality Date   AV FISTULA PLACEMENT  01/06/2014   CATARACT EXTRACTION, BILATERAL  01/07/2015   HIP SURGERY Right 10/2018   left one done 10/2019   TOTAL ABDOMINAL HYSTERECTOMY  01/07/1999   for fibroids w/ 1 oophorectomy   Patient Active Problem List   Diagnosis Date Noted   Hemorrhoids 09/29/2020   Bone infarct of distal femur, left with osteoarthritis 06/20/2020   Elevated triglycerides with high cholesterol 04/13/2017   Adnexal cyst 01/17/2017   Osteoarthritis, hip, bilateral 10/31/2015   Arteriovenous fistula for hemodialysis in place, primary 04/30/2015   ESRD on dialysis (HCC) 03/27/2015   Uremia 10/12/2014   Glaucoma of both eyes 07/06/2014   Pain in joint of left hip 05/25/2012   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   Antiphospholipid antibody with hypercoagulable state 08/27/2010    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   Vitamin D  deficiency 08/08/2010   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 07/15/2010   Gout 04/17/2010   Exophthalmos 12/12/2009   ANEMIA, IRON DEFICIENCY 11/20/2009   FATTY LIVER DISEASE 10/26/2009   PULMONARY HYPERTENSION, MILD 04/14/2008   PALPITATIONS 03/22/2008   RAYNAUD'S DISEASE 02/01/2008   SLE (systemic lupus erythematosus related syndrome) (HCC) 12/06/2007   Essential hypertension, benign 11/30/2007   POLYARTHRITIS 11/30/2007    PCP: Dorothyann Byars, MD  REFERRING PROVIDER: Dorothyann Byars, MD  REFERRING DIAG:  Diagnosis  M54.16 (ICD-10-CM) - Lumbar back pain with radiculopathy affecting lower extremity  M54.31 (ICD-10-CM) - Right sided sciatica    Rationale for Evaluation and Treatment: Rehabilitation  THERAPY DIAG:  Other low back pain  ONSET DATE: 4 months ago  SUBJECTIVE:  SUBJECTIVE STATEMENT: Pt states she still feels sore with rolling over and bending over. She states it is improving   PERTINENT HISTORY:  Raynauds SLE Polyarthritis Hip OA AV Fistula placement  DVT in 2012 Dialysis MWF  Pt reports no acute onset. She reports waking up one morning 4 months ago, having severe low back pain that has now increased severity to the right side. She states she's been having mild low back pain for last 6-7 months that she disregarded, until approx 4 months ago, waking up to 10/10 pain. She describes her discomfort as soreness, opposed to pain. She reports she takes warfarin to address hx DVT. She wants to do PT to address her pain and be able to walk for longer distances without having to stop.   PAIN:  Are you having pain? Yes: NPRS scale: 3/10 currently  Pain location: bil lower back,  radiating down right leg going into foot  Pain description: sore, sharp, pulsating lasting less than 1 min   Aggravating factors: bending over, picking up objects from floor  Relieving factors: sitting down, laying down on back or side lying, pain meds, heat   PRECAUTIONS: Fistula Rt UE  RED FLAGS: None   WEIGHT BEARING RESTRICTIONS: No  FALLS:  Has patient fallen in last 6 months? No  LIVING ENVIRONMENT: Lives with: lives with their family and lives with their daughter Lives in: House/apartment Stairs: Yes: External: 3 steps; on right going up Has following equipment at home: Single point cane uses only when going to dialysis 3x weekly   OCCUPATION: retired   PLOF: Independent  PATIENT GOALS: I just wanna be able to be normal, get rid of some of this pain, I want to walk longer than 20 min without pain  NEXT MD VISIT: Nothing planned   OBJECTIVE:  Note: Objective measures were completed at Evaluation unless otherwise noted.  DIAGNOSTIC FINDINGS:  IMPRESSION: Lower lumbar mild facet degenerative change.  PATIENT SURVEYS:  Modified Oswestry:  MODIFIED OSWESTRY DISABILITY SCALE  Date: 12/15/23 Score  Pain intensity 2 =  Pain medication provides me with complete relief from pain.  2. Personal care (washing, dressing, etc.) 0 =  I can take care of myself normally without causing increased pain.  3. Lifting 3 = Pain prevents me from lifting heavy weights, but I can manage light to medium weights if they are conveniently positioned  4. Walking 3 =  Pain prevents me from walking more than  mile.  5. Sitting 2 =  Pain prevents me from sitting more than 1 hour.  6. Standing 3 =  Pain prevents me from standing more than 1/2 hour.  7. Sleeping 0 = Pain does not prevent me from sleeping well.  8. Social Life 0 = My social life is normal and does not increase my pain.  9. Traveling 1 =  I can travel anywhere, but it increases my pain.  10. Employment/ Homemaking 3 = Pain  prevents me from doing anything but light duties.  Total 17/50   Interpretation of scores: Score Category Description  0-20% Minimal Disability The patient can cope with most living activities. Usually no treatment is indicated apart from advice on lifting, sitting and exercise  21-40% Moderate Disability The patient experiences more pain and difficulty with sitting, lifting and standing. Travel and social life are more difficult and they may be disabled from work. Personal care, sexual activity and sleeping are not grossly affected, and the patient can usually be managed by conservative means  41-60% Severe Disability Pain remains the main problem in this group, but activities of daily living are affected. These patients require a detailed investigation  61-80% Crippled Back pain impinges on all aspects of the patients life. Positive intervention is required  81-100% Bed-bound These patients are either bed-bound or exaggerating their symptoms  Bluford FORBES Zoe DELENA Karon DELENA, et al. Surgery versus conservative management of stable thoracolumbar fracture: the PRESTO feasibility RCT. [place] ([place]): [employer]; 2021 Nov. [facility] Technology Assessment, No. 25.62.) Appendix 3, Oswestry Disability Index category descriptors. Available from: Findjewelers.cz  Minimally Clinically Important Difference (MCID) = 12.8%  COGNITION: Overall cognitive status: Within functional limits for tasks assessed       POSTURE: rounded shoulders, forward head, increased thoracic kyphosis, and weight shift left  PALPATION: TTP at T7 and L1-L5  LUMBAR ROM:   AROM eval  Flexion WFL dis  Extension WFL pain   Right lateral flexion Limited 50 % discomfort   Left lateral flexion Limited 50%pain   Right rotation WFL  Left rotation WFL   (Blank rows = not tested)  LOWER EXTREMITY ROM:     Active  Right eval Left eval  Hip flexion    Hip extension    Hip abduction     Hip adduction    Hip internal rotation    Hip external rotation    Knee flexion    Knee extension    Ankle dorsiflexion    Ankle plantarflexion    Ankle inversion    Ankle eversion     (Blank rows = not tested)  LOWER EXTREMITY MMT:    MMT Right eval Left eval  Hip flexion 4 4  Hip extension 3+ 3+  Hip abduction 4- 3  Hip adduction    Hip internal rotation 4 4  Hip external rotation 4+ 4+  Knee flexion 4- 4-  Knee extension 4- pain  4- pain  Ankle dorsiflexion    Ankle plantarflexion    Ankle inversion    Ankle eversion     (Blank rows = not tested)  LUMBAR SPECIAL TESTS:  Slump test: Negative and Single leg stance test: Positive  FUNCTIONAL TESTS:  5 times sit to stand: 18.30 sec   GAIT: Distance walked: 100 ft Assistive device utilized: None Level of assistance: Complete Independence Comments: lateral trunk lean to the left   TREATMENT DATE:  [facility] Adult PT Treatment:                                                DATE: 01/19/24 Therapeutic Exercise: Seated HS stretch Seated figure 4 stretch Seated QL stretch  Neuromuscular re-ed: Seated pelvic tilt Therapeutic Activity: Walking x 4:43 with pt with increased soreness and tightness in bilat glutes and hips. She has notable decrease in cadence after 2 minutes and states my legs feel like lead Modified dead lift --> modified dead lift with 5# KB Resistesd walking green TB fwd x 10, bkwd x 10 Side step yellow TB Standing bent knee hip ext 2 x 10 Supine LTR Sidelying open book x 10 bilat   [facility] Adult PT Treatment:  DATE: 01/06/24 Therapeutic Exercise: Seated forward trunk flexion Seated pelvic tilts Seated piriformis stretch 2 x 20 sec  Neuromuscular re-ed: Seated add squeeze with LAQ x 10 bilat Green TB pull down with march x 10 Therapeutic Activity: LTR x 10 Figure 4 LTR x 10 bilat Standing bent knee hip ext x 10 bilat Standing Hip abd 2 x 10  bilat Modified SL deadlift Resisted walking green TB: fwd x 10, bkwd x 10   [facility] Adult PT Treatment:                                                DATE: 12/30/2023 Therapeutic Exercise: Obturator & gluteal self-massage with tennis ball Neuromuscular re-ed: Hooklying hip add isometric ball squeeze --> aggravated groin on Lt Small range marching with TA activation Small range SLR + TA activation --> discontinued d/t lumbar compensation Standing hip abd Standing hip extension (bent over, toe tap position) Modified single leg dead lift Single leg balance Therapeutic Activity: Prone prop on elbows 2 x 1 min Prone press up to elbows x 8 Supine diaphragmatic breathing Supported adductor butterfly stretch with bolster support + diaphragmatic breathing Seated trunk flexion --> reaching down to floor                                                                                                                         PATIENT EDUCATION:  Education details: Updated HEP  Person educated: Patient Education method: Programmer, Multimedia, Facilities Manager, Actor cues, Verbal cues, and Handouts Education comprehension: verbalized understanding, returned demonstration, verbal cues required, tactile cues required, and needs further education  HOME EXERCISE PROGRAM: Access Code: [email or web address] URL: [email or web address] Date: 12/30/2023 Prepared by: Lamarr Price  Exercises - Seated Scapular Retraction  - 1 x daily - 7 x weekly - 3 sets - 10 reps - Sit to Stand Without Arm Support  - 1 x daily - 7 x weekly - 3 sets - 5 reps - Heel Raises with Counter Support  - 1 x daily - 7 x weekly - 2 sets - 10 reps - Supported Teacher, Music with Pelvic Floor Relaxation  - 1 x daily - 7 x weekly - 3 sets - 10 reps - 30 sec - 1 min hold - Seated Diaphragmatic Breathing  - 1 x daily - 7 x weekly - 3 sets - 10 reps - Standing Lumbar Extension with Counter  - 2 x daily - 7 x weekly - 1 sets - 3-5 reps -  10-20 sec hold - Standing Hip Abduction with Counter Support  - 2 x daily - 7 x weekly - 2 sets - 10 reps - Standing Hip Extension with Counter Support  - 2 x daily - 7 x weekly - 2 sets - 10 reps - Single Leg Stance  - 2 x daily - 7 x weekly - 1 sets -  3 reps - 20 -30 sec hold  ASSESSMENT:  CLINICAL IMPRESSION: Pt is making steady progress towards goals. She has improved ability to bend and lift objects from floor without pain. She continues with heaviness in LEs with walking and pain in low back with rolling in bed. She will continue to benefit from skilled PT to work towards these goals   OBJECTIVE IMPAIRMENTS: Abnormal gait, decreased activity tolerance, decreased balance, decreased coordination, decreased endurance, decreased knowledge of condition, decreased mobility, difficulty walking, decreased ROM, decreased strength, hypomobility, increased fascial restrictions, increased muscle spasms, impaired flexibility, impaired UE functional use, improper body mechanics, postural dysfunction, and pain.     GOALS: Goals reviewed with patient? Yes  SHORT TERM GOALS: Target date: 01/15/24  Pt will be competent in initial HEP so she has increased independence for everyday activities Baseline: Goal status: MET  2.  Pt will report 4/10 or less so she is able to complete household chores and all ADL's independently/safely.  Baseline: 6/10 Goal status: MET  3.  Pt will demonstrate proper form technique for dead lifting so she can pick objects off the floor pain free and safely.  Baseline: unable to without pain  Goal status: MET    LONG TERM GOALS: Target date: 02/09/23  Pt will be competent in advanced HEP so she has increased independence for everyday activities Baseline:  Goal status: INITIAL  2.  Pt will score 13% lower on Modified Oswestry scale, demonstrating decreased disability and increased functional capacity for house hold chores/ADLs.  Baseline: 34% (17/50) Goal status:  INITIAL  3.  Pt will report ability to walk 1/2 a mile without low back pain and without rest breaks Baseline: unable Goal status: IN PROGRESS   PLAN:  PT FREQUENCY: 1-2x/week  PT DURATION: 8 weeks  PLANNED INTERVENTIONS: 97164- PT Re-evaluation, 97110-Therapeutic exercises, 97530- Therapeutic activity, 97112- Neuromuscular re-education, 97535- Self Care, [id number]- Manual therapy, [id number]- Gait training, [phone]- Electrical stimulation (manual), [phone] (1-2 muscles), [id number] (3+ muscles)- Dry Needling, Patient/Family education, Balance training, Stair training, Joint mobilization, Spinal mobilization, Cryotherapy, and Moist heat.  PLAN FOR NEXT SESSION: core strengthening, postural strengthening, hip strengthening, thoracic/lumbar spinal mobility    Liron Eissler, PT 01/19/2024, 9:28 AM  "

## 2024-01-20 ENCOUNTER — Other Ambulatory Visit

## 2024-01-21 ENCOUNTER — Ambulatory Visit

## 2024-01-21 DIAGNOSIS — M5459 Other low back pain: Secondary | ICD-10-CM

## 2024-01-21 NOTE — Therapy (Signed)
 " OUTPATIENT PHYSICAL THERAPY THORACOLUMBAR TREATMENT  Patient Name: Joy Anderson MRN: [id number] DOB:Sep 14, 1950, 74 y.o., female Today's Date: 01/21/2024  END OF SESSION:  PT End of Session - 01/21/24 0851     Visit Number 6    Number of Visits 17    Date for Recertification  02/09/24    Authorization Type [employer]    Authorization Time Period 8 visits approved for PT 12/15/2023-03/14/2024    Authorization - Visit Number 6    Authorization - Number of Visits 8    PT Start Time 0850    PT Stop Time 0932    PT Time Calculation (min) 42 min    Activity Tolerance Patient tolerated treatment well    Behavior During Therapy [facility] for tasks assessed/performed         Past Medical History:  Diagnosis Date   Antiphospholipid antibody with hypercoagulable state 08/27/2010   DVT (deep venous thrombosis) (HCC)    anti phospholipid antibody + -- Dr Zannie   Hypertension    OAB (overactive bladder)    off meds   Post-menopausal    Proteinuria    Dr Glean   Raynaud's disease    Past Surgical History:  Procedure Laterality Date   AV FISTULA PLACEMENT  01/06/2014   CATARACT EXTRACTION, BILATERAL  01/07/2015   HIP SURGERY Right 10/2018   left one done 10/2019   TOTAL ABDOMINAL HYSTERECTOMY  01/07/1999   for fibroids w/ 1 oophorectomy   Patient Active Problem List   Diagnosis Date Noted   Hemorrhoids 09/29/2020   Bone infarct of distal femur, left with osteoarthritis 06/20/2020   Elevated triglycerides with high cholesterol 04/13/2017   Adnexal cyst 01/17/2017   Osteoarthritis, hip, bilateral 10/31/2015   Arteriovenous fistula for hemodialysis in place, primary 04/30/2015   ESRD on dialysis (HCC) 03/27/2015   Uremia 10/12/2014   Glaucoma of both eyes 07/06/2014   Pain in joint of left hip 05/25/2012   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   Antiphospholipid antibody with hypercoagulable state 08/27/2010   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    Vitamin D  deficiency 08/08/2010   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 07/15/2010   Gout 04/17/2010   Exophthalmos 12/12/2009   ANEMIA, IRON DEFICIENCY 11/20/2009   FATTY LIVER DISEASE 10/26/2009   PULMONARY HYPERTENSION, MILD 04/14/2008   PALPITATIONS 03/22/2008   RAYNAUD'S DISEASE 02/01/2008   SLE (systemic lupus erythematosus related syndrome) (HCC) 12/06/2007   Essential hypertension, benign 11/30/2007   POLYARTHRITIS 11/30/2007    PCP: Dorothyann Byars, MD  REFERRING PROVIDER: Dorothyann Byars, MD  REFERRING DIAG:  Diagnosis  M54.16 (ICD-10-CM) - Lumbar back pain with radiculopathy affecting lower extremity  M54.31 (ICD-10-CM) - Right sided sciatica    Rationale for Evaluation and Treatment: Rehabilitation  THERAPY DIAG:  Other low back pain  ONSET DATE: 4 months ago  SUBJECTIVE:  SUBJECTIVE STATEMENT: Patient reports she continues to have soreness in bilateral hips when rolling over in bed or bending forward; states she felt good after last visit.    PERTINENT HISTORY:  Raynauds SLE Polyarthritis Hip OA AV Fistula placement  DVT in 2012 Dialysis MWF  Pt reports no acute onset. She reports waking up one morning 4 months ago, having severe low back pain that has now increased severity to the right side. She states she's been having mild low back pain for last 6-7 months that she disregarded, until approx 4 months ago, waking up to 10/10 pain. She describes her discomfort as soreness, opposed to pain. She reports she takes warfarin to address hx DVT. She wants to do PT to address her pain and be able to walk for longer distances without having to stop.   PAIN:  Are you having pain? Yes: NPRS scale: 6/10 soreness Pain location: bil lower back,  radiating down right leg going into foot  Pain description: sore, sharp, pulsating lasting less than 1 min   Aggravating factors: bending over, picking up objects from floor  Relieving factors: sitting down, laying down on back or side lying, pain meds, heat   PRECAUTIONS: Fistula Rt UE  RED FLAGS: None   WEIGHT BEARING RESTRICTIONS: No  FALLS:  Has patient fallen in last 6 months? No  LIVING ENVIRONMENT: Lives with: lives with their family and lives with their daughter Lives in: House/apartment Stairs: Yes: External: 3 steps; on right going up Has following equipment at home: Single point cane uses only when going to dialysis 3x weekly   OCCUPATION: retired   PLOF: Independent  PATIENT GOALS: I just wanna be able to be normal, get rid of some of this pain, I want to walk longer than 20 min without pain  NEXT MD VISIT: Nothing planned   OBJECTIVE:  Note: Objective measures were completed at Evaluation unless otherwise noted.  DIAGNOSTIC FINDINGS:  IMPRESSION: Lower lumbar mild facet degenerative change.  PATIENT SURVEYS:  Modified Oswestry:  MODIFIED OSWESTRY DISABILITY SCALE  Date: 12/15/23 Score  Pain intensity 2 =  Pain medication provides me with complete relief from pain.  2. Personal care (washing, dressing, etc.) 0 =  I can take care of myself normally without causing increased pain.  3. Lifting 3 = Pain prevents me from lifting heavy weights, but I can manage light to medium weights if they are conveniently positioned  4. Walking 3 =  Pain prevents me from walking more than  mile.  5. Sitting 2 =  Pain prevents me from sitting more than 1 hour.  6. Standing 3 =  Pain prevents me from standing more than 1/2 hour.  7. Sleeping 0 = Pain does not prevent me from sleeping well.  8. Social Life 0 = My social life is normal and does not increase my pain.  9. Traveling 1 =  I can travel anywhere, but it increases my pain.  10. Employment/ Homemaking 3 = Pain  prevents me from doing anything but light duties.  Total 17/50   Interpretation of scores: Score Category Description  0-20% Minimal Disability The patient can cope with most living activities. Usually no treatment is indicated apart from advice on lifting, sitting and exercise  21-40% Moderate Disability The patient experiences more pain and difficulty with sitting, lifting and standing. Travel and social life are more difficult and they may be disabled from work. Personal care, sexual activity and sleeping are not grossly affected, and the  patient can usually be managed by conservative means  41-60% Severe Disability Pain remains the main problem in this group, but activities of daily living are affected. These patients require a detailed investigation  61-80% Crippled Back pain impinges on all aspects of the patients life. Positive intervention is required  81-100% Bed-bound These patients are either bed-bound or exaggerating their symptoms  Bluford FORBES Zoe DELENA Karon DELENA, et al. Surgery versus conservative management of stable thoracolumbar fracture: the PRESTO feasibility RCT. [place] ([place]): [employer]; 2021 Nov. [facility] Technology Assessment, No. 25.62.) Appendix 3, Oswestry Disability Index category descriptors. Available from: [email or web address].cz  Minimally Clinically Important Difference (MCID) = 12.8%  COGNITION: Overall cognitive status: Within functional limits for tasks assessed     POSTURE: rounded shoulders, forward head, increased thoracic kyphosis, and weight shift left  PALPATION: TTP at T7 and L1-L5  LUMBAR ROM:   AROM eval  Flexion WFL dis  Extension WFL pain   Right lateral flexion Limited 50 % discomfort   Left lateral flexion Limited 50%pain   Right rotation WFL  Left rotation WFL   (Blank rows = not tested)  LOWER EXTREMITY ROM:     Active  Right eval Left eval  Hip flexion    Hip extension    Hip abduction     Hip adduction    Hip internal rotation    Hip external rotation    Knee flexion    Knee extension    Ankle dorsiflexion    Ankle plantarflexion    Ankle inversion    Ankle eversion     (Blank rows = not tested)  LOWER EXTREMITY MMT:    MMT Right eval Left eval  Hip flexion 4 4  Hip extension 3+ 3+  Hip abduction 4- 3  Hip adduction    Hip internal rotation 4 4  Hip external rotation 4+ 4+  Knee flexion 4- 4-  Knee extension 4- pain  4- pain  Ankle dorsiflexion    Ankle plantarflexion    Ankle inversion    Ankle eversion     (Blank rows = not tested)  LUMBAR SPECIAL TESTS:  Slump test: Negative and Single leg stance test: Positive  FUNCTIONAL TESTS:  5 times sit to stand: 18.30 sec   GAIT: Distance walked: 100 ft Assistive device utilized: None Level of assistance: Complete Independence Comments: lateral trunk lean to the left   TREATMENT DATE:   [facility] Adult PT Treatment:                                                DATE: 01/21/2024 Therapeutic Exercise: Quadruped rock back stretch Cat/cow  LTR x 1 min --> wide leg hip IR/ER x 1 min Side lying open books 10 x 3 sec: Pillow under bottom hip Bottom leg straight Top leg bent and resting on soft foam roller Hand behind head Neuromuscular re-ed: Bridges 10 x 3 sec Supine marching + abdominal bracing engagement  Marching bridges Therapeutic Activity: Seated hip hinge dead lift with foam roller (rolling down/up shins) Seated dead lift + 5#KB Seated trunk extension with blue TB (therapist holding band)  Walking + 5#KB farmer carry x 160'   [facility] Adult PT Treatment:  DATE: 01/19/24 Therapeutic Exercise: Seated HS stretch Seated figure 4 stretch Seated QL stretch  Neuromuscular re-ed: Seated pelvic tilt Therapeutic Activity: Walking x 4:43 with pt with increased soreness and tightness in bilat glutes and hips. She has notable decrease in cadence after 2  minutes and states my legs feel like lead Modified dead lift --> modified dead lift with 5# KB Resistesd walking green TB fwd x 10, bkwd x 10 Side step yellow TB Standing bent knee hip ext 2 x 10 Supine LTR Sidelying open book x 10 bilat   [facility] Adult PT Treatment:                                                DATE: 01/06/24 Therapeutic Exercise: Seated forward trunk flexion Seated pelvic tilts Seated piriformis stretch 2 x 20 sec  Neuromuscular re-ed: Seated add squeeze with LAQ x 10 bilat Green TB pull down with march x 10 Therapeutic Activity: LTR x 10 Figure 4 LTR x 10 bilat Standing bent knee hip ext x 10 bilat Standing Hip abd 2 x 10 bilat Modified SL deadlift Resisted walking green TB: fwd x 10, bkwd x 10                                                                                                                    PATIENT EDUCATION:  Education details: Updated HEP  Person educated: Patient Education method: Explanation, Demonstration, Tactile cues, Verbal cues, and Handouts Education comprehension: verbalized understanding, returned demonstration, verbal cues required, tactile cues required, and needs further education  HOME EXERCISE PROGRAM: Access Code: [email or web address] URL: [email or web address] Date: 01/21/2024 Prepared by: Lamarr Price  Exercises - Seated Scapular Retraction  - 1 x daily - 7 x weekly - 3 sets - 10 reps - Sit to Stand Without Arm Support  - 1 x daily - 7 x weekly - 3 sets - 5 reps - Heel Raises with Counter Support  - 1 x daily - 7 x weekly - 2 sets - 10 reps - Supported Teacher, Music with Pelvic Floor Relaxation  - 1 x daily - 7 x weekly - 3 sets - 10 reps - 30 sec - 1 min hold - Seated Diaphragmatic Breathing  - 1 x daily - 7 x weekly - 3 sets - 10 reps - Standing Lumbar Extension with Counter  - 2 x daily - 7 x weekly - 1 sets - 3-5 reps - 10-20 sec hold - Standing Hip Abduction with Counter Support  - 2 x daily - 7 x  weekly - 2 sets - 10 reps - Standing Hip Extension with Counter Support  - 2 x daily - 7 x weekly - 2 sets - 10 reps - Single Leg Stance  - 2 x daily - 7 x weekly - 1 sets - 3 reps - 20 -30 sec hold - Sidelying Open  Book Thoracic Lumbar Rotation and Extension  - 2 x daily - 7 x weekly - 1 sets - 10 reps - 3 sec hold  ASSESSMENT:  CLINICAL IMPRESSION: Continued thoracolumbar mobility exercises with good tolerance. Hip hinge mechanics progressed from quadruped rock back to seated dead lift with occasional cueing for postural alignment and thoracic stability. Added bilateral resisted carries to challenge core and postural stability.    OBJECTIVE IMPAIRMENTS: Abnormal gait, decreased activity tolerance, decreased balance, decreased coordination, decreased endurance, decreased knowledge of condition, decreased mobility, difficulty walking, decreased ROM, decreased strength, hypomobility, increased fascial restrictions, increased muscle spasms, impaired flexibility, impaired UE functional use, improper body mechanics, postural dysfunction, and pain.     GOALS: Goals reviewed with patient? Yes  SHORT TERM GOALS: Target date: 01/15/24  Pt will be competent in initial HEP so she has increased independence for everyday activities Baseline: Goal status: MET  2.  Pt will report 4/10 or less so she is able to complete household chores and all ADL's independently/safely.  Baseline: 6/10 Goal status: MET  3.  Pt will demonstrate proper form technique for dead lifting so she can pick objects off the floor pain free and safely.  Baseline: unable to without pain  Goal status: MET    LONG TERM GOALS: Target date: 02/09/23  Pt will be competent in advanced HEP so she has increased independence for everyday activities Baseline:  Goal status: INITIAL  2.  Pt will score 13% lower on Modified Oswestry scale, demonstrating decreased disability and increased functional capacity for house hold chores/ADLs.   Baseline: 34% (17/50) Goal status: INITIAL  3.  Pt will report ability to walk 1/2 a mile without low back pain and without rest breaks Baseline: unable Goal status: IN PROGRESS   PLAN:  PT FREQUENCY: 1-2x/week  PT DURATION: 8 weeks  PLANNED INTERVENTIONS: 97164- PT Re-evaluation, 97110-Therapeutic exercises, 97530- Therapeutic activity, 97112- Neuromuscular re-education, 97535- Self Care, [id number]- Manual therapy, [id number]- Gait training, [phone]- Electrical stimulation (manual), [phone] (1-2 muscles), [id number] (3+ muscles)- Dry Needling, Patient/Family education, Balance training, Stair training, Joint mobilization, Spinal mobilization, Cryotherapy, and Moist heat.  PLAN FOR NEXT SESSION: Submit for more auth? Core strengthening, postural strengthening, hip strengthening, thoracic/lumbar spinal mobility    Lamarr GORMAN Price, PTA 01/21/2024, 10:55 AM  "

## 2024-01-26 ENCOUNTER — Encounter: Payer: Self-pay | Admitting: Physical Therapy

## 2024-01-26 ENCOUNTER — Ambulatory Visit: Admitting: Physical Therapy

## 2024-01-26 DIAGNOSIS — M5459 Other low back pain: Secondary | ICD-10-CM

## 2024-01-26 NOTE — Therapy (Signed)
 " OUTPATIENT PHYSICAL THERAPY THORACOLUMBAR TREATMENT  Patient Name: Joy Anderson MRN: [id number] DOB:[birth year], 74 y.o., female Today's Date: 01/26/2024  END OF SESSION:  PT End of Session - 01/26/24 0927     Visit Number 7    Number of Visits 17    Date for Recertification  02/09/24    Authorization Type [employer]    Authorization Time Period 8 visits approved for PT 12/15/2023-03/14/2024    Authorization - Visit Number 7    Authorization - Number of Visits 8    PT Start Time 0848    PT Stop Time 0927    PT Time Calculation (min) 39 min    Activity Tolerance Patient tolerated treatment well    Behavior During Therapy [facility] for tasks assessed/performed          Past Medical History:  Diagnosis Date   Antiphospholipid antibody with hypercoagulable state 08/27/2010   DVT (deep venous thrombosis) (HCC)    anti phospholipid antibody + -- Dr Zannie   Hypertension    OAB (overactive bladder)    off meds   Post-menopausal    Proteinuria    Dr Glean   Raynaud's disease    Past Surgical History:  Procedure Laterality Date   AV FISTULA PLACEMENT  01/06/2014   CATARACT EXTRACTION, BILATERAL  01/07/2015   HIP SURGERY Right 10/2018   left one done 10/2019   TOTAL ABDOMINAL HYSTERECTOMY  01/07/1999   for fibroids w/ 1 oophorectomy   Patient Active Problem List   Diagnosis Date Noted   Hemorrhoids 09/29/2020   Bone infarct of distal femur, left with osteoarthritis 06/20/2020   Elevated triglycerides with high cholesterol 04/13/2017   Adnexal cyst 01/17/2017   Osteoarthritis, hip, bilateral 10/31/2015   Arteriovenous fistula for hemodialysis in place, primary 04/30/2015   ESRD on dialysis (HCC) 03/27/2015   Uremia 10/12/2014   Glaucoma of both eyes 07/06/2014   Pain in joint of left hip 05/25/2012   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   Antiphospholipid antibody with hypercoagulable state 08/27/2010   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    Vitamin D  deficiency 08/08/2010   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 07/15/2010   Gout 04/17/2010   Exophthalmos 12/12/2009   ANEMIA, IRON DEFICIENCY 11/20/2009   FATTY LIVER DISEASE 10/26/2009   PULMONARY HYPERTENSION, MILD 04/14/2008   PALPITATIONS 03/22/2008   RAYNAUD'S DISEASE 02/01/2008   SLE (systemic lupus erythematosus related syndrome) (HCC) 12/06/2007   Essential hypertension, benign 11/30/2007   POLYARTHRITIS 11/30/2007    PCP: Dorothyann Byars, MD  REFERRING PROVIDER: Dorothyann Byars, MD  REFERRING DIAG:  Diagnosis  M54.16 (ICD-10-CM) - Lumbar back pain with radiculopathy affecting lower extremity  M54.31 (ICD-10-CM) - Right sided sciatica    Rationale for Evaluation and Treatment: Rehabilitation  THERAPY DIAG:  Other low back pain  ONSET DATE: 4 months ago  SUBJECTIVE:  SUBJECTIVE STATEMENT: I'm doing better. It's coming along   PERTINENT HISTORY:  Raynauds SLE Polyarthritis Hip OA AV Fistula placement  DVT in 2012 Dialysis MWF  Pt reports no acute onset. She reports waking up one morning 4 months ago, having severe low back pain that has now increased severity to the right side. She states she's been having mild low back pain for last 6-7 months that she disregarded, until approx 4 months ago, waking up to 10/10 pain. She describes her discomfort as soreness, opposed to pain. She reports she takes warfarin to address hx DVT. She wants to do PT to address her pain and be able to walk for longer distances without having to stop.   PAIN:  Are you having pain? Yes: NPRS scale: 0/10 soreness Pain location: bil lower back, radiating down right leg going into foot  Pain description: sore, sharp, pulsating lasting less than 1 min    Aggravating factors: bending over, picking up objects from floor  Relieving factors: sitting down, laying down on back or side lying, pain meds, heat   PRECAUTIONS: Fistula Rt UE  RED FLAGS: None   WEIGHT BEARING RESTRICTIONS: No  FALLS:  Has patient fallen in last 6 months? No  LIVING ENVIRONMENT: Lives with: lives with their family and lives with their daughter Lives in: House/apartment Stairs: Yes: External: 3 steps; on right going up Has following equipment at home: Single point cane uses only when going to dialysis 3x weekly   OCCUPATION: retired   PLOF: Independent  PATIENT GOALS: I just wanna be able to be normal, get rid of some of this pain, I want to walk longer than 20 min without pain  NEXT MD VISIT: Nothing planned   OBJECTIVE:  Note: Objective measures were completed at Evaluation unless otherwise noted.  DIAGNOSTIC FINDINGS:  IMPRESSION: Lower lumbar mild facet degenerative change.  PATIENT SURVEYS:  Modified Oswestry:  MODIFIED OSWESTRY DISABILITY SCALE  Date: 12/15/23 Score  Pain intensity 2 =  Pain medication provides me with complete relief from pain.  2. Personal care (washing, dressing, etc.) 0 =  I can take care of myself normally without causing increased pain.  3. Lifting 3 = Pain prevents me from lifting heavy weights, but I can manage light to medium weights if they are conveniently positioned  4. Walking 3 =  Pain prevents me from walking more than  mile.  5. Sitting 2 =  Pain prevents me from sitting more than 1 hour.  6. Standing 3 =  Pain prevents me from standing more than 1/2 hour.  7. Sleeping 0 = Pain does not prevent me from sleeping well.  8. Social Life 0 = My social life is normal and does not increase my pain.  9. Traveling 1 =  I can travel anywhere, but it increases my pain.  10. Employment/ Homemaking 3 = Pain prevents me from doing anything but light duties.  Total 17/50   Interpretation of scores: Score Category  Description  0-20% Minimal Disability The patient can cope with most living activities. Usually no treatment is indicated apart from advice on lifting, sitting and exercise  21-40% Moderate Disability The patient experiences more pain and difficulty with sitting, lifting and standing. Travel and social life are more difficult and they may be disabled from work. Personal care, sexual activity and sleeping are not grossly affected, and the patient can usually be managed by conservative means  41-60% Severe Disability Pain remains the main problem in this group,  but activities of daily living are affected. These patients require a detailed investigation  61-80% Crippled Back pain impinges on all aspects of the patients life. Positive intervention is required  81-100% Bed-bound These patients are either bed-bound or exaggerating their symptoms  Bluford FORBES Zoe DELENA Karon DELENA, et al. Surgery versus conservative management of stable thoracolumbar fracture: the PRESTO feasibility RCT. [place] ([place]): [employer]; 2021 Nov. [facility] Technology Assessment, No. 25.62.) Appendix 3, Oswestry Disability Index category descriptors. Available from: [email or web address].cz  Minimally Clinically Important Difference (MCID) = 12.8%  COGNITION: Overall cognitive status: Within functional limits for tasks assessed     POSTURE: rounded shoulders, forward head, increased thoracic kyphosis, and weight shift left  PALPATION: TTP at T7 and L1-L5  LUMBAR ROM:   AROM eval  Flexion WFL dis  Extension WFL pain   Right lateral flexion Limited 50 % discomfort   Left lateral flexion Limited 50%pain   Right rotation WFL  Left rotation WFL   (Blank rows = not tested)  LOWER EXTREMITY ROM:     Active  Right eval Left eval  Hip flexion    Hip extension    Hip abduction    Hip adduction    Hip internal rotation    Hip external rotation    Knee flexion    Knee extension    Ankle  dorsiflexion    Ankle plantarflexion    Ankle inversion    Ankle eversion     (Blank rows = not tested)  LOWER EXTREMITY MMT:    MMT Right eval Left eval  Hip flexion 4 4  Hip extension 3+ 3+  Hip abduction 4- 3  Hip adduction    Hip internal rotation 4 4  Hip external rotation 4+ 4+  Knee flexion 4- 4-  Knee extension 4- pain  4- pain  Ankle dorsiflexion    Ankle plantarflexion    Ankle inversion    Ankle eversion     (Blank rows = not tested)  LUMBAR SPECIAL TESTS:  Slump test: Negative and Single leg stance test: Positive  FUNCTIONAL TESTS:  5 times sit to stand: 18.30 sec   GAIT: Distance walked: 100 ft Assistive device utilized: None Level of assistance: Complete Independence Comments: lateral trunk lean to the left   TREATMENT DATE:   [facility] Adult PT Treatment:                                                DATE: 01/26/24 Therapeutic Exercise: Quadruped rock back stretch Cat/cow Seated HS stretch Seated figure 4 stretch Seated QL stretch HEP review  Therapeutic Activity: Modified dead lift 5# x 10 Resisted walking 12.5# backward, 10# forward Pallof step out green TB Suitcase carry 5# x 2 laps Sit <> stand without UE support 2 x 10 Pelvic tilts seated   [facility] Adult PT Treatment:                                                DATE: 01/21/2024 Therapeutic Exercise: Quadruped rock back stretch Cat/cow  LTR x 1 min --> wide leg hip IR/ER x 1 min Side lying open books 10 x 3 sec: Pillow under bottom hip Bottom leg straight Top leg bent and  resting on soft foam roller Hand behind head Neuromuscular re-ed: Bridges 10 x 3 sec Supine marching + abdominal bracing engagement  Marching bridges Therapeutic Activity: Seated hip hinge dead lift with foam roller (rolling down/up shins) Seated dead lift + 5#KB Seated trunk extension with blue TB (therapist holding band)  Walking + 5#KB farmer carry x 160'   [facility] Adult PT Treatment:                                                 DATE: 01/19/24 Therapeutic Exercise: Seated HS stretch Seated figure 4 stretch Seated QL stretch  Neuromuscular re-ed: Seated pelvic tilt Therapeutic Activity: Walking x 4:43 with pt with increased soreness and tightness in bilat glutes and hips. She has notable decrease in cadence after 2 minutes and states my legs feel like lead Modified dead lift --> modified dead lift with 5# KB Resistesd walking green TB fwd x 10, bkwd x 10 Side step yellow TB Standing bent knee hip ext 2 x 10 Supine LTR Sidelying open book x 10 bilat   [facility] Adult PT Treatment:                                                DATE: 01/06/24 Therapeutic Exercise: Seated forward trunk flexion Seated pelvic tilts Seated piriformis stretch 2 x 20 sec  Neuromuscular re-ed: Seated add squeeze with LAQ x 10 bilat Green TB pull down with march x 10 Therapeutic Activity: LTR x 10 Figure 4 LTR x 10 bilat Standing bent knee hip ext x 10 bilat Standing Hip abd 2 x 10 bilat Modified SL deadlift Resisted walking green TB: fwd x 10, bkwd x 10                                                                                                                    PATIENT EDUCATION:  Education details: Updated HEP  Person educated: Patient Education method: Explanation, Demonstration, Tactile cues, Verbal cues, and Handouts Education comprehension: verbalized understanding, returned demonstration, verbal cues required, tactile cues required, and needs further education  HOME EXERCISE PROGRAM: Access Code: [email or web address] URL: [email or web address] Date: 01/26/2024 Prepared by: Darice Conine  Exercises - Sit to Stand Without Arm Support  - 1 x daily - 7 x weekly - 3 sets - 5 reps - Heel Raises with Counter Support  - 1 x daily - 7 x weekly - 2 sets - 10 reps - Standing Lumbar Extension with Counter  - 2 x daily - 7 x weekly - 1 sets - 3-5 reps - 10-20 sec hold - Single Leg Stance  - 2 x  daily - 7 x weekly - 1 sets - 3 reps - 20 -30 sec hold - Sidelying Open  Book Thoracic Lumbar Rotation and Extension  - 2 x daily - 7 x weekly - 1 sets - 10 reps - 3 sec hold - Seated Figure 4 Piriformis Stretch  - 1 x daily - 7 x weekly - 1 sets - 3 reps - 30 sec hold - Seated Quadratus Lumborum Stretch in Chair  - 1 x daily - 7 x weekly - 1 sets - 10 reps - 10 sec hold - Pallof Press With Lateral Step  - 1 x daily - 7 x weekly - 3 sets - 10 reps - Half Deadlift with Kettlebell  - 1 x daily - 7 x weekly - 1 sets - 10 reps  ASSESSMENT:  CLINICAL IMPRESSION: Pt states she feels ready to d/c after next visit. She is progressing well and has good understanding of HEP.  She is limited by decreased endurance and PT recommended walking 30 minutes a day in 3-4 minute increments with limited rest breaks to work towards increased endurance. Plan to finalize HEP and d/c after next visit   OBJECTIVE IMPAIRMENTS: Abnormal gait, decreased activity tolerance, decreased balance, decreased coordination, decreased endurance, decreased knowledge of condition, decreased mobility, difficulty walking, decreased ROM, decreased strength, hypomobility, increased fascial restrictions, increased muscle spasms, impaired flexibility, impaired UE functional use, improper body mechanics, postural dysfunction, and pain.     GOALS: Goals reviewed with patient? Yes  SHORT TERM GOALS: Target date: 01/15/24  Pt will be competent in initial HEP so she has increased independence for everyday activities Baseline: Goal status: MET  2.  Pt will report 4/10 or less so she is able to complete household chores and all ADL's independently/safely.  Baseline: 6/10 Goal status: MET  3.  Pt will demonstrate proper form technique for dead lifting so she can pick objects off the floor pain free and safely.  Baseline: unable to without pain  Goal status: MET    LONG TERM GOALS: Target date: 02/09/23  Pt will be competent in advanced  HEP so she has increased independence for everyday activities Baseline:  Goal status: INITIAL  2.  Pt will score 13% lower on Modified Oswestry scale, demonstrating decreased disability and increased functional capacity for house hold chores/ADLs.  Baseline: 34% (17/50) Goal status: INITIAL  3.  Pt will report ability to walk 1/2 a mile without low back pain and without rest breaks Baseline: unable Goal status: IN PROGRESS   PLAN:  PT FREQUENCY: 1-2x/week  PT DURATION: 8 weeks  PLANNED INTERVENTIONS: 97164- PT Re-evaluation, 97110-Therapeutic exercises, 97530- Therapeutic activity, 97112- Neuromuscular re-education, 97535- Self Care, [id number]- Manual therapy, [id number]- Gait training, [phone]- Electrical stimulation (manual), [phone] (1-2 muscles), [phone] (3+ muscles)- Dry Needling, Patient/Family education, Balance training, Stair training, Joint mobilization, Spinal mobilization, Cryotherapy, and Moist heat.  PLAN FOR NEXT SESSION: CHECK GOALS, FINALIZE HEP, D/C   Clara Herbison, PT 01/26/2024, 9:28 AM  "

## 2024-01-28 ENCOUNTER — Ambulatory Visit

## 2024-01-28 DIAGNOSIS — M5459 Other low back pain: Secondary | ICD-10-CM | POA: Diagnosis not present

## 2024-01-28 NOTE — Therapy (Signed)
 " OUTPATIENT PHYSICAL THERAPY THORACOLUMBAR TREATMENT  Patient Name: Joy Anderson MRN: [id number] DOB:[birth year], 74 y.o., female Today's Date: 01/28/2024  END OF SESSION:  PT End of Session - 01/28/24 0848     Visit Number 8    Number of Visits 17    Date for Recertification  02/09/24    Authorization Type [employer]    Authorization Time Period 8 visits approved for PT 12/15/2023-03/14/2024    Authorization - Visit Number 8    Authorization - Number of Visits 8    PT Start Time 0848    PT Stop Time 0933    PT Time Calculation (min) 45 min    Activity Tolerance Patient tolerated treatment well    Behavior During Therapy [facility] for tasks assessed/performed          Past Medical History:  Diagnosis Date   Antiphospholipid antibody with hypercoagulable state 08/27/2010   DVT (deep venous thrombosis) (HCC)    anti phospholipid antibody + -- Dr Zannie   Hypertension    OAB (overactive bladder)    off meds   Post-menopausal    Proteinuria    Dr Glean   Raynaud's disease    Past Surgical History:  Procedure Laterality Date   AV FISTULA PLACEMENT  01/06/2014   CATARACT EXTRACTION, BILATERAL  01/07/2015   HIP SURGERY Right 10/2018   left one done 10/2019   TOTAL ABDOMINAL HYSTERECTOMY  01/07/1999   for fibroids w/ 1 oophorectomy   Patient Active Problem List   Diagnosis Date Noted   Hemorrhoids 09/29/2020   Bone infarct of distal femur, left with osteoarthritis 06/20/2020   Elevated triglycerides with high cholesterol 04/13/2017   Adnexal cyst 01/17/2017   Osteoarthritis, hip, bilateral 10/31/2015   Arteriovenous fistula for hemodialysis in place, primary 04/30/2015   ESRD on dialysis (HCC) 03/27/2015   Uremia 10/12/2014   Glaucoma of both eyes 07/06/2014   Pain in joint of left hip 05/25/2012   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   Antiphospholipid antibody with hypercoagulable state 08/27/2010   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    Vitamin D  deficiency 08/08/2010   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 07/15/2010   Gout 04/17/2010   Exophthalmos 12/12/2009   ANEMIA, IRON DEFICIENCY 11/20/2009   FATTY LIVER DISEASE 10/26/2009   PULMONARY HYPERTENSION, MILD 04/14/2008   PALPITATIONS 03/22/2008   RAYNAUD'S DISEASE 02/01/2008   SLE (systemic lupus erythematosus related syndrome) (HCC) 12/06/2007   Essential hypertension, benign 11/30/2007   POLYARTHRITIS 11/30/2007    PCP: Dorothyann Byars, MD  REFERRING PROVIDER: Dorothyann Byars, MD  REFERRING DIAG:  Diagnosis  M54.16 (ICD-10-CM) - Lumbar back pain with radiculopathy affecting lower extremity  M54.31 (ICD-10-CM) - Right sided sciatica    Rationale for Evaluation and Treatment: Rehabilitation  THERAPY DIAG:  Other low back pain  ONSET DATE: 4 months ago  SUBJECTIVE:  SUBJECTIVE STATEMENT: Patient reports her back is not as sore as it was, states her back is much better.   PERTINENT HISTORY:  Raynauds SLE Polyarthritis Hip OA AV Fistula placement  DVT in 2012 Dialysis MWF  Pt reports no acute onset. She reports waking up one morning 4 months ago, having severe low back pain that has now increased severity to the right side. She states she's been having mild low back pain for last 6-7 months that she disregarded, until approx 4 months ago, waking up to 10/10 pain. She describes her discomfort as soreness, opposed to pain. She reports she takes warfarin to address hx DVT. She wants to do PT to address her pain and be able to walk for longer distances without having to stop.   PAIN:  Are you having pain? Yes: NPRS scale: 0/10 soreness Pain location: bil lower back, radiating down right leg going into foot  Pain description: sore,  sharp, pulsating lasting less than 1 min   Aggravating factors: bending over, picking up objects from floor  Relieving factors: sitting down, laying down on back or side lying, pain meds, heat   PRECAUTIONS: Fistula Rt UE  RED FLAGS: None   WEIGHT BEARING RESTRICTIONS: No  FALLS:  Has patient fallen in last 6 months? No  LIVING ENVIRONMENT: Lives with: lives with their family and lives with their daughter Lives in: House/apartment Stairs: Yes: External: 3 steps; on right going up Has following equipment at home: Single point cane uses only when going to dialysis 3x weekly   OCCUPATION: retired   PLOF: Independent  PATIENT GOALS: I just wanna be able to be normal, get rid of some of this pain, I want to walk longer than 20 min without pain  NEXT MD VISIT: Nothing planned   OBJECTIVE:  Note: Objective measures were completed at Evaluation unless otherwise noted.  DIAGNOSTIC FINDINGS:  IMPRESSION: Lower lumbar mild facet degenerative change.  PATIENT SURVEYS:  Modified Oswestry:  MODIFIED OSWESTRY DISABILITY SCALE 01/28/24  Date: 12/15/23 Score   Pain intensity 2 =  Pain medication provides me with complete relief from pain. 0 = I can tolerate the pain I have without having to use pain medication  2. Personal care (washing, dressing, etc.) 0 =  I can take care of myself normally without causing increased pain.  0 =  I can take care of myself normally without causing increased pain.  3. Lifting 3 = Pain prevents me from lifting heavy weights, but I can manage light to medium weights if they are conveniently positioned 3 = Pain prevents me from lifting heavy weights, but I can manage light to medium weights if they are conveniently positioned  4. Walking 3 =  Pain prevents me from walking more than  mile. 2 =  Pain prevents me from walking more than 1/2 mile.  5. Sitting 2 =  Pain prevents me from sitting more than 1 hour. 0 = I can sit in any chair as long as I like  6.  Standing 3 =  Pain prevents me from standing more than 1/2 hour. 1 =  I can stand as long as I want to but it increases my pain  7. Sleeping 0 = Pain does not prevent me from sleeping well. 0 = Pain does not prevent me from sleeping well.  8. Social Life 0 = My social life is normal and does not increase my pain. 2 =  Pain prevents me from participating in more energetic  activities  9. Traveling 1 =  I can travel anywhere, but it increases my pain. 1 =  I can travel anywhere, but it increases my pain.  10. Employment/ Homemaking 3 = Pain prevents me from doing anything but light duties. 2 = I can perform most of my homemaking/job duties, but pain prevents mw from performing more physically stressful activities  Total 17/50 11/50   Interpretation of scores: Score Category Description  0-20% Minimal Disability The patient can cope with most living activities. Usually no treatment is indicated apart from advice on lifting, sitting and exercise  21-40% Moderate Disability The patient experiences more pain and difficulty with sitting, lifting and standing. Travel and social life are more difficult and they may be disabled from work. Personal care, sexual activity and sleeping are not grossly affected, and the patient can usually be managed by conservative means  41-60% Severe Disability Pain remains the main problem in this group, but activities of daily living are affected. These patients require a detailed investigation  61-80% Crippled Back pain impinges on all aspects of the patients life. Positive intervention is required  81-100% Bed-bound These patients are either bed-bound or exaggerating their symptoms  Bluford FORBES Zoe DELENA Karon DELENA, et al. Surgery versus conservative management of stable thoracolumbar fracture: the PRESTO feasibility RCT. [place] ([place]): [employer]; 2021 Nov. [facility] Technology Assessment, No. 25.62.) Appendix 3, Oswestry Disability Index category descriptors.  Available from: [email or web address]  Minimally Clinically Important Difference (MCID) = 12.8%  COGNITION: Overall cognitive status: Within functional limits for tasks assessed     POSTURE: rounded shoulders, forward head, increased thoracic kyphosis, and weight shift left  PALPATION: TTP at T7 and L1-L5  LUMBAR ROM:   AROM eval  Flexion WFL dis  Extension WFL pain   Right lateral flexion Limited 50 % discomfort   Left lateral flexion Limited 50%pain   Right rotation WFL  Left rotation WFL   (Blank rows = not tested)  LOWER EXTREMITY ROM:     Active  Right eval Left eval  Hip flexion    Hip extension    Hip abduction    Hip adduction    Hip internal rotation    Hip external rotation    Knee flexion    Knee extension    Ankle dorsiflexion    Ankle plantarflexion    Ankle inversion    Ankle eversion     (Blank rows = not tested)  LOWER EXTREMITY MMT:    MMT Right eval Left eval Right 01/28/24 Left 01/28/24  Hip flexion 4 4 4+ 4  Hip extension 3+ 3+ 4- 4-  Hip abduction 4- 3 4+ 4-   Hip adduction      Hip internal rotation 4 4    Hip external rotation 4+ 4+    Knee flexion 4- 4- 4+ 4+  Knee extension 4- pain  4- pain 4+ 4+  Ankle dorsiflexion      Ankle plantarflexion      Ankle inversion      Ankle eversion       (Blank rows = not tested)  LUMBAR SPECIAL TESTS:  Slump test: Negative and Single leg stance test: Positive  FUNCTIONAL TESTS:  5 times sit to stand: 18.30 sec   GAIT: Distance walked: 100 ft Assistive device utilized: None Level of assistance: Complete Independence Comments: lateral trunk lean to the left    TREATMENT DATE:  [facility] Adult PT Treatment:  DATE: 01/28/2024 Therapeutic Exercise: Hip/knee MMT (see above) LTR Side lying open book with top leg propped on foam roller Supine adductor stretch Seated red PB roll out front & diagonals Therapeutic  Activity: Modified Oswestry Sit to stand holding two 3#DB x10  Sit to stand + overhead reach with 3#DB x 10 (needed rest breaks) Modified dead lift 5# x 10    [facility] Adult PT Treatment:                                                DATE: 01/26/24 Therapeutic Exercise: Quadruped rock back stretch Cat/cow Seated HS stretch Seated figure 4 stretch Seated QL stretch HEP review  Therapeutic Activity: Modified dead lift 5# x 10 Resisted walking 12.5# backward, 10# forward Pallof step out green TB Suitcase carry 5# x 2 laps Sit <> stand without UE support 2 x 10 Pelvic tilts seated   [facility] Adult PT Treatment:                                                DATE: 01/21/2024 Therapeutic Exercise: Quadruped rock back stretch Cat/cow  LTR x 1 min --> wide leg hip IR/ER x 1 min Side lying open books 10 x 3 sec: Pillow under bottom hip Bottom leg straight Top leg bent and resting on soft foam roller Hand behind head Neuromuscular re-ed: Bridges 10 x 3 sec Supine marching + abdominal bracing engagement  Marching bridges Therapeutic Activity: Seated hip hinge dead lift with foam roller (rolling down/up shins) Seated dead lift + 5#KB Seated trunk extension with blue TB (therapist holding band)  Walking + 5#KB farmer carry x 160                                                                                                                     PATIENT EDUCATION:  Education details: Updated HEP  Person educated: Patient Education method: Programmer, Multimedia, Demonstration, Actor cues, Verbal cues, and Handouts Education comprehension: verbalized understanding, returned demonstration, verbal cues required, tactile cues required, and needs further education  HOME EXERCISE PROGRAM: Access Code: [email or web address] URL: [email or web address] Date: 01/28/2024 Prepared by: Lamarr Price  Exercises - Sit to Stand Without Arm Support  - 1 x daily - 7 x weekly - 3 sets - 5 reps - Heel Raises  with Counter Support  - 1 x daily - 7 x weekly - 2 sets - 10 reps - Standing Lumbar Extension with Counter  - 2 x daily - 7 x weekly - 1 sets - 3-5 reps - 10-20 sec hold - Single Leg Stance  - 2 x daily - 7 x weekly - 1 sets - 3 reps - 20 -30 sec hold - Sidelying Open Book Thoracic Lumbar Rotation and  Extension  - 2 x daily - 7 x weekly - 1 sets - 10 reps - 3 sec hold - Seated Figure 4 Piriformis Stretch  - 1 x daily - 7 x weekly - 1 sets - 3 reps - 30 sec hold - Seated Quadratus Lumborum Stretch in Chair  - 1 x daily - 7 x weekly - 1 sets - 10 reps - 10 sec hold - Pallof Press With Lateral Step  - 1 x daily - 7 x weekly - 3 sets - 10 reps - Half Deadlift with Kettlebell  - 1 x daily - 7 x weekly - 1 sets - 10 reps - Supine Butterfly Groin Stretch  - 2 x daily - 7 x weekly - 1 sets - 3-5 reps - 30 sec hold  ASSESSMENT:  CLINICAL IMPRESSION: Patient has met all LTGs in plan of care and is independent with home exercise plan. Supine adductor stretch added to HEP to address Lt groin tightness/tension. Patient is agreeable to discharge today.   OBJECTIVE IMPAIRMENTS: Abnormal gait, decreased activity tolerance, decreased balance, decreased coordination, decreased endurance, decreased knowledge of condition, decreased mobility, difficulty walking, decreased ROM, decreased strength, hypomobility, increased fascial restrictions, increased muscle spasms, impaired flexibility, impaired UE functional use, improper body mechanics, postural dysfunction, and pain.     GOALS: Goals reviewed with patient? Yes  SHORT TERM GOALS: Target date: 01/15/24  Pt will be competent in initial HEP so she has increased independence for everyday activities Baseline: Goal status: MET  2.  Pt will report 4/10 or less so she is able to complete household chores and all ADL's independently/safely.  Baseline: 6/10 Goal status: MET  3.  Pt will demonstrate proper form technique for dead lifting so she can pick objects  off the floor pain free and safely.  Baseline: unable to without pain  Goal status: MET    LONG TERM GOALS: Target date: 02/09/23  Pt will be competent in advanced HEP so she has increased independence for everyday activities Baseline:  Goal status: MET  2.  Pt will score 13% lower on Modified Oswestry scale, demonstrating decreased disability and increased functional capacity for house hold chores/ADLs.  Baseline: 34% (17/50) 01/28/24: 22% (11/50) Goal status: MET  3.  Pt will report ability to walk 1/2 a mile without low back pain and without rest breaks Baseline: unable 01/28/24: able to walk 1/2 mile - fatigue but able to do Goal status: MET   PLAN:  PT FREQUENCY: 1-2x/week  PT DURATION: 8 weeks  PLANNED INTERVENTIONS: 97164- PT Re-evaluation, 97110-Therapeutic exercises, 97530- Therapeutic activity, 97112- Neuromuscular re-education, 97535- Self Care, [id number]- Manual therapy, [id number]- Gait training, [phone]- Electrical stimulation (manual), [phone] (1-2 muscles), [phone] (3+ muscles)- Dry Needling, Patient/Family education, Balance training, Stair training, Joint mobilization, Spinal mobilization, Cryotherapy, and Moist heat.  PLAN FOR NEXT SESSION: D/C   Lamarr GORMAN Price, PTA 01/28/2024, 9:38 AM  "

## 2024-03-09 ENCOUNTER — Other Ambulatory Visit

## 2024-11-15 ENCOUNTER — Ambulatory Visit
